# Patient Record
Sex: Male | Born: 1960 | Race: White | Hispanic: No | Smoking: Former smoker
Health system: Southern US, Community
[De-identification: ages and names within clinical notes are randomized; demographics above are authoritative.]

## PROBLEM LIST (undated history)

## (undated) DIAGNOSIS — R569 Unspecified convulsions: Secondary | ICD-10-CM

## (undated) DIAGNOSIS — F419 Anxiety disorder, unspecified: Secondary | ICD-10-CM

## (undated) DIAGNOSIS — Z720 Tobacco use: Secondary | ICD-10-CM

## (undated) DIAGNOSIS — J189 Pneumonia, unspecified organism: Secondary | ICD-10-CM

## (undated) DIAGNOSIS — R5382 Chronic fatigue, unspecified: Secondary | ICD-10-CM

## (undated) DIAGNOSIS — J40 Bronchitis, not specified as acute or chronic: Secondary | ICD-10-CM

## (undated) DIAGNOSIS — IMO0001 Reserved for inherently not codable concepts without codable children: Secondary | ICD-10-CM

## (undated) DIAGNOSIS — F329 Major depressive disorder, single episode, unspecified: Secondary | ICD-10-CM

## (undated) DIAGNOSIS — I10 Essential (primary) hypertension: Secondary | ICD-10-CM

## (undated) DIAGNOSIS — C3490 Malignant neoplasm of unspecified part of unspecified bronchus or lung: Secondary | ICD-10-CM

## (undated) HISTORY — PX: VASECTOMY: SHX75

## (undated) HISTORY — PX: HERNIA REPAIR: SHX51

## (undated) HISTORY — DX: Malignant neoplasm of unspecified part of unspecified bronchus or lung: C34.90

## (undated) HISTORY — DX: Chronic fatigue, unspecified: R53.82

## (undated) HISTORY — DX: Major depressive disorder, single episode, unspecified: F32.9

## (undated) HISTORY — DX: Essential (primary) hypertension: I10

---

## 2014-12-12 ENCOUNTER — Emergency Department (HOSPITAL_COMMUNITY): Payer: Managed Care, Other (non HMO)

## 2014-12-12 ENCOUNTER — Inpatient Hospital Stay (HOSPITAL_COMMUNITY)
Admission: EM | Admit: 2014-12-12 | Discharge: 2014-12-16 | DRG: 987 | Disposition: A | Discharge status: Home / Self Care | Payer: Managed Care, Other (non HMO) | Attending: Internal Medicine | Admitting: Internal Medicine

## 2014-12-12 ENCOUNTER — Encounter (HOSPITAL_COMMUNITY): Payer: Self-pay | Admitting: Family Medicine

## 2014-12-12 DIAGNOSIS — R634 Abnormal weight loss: Secondary | ICD-10-CM | POA: Diagnosis present

## 2014-12-12 DIAGNOSIS — R918 Other nonspecific abnormal finding of lung field: Secondary | ICD-10-CM | POA: Diagnosis not present

## 2014-12-12 DIAGNOSIS — Z6821 Body mass index (BMI) 21.0-21.9, adult: Secondary | ICD-10-CM

## 2014-12-12 DIAGNOSIS — F17210 Nicotine dependence, cigarettes, uncomplicated: Secondary | ICD-10-CM | POA: Diagnosis present

## 2014-12-12 DIAGNOSIS — R531 Weakness: Secondary | ICD-10-CM | POA: Diagnosis present

## 2014-12-12 DIAGNOSIS — Z720 Tobacco use: Secondary | ICD-10-CM | POA: Diagnosis not present

## 2014-12-12 DIAGNOSIS — G4089 Other seizures: Secondary | ICD-10-CM | POA: Diagnosis present

## 2014-12-12 DIAGNOSIS — C7931 Secondary malignant neoplasm of brain: Secondary | ICD-10-CM | POA: Diagnosis present

## 2014-12-12 DIAGNOSIS — C3432 Malignant neoplasm of lower lobe, left bronchus or lung: Secondary | ICD-10-CM | POA: Diagnosis present

## 2014-12-12 DIAGNOSIS — G936 Cerebral edema: Secondary | ICD-10-CM | POA: Diagnosis present

## 2014-12-12 DIAGNOSIS — F411 Generalized anxiety disorder: Secondary | ICD-10-CM | POA: Diagnosis not present

## 2014-12-12 DIAGNOSIS — R911 Solitary pulmonary nodule: Secondary | ICD-10-CM | POA: Diagnosis not present

## 2014-12-12 DIAGNOSIS — G8191 Hemiplegia, unspecified affecting right dominant side: Secondary | ICD-10-CM | POA: Diagnosis present

## 2014-12-12 DIAGNOSIS — G939 Disorder of brain, unspecified: Secondary | ICD-10-CM | POA: Diagnosis not present

## 2014-12-12 DIAGNOSIS — F17200 Nicotine dependence, unspecified, uncomplicated: Secondary | ICD-10-CM | POA: Diagnosis not present

## 2014-12-12 DIAGNOSIS — Z9889 Other specified postprocedural states: Secondary | ICD-10-CM

## 2014-12-12 DIAGNOSIS — M6289 Other specified disorders of muscle: Secondary | ICD-10-CM | POA: Diagnosis not present

## 2014-12-12 LAB — CBC
HCT: 46.3 % (ref 39.0–52.0)
HEMOGLOBIN: 16 g/dL (ref 13.0–17.0)
MCH: 34 pg (ref 26.0–34.0)
MCHC: 34.6 g/dL (ref 30.0–36.0)
MCV: 98.5 fL (ref 78.0–100.0)
Platelets: 290 10*3/uL (ref 150–400)
RBC: 4.7 MIL/uL (ref 4.22–5.81)
RDW: 13.9 % (ref 11.5–15.5)
WBC: 10.3 10*3/uL (ref 4.0–10.5)

## 2014-12-12 LAB — COMPREHENSIVE METABOLIC PANEL
ALBUMIN: 3.7 g/dL (ref 3.5–5.0)
ALK PHOS: 80 U/L (ref 38–126)
ALT: 22 U/L (ref 17–63)
ANION GAP: 8 (ref 5–15)
AST: 34 U/L (ref 15–41)
BILIRUBIN TOTAL: 0.7 mg/dL (ref 0.3–1.2)
BUN: 14 mg/dL (ref 6–20)
CALCIUM: 9.1 mg/dL (ref 8.9–10.3)
CO2: 26 mmol/L (ref 22–32)
Chloride: 104 mmol/L (ref 101–111)
Creatinine, Ser: 0.91 mg/dL (ref 0.61–1.24)
Glucose, Bld: 106 mg/dL — ABNORMAL HIGH (ref 65–99)
POTASSIUM: 4.1 mmol/L (ref 3.5–5.1)
Sodium: 138 mmol/L (ref 135–145)
TOTAL PROTEIN: 6.8 g/dL (ref 6.5–8.1)

## 2014-12-12 LAB — DIFFERENTIAL
Basophils Absolute: 0 10*3/uL (ref 0.0–0.1)
Basophils Relative: 0 %
EOS ABS: 0.2 10*3/uL (ref 0.0–0.7)
EOS PCT: 2 %
LYMPHS ABS: 3 10*3/uL (ref 0.7–4.0)
LYMPHS PCT: 29 %
MONO ABS: 1 10*3/uL (ref 0.1–1.0)
MONOS PCT: 10 %
NEUTROS PCT: 59 %
Neutro Abs: 6.1 10*3/uL (ref 1.7–7.7)

## 2014-12-12 LAB — PROTIME-INR
INR: 0.96 (ref 0.00–1.49)
Prothrombin Time: 13 seconds (ref 11.6–15.2)

## 2014-12-12 LAB — APTT: aPTT: 27 seconds (ref 24–37)

## 2014-12-12 MED ORDER — SODIUM CHLORIDE 0.9 % IJ SOLN
3.0000 mL | Freq: Two times a day (BID) | INTRAMUSCULAR | Status: DC
  Administered 2014-12-13 – 2014-12-15 (×7): 3 mL via INTRAVENOUS

## 2014-12-12 MED ORDER — DEXAMETHASONE SODIUM PHOSPHATE 10 MG/ML IJ SOLN
4.0000 mg | Freq: Four times a day (QID) | INTRAMUSCULAR | Status: DC
  Administered 2014-12-13 – 2014-12-16 (×14): 4 mg via INTRAVENOUS
  Filled 2014-12-12 (×14): qty 1

## 2014-12-12 MED ORDER — ACETAMINOPHEN 650 MG RE SUPP: 650.0000 mg | Freq: Four times a day (QID) | RECTAL | Status: DC | PRN

## 2014-12-12 MED ORDER — LORAZEPAM 2 MG/ML IJ SOLN
1.0000 mg | Freq: Once | INTRAMUSCULAR | Status: AC
  Administered 2014-12-12: 1 mg via INTRAVENOUS
  Filled 2014-12-12: qty 1

## 2014-12-12 MED ORDER — ONDANSETRON HCL 4 MG/2ML IJ SOLN: 4.0000 mg | Freq: Four times a day (QID) | INTRAMUSCULAR | Status: DC | PRN

## 2014-12-12 MED ORDER — DEXAMETHASONE SODIUM PHOSPHATE 10 MG/ML IJ SOLN
10.0000 mg | Freq: Once | INTRAMUSCULAR | Status: AC
  Administered 2014-12-12: 10 mg via INTRAVENOUS
  Filled 2014-12-12: qty 1

## 2014-12-12 MED ORDER — PNEUMOCOCCAL VAC POLYVALENT 25 MCG/0.5ML IJ INJ
0.5000 mL | INJECTION | INTRAMUSCULAR | Status: DC
  Filled 2014-12-12: qty 0.5

## 2014-12-12 MED ORDER — NICOTINE 21 MG/24HR TD PT24
21.0000 mg | MEDICATED_PATCH | Freq: Every day | TRANSDERMAL | Status: DC
  Administered 2014-12-13 – 2014-12-16 (×5): 21 mg via TRANSDERMAL
  Filled 2014-12-12 (×5): qty 1

## 2014-12-12 MED ORDER — BARIUM SULFATE 2.1 % PO SUSP
900.0000 mL | Freq: Once | ORAL | Status: AC
  Administered 2014-12-13: 900 mL via ORAL

## 2014-12-12 MED ORDER — ONDANSETRON HCL 4 MG PO TABS: 4.0000 mg | ORAL_TABLET | Freq: Four times a day (QID) | ORAL | Status: DC | PRN

## 2014-12-12 MED ORDER — SODIUM CHLORIDE 0.9 % IV SOLN
1000.0000 mg | Freq: Once | INTRAVENOUS | Status: AC
  Administered 2014-12-12: 1000 mg via INTRAVENOUS
  Filled 2014-12-12: qty 10

## 2014-12-12 MED ORDER — LEVETIRACETAM 750 MG PO TABS
750.0000 mg | ORAL_TABLET | Freq: Two times a day (BID) | ORAL | Status: DC
  Administered 2014-12-13 – 2014-12-16 (×8): 750 mg via ORAL
  Filled 2014-12-12 (×8): qty 1

## 2014-12-12 MED ORDER — ACETAMINOPHEN 325 MG PO TABS: 650.0000 mg | ORAL_TABLET | Freq: Four times a day (QID) | ORAL | Status: DC | PRN

## 2014-12-12 MED ORDER — INFLUENZA VAC SPLIT QUAD 0.5 ML IM SUSY
0.5000 mL | PREFILLED_SYRINGE | INTRAMUSCULAR | Status: DC
  Filled 2014-12-12: qty 0.5

## 2014-12-12 MED ORDER — BARIUM SULFATE 2.1 % PO SUSP
ORAL | Status: AC
  Administered 2014-12-13: 02:00:00
  Filled 2014-12-12: qty 2

## 2014-12-12 MED ORDER — GADOBENATE DIMEGLUMINE 529 MG/ML IV SOLN
15.0000 mL | Freq: Once | INTRAVENOUS | Status: AC | PRN
  Administered 2014-12-12: 15 mL via INTRAVENOUS

## 2014-12-12 NOTE — H&P (Signed)
PCP: Sadie Haber at [facility]   Referring provider: Billy Fischer   Chief Complaint:  Right side weakness  HPI: Timothy Obrien is a 54 y.o. male   has no past medical history on file.   Presented with  His morning he had at least 2 minute long episode during which she had weakness of his right arm and leg. This occurred at around 1:30 AM. He was getting out of the shower at the time and his right leg would not hold his weight he colupsed on the floor. He has felt that his leg has been dragging somewhat for few weeks. Of note he reports that even a week ago he had somewhat similar episode but resolved. He did notice in the past that he may have some mild weakness especially pronounced when he was walking stairs he also has endorsed some tremors mainly involving his right arm and leg this was on November 21. Patient is a smoker. Denies any fevers nausea vomiting chest pain shortness of breath. Patient originally presented to emergency department for evaluation for possible stroke CT scan of the head showed large left frontal lobe low density mass with surrounding visit genic edema. Mostly consistent ciliated metastasis. There was no evidence of stroke. 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 Neurosurgery has been consulted recommends initiation of Keppra after 1 g IV bolus and started on 750 mg by mouth twice a day and Decadron 10 mg IV once than 4 mg every 6 hours.  Reports no colonoscopy. Patient had some loss of appetite for few months with 20 lb weight loss thought it was due to being layed off. He has mild chronic cough. 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  Review of Systems:    Pertinent positives include: Right-sided weakness, weight loss 20 pounds  Constitutional:  No weight loss, night sweats, Fevers, chills, fatigue,   HEENT:  No headaches,  Difficulty swallowing,Tooth/dental problems,Sore throat,  No sneezing, itching, ear ache, nasal congestion, post nasal drip,  Cardio-vascular:  No chest pain, Orthopnea, PND, anasarca, dizziness, palpitations.no Bilateral lower extremity swelling  GI:  No heartburn, indigestion, abdominal pain, nausea, vomiting, diarrhea, change in bowel habits, loss of appetite, melena, blood in stool, hematemesis Resp:  no shortness of breath at rest. No dyspnea on exertion, No excess mucus, no productive cough, No non-productive cough, No coughing up of blood.No change in color of mucus.No wheezing. Skin:  no rash or lesions. No jaundice GU:  no dysuria, change in color of urine, no urgency or frequency. No straining to urinate.  No flank pain.  Musculoskeletal:  No joint pain or no joint swelling. No decreased range of motion. No back pain.  Psych:  No change in mood or affect. No depression or anxiety. No memory loss.  Neuro: no localizing neurological complaints, no tingling, no weakness, no double vision, no gait abnormality, no slurred speech, no confusion  Otherwise ROS are negative except for above, 10 systems were reviewed  Past Medical History: History reviewed. No pertinent past medical history. Past Surgical History  Procedure Laterality Date  . Hernia repair       Medications: Prior to Admission medications   Medication Sig Start Date End Date Taking? Authorizing Provider  ibuprofen (ADVIL,MOTRIN) 200 MG tablet Take 400 mg by mouth every 6 (six) hours as needed.   Yes Historical Provider, MD  Allergies:  No Known Allergies  Social History:  Ambulatory   Independently  Lives at home  alone     reports that he has been smoking Cigarettes.  He does not have any smokeless tobacco history on file. He reports that he drinks alcohol. He reports that he does not use illicit drugs.    Family History: family history includes Arthritis in his father; Breast cancer in his mother.      Physical Exam: Patient Vitals for the past 24 hrs:  BP Temp Pulse Resp SpO2 Height Weight  12/12/14 1930 119/93 mmHg - 84 17 95 % - -  12/12/14 1915 109/83 mmHg - 67 18 95 % - -  12/12/14 1912 107/79 mmHg - 67 21 95 % - -  12/12/14 1900 113/74 mmHg - 69 23 94 % - -  12/12/14 1815 121/82 mmHg - - 20 95 % - -  12/12/14 1645 114/65 mmHg - 72 21 92 % - -  12/12/14 1630 118/76 mmHg - 75 23 90 % - -  12/12/14 1615 125/81 mmHg - 78 14 96 % - -  12/12/14 1600 125/75 mmHg - 74 18 94 % - -  12/12/14 1554 124/84 mmHg - 77 18 95 % - -  12/12/14 1311 124/87 mmHg 97.9 F (36.6 C) 91 18 98 % 6' (1.829 m) 71.923 kg (158 lb 9 oz)    1. General:  in No Acute distress 2. Psychological: Alert and  Oriented 3. Head/ENT:    Dry Mucous Membranes                          Head Non traumatic, neck supple                          Normal   Dentition 4. SKIN:  decreased Skin turgor,  Skin clean Dry and intact no rash 5. Heart: Regular rate and rhythm no Murmur, Rub or gallop 6. Lungs:  no wheezes some crackles  diminished at the bases 7. Abdomen: Soft, non-tender, Non distended 8. Lower extremities: no clubbing, cyanosis, or edema 9. Neurologically somewhat decreased strength on the right cranial nerves II through XII intact. Right pronator drift.  10. MSK: Normal range of motion  body mass index is 21.5 kg/(m^2).   Labs on Admission:   Results for orders placed or performed during the hospital encounter of 12/12/14 (from the past 24 hour(s))  Protime-INR     Status: None   Collection Time: 12/12/14  2:59 PM  Result Value Ref Range   Prothrombin Time 13.0 11.6 - 15.2 seconds   INR 0.96 0.00 - 1.49  APTT     Status: None   Collection Time: 12/12/14  2:59 PM  Result Value Ref Range   aPTT 27 24 - 37 seconds  CBC     Status: None   Collection Time: 12/12/14  2:59 PM  Result Value Ref Range   WBC 10.3 4.0 - 10.5 K/uL   RBC 4.70 4.22 - 5.81 MIL/uL   Hemoglobin 16.0 13.0 - 17.0 g/dL   HCT 46.3  39.0 - 52.0 %   MCV 98.5 78.0 - 100.0 fL   MCH 34.0 26.0 - 34.0 pg   MCHC 34.6 30.0 - 36.0 g/dL   RDW 13.9 11.5 - 15.5 %   Platelets 290 150 - 400 K/uL  Differential     Status: None   Collection Time: 12/12/14  2:59 PM  Result Value Ref Range   Neutrophils Relative % 59 %   Neutro Abs 6.1 1.7 - 7.7 K/uL   Lymphocytes Relative 29 %   Lymphs Abs 3.0 0.7 - 4.0 K/uL   Monocytes Relative 10 %   Monocytes Absolute 1.0 0.1 - 1.0 K/uL   Eosinophils Relative 2 %   Eosinophils Absolute 0.2 0.0 - 0.7 K/uL   Basophils Relative 0 %   Basophils Absolute 0.0 0.0 - 0.1 K/uL  Comprehensive metabolic panel     Status: Abnormal   Collection Time: 12/12/14  2:59 PM  Result Value Ref Range   Sodium 138 135 - 145 mmol/L   Potassium 4.1 3.5 - 5.1 mmol/L   Chloride 104 101 - 111 mmol/L   CO2 26 22 - 32 mmol/L   Glucose, Bld 106 (H) 65 - 99 mg/dL   BUN 14 6 - 20 mg/dL   Creatinine, Ser 0.91 0.61 - 1.24 mg/dL   Calcium 9.1 8.9 - 10.3 mg/dL   Total Protein 6.8 6.5 - 8.1 g/dL   Albumin 3.7 3.5 - 5.0 g/dL   AST 34 15 - 41 U/L   ALT 22 17 - 63 U/L   Alkaline Phosphatase 80 38 - 126 U/L   Total Bilirubin 0.7 0.3 - 1.2 mg/dL   GFR calc non Af Amer >60 >60 mL/min   GFR calc Af Amer >60 >60 mL/min   Anion gap 8 5 - 15    UA not obtained  No results found for: HGBA1C  Estimated Creatinine Clearance: 96.6 mL/min (by C-G formula based on Cr of 0.91).  BNP (last 3 results) No results for input(s): PROBNP in the last 8760 hours.  Other results:  I have pearsonaly reviewed this: ECG REPORT  Rate: 77  Rhythm: Sinus rhythm ST&T Change: No ischemic changes QTC 439  Filed Weights   12/12/14 1311  Weight: 71.923 kg (158 lb 9 oz)     Cultures: No results found for: SDES, SPECREQUEST, CULT, REPTSTATUS   Radiological Exams on Admission: Ct Head Wo Contrast  12/12/2014  CLINICAL DATA:  54 year old with right arm and leg numbness and weakness today. Initial encounter. EXAM: CT HEAD WITHOUT  CONTRAST TECHNIQUE: Contiguous axial images were obtained from the base of the skull through the vertex without intravenous contrast. COMPARISON:  None. FINDINGS: There is a 3.0 x 2.7 cm well-circumscribed low-density left frontal lobe mass with moderate surrounding vasogenic edema. This mass demonstrates no evidence of hemorrhage or calcification. There is no associated midline shift or hydrocephalus. No other masses are identified. There is no evidence of acute stroke. The visualized paranasal sinuses, mastoid air cells and middle ears are otherwise clear. The calvarium is intact. IMPRESSION: 1. Large left frontal lobe low-density mass with surrounding vasogenic edema. This appearance is most consistent with an isolated metastasis. Additional considerations include primary brain tumor and cerebral abscess. 2. No evidence of acute intracranial hemorrhage, midline shift, hydrocephalus or acute stroke. 3. Further evaluation with MRI without and with contrast recommended. 4. These results were called by telephone at the time of interpretation on 12/12/2014 at 2:00 pm to Dr. Sherwood Gambler , who verbally acknowledged these results. Electronically Signed   By: Richardean Sale M.D.   On: 12/12/2014 14:07   Mr [name] AT Contrast  12/12/2014  CLINICAL DATA:  Transient right-sided symptoms which have become worse, now with right-sided weakness. History of right-sided twitching. EXAM: MRI HEAD WITHOUT AND WITH CONTRAST TECHNIQUE: Multiplanar, multiecho pulse sequences of  the brain and surrounding structures were obtained without and with intravenous contrast. CONTRAST:  40m MULTIHANCE GADOBENATE DIMEGLUMINE 529 MG/ML IV SOLN COMPARISON:  CT head earlier today. FINDINGS: A superficial, LEFT posterior frontal intra-axial mass is redemonstrated. Measurements are 30 x 32 x 36 mm (R-L x A-P x C-C). The lesion shows no diffusion restriction, and no significant susceptibility except perhaps a tiny area on its most superficial  aspect. The lesion is T2 and FLAIR hyperintense, displays prominent T1 shortening, and is surrounded by a thin but somewhat irregular enhancing rim. There is mild vasogenic edema. There is no significant left-to-right shift.No other similar lesions are seen. Otherwise normal-appearing brain. Normal cerebral volume without significant white matter disease. Flow voids are maintained throughout the major intracranial vessels. No midline abnormality. No osseous findings. Extracranial soft tissues unremarkable. IMPRESSION: LEFT posterior frontal superficial intra-axial mass with peripheral enhancement, measuring 30 x 32 x 36 mm. The lesion appears cystic on CT, but displays prominent T1 shortening centrally on MR, suggesting mucin or proteinaceous content. No diffusion restriction to suggest abscess. A solitary metastasis is favored, possibly of endocrine or gastrointestinal origin. Given the lack of susceptibility on gradient sequence, melanoma is less likely but not excluded. Electronically Signed   By: JStaci RighterM.D.   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   Present on Admission:  . Metastasis to brain ([facility] - will need father workup for primary source obtain CT abdomen and chest and pelvis.  Possible source as well as most appropriate biopsy site. Continue dexamethasone 4 milligrams every 6 hours and Keppra 750 mg twice a day  . Right sided weakness -PT/OT evaluation and treatment once stable Alcohol use heavy no hx of withdrawal but would monitor Tobacco abuse - nicotine patch, discussed importance of quiting Prophylaxis: SCD   CODE STATUS:  FULL CODE   as per patient    Disposition: To home once workup is complete and patient is stable   Other plan as per orders.  I have spent a total of 55 min on this admission  Ishmel Acevedo 12/12/2014, 7:46 PM  [facility]  Pager [phone]  after 2 AM please page floor coverage PA If 7AM-7PM, please contact the day team taking care of the patient  [email or web address]  Password [email or web address]

## 2014-12-12 NOTE — Consult Note (Signed)
CC:  Chief Complaint  Patient presents with  . Numbness  . Extremity Weakness    HPI: Timothy Obrien is a 54 y.o. male male who had a simple partial seizure affecting his right arm and leg last night.  He has recovered but still is subtly weak on that side.  He hasn't had any headaches.  No visual changes.  No speech or language problems.  He is a smoker and has lost 20 lbs in the past several months.  PMH: History reviewed. No pertinent past medical history.  PSH: Past Surgical History  Procedure Laterality Date  . Hernia repair      SH: Social History  Substance Use Topics  . Smoking status: Current Every Day Smoker    Types: Cigarettes  . Smokeless tobacco: None  . Alcohol Use: Yes    MEDS: Prior to Admission medications   Medication Sig Start Date End Date Taking? Authorizing Provider  ibuprofen (ADVIL,MOTRIN) 200 MG tablet Take 400 mg by mouth every 6 (six) hours as needed.   Yes Historical Provider, MD    ALLERGY: No Known Allergies  ROS: ROS  NEUROLOGIC EXAM: Awake, alert, oriented Memory and concentration grossly intact Speech fluent, appropriate CN grossly intact Motor exam: Upper Extremities Deltoid Bicep Tricep Grip  Right 4/5 4/5 4/5 4/5  Left 5/5 5/5 5/5 5/5   Lower Extremity IP Quad PF DF EHL  Right 4/5 4/5 4/5 4/5 4/5  Left 5/5 5/5 5/5 5/5 5/5   Sensation grossly intact to LT  IMGAING: CT Head: Left frontal cystic mass with vasogenic edema MRI Brain: Left frontal cystic mass with rim enhancement.  No DWI restriction to suggest abscess.  IMPRESSION: - 54 y.o. male with left frontal cystic mass and right hemiparesis.  I suspect this is a metastatic lesion.  I explained the patient and his wife.  PLAN: - Admit to Hospitalist for CT Chest/Abd/Pelvis with contrast.  If primary identified then recommend biopsy. - Will plan resection vs. Radiation vs. Both depending on results of CT. - Dexamethasone 10 mg IV now then 4q6. - Keppra 750  mg PO/IV bid

## 2014-12-12 NOTE — ED Notes (Signed)
Pt back from MRI. Neuro at bedside.

## 2014-12-12 NOTE — ED Notes (Signed)
Pt taken to MRI  

## 2014-12-12 NOTE — ED Notes (Signed)
Dr. Sherwood Gambler at bedside

## 2014-12-12 NOTE — ED Notes (Addendum)
Pt here for 2 minute episode of right arm and leg weakness around 1:30 am this morning. sts he collapsed and felt pins an needles on right side. sts today feels like his right leg may be dragging. sts similar episode last week while traveling to his moms for thanksgiving.

## 2014-12-12 NOTE — ED Notes (Signed)
Attempted to call report

## 2014-12-12 NOTE — ED Provider Notes (Signed)
CSN: [id number]     Arrival date & time 12/12/14  1258 History   First MD Initiated Contact with Patient 12/12/14 1424     Chief Complaint  Patient presents with  . Numbness  . Extremity Weakness     (Consider location/radiation/quality/duration/timing/severity/associated sxs/prior Treatment) HPI  54 year old male presents with a concern for a TIA. Last night he was getting up out of the bathroom and fell and was unable to get off of the floor due to right arm and leg weakness. This seemed to last only a few minutes and finally he was able to get up. However today he has noticed pins and needles on his right side as well as that his right leg seems to be dragging. Denies any headaches. Now the family lives back on a he may have had some mild weakness, especially when going up stairs over the last week or so. When he was driving to his moms house for Thanksgiving one week ago he noticed some uncontrollable shaking in his right arm and leg. This lasted about one-2 minutes. Denies any sort of medical history. No fevers. No left-sided symptoms.  History reviewed. No pertinent past medical history. Past Surgical History  Procedure Laterality Date  . Hernia repair     History reviewed. No pertinent family history. Social History  Substance Use Topics  . Smoking status: Current Every Day Smoker    Types: Cigarettes  . Smokeless tobacco: None  . Alcohol Use: Yes    Review of Systems  Eyes: Negative for visual disturbance.  Respiratory: Negative for shortness of breath.   Cardiovascular: Negative for chest pain.  Gastrointestinal: Negative for vomiting.  Neurological: Positive for weakness and numbness. Negative for dizziness, syncope and headaches.  All other systems reviewed and are negative.     Allergies  Review of patient's allergies indicates no known allergies.  Home Medications   Prior to Admission medications   Not on File   BP 124/87 mmHg  Pulse 91  Temp(Src) 97.9 F  (36.6 C)  Resp 18  Ht 6' (1.829 m)  Wt 158 lb 9 oz (71.923 kg)  BMI 21.50 kg/m2  SpO2 98% Physical Exam  Constitutional: He is oriented to person, place, and time. He appears well-developed and well-nourished.  HENT:  Head: Normocephalic and atraumatic.  Right Ear: External ear normal.  Left Ear: External ear normal.  Nose: Nose normal.  Eyes: EOM are normal. Pupils are equal, round, and reactive to light. Right eye exhibits no discharge. Left eye exhibits no discharge.  Neck: Neck supple.  Cardiovascular: Normal rate, regular rhythm, normal heart sounds and intact distal pulses.   Pulmonary/Chest: Effort normal and breath sounds normal.  Abdominal: Soft. There is no tenderness.  Musculoskeletal: He exhibits no edema.  Neurological: He is alert and oriented to person, place, and time.  CN 2-12 grossly intact. 5/5 strength in all 4 extremities but slightly weaker right sided.  Skin: Skin is warm and dry.  Nursing note and vitals reviewed.   ED Course  Procedures (including critical care time) Labs Review Labs Reviewed  COMPREHENSIVE METABOLIC PANEL - Abnormal; Notable for the following:    Glucose, Bld 106 (*)    All other components within normal limits  PROTIME-INR  APTT  CBC  DIFFERENTIAL  I-STAT TROPOININ, ED  I-STAT CHEM 8, ED  CBG MONITORING, ED    Imaging Review Ct Head Wo Contrast  12/12/2014  CLINICAL DATA:  54 year old with right arm and leg numbness and weakness today.  Initial encounter. EXAM: CT HEAD WITHOUT CONTRAST TECHNIQUE: Contiguous axial images were obtained from the base of the skull through the vertex without intravenous contrast. COMPARISON:  None. FINDINGS: There is a 3.0 x 2.7 cm well-circumscribed low-density left frontal lobe mass with moderate surrounding vasogenic edema. This mass demonstrates no evidence of hemorrhage or calcification. There is no associated midline shift or hydrocephalus. No other masses are identified. There is no evidence of  acute stroke. The visualized paranasal sinuses, mastoid air cells and middle ears are otherwise clear. The calvarium is intact. IMPRESSION: 1. Large left frontal lobe low-density mass with surrounding vasogenic edema. This appearance is most consistent with an isolated metastasis. Additional considerations include primary brain tumor and cerebral abscess. 2. No evidence of acute intracranial hemorrhage, midline shift, hydrocephalus or acute stroke. 3. Further evaluation with MRI without and with contrast recommended. 4. These results were called by telephone at the time of interpretation on 12/12/2014 at 2:00 pm to Dr. Sherwood Gambler , who verbally acknowledged these results. Electronically Signed   By: Richardean Sale M.D.   On: 12/12/2014 14:07   I have personally reviewed and evaluated these images and lab results as part of my medical decision-making.   EKG Interpretation   Date/Time:  Friday December 12 2014 14:55:39 EST Ventricular Rate:  77 PR Interval:  176 QRS Duration: 91 QT Interval:  388 QTC Calculation: 439 R Axis:   86 Text Interpretation:  Sinus rhythm Normal ECG No old tracing to compare  Confirmed by Annelie Boak  MD, Yashira Offenberger ([id number]) on 12/12/2014 4:01:57 PM      MDM   Final diagnoses:  None    Patient's CT shows a large frontal mass with some edema. Patient is awake and alert and at his normal baseline. Mild right-sided weakness noted. Discussed CT results with neurosurgery, Dr. Cyndy Freeze, who recommends Keppra 1 g IV at this time, no further treatment until MRI which will hopefully delineate exactly what is going on. Neurosurgery will need to be reconsulted after MRI is finished. Care transferred to Dr. Billy Fischer with MRI pending    Sherwood Gambler, MD 12/12/14 [phone]

## 2014-12-13 ENCOUNTER — Inpatient Hospital Stay (HOSPITAL_COMMUNITY): Payer: Managed Care, Other (non HMO)

## 2014-12-13 DIAGNOSIS — Z720 Tobacco use: Secondary | ICD-10-CM

## 2014-12-13 DIAGNOSIS — C7931 Secondary malignant neoplasm of brain: Principal | ICD-10-CM

## 2014-12-13 DIAGNOSIS — M6289 Other specified disorders of muscle: Secondary | ICD-10-CM

## 2014-12-13 LAB — COMPREHENSIVE METABOLIC PANEL
ALT: 19 U/L (ref 17–63)
AST: 27 U/L (ref 15–41)
Albumin: 3.1 g/dL — ABNORMAL LOW (ref 3.5–5.0)
Alkaline Phosphatase: 71 U/L (ref 38–126)
Anion gap: 8 (ref 5–15)
BUN: 13 mg/dL (ref 6–20)
CHLORIDE: 105 mmol/L (ref 101–111)
CO2: 24 mmol/L (ref 22–32)
CREATININE: 0.92 mg/dL (ref 0.61–1.24)
Calcium: 8.9 mg/dL (ref 8.9–10.3)
GFR calc Af Amer: >60 mL/min (ref >60)
GFR calc non Af Amer: >60 mL/min (ref >60)
Glucose, Bld: 128 mg/dL — ABNORMAL HIGH (ref 65–99)
POTASSIUM: 4.7 mmol/L (ref 3.5–5.1)
SODIUM: 137 mmol/L (ref 135–145)
Total Bilirubin: 0.8 mg/dL (ref 0.3–1.2)
Total Protein: 6.3 g/dL — ABNORMAL LOW (ref 6.5–8.1)

## 2014-12-13 LAB — TSH: TSH: 0.744 u[IU]/mL (ref 0.350–4.500)

## 2014-12-13 LAB — CBC
HEMATOCRIT: 43 % (ref 39.0–52.0)
Hemoglobin: 14.5 g/dL (ref 13.0–17.0)
MCH: 33.4 pg (ref 26.0–34.0)
MCHC: 33.7 g/dL (ref 30.0–36.0)
MCV: 99.1 fL (ref 78.0–100.0)
PLATELETS: 271 10*3/uL (ref 150–400)
RBC: 4.34 MIL/uL (ref 4.22–5.81)
RDW: 13.9 % (ref 11.5–15.5)
WBC: 7.2 10*3/uL (ref 4.0–10.5)

## 2014-12-13 LAB — PROTIME-INR
INR: 1 (ref 0.00–1.49)
Prothrombin Time: 13.4 seconds (ref 11.6–15.2)

## 2014-12-13 LAB — PHOSPHORUS: PHOSPHORUS: 2.9 mg/dL (ref 2.5–4.6)

## 2014-12-13 LAB — MAGNESIUM: Magnesium: 2.3 mg/dL (ref 1.7–2.4)

## 2014-12-13 MED ORDER — IOHEXOL 300 MG/ML  SOLN
80.0000 mL | Freq: Once | INTRAMUSCULAR | Status: AC | PRN
  Administered 2014-12-13: 80 mL via INTRAVENOUS

## 2014-12-13 MED ORDER — CLONAZEPAM 0.5 MG PO TABS
0.5000 mg | ORAL_TABLET | Freq: Three times a day (TID) | ORAL | Status: DC | PRN
  Administered 2014-12-13 – 2014-12-16 (×7): 0.5 mg via ORAL
  Filled 2014-12-13 (×7): qty 1

## 2014-12-13 MED ORDER — ENSURE ENLIVE PO LIQD
237.0000 mL | Freq: Two times a day (BID) | ORAL | Status: DC
  Administered 2014-12-13 – 2014-12-16 (×4): 237 mL via ORAL
  Filled 2014-12-13 (×9): qty 237

## 2014-12-13 NOTE — Evaluation (Signed)
Physical Therapy Evaluation Patient Details Name: Timothy Obrien MRN: [id number] DOB: 03/05/1960 Today's Date: 12/13/2014   History of Present Illness  Adm with Rt sided weakness; CT head +Lt frontal cystic mass (consistent with metastasis); CT chest + lung mass. Awaiting bronchoscopy and further treatment options PMHx-none significant    Clinical Impression  Pt admitted with above diagnosis. Pt currently with functional limitations due to the deficits listed below (see PT Problem List). Although RLE weakness/decreased muscular endurance, pt is compensating well. Pt will benefit from skilled PT to increase their independence and safety with mobility to allow discharge to the venue listed below.       Follow Up Recommendations Outpatient PT (however this may not be his highest priority; he is compensating well and verbalizes safe techniques)    Equipment Recommendations  None recommended by PT    Recommendations for Other Services OT consult;Speech consult     Precautions / Restrictions Precautions Precautions: Fall      Mobility  Bed Mobility Overal bed mobility: Independent                Transfers Overall transfer level: Modified independent Equipment used: None             General transfer comment: Pt primarily shifted onto LLE to maintain balance  Ambulation/Gait Ambulation/Gait assistance: Supervision Ambulation Distance (Feet): 50 Feet (in room (bedrest with BRP order)) Assistive device: None Gait Pattern/deviations: Step-through pattern;Decreased dorsiflexion - right;Trendelenburg     General Gait Details: as RLE fatigues, begin to hear/see foot dragging; able to incr DF when focuses on heelstrike; no knee buckling or hyperextension noted  Stairs Stairs:  (discussed safest technique)          Wheelchair Mobility    Modified Rankin (Stroke Patients Only)       Balance Overall balance assessment: Modified Independent          Standing balance support: No upper extremity supported Standing balance-Leahy Scale: Good   Single Leg Stance - Right Leg: 3 Single Leg Stance - Left Leg: 15 Tandem Stance - Right Leg: 20 Tandem Stance - Left Leg: 20 Rhomberg - Eyes Opened: 30 Rhomberg - Eyes Closed: 30 High level balance activites: Direction changes;Turns               Pertinent Vitals/Pain Pain Assessment: No/denies pain    Home Living Family/patient expects to be discharged to:: Private residence Living Arrangements: Alone   Type of Home: House Home Access: Stairs to enter Entrance Stairs-Rails: None Entrance Stairs-Number of Steps: 2 Home Layout: Multi-level Home Equipment: None      Prior Function Level of Independence: Independent         Comments: recent fall out of shower     Hand Dominance        Extremity/Trunk Assessment   Upper Extremity Assessment: Defer to OT evaluation           Lower Extremity Assessment: RLE deficits/detail RLE Deficits / Details: AROM WFL; hip abd 3+, knee extension 4/5, dorsiflexion 4/5; however as pt fatigues and/or is distracted, RLE functions in a weaker state    Cervical / Trunk Assessment: Normal  Communication   Communication: No difficulties;Other (comment) (very "hyper," verbose, joking; daughter reports normal for h)  Cognition Arousal/Alertness: Awake/alert Behavior During Therapy: Anxious;Impulsive Overall Cognitive Status: Within Functional Limits for tasks assessed (dtrs report he is at baseline)  General Comments General comments (skin integrity, edema, etc.): 2 daughters present    Exercises Low Level/ICU Exercises Stabilized Bridging: Other (comment) (Educated in technique (pt wanting exercise until amb ok'd))      Assessment/Plan    PT Assessment Patient needs continued PT services  PT Diagnosis Difficulty walking;Hemiplegia dominant side   PT Problem List Decreased strength;Decreased  activity tolerance;Decreased balance;Decreased mobility;Decreased knowledge of use of DME  PT Treatment Interventions DME instruction;Gait training;Stair training;Functional mobility training;Therapeutic activities;Balance training;Neuromuscular re-education;Patient/family education   PT Goals (Current goals can be found in the Care Plan section) Acute Rehab PT Goals Patient Stated Goal: go home today PT Goal Formulation: With patient Time For Goal Achievement: 12/19/14 Potential to Achieve Goals: Good    Frequency Min 3X/week   Barriers to discharge        Co-evaluation               End of Session Equipment Utilized During Treatment: Gait belt Activity Tolerance: Patient tolerated treatment well Patient left: in bed;with call bell/phone within reach;with family/visitor present Nurse Communication: Mobility status;Other (comment) (?Ok to walk with family on unit)         Time: [phone] PT Time Calculation (min) (ACUTE ONLY): 22 min   Charges:   PT Evaluation $Initial PT Evaluation Tier I: 1 Procedure     PT G Codes:        Timothy Obrien 12-26-2014, 2:53 PM Pager [phone]

## 2014-12-13 NOTE — Progress Notes (Signed)
PROGRESS NOTE    Timothy Obrien NWG:[id number] DOB: 1960-06-03 DOA: 12/12/2014 PCP: Pcp Not In System  HPI/Brief narrative 54 year old male patient with no significant past medical history, ongoing tobacco abuse, presented to [facility] ED on 12/12/14 with approximately 10 days history of intermittent right-sided involuntary movements 2 and 5 days history of right lower extremity weakness. No headaches, visual symptoms, slurred speech or difficulty swallowing. CT and MRI brain confirmed brain mass suspicious for metastasis with vasogenic edema. CT chest abdomen and pelvis confirmed left lower lobe lung mass. Neurosurgery consulted. Pulmonology consulted for bronchoscopy biopsy.   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 Continue nicotine patch.   DVT prophylaxis: Subcutaneous heparin Code Status: Full Family Communication: Discussed with patient's ex-wife and 2 daughters at bedside after patient's consent. Disposition Plan: DC home when medically stable.   Consultants:  Neurosurgery  Pulmonology  Procedures:  None  Antibiotics:  None   Subjective: Persisting right-sided weakness. No further seizures. No headache.  Objective: Filed Vitals:   12/13/14 0328 12/13/14 0546 12/13/14 1000 12/13/14 1300  BP: 96/61 98/42 121/77   Pulse: 73 65 79   Temp: 97.6 F (36.4 C) 97.6 F (36.4 C) 98.7 F (37.1 C)   TempSrc: Oral Oral Oral Oral  Resp: '20 20 20 20  '$ Height:       Weight:      SpO2:  98% 97% 97%    Intake/Output Summary (Last 24 hours) at 12/13/14 1549 Last data filed at 12/12/14 2331  Gross per 24 hour  Intake   1000 ml  Output      0 ml  Net   1000 ml   Filed Weights   12/12/14 1311 12/12/14 2216  Weight: 71.923 kg (158 lb 9 oz) 71.215 kg (157 lb)     Exam:  General exam: Moderately built and thinly nourished pleasant young male lying comfortably in bed. Respiratory system: Clear. No increased work of breathing. Cardiovascular system: S1 & S2 heard, RRR. No JVD, murmurs, gallops, clicks or pedal edema. Gastrointestinal system: Abdomen is nondistended, soft and nontender. Normal bowel sounds heard. Central nervous system: Alert and oriented. No focal neurological deficits. Extremities: 5 x 5 power in left limbs and 4 x 5 power in right limbs, RLE weaker than RUE   Data Reviewed: Basic Metabolic Panel:  Recent Labs Lab 12/12/14 1459 12/13/14 0230  NA 138 137  K 4.1 4.7  CL 104 105  CO2 26 24  GLUCOSE 106* 128*  BUN 14 13  CREATININE 0.91 0.92  CALCIUM 9.1 8.9  MG  --  2.3  PHOS  --  2.9   Liver Function Tests:  Recent Labs Lab 12/12/14 1459 12/13/14 0230  AST 34 27  ALT 22 19  ALKPHOS 80 71  BILITOT 0.7 0.8  PROT 6.8 6.3*  ALBUMIN 3.7 3.1*   No results for input(s): LIPASE, AMYLASE in the last 168 hours. No results for input(s): AMMONIA in the last 168 hours. CBC:  Recent Labs Lab 12/12/14 1459 12/13/14  0230  WBC 10.3 7.2  NEUTROABS 6.1  --   HGB 16.0 14.5  HCT 46.3 43.0  MCV 98.5 99.1  PLT 290 271   Cardiac Enzymes: No results for input(s): CKTOTAL, CKMB, CKMBINDEX, TROPONINI in the last 168 hours. BNP (last 3 results) No results for input(s): PROBNP in the last 8760 hours. CBG: No results for input(s): GLUCAP in the last 168 hours.  No results found for this or any previous visit (from the past 240 hour(s)).         Studies: Ct Head Wo Contrast  12/12/2014  CLINICAL DATA:   54 year old with right arm and leg numbness and weakness today. Initial encounter. EXAM: CT HEAD WITHOUT CONTRAST TECHNIQUE: Contiguous axial images were obtained from the base of the skull through the vertex without intravenous contrast. COMPARISON:  None. FINDINGS: There is a 3.0 x 2.7 cm well-circumscribed low-density left frontal lobe mass with moderate surrounding vasogenic edema. This mass demonstrates no evidence of hemorrhage or calcification. There is no associated midline shift or hydrocephalus. No other masses are identified. There is no evidence of acute stroke. The visualized paranasal sinuses, mastoid air cells and middle ears are otherwise clear. The calvarium is intact. IMPRESSION: 1. Large left frontal lobe low-density mass with surrounding vasogenic edema. This appearance is most consistent with an isolated metastasis. Additional considerations include primary brain tumor and cerebral abscess. 2. No evidence of acute intracranial hemorrhage, midline shift, hydrocephalus or acute stroke. 3. Further evaluation with MRI without and with contrast recommended. 4. These results were called by telephone at the time of interpretation on 12/12/2014 at 2:00 pm to Dr. Sherwood Gambler , who verbally acknowledged these results. Electronically Signed   By: Richardean Sale M.D.   On: 12/12/2014 14:07   Ct Chest W Contrast  12/13/2014  CLINICAL DATA:  MRI showing a 3-4 cm intracranial mass favored to represent solitary metastasis, possibly endocrine or gastrointestinal origin. EXAM: CT CHEST, ABDOMEN, AND PELVIS WITH CONTRAST TECHNIQUE: Multidetector CT imaging of the chest, abdomen and pelvis was performed following the standard protocol during bolus administration of intravenous contrast. CONTRAST:  24m OMNIPAQUE IOHEXOL 300 MG/ML  SOLN COMPARISON:  None. FINDINGS: CT CHEST FINDINGS Mediastinum/Lymph Nodes: Scattered small lymph nodes within the mediastinum. No mass or enlarged lymph nodes within the  mediastinum or perihilar regions. Thoracic aorta is normal in caliber and configuration. Heart size is normal. No pericardial effusion. Lungs/Pleura: Masslike consolidation within the superior segment of the left lower lobe, measuring 3.7 x 3.7 cm. Emphysematous changes noted within the upper lobes bilaterally, moderate in degree. Mild atelectasis noted at each lung base. Musculoskeletal: No chest wall mass or suspicious bone lesions identified. CT ABDOMEN PELVIS FINDINGS Hepatobiliary: No masses or other significant abnormality. Pancreas: No mass, inflammatory changes, or other significant abnormality. Spleen: Within normal limits in size and appearance. Adrenals/Urinary Tract: 8 mm hypodense lesion within the superior pole of the right kidney, too small to characterize but most likely a small benign cyst. Kidneys otherwise unremarkable bilaterally without stone or hydronephrosis. Left adrenal gland is somewhat bulbous in configuration but without discrete mass. Right adrenal gland appears normal. Stomach/Bowel: Bowel is normal in caliber. No evidence of bowel wall thickening or bowel wall inflammation seen. Appendix is normal. Stomach is moderately distended with recently ingested materials but otherwise unremarkable. Vascular/Lymphatic: No pathologically enlarged lymph nodes. No evidence of abdominal aortic aneursym. Atherosclerotic changes are seen along the walls of the normal-caliber abdominal aorta. Reproductive: No mass or other significant abnormality. Other: No  free fluid or soft tissue mass is identified within the abdomen or pelvis. No free intraperitoneal air. Musculoskeletal: No suspicious bone lesions identified. Mild degenerative changes seen within the lumbar spine but no acute osseous abnormality. IMPRESSION: 1. Masslike consolidation within the superior segment of the left lower lobe, measuring 3.7 x 3.7 cm, highly suspicious for neoplastic pulmonary mass. 2. No other evidence of neoplastic  process seen within the chest, abdomen or pelvis. No acute findings. 3. 8 mm hypodense lesion within the superior pole of the right kidney, too small to characterize but most likely a small benign cyst. Given the presumed intracranial metastasis and today's masslike consolidation in the left lower lobe, would consider follow-up renal protocol CT or MRI at some point to ensure benignity. Electronically Signed   By: Franki Cabot M.D.   On: 12/13/2014 08:28   Mr [name] EN Contrast  12/12/2014  CLINICAL DATA:  Transient right-sided symptoms which have become worse, now with right-sided weakness. History of right-sided twitching. EXAM: MRI HEAD WITHOUT AND WITH CONTRAST TECHNIQUE: Multiplanar, multiecho pulse sequences of the brain and surrounding structures were obtained without and with intravenous contrast. CONTRAST:  54m MULTIHANCE GADOBENATE DIMEGLUMINE 529 MG/ML IV SOLN COMPARISON:  CT head earlier today. FINDINGS: A superficial, LEFT posterior frontal intra-axial mass is redemonstrated. Measurements are 30 x 32 x 36 mm (R-L x A-P x C-C). The lesion shows no diffusion restriction, and no significant susceptibility except perhaps a tiny area on its most superficial aspect. The lesion is T2 and FLAIR hyperintense, displays prominent T1 shortening, and is surrounded by a thin but somewhat irregular enhancing rim. There is mild vasogenic edema. There is no significant left-to-right shift.No other similar lesions are seen. Otherwise normal-appearing brain. Normal cerebral volume without significant white matter disease. Flow voids are maintained throughout the major intracranial vessels. No midline abnormality. No osseous findings. Extracranial soft tissues unremarkable. IMPRESSION: LEFT posterior frontal superficial intra-axial mass with peripheral enhancement, measuring 30 x 32 x 36 mm. The lesion appears cystic on CT, but displays prominent T1 shortening centrally on MR, suggesting mucin or proteinaceous  content. No diffusion restriction to suggest abscess. A solitary metastasis is favored, possibly of endocrine or gastrointestinal origin. Given the lack of susceptibility on gradient sequence, melanoma is less likely but not excluded. Electronically Signed   By: JStaci RighterM.D.   On: 12/12/2014 18:28   Ct Abdomen Pelvis W Contrast  12/13/2014  CLINICAL DATA:  MRI showing a 3-4 cm intracranial mass favored to represent solitary metastasis, possibly endocrine or gastrointestinal origin. EXAM: CT CHEST, ABDOMEN, AND PELVIS WITH CONTRAST TECHNIQUE: Multidetector CT imaging of the chest, abdomen and pelvis was performed following the standard protocol during bolus administration of intravenous contrast. CONTRAST:  868mOMNIPAQUE IOHEXOL 300 MG/ML  SOLN COMPARISON:  None. FINDINGS: CT CHEST FINDINGS Mediastinum/Lymph Nodes: Scattered small lymph nodes within the mediastinum. No mass or enlarged lymph nodes within the mediastinum or perihilar regions. Thoracic aorta is normal in caliber and configuration. Heart size is normal. No pericardial effusion. Lungs/Pleura: Masslike consolidation within the superior segment of the left lower lobe, measuring 3.7 x 3.7 cm. Emphysematous changes noted within the upper lobes bilaterally, moderate in degree. Mild atelectasis noted at each lung base. Musculoskeletal: No chest wall mass or suspicious bone lesions identified. CT ABDOMEN PELVIS FINDINGS Hepatobiliary: No masses or other significant abnormality. Pancreas: No mass, inflammatory changes, or other significant abnormality. Spleen: Within normal limits in size and appearance. Adrenals/Urinary Tract: 8 mm hypodense lesion within the  superior pole of the right kidney, too small to characterize but most likely a small benign cyst. Kidneys otherwise unremarkable bilaterally without stone or hydronephrosis. Left adrenal gland is somewhat bulbous in configuration but without discrete mass. Right adrenal gland appears normal.  Stomach/Bowel: Bowel is normal in caliber. No evidence of bowel wall thickening or bowel wall inflammation seen. Appendix is normal. Stomach is moderately distended with recently ingested materials but otherwise unremarkable. Vascular/Lymphatic: No pathologically enlarged lymph nodes. No evidence of abdominal aortic aneursym. Atherosclerotic changes are seen along the walls of the normal-caliber abdominal aorta. Reproductive: No mass or other significant abnormality. Other: No free fluid or soft tissue mass is identified within the abdomen or pelvis. No free intraperitoneal air. Musculoskeletal: No suspicious bone lesions identified. Mild degenerative changes seen within the lumbar spine but no acute osseous abnormality. IMPRESSION: 1. Masslike consolidation within the superior segment of the left lower lobe, measuring 3.7 x 3.7 cm, highly suspicious for neoplastic pulmonary mass. 2. No other evidence of neoplastic process seen within the chest, abdomen or pelvis. No acute findings. 3. 8 mm hypodense lesion within the superior pole of the right kidney, too small to characterize but most likely a small benign cyst. Given the presumed intracranial metastasis and today's masslike consolidation in the left lower lobe, would consider follow-up renal protocol CT or MRI at some point to ensure benignity. Electronically Signed   By: Franki Cabot M.D.   On: 12/13/2014 08:28        Scheduled Meds: . dexamethasone  4 mg Intravenous 4 times per day  . feeding supplement (ENSURE ENLIVE)  237 mL Oral BID BM  . Influenza vac split quadrivalent PF  0.5 mL Intramuscular Tomorrow-1000  . levETIRAcetam  750 mg Oral BID  . nicotine  21 mg Transdermal Daily  . pneumococcal 23 valent vaccine  0.5 mL Intramuscular Tomorrow-1000  . sodium chloride  3 mL Intravenous Q12H   Continuous Infusions:   Active Problems:   Metastasis to brain [facility])   Right sided weakness   Brain metastases ([place])   Acute right-sided  weakness    Time spent: 40 minutes.    Vernell Leep, MD, FACP, FHM. [facility] Pager [phone]  If 7PM-7AM, please contact night-coverage [email or web address] Password [email or web address] 12/13/2014, 3:49 PM    LOS: 1 day

## 2014-12-13 NOTE — Progress Notes (Signed)
CT shows left lung mass Patient agitated/anxious but neurologically stable Would benefit from anxiolytic, I have ordered this Recommend lung biopsy for tissue diagnosis to guide treatment decision of radiation + surgery vs. Radiation alone.

## 2014-12-13 NOTE — Progress Notes (Signed)
Initial Nutrition Assessment  DOCUMENTATION CODES:   Not applicable  INTERVENTION:   -Ensure Enlive po BID, each supplement provides 350 kcal and 20 grams of protein  NUTRITION DIAGNOSIS:   Predicted suboptimal nutrient intake related to catabolic illness as evidenced by  (lt lung mass, hx of weight loss).  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  Pt admitted with solitary tumor likely secondary to metastasis to the brain.   Pt with hx of heavy alcohol use and tobacco use; no hx of withdrawal, but staff is monitoring.   Per neurosurgery note, recommending lung biopsy (CT revealed left lung mass).   Nutrition-focused physical exam deferred secondary to agitation. Per H&P, pt endorsing a 20# wt loss over the past several months, which was attributed to stress. However, there is no weight hx of verify this claim. RD will add nutritional supplement to promote nutritional adequacy.   Labs reviewed.   Diet Order:  Diet regular Room service appropriate?: Yes; Fluid consistency:: Thin  Skin:  Reviewed, no issues  Last BM:  12/11/14  Height:   Ht Readings from Last 1 Encounters:  12/12/14 6' (1.829 m)    Weight:   Wt Readings from Last 1 Encounters:  12/12/14 157 lb (71.215 kg)    Ideal Body Weight:  80.9 kg  BMI:  Body mass index is 21.29 kg/(m^2).  Estimated Nutritional Needs:   Kcal:  1900-2100  Protein:  90-100 grams  Fluid:  1.9-2.1 L  EDUCATION NEEDS:   Education needs no appropriate at this time  Ravleen Ries A. Jimmye Norman, RD, LDN, CDE Pager: [phone] After hours Pager: [phone]

## 2014-12-14 ENCOUNTER — Encounter (HOSPITAL_COMMUNITY): Payer: Self-pay | Admitting: Pulmonary Disease

## 2014-12-14 DIAGNOSIS — G939 Disorder of brain, unspecified: Secondary | ICD-10-CM

## 2014-12-14 DIAGNOSIS — R918 Other nonspecific abnormal finding of lung field: Secondary | ICD-10-CM

## 2014-12-14 DIAGNOSIS — F17200 Nicotine dependence, unspecified, uncomplicated: Secondary | ICD-10-CM

## 2014-12-14 NOTE — Consult Note (Signed)
Name: Timothy Obrien MRN: [id number] DOB: [birth year]    ADMISSION DATE:  12/12/2014 CONSULTATION DATE:  12/14/2014  REFERRING MD :  Hospitalist Service  CHIEF COMPLAINT:  Left Lung Mass  SIGNIFICANT EVENTS  12/02 - Admit to hospital w/ seizure  STUDIES:  MRI BRAIN 12/2: 3.0 x 3.2 x 3.6 cm left posterior frontal superficial intra-axial mass with peripheral enhancement. Lesion appears cystic on CT but has finding suggesting mucin or proteinaceous content. No suggestion of abscess. 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  CT CHEST W/O 12/3:  Personally reviewed by me. Masslike consolidation within super segment of left lower lobe. Upper lobe predominant emphysematous changes consistent with tobacco use. No pathologic mediastinal or hilar lymphadenopathy appreciated. No pleural effusion. No pericardial effusion. CT ABD/PELVIS 12/3:  No evidence of neoplastic process within the abdomen or pelvis. A millimeter hypodense lesion within superior pole of right kidney due to small to characterize but likely small benign cyst.  HISTORY OF PRESENT ILLNESS:  Patient presented to Hospital on 12/02 reporting that he noted during driving to [place] on 12/21 he experienced right foot and right hand tremor. This stopped spontaneously. The patient presented with a seizure predominantly on his right side laying left side down on his recollection. The patient does endorse a chronic cough that is nonproductive. He denies any hemoptysis. He reports his cough is baseline. He denies any lymphadenopathy in his neck, groin, or axilla. He denies any hematuria or dysuria. He denies any hematochezia, melena, abdominal pain, nausea, or vomiting. He denies any headache. He denies any rashes or bruising. He denies any dyspnea. He denies any chest pain or pressure. Patient was found have a cerebral mass as well as a left lower lobe opacity on imaging.  PAST MEDICAL HISTORY :  No past medical  problems.  PAST SURGICAL HISTORY: Past Surgical History  Procedure Laterality Date  . Hernia repair    . Vasectomy      Prior to Admission medications   Medication Sig Start Date End Date Taking? Authorizing Provider  ibuprofen (ADVIL,MOTRIN) 200 MG tablet Take 400 mg by mouth every 6 (six) hours as needed.   Yes Historical Provider, MD   No Known Allergies  FAMILY HISTORY:  Family History  Problem Relation Age of Onset  . Breast cancer Mother   . Arthritis Father   . Colon polyps Mother     SOCIAL HISTORY: Social History   Social History  . Marital Status: Unknown    Spouse Name: N/A  . Number of Children: N/A  . Years of Education: N/A   Social History Main Topics  . Smoking status: Current Every Day Smoker -- 1.50 packs/day    Types: Cigarettes    Start date: 12/14/1979  . Smokeless tobacco: None  . Alcohol Use: 0.0 oz/week    0 Standard drinks or equivalent per week     Comment: daily 6 beers a day for the past fefw years.   . Drug Use: No  . Sexual Activity: Not Asked   Other Topics Concern  . 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 He does have a cat but no other home pets. No mold exposure. Recent travel to [place] but with symptoms at the onset of travel.    REVIEW OF SYSTEMS:  A pertinent 14 point review of systems is negative except as per the history of presenting illness.  SUBJECTIVE:   VITAL  SIGNS: Temp:  [97.9 F (36.6 C)-98.6 F (37 C)] 98.6 F (37 C) (12/04 1652) Pulse Rate:  [56-80] 69 (12/04 1652) Resp:  [16-20] 20 (12/04 1652) BP: (105-124)/(56-76) 118/66 mmHg (12/04 1652) SpO2:  [95 %-98 %] 96 % (12/04 1652)  PHYSICAL EXAMINATION: General:  Awake. Alert. No acute distress. Sitting preparing to eat breakfast.  Integument:  Warm & dry. No rash on exposed skin. No bruising. Lymphatics:  No appreciated cervical or supraclavicular lymphadenoapthy. HEENT:  Moist  mucus membranes. No oral ulcers. No scleral injection or icterus. PERRL. Cardiovascular:  Regular rate. No edema. No appreciable JVD.  Pulmonary:  Good aeration & clear to auscultation bilaterally. Symmetric chest wall expansion. No accessory muscle use. Abdomen: Soft. Normal bowel sounds. Nondistended. Grossly nontender. Musculoskeletal:  Normal bulk and tone. Hand grip strength 5/5 bilaterally. No joint deformity or effusion appreciated. Neurological:  CN 2-12 grossly in tact. No meningismus. Moving all 4 extremities equally. Symmetric brachioradialis deep tendon reflexes. Psychiatric:  Mood and affect congruent. Speech normal rhythm, rate & tone.    Recent Labs Lab 12/12/14 1459 12/13/14 0230  NA 138 137  K 4.1 4.7  CL 104 105  CO2 26 24  BUN 14 13  CREATININE 0.91 0.92  GLUCOSE 106* 128*    Recent Labs Lab 12/12/14 1459 12/13/14 0230  HGB 16.0 14.5  HCT 46.3 43.0  WBC 10.3 7.2  PLT 290 271   Ct Chest W Contrast  12/13/2014  CLINICAL DATA:  MRI showing a 3-4 cm intracranial mass favored to represent solitary metastasis, possibly endocrine or gastrointestinal origin. EXAM: CT CHEST, ABDOMEN, AND PELVIS WITH CONTRAST TECHNIQUE: Multidetector CT imaging of the chest, abdomen and pelvis was performed following the standard protocol during bolus administration of intravenous contrast. CONTRAST:  5m OMNIPAQUE IOHEXOL 300 MG/ML  SOLN COMPARISON:  None. FINDINGS: CT CHEST FINDINGS Mediastinum/Lymph Nodes: Scattered small lymph nodes within the mediastinum. No mass or enlarged lymph nodes within the mediastinum or perihilar regions. Thoracic aorta is normal in caliber and configuration. Heart size is normal. No pericardial effusion. Lungs/Pleura: Masslike consolidation within the superior segment of the left lower lobe, measuring 3.7 x 3.7 cm. Emphysematous changes noted within the upper lobes bilaterally, moderate in degree. Mild atelectasis noted at each lung base. Musculoskeletal: No  chest wall mass or suspicious bone lesions identified. CT ABDOMEN PELVIS FINDINGS Hepatobiliary: No masses or other significant abnormality. Pancreas: No mass, inflammatory changes, or other significant abnormality. Spleen: Within normal limits in size and appearance. Adrenals/Urinary Tract: 8 mm hypodense lesion within the superior pole of the right kidney, too small to characterize but most likely a small benign cyst. Kidneys otherwise unremarkable bilaterally without stone or hydronephrosis. Left adrenal gland is somewhat bulbous in configuration but without discrete mass. Right adrenal gland appears normal. Stomach/Bowel: Bowel is normal in caliber. No evidence of bowel wall thickening or bowel wall inflammation seen. Appendix is normal. Stomach is moderately distended with recently ingested materials but otherwise unremarkable. Vascular/Lymphatic: No pathologically enlarged lymph nodes. No evidence of abdominal aortic aneursym. Atherosclerotic changes are seen along the walls of the normal-caliber abdominal aorta. Reproductive: No mass or other significant abnormality. Other: No free fluid or soft tissue mass is identified within the abdomen or pelvis. No free intraperitoneal air. Musculoskeletal: No suspicious bone lesions identified. Mild degenerative changes seen within the lumbar spine but no acute osseous abnormality. IMPRESSION: 1. Masslike consolidation within the superior segment of the left lower lobe, measuring 3.7 x 3.7 cm, highly suspicious for neoplastic pulmonary  mass. 2. No other evidence of neoplastic process seen within the chest, abdomen or pelvis. No acute findings. 3. 8 mm hypodense lesion within the superior pole of the right kidney, too small to characterize but most likely a small benign cyst. Given the presumed intracranial metastasis and today's masslike consolidation in the left lower lobe, would consider follow-up renal protocol CT or MRI at some point to ensure benignity.  Electronically Signed   By: Franki Cabot M.D.   On: 12/13/2014 08:28   Mr [name] PI Contrast  12/12/2014  CLINICAL DATA:  Transient right-sided symptoms which have become worse, now with right-sided weakness. History of right-sided twitching. EXAM: MRI HEAD WITHOUT AND WITH CONTRAST TECHNIQUE: Multiplanar, multiecho pulse sequences of the brain and surrounding structures were obtained without and with intravenous contrast. CONTRAST:  71m MULTIHANCE GADOBENATE DIMEGLUMINE 529 MG/ML IV SOLN COMPARISON:  CT head earlier today. FINDINGS: A superficial, LEFT posterior frontal intra-axial mass is redemonstrated. Measurements are 30 x 32 x 36 mm (R-L x A-P x C-C). The lesion shows no diffusion restriction, and no significant susceptibility except perhaps a tiny area on its most superficial aspect. The lesion is T2 and FLAIR hyperintense, displays prominent T1 shortening, and is surrounded by a thin but somewhat irregular enhancing rim. There is mild vasogenic edema. There is no significant left-to-right shift.No other similar lesions are seen. Otherwise normal-appearing brain. Normal cerebral volume without significant white matter disease. Flow voids are maintained throughout the major intracranial vessels. No midline abnormality. No osseous findings. Extracranial soft tissues unremarkable. IMPRESSION: LEFT posterior frontal superficial intra-axial mass with peripheral enhancement, measuring 30 x 32 x 36 mm. The lesion appears cystic on CT, but displays prominent T1 shortening centrally on MR, suggesting mucin or proteinaceous content. No diffusion restriction to suggest abscess. A solitary metastasis is favored, possibly of endocrine or gastrointestinal origin. Given the lack of susceptibility on gradient sequence, melanoma is less likely but not excluded. Electronically Signed   By: JStaci RighterM.D.   On: 12/12/2014 18:28   Ct Abdomen Pelvis W Contrast  12/13/2014  CLINICAL DATA:  MRI showing a 3-4 cm  intracranial mass favored to represent solitary metastasis, possibly endocrine or gastrointestinal origin. EXAM: CT CHEST, ABDOMEN, AND PELVIS WITH CONTRAST TECHNIQUE: Multidetector CT imaging of the chest, abdomen and pelvis was performed following the standard protocol during bolus administration of intravenous contrast. CONTRAST:  82mOMNIPAQUE IOHEXOL 300 MG/ML  SOLN COMPARISON:  None. FINDINGS: CT CHEST FINDINGS Mediastinum/Lymph Nodes: Scattered small lymph nodes within the mediastinum. No mass or enlarged lymph nodes within the mediastinum or perihilar regions. Thoracic aorta is normal in caliber and configuration. Heart size is normal. No pericardial effusion. Lungs/Pleura: Masslike consolidation within the superior segment of the left lower lobe, measuring 3.7 x 3.7 cm. Emphysematous changes noted within the upper lobes bilaterally, moderate in degree. Mild atelectasis noted at each lung base. Musculoskeletal: No chest wall mass or suspicious bone lesions identified. CT ABDOMEN PELVIS FINDINGS Hepatobiliary: No masses or other significant abnormality. Pancreas: No mass, inflammatory changes, or other significant abnormality. Spleen: Within normal limits in size and appearance. Adrenals/Urinary Tract: 8 mm hypodense lesion within the superior pole of the right kidney, too small to characterize but most likely a small benign cyst. Kidneys otherwise unremarkable bilaterally without stone or hydronephrosis. Left adrenal gland is somewhat bulbous in configuration but without discrete mass. Right adrenal gland appears normal. Stomach/Bowel: Bowel is normal in caliber. No evidence of bowel wall thickening or bowel wall inflammation seen.  Appendix is normal. Stomach is moderately distended with recently ingested materials but otherwise unremarkable. Vascular/Lymphatic: No pathologically enlarged lymph nodes. No evidence of abdominal aortic aneursym. Atherosclerotic changes are seen along the walls of the  normal-caliber abdominal aorta. Reproductive: No mass or other significant abnormality. Other: No free fluid or soft tissue mass is identified within the abdomen or pelvis. No free intraperitoneal air. Musculoskeletal: No suspicious bone lesions identified. Mild degenerative changes seen within the lumbar spine but no acute osseous abnormality. IMPRESSION: 1. Masslike consolidation within the superior segment of the left lower lobe, measuring 3.7 x 3.7 cm, highly suspicious for neoplastic pulmonary mass. 2. No other evidence of neoplastic process seen within the chest, abdomen or pelvis. No acute findings. 3. 8 mm hypodense lesion within the superior pole of the right kidney, too small to characterize but most likely a small benign cyst. Given the presumed intracranial metastasis and today's masslike consolidation in the left lower lobe, would consider follow-up renal protocol CT or MRI at some point to ensure benignity. Electronically Signed   By: Franki Cabot M.D.   On: 12/13/2014 08:28    ASSESSMENT / PLAN: 54 year old Caucasian male with a long history of tobacco use. Patient presenting status post seizure. Patient does recall playing with his left side down. On my review of his imaging is opacity in his lung could be consistent with an aspiration pneumonia/pneumonitis. There is no lymphadenopathy within his mediastinum or hilum that would suggest further spread of a primary lung malignancy. Given the characterization by radiology on MRI certainly a metastatic process must still be considered despite negative abdominal CT imaging. The patient has never had a colonoscopy. Further evaluation of his lung opacity with bronchoscopy is reasonable. I did discuss the risks of the procedure including bleeding, infection, vocal cord injury, pneumothorax, medication allergy, and potentially death.  1. Left lower lobe opacity: Plan for bronchoscopy with lavage and possible biopsy tomorrow if this can be scheduled. I  have ordered the patient to be nothing by mouth after midnight. I have also ordered a consent for bronchoscopy. 2. Brain mass: Likely malignancy given characterization by radiology. Recommend PET/CT imaging. 3. Ongoing tobacco use: Recommend tobacco cessation education prior to discharge.  Sonia Baller Ashok Cordia, M.D. [facility] Pager:  [phone] After 3pm or if no response, call [phone] 12/14/2014, 5:20 PM

## 2014-12-14 NOTE — Progress Notes (Signed)
PROGRESS NOTE    Coen Miyasato MBE:[id number] DOB: 25-Jan-1960 DOA: 12/12/2014 PCP: Pcp Not In System  HPI/Brief narrative 54 year old male patient with no significant past medical history, ongoing tobacco abuse, presented to [facility] ED on 12/12/14 with approximately 10 days history of intermittent right-sided involuntary movements 2 and 5 days history of right lower extremity weakness. No headaches, visual symptoms, slurred speech or difficulty swallowing. CT and MRI brain confirmed brain mass suspicious for metastasis with vasogenic edema. CT chest abdomen and pelvis confirmed left lower lobe lung mass. Neurosurgery consulted. Pulmonology consulted for bronchoscopy biopsy.   Assessment/Plan:  Left frontal cystic mass and a right hemiparesis/suspect metastatic lesion - May be from primary lung cancer (left lower lobe mass on CT chest) versus other etiologies - Neurosurgery input appreciated and recommend tissue diagnosis to guide treatment decision of radiation + surgery versus radiation alone - Continue IV Decadron - PT and OT evaluation - May need further evaluation of right superior pole kidney lesion seen on CT abdomen - Pulmonology was consulted on 12/3. They have evaluated patient this morning and for possible bronchoscopy 12/5.  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  Tobacco abuse - Cessation counseled. Continue nicotine patch.  Anxiety state - Continue when necessary clonazepam.   DVT prophylaxis: Subcutaneous heparin Code Status: Full Family Communication: Discussed with patient's ex-wife at bedside. Disposition Plan: DC home when medically stable.   Consultants:  Neurosurgery  Pulmonology  Procedures:  None  Antibiotics:  None   Subjective: Its patient's birthday-wished him. Patient upset this morning regarding his diagnosis. Met with pulmonologist this  morning.  Objective: Filed Vitals:   12/14/14 0130 12/14/14 0520 12/14/14 1021 12/14/14 1401  BP: 111/64 114/56 114/72 105/72  Pulse: 60 65 56 60  Temp: 97.9 F (36.6 C) 98.4 F (36.9 C) 98 F (36.7 C) 98 F (36.7 C)  TempSrc: Oral Oral Oral Oral  Resp: _0 Height:      Weight:      SpO2: 98% 98% 97% 95%   No intake or output data in the 24 hours ending 12/14/14 1644 Filed Weights   12/12/14 1311 12/12/14 2216  Weight: 71.923 kg (158 lb 9 oz) 71.215 kg (157 lb)     Exam:  General exam: Moderately built and thinly nourished pleasant young male lying comfortably in bed. Respiratory system: Clear. No increased work of breathing. Cardiovascular system: S1 & S2 heard, RRR. No JVD, murmurs, gallops, clicks or pedal edema. Gastrointestinal system: Abdomen is nondistended, soft and nontender. Normal bowel sounds heard. Central nervous system: Alert and oriented. No focal neurological deficits. Extremities: 5 x 5 power in left limbs and 4 x 5 power in right limbs, RLE weaker than RUE   Data Reviewed: Basic Metabolic Panel:  Recent Labs Lab 12/12/14 1459 12/13/14 0230  NA 138 137  K 4.1 4.7  CL 104 105  CO2 26 24  GLUCOSE 106* 128*  BUN 14 13  CREATININE 0.91 0.92  CALCIUM 9.1 8.9  MG  --  2.3  PHOS  --  2.9   Liver Function Tests:  Recent Labs Lab 12/12/14 1459 12/13/14 0230  AST 34 27  ALT 22 19  ALKPHOS 80 71  BILITOT 0.7 0.8  PROT 6.8 6.3*  ALBUMIN 3.7 3.1*   No results for input(s): LIPASE, AMYLASE in the last 168 hours. No results for input(s): AMMONIA in the last 168  hours. CBC:  Recent Labs Lab 12/12/14 1459 12/13/14 0230  WBC 10.3 7.2  NEUTROABS 6.1  --   HGB 16.0 14.5  HCT 46.3 43.0  MCV 98.5 99.1  PLT 290 271   Cardiac Enzymes: No results for input(s): CKTOTAL, CKMB, CKMBINDEX, TROPONINI in the last 168 hours. BNP (last 3 results) No results for input(s): PROBNP in the last 8760 hours. CBG: No results for input(s): GLUCAP  in the last 168 hours.  No results found for this or any previous visit (from the past 240 hour(s)).         Studies: Ct Chest W Contrast  12/13/2014  CLINICAL DATA:  MRI showing a 3-4 cm intracranial mass favored to represent solitary metastasis, possibly endocrine or gastrointestinal origin. EXAM: CT CHEST, ABDOMEN, AND PELVIS WITH CONTRAST TECHNIQUE: Multidetector CT imaging of the chest, abdomen and pelvis was performed following the standard protocol during bolus administration of intravenous contrast. CONTRAST:  79m OMNIPAQUE IOHEXOL 300 MG/ML  SOLN COMPARISON:  None. FINDINGS: CT CHEST FINDINGS Mediastinum/Lymph Nodes: Scattered small lymph nodes within the mediastinum. No mass or enlarged lymph nodes within the mediastinum or perihilar regions. Thoracic aorta is normal in caliber and configuration. Heart size is normal. No pericardial effusion. Lungs/Pleura: Masslike consolidation within the superior segment of the left lower lobe, measuring 3.7 x 3.7 cm. Emphysematous changes noted within the upper lobes bilaterally, moderate in degree. Mild atelectasis noted at each lung base. Musculoskeletal: No chest wall mass or suspicious bone lesions identified. CT ABDOMEN PELVIS FINDINGS Hepatobiliary: No masses or other significant abnormality. Pancreas: No mass, inflammatory changes, or other significant abnormality. Spleen: Within normal limits in size and appearance. Adrenals/Urinary Tract: 8 mm hypodense lesion within the superior pole of the right kidney, too small to characterize but most likely a small benign cyst. Kidneys otherwise unremarkable bilaterally without stone or hydronephrosis. Left adrenal gland is somewhat bulbous in configuration but without discrete mass. Right adrenal gland appears normal. Stomach/Bowel: Bowel is normal in caliber. No evidence of bowel wall thickening or bowel wall inflammation seen. Appendix is normal. Stomach is moderately distended with recently ingested  materials but otherwise unremarkable. Vascular/Lymphatic: No pathologically enlarged lymph nodes. No evidence of abdominal aortic aneursym. Atherosclerotic changes are seen along the walls of the normal-caliber abdominal aorta. Reproductive: No mass or other significant abnormality. Other: No free fluid or soft tissue mass is identified within the abdomen or pelvis. No free intraperitoneal air. Musculoskeletal: No suspicious bone lesions identified. Mild degenerative changes seen within the lumbar spine but no acute osseous abnormality. IMPRESSION: 1. Masslike consolidation within the superior segment of the left lower lobe, measuring 3.7 x 3.7 cm, highly suspicious for neoplastic pulmonary mass. 2. No other evidence of neoplastic process seen within the chest, abdomen or pelvis. No acute findings. 3. 8 mm hypodense lesion within the superior pole of the right kidney, too small to characterize but most likely a small benign cyst. Given the presumed intracranial metastasis and today's masslike consolidation in the left lower lobe, would consider follow-up renal protocol CT or MRI at some point to ensure benignity. Electronically Signed   By: SFranki CabotM.D.   On: 12/13/2014 08:28   Mr BJeri CosWMWContrast  12/12/2014  CLINICAL DATA:  Transient right-sided symptoms which have become worse, now with right-sided weakness. History of right-sided twitching. EXAM: MRI HEAD WITHOUT AND WITH CONTRAST TECHNIQUE: Multiplanar, multiecho pulse sequences of the brain and surrounding structures were obtained without and with intravenous contrast. CONTRAST:  125mMULTIHANCE  GADOBENATE DIMEGLUMINE 529 MG/ML IV SOLN COMPARISON:  CT head earlier today. FINDINGS: A superficial, LEFT posterior frontal intra-axial mass is redemonstrated. Measurements are 30 x 32 x 36 mm (R-L x A-P x C-C). The lesion shows no diffusion restriction, and no significant susceptibility except perhaps a tiny area on its most superficial aspect. The lesion  is T2 and FLAIR hyperintense, displays prominent T1 shortening, and is surrounded by a thin but somewhat irregular enhancing rim. There is mild vasogenic edema. There is no significant left-to-right shift.No other similar lesions are seen. Otherwise normal-appearing brain. Normal cerebral volume without significant white matter disease. Flow voids are maintained throughout the major intracranial vessels. No midline abnormality. No osseous findings. Extracranial soft tissues unremarkable. IMPRESSION: LEFT posterior frontal superficial intra-axial mass with peripheral enhancement, measuring 30 x 32 x 36 mm. The lesion appears cystic on CT, but displays prominent T1 shortening centrally on MR, suggesting mucin or proteinaceous content. No diffusion restriction to suggest abscess. A solitary metastasis is favored, possibly of endocrine or gastrointestinal origin. Given the lack of susceptibility on gradient sequence, melanoma is less likely but not excluded. Electronically Signed   By: Staci Righter M.D.   On: 12/12/2014 18:28   Ct Abdomen Pelvis W Contrast  12/13/2014  CLINICAL DATA:  MRI showing a 3-4 cm intracranial mass favored to represent solitary metastasis, possibly endocrine or gastrointestinal origin. EXAM: CT CHEST, ABDOMEN, AND PELVIS WITH CONTRAST TECHNIQUE: Multidetector CT imaging of the chest, abdomen and pelvis was performed following the standard protocol during bolus administration of intravenous contrast. CONTRAST:  82m OMNIPAQUE IOHEXOL 300 MG/ML  SOLN COMPARISON:  None. FINDINGS: CT CHEST FINDINGS Mediastinum/Lymph Nodes: Scattered small lymph nodes within the mediastinum. No mass or enlarged lymph nodes within the mediastinum or perihilar regions. Thoracic aorta is normal in caliber and configuration. Heart size is normal. No pericardial effusion. Lungs/Pleura: Masslike consolidation within the superior segment of the left lower lobe, measuring 3.7 x 3.7 cm. Emphysematous changes noted within  the upper lobes bilaterally, moderate in degree. Mild atelectasis noted at each lung base. Musculoskeletal: No chest wall mass or suspicious bone lesions identified. CT ABDOMEN PELVIS FINDINGS Hepatobiliary: No masses or other significant abnormality. Pancreas: No mass, inflammatory changes, or other significant abnormality. Spleen: Within normal limits in size and appearance. Adrenals/Urinary Tract: 8 mm hypodense lesion within the superior pole of the right kidney, too small to characterize but most likely a small benign cyst. Kidneys otherwise unremarkable bilaterally without stone or hydronephrosis. Left adrenal gland is somewhat bulbous in configuration but without discrete mass. Right adrenal gland appears normal. Stomach/Bowel: Bowel is normal in caliber. No evidence of bowel wall thickening or bowel wall inflammation seen. Appendix is normal. Stomach is moderately distended with recently ingested materials but otherwise unremarkable. Vascular/Lymphatic: No pathologically enlarged lymph nodes. No evidence of abdominal aortic aneursym. Atherosclerotic changes are seen along the walls of the normal-caliber abdominal aorta. Reproductive: No mass or other significant abnormality. Other: No free fluid or soft tissue mass is identified within the abdomen or pelvis. No free intraperitoneal air. Musculoskeletal: No suspicious bone lesions identified. Mild degenerative changes seen within the lumbar spine but no acute osseous abnormality. IMPRESSION: 1. Masslike consolidation within the superior segment of the left lower lobe, measuring 3.7 x 3.7 cm, highly suspicious for neoplastic pulmonary mass. 2. No other evidence of neoplastic process seen within the chest, abdomen or pelvis. No acute findings. 3. 8 mm hypodense lesion within the superior pole of the right kidney, too small to characterize  but most likely a small benign cyst. Given the presumed intracranial metastasis and today's masslike consolidation in the  left lower lobe, would consider follow-up renal protocol CT or MRI at some point to ensure benignity. Electronically Signed   By: Franki Cabot M.D.   On: 12/13/2014 08:28        Scheduled Meds: . dexamethasone  4 mg Intravenous 4 times per day  . feeding supplement (ENSURE ENLIVE)  237 mL Oral BID BM  . Influenza vac split quadrivalent PF  0.5 mL Intramuscular Tomorrow-1000  . levETIRAcetam  750 mg Oral BID  . nicotine  21 mg Transdermal Daily  . pneumococcal 23 valent vaccine  0.5 mL Intramuscular Tomorrow-1000  . sodium chloride  3 mL Intravenous Q12H   Continuous Infusions:   Active Problems:   Metastasis to brain [facility])   Right sided weakness   Brain metastases ([place])   Acute right-sided weakness    Time spent: 20 minutes.    Vernell Leep, MD, FACP, FHM. [facility] Pager [phone]  If 7PM-7AM, please contact night-coverage [email or web address] Password [email or web address] 12/14/2014, 4:44 PM    LOS: 2 days

## 2014-12-14 NOTE — Progress Notes (Signed)
No acute events Affect unchanged Stable right hemiparesis Neuro stable Await results of biopsy

## 2014-12-15 DIAGNOSIS — R911 Solitary pulmonary nodule: Secondary | ICD-10-CM

## 2014-12-15 NOTE — Progress Notes (Signed)
   12/15/14 1705  Clinical Encounter Type  Visited With Patient;Health care provider  Visit Type Initial;Pre-op  Referral From Patient  Spiritual Encounters  Spiritual Needs Literature   Chaplain was consulted to complete advanced directive paper work. Chaplain gave patient the information and helped him fill out the form. However, no witnesses were available to make the form official. Chaplains are available to help complete the document tomorrow morning after his procedure. Chaplain support available as needed.   Jeri Lager, Chaplain 12/15/2014 5:21 PM

## 2014-12-15 NOTE — Evaluation (Signed)
Occupational Therapy Evaluation Patient Details Name: Timothy Obrien MRN: [id number] DOB: 07-Oct-1960 Today's Date: 12/15/2014    History of Present Illness Adm with Rt sided weakness; CT head +Lt frontal cystic mass (consistent with metastasis); CT chest + lung mass. Awaiting bronchoscopy and further treatment options PMHx-none significant   Clinical Impression   Pt is functioning at a modified independent level in ADL and mobility.  He does demonstrate impulsivity, minimization of the gravity of his situation and desire to go home and continue to smoke and drink.  This may be pt's baseline. Pt warned to not drive until MD authorizes, but pt stating he would do whatever he wants.  No further OT needs.   Follow Up Recommendations  No OT follow up    Equipment Recommendations  None recommended by OT    Recommendations for Other Services       Precautions / Restrictions Precautions Precautions: Fall Restrictions Weight Bearing Restrictions: No      Mobility Bed Mobility Overal bed mobility: Independent                Transfers Overall transfer level: Modified independent Equipment used: None                  Balance                                            ADL Overall ADL's : Modified independent                                       General ADL Comments: Practiced tub transfer, toilet transfer at a modified independent level.     Vision     Perception     Praxis      Pertinent Vitals/Pain Pain Assessment: No/denies pain     Hand Dominance Right   Extremity/Trunk Assessment Upper Extremity Assessment Upper Extremity Assessment: Overall WFL for tasks assessed   Lower Extremity Assessment Lower Extremity Assessment: Defer to PT evaluation   Cervical / Trunk Assessment Cervical / Trunk Assessment: Normal   Communication Communication Communication: No difficulties   Cognition  Arousal/Alertness: Awake/alert Behavior During Therapy: Impulsive Overall Cognitive Status: Impaired/Different from baseline Area of Impairment: Safety/judgement         Safety/Judgement: Decreased awareness of safety;Decreased awareness of deficits         General Comments       Exercises       Shoulder Instructions      Home Living Family/patient expects to be discharged to:: Private residence Living Arrangements: Alone   Type of Home: House Home Access: Stairs to enter Technical brewer of Steps: 2 Entrance Stairs-Rails: None Home Layout: Multi-level Alternate Level Stairs-Number of Steps: 14 Alternate Level Stairs-Rails: Right Bathroom Shower/Tub: Walk-in shower;Tub/shower unit   Bathroom Toilet: Standard     Home Equipment: None   Additional Comments: plans to use the shower       Prior Functioning/Environment Level of Independence: Independent        Comments: fall occurred as pt was getting out of tub    OT Diagnosis: Cognitive deficits;Generalized weakness   OT Problem List:     OT Treatment/Interventions:      OT Goals(Current goals can be found in the care plan section) Acute Rehab OT Goals  Patient Stated Goal: go home today  OT Frequency:     Barriers to D/C:            Co-evaluation              End of Session Equipment Utilized During Treatment: Gait belt  Activity Tolerance: Patient tolerated treatment well Patient left: in bed;with call bell/phone within reach   Time: 7530-1040 OT Time Calculation (min): 18 min Charges:  OT General Charges $OT Visit: 1 Procedure OT Evaluation $Initial OT Evaluation Tier I: 1 Procedure G-Codes:    Malka So 12/15/2014, 11:47 AM

## 2014-12-15 NOTE — Progress Notes (Signed)
No acute events AVSS Subtle improvement in right hemiparesis Stable Continue Keppra and dexamethasone Await biopsy results

## 2014-12-15 NOTE — Progress Notes (Signed)
PROGRESS NOTE    Timothy Obrien NUU:[id number] DOB: Jan 13, 1960 DOA: 12/12/2014 PCP: Pcp Not In System  HPI/Brief narrative 54 year old male patient with no significant past medical history, ongoing tobacco abuse, presented to [facility] ED on 12/12/14 with approximately 10 days history of intermittent right-sided involuntary movements 2 and 5 days history of right lower extremity weakness. No headaches, visual symptoms, slurred speech or difficulty swallowing. CT and MRI brain confirmed brain mass suspicious for metastasis with vasogenic edema. CT chest abdomen and pelvis confirmed left lower lobe lung mass. Neurosurgery consulted. Pulmonology consulted for bronchoscopy biopsy-scheduled for 12/6. Radiation oncology aware and will see after diagnosis established by biopsy..   Assessment/Plan:  Left frontal cystic mass and a right hemiparesis/suspect metastatic lesion - May be from primary lung cancer (left lower lobe mass on CT chest) versus other etiologies - Neurosurgery input appreciated and recommend tissue diagnosis to guide treatment decision of radiation + surgery versus radiation alone - Continue IV Decadron - PT and OT evaluation: Recommend outpatient PT and no OT follow-up. - May need further evaluation of right superior pole kidney lesion seen on CT abdomen - Pulmonology consultation appreciated. Discussed with Dr. Lamonte Sakai. Scheduled for bronchoscopy and biopsy on 12/6 at 7 AM. Patient could possibly be discharged post bronchoscopy tomorrow. - Dr. Lamonte Sakai has consulted radiation oncology who will see patient after definitive biopsy diagnosis.  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  Tobacco abuse - Cessation counseled. Continue nicotine patch.  Anxiety state - Continue when necessary clonazepam. stable.    DVT prophylaxis: Subcutaneous heparin Code Status: Full Family Communication: none at  bedside today.  Disposition Plan: DC home possibly 12/6 after bronchoscopy and biopsy.    Consultants:  Neurosurgery  Pulmonology  Procedures:  None  Antibiotics:  None   Subjective: Patient kind of unhappy that bronchoscopy was postponed. However it seems to be in good spirits and no new complaints. Feels that right leg strength is improving.   Objective: Filed Vitals:   12/14/14 2158 12/15/14 0104 12/15/14 0508 12/15/14 1057  BP: 103/55 102/52 102/60 112/71  Pulse: 55 60 49 57  Temp: 98.5 F (36.9 C) 97.8 F (36.6 C) 97.5 F (36.4 C) 98.1 F (36.7 C)  TempSrc: Oral Oral Oral Oral  Resp: '20 20 20 18  '$ Height:      Weight:      SpO2: 99% 98% 100% 98%    Intake/Output Summary (Last 24 hours) at 12/15/14 1312 Last data filed at 12/15/14 1055  Gross per 24 hour  Intake    243 ml  Output      0 ml  Net    243 ml   Filed Weights   12/12/14 1311 12/12/14 2216  Weight: 71.923 kg (158 lb 9 oz) 71.215 kg (157 lb)     Exam:  General exam: Moderately built and thinly nourished pleasant young male seen ambulating in the halls with OT. Respiratory system: Clear. No increased work of breathing. Cardiovascular system: S1 & S2 heard, RRR. No JVD, murmurs, gallops, clicks or pedal edema. Gastrointestinal system: Abdomen is nondistended, soft and nontender. Normal bowel sounds heard. Central nervous system: Alert and oriented. No focal neurological deficits. Extremities: 5 x 5 power in left limbs and 4 x 5 power in right limbs, RLE weaker than RUE   Data Reviewed: Basic Metabolic Panel:  Recent Labs Lab 12/12/14 1459 12/13/14 0230  NA 138 137  K  4.1 4.7  CL 104 105  CO2 26 24  GLUCOSE 106* 128*  BUN 14 13  CREATININE 0.91 0.92  CALCIUM 9.1 8.9  MG  --  2.3  PHOS  --  2.9   Liver Function Tests:  Recent Labs Lab 12/12/14 1459 12/13/14 0230  AST 34 27  ALT 22 19  ALKPHOS 80 71  BILITOT 0.7 0.8  PROT 6.8 6.3*  ALBUMIN 3.7 3.1*   No results for  input(s): LIPASE, AMYLASE in the last 168 hours. No results for input(s): AMMONIA in the last 168 hours. CBC:  Recent Labs Lab 12/12/14 1459 12/13/14 0230  WBC 10.3 7.2  NEUTROABS 6.1  --   HGB 16.0 14.5  HCT 46.3 43.0  MCV 98.5 99.1  PLT 290 271   Cardiac Enzymes: No results for input(s): CKTOTAL, CKMB, CKMBINDEX, TROPONINI in the last 168 hours. BNP (last 3 results) No results for input(s): PROBNP in the last 8760 hours. CBG: No results for input(s): GLUCAP in the last 168 hours.  No results found for this or any previous visit (from the past 240 hour(s)).         Studies: No results found.      Scheduled Meds: . dexamethasone  4 mg Intravenous 4 times per day  . feeding supplement (ENSURE ENLIVE)  237 mL Oral BID BM  . Influenza vac split quadrivalent PF  0.5 mL Intramuscular Tomorrow-1000  . levETIRAcetam  750 mg Oral BID  . nicotine  21 mg Transdermal Daily  . pneumococcal 23 valent vaccine  0.5 mL Intramuscular Tomorrow-1000  . sodium chloride  3 mL Intravenous Q12H   Continuous Infusions:   Active Problems:   Metastasis to brain [facility])   Right sided weakness   Brain metastases ([place])   Acute right-sided weakness    Time spent: 20 minutes.    Vernell Leep, MD, FACP, FHM. [facility] Pager [phone]  If 7PM-7AM, please contact night-coverage [email or web address] Password [email or web address] 12/15/2014, 1:12 PM    LOS: 3 days

## 2014-12-15 NOTE — Progress Notes (Signed)
Name: Timothy Obrien MRN: [id number] DOB: 1960-09-24    ADMISSION DATE:  12/12/2014 CONSULTATION DATE:  12/14/2014  REFERRING MD :  Hospitalist Service  CHIEF COMPLAINT:  Left Lung Mass  SIGNIFICANT EVENTS  12/02 - Admit to hospital w/ seizure  STUDIES:  MRI BRAIN 12/2: 3.0 x 3.2 x 3.6 cm left posterior frontal superficial intra-axial mass with peripheral enhancement. Lesion appears cystic on CT but has finding suggesting mucin or proteinaceous content. No suggestion of abscess. 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  CT CHEST W/O 12/3:  Personally reviewed by me. Masslike consolidation within super segment of left lower lobe. Upper lobe predominant emphysematous changes consistent with tobacco use. No pathologic mediastinal or hilar lymphadenopathy appreciated. No pleural effusion. No pericardial effusion. CT ABD/PELVIS 12/3:  No evidence of neoplastic process within the abdomen or pelvis. A millimeter hypodense lesion within superior pole of right kidney due to small to characterize but likely small benign cyst.  HISTORY OF PRESENT ILLNESS:  Patient presented to Hospital on 12/02 reporting that he noted during driving to [place] on 12/21 he experienced right foot and right hand tremor. This stopped spontaneously. The patient presented with a seizure predominantly on his right side laying left side down on his recollection. The patient does endorse a chronic cough that is nonproductive. He denies any hemoptysis. He reports his cough is baseline. He denies any lymphadenopathy in his neck, groin, or axilla. He denies any hematuria or dysuria. He denies any hematochezia, melena, abdominal pain, nausea, or vomiting. He denies any headache. He denies any rashes or bruising. He denies any dyspnea. He denies any chest pain or pressure. Patient was found have a cerebral mass as well as a left lower lobe opacity on imaging.  SUBJECTIVE:  Feeling OK, no seizure  activity  VITAL SIGNS: Temp:  [97.5 F (36.4 C)-98.6 F (37 C)] 97.5 F (36.4 C) (12/05 0508) Pulse Rate:  [49-69] 49 (12/05 0508) Resp:  [20] 20 (12/05 0508) BP: (102-118)/(52-72) 102/60 mmHg (12/05 0508) SpO2:  [95 %-100 %] 100 % (12/05 0508)  PHYSICAL EXAMINATION: General:  Awake. Alert. No acute distress. Sitting preparing to eat breakfast.  Integument:  Warm & dry. No rash on exposed skin. No bruising. Lymphatics:  No appreciated cervical or supraclavicular lymphadenoapthy. HEENT:  Moist mucus membranes. No oral ulcers. No scleral injection or icterus. PERRL. Cardiovascular:  Regular rate. No edema. No appreciable JVD.  Pulmonary:  Good aeration & clear to auscultation bilaterally. Symmetric chest wall expansion. No accessory muscle use. Abdomen: Soft. Normal bowel sounds. Nondistended. Grossly nontender. Musculoskeletal:  Normal bulk and tone. Hand grip strength 5/5 bilaterally. No joint deformity or effusion appreciated. Neurological:  CN 2-12 grossly in tact. No meningismus. Moving all 4 extremities equally. Symmetric brachioradialis deep tendon reflexes. Psychiatric:  Mood and affect congruent. Speech normal rhythm, rate & tone.    Recent Labs Lab 12/12/14 1459 12/13/14 0230  NA 138 137  K 4.1 4.7  CL 104 105  CO2 26 24  BUN 14 13  CREATININE 0.91 0.92  GLUCOSE 106* 128*    Recent Labs Lab 12/12/14 1459 12/13/14 0230  HGB 16.0 14.5  HCT 46.3 43.0  WBC 10.3 7.2  PLT 290 271   No results found.  ASSESSMENT / PLAN: 54 year old Caucasian male with a long history of tobacco use. Patient presenting status post seizure, found to have L frontal lobe mass. Eval thus far with a L superior segmental opacity concerning for malignancy. No other evidence  for LAD or malignancy in the chest or abd.   1. Left lower lobe opacity. I agree that he needs FOB. Discussed with him today. Earliest I can get him scheduled is 7:00am on 12/6. I will give him a diet now, make him NPO  after MN tonight 2. Brain mass: Likely malignancy given characterization by radiology. I will consult with Radiation Onc regarding plans for XRT. They may want to see him before discharge.  3. Ongoing tobacco use: Recommend tobacco cessation education prior to discharge.  Baltazar Apo, MD, PhD 12/15/2014, 10:07 AM [facility] [phone] or if no answer [phone]

## 2014-12-15 NOTE — Progress Notes (Signed)
Physical Therapy Treatment Patient Details Name: Timothy Obrien MRN: [id number] DOB: 1960/07/17 Today's Date: 12/15/2014    History of Present Illness Adm with Rt sided weakness; CT head +Lt frontal cystic mass (consistent with metastasis); CT chest + lung mass. Awaiting bronchoscopy and further treatment options PMHx-none significant    PT Comments    Patient progressing well and able to complete steps. Ambulating in hallway on his own. Patient is slightly impulsive with decreased safety concerns. Speaks about going home and smoking and drinking. RN aware. Per eval dtr reports some of concerns as baseline. Will have assistance from Ex wife at discharge. Recommend OPPT follow up for high level balance.   Follow Up Recommendations  Outpatient PT (might not be highest priority. High level balance)     Equipment Recommendations  None recommended by PT    Recommendations for Other Services       Precautions / Restrictions Precautions Precautions: Fall    Mobility  Bed Mobility Overal bed mobility: Independent                Transfers Overall transfer level: Modified independent                  Ambulation/Gait Ambulation/Gait assistance: Modified independent (Device/Increase time) Ambulation Distance (Feet): 400 Feet Assistive device: None       General Gait Details: Able to walk in high marching and various speeds. Understands limited of R LE   Stairs Stairs: Yes Stairs assistance: Supervision Stair Management: Two rails Number of Stairs: 10 General stair comments: SUpervision for safety. No LOB noted  Wheelchair Mobility    Modified Rankin (Stroke Patients Only)       Balance                                    Cognition Arousal/Alertness: Awake/alert Behavior During Therapy: Impulsive Overall Cognitive Status: Within Functional Limits for tasks assessed (Dtr reports at baseline on eval)                       Exercises      General Comments        Pertinent Vitals/Pain Pain Assessment: No/denies pain    Home Living                      Prior Function            PT Goals (current goals can now be found in the care plan section) Progress towards PT goals: Progressing toward goals    Frequency  Min 3X/week    PT Plan Current plan remains appropriate    Co-evaluation             End of Session   Activity Tolerance: Patient tolerated treatment well Patient left: in bed     Time: 6063-0160 PT Time Calculation (min) (ACUTE ONLY): 23 min  Charges:  $Gait Training: 23-37 mins                    G Codes:      Jacqualyn Posey 12/15/2014, 11:18 AM 12/15/2014 [place], Tonia Brooms PTA

## 2014-12-15 NOTE — Progress Notes (Signed)
  Radiation Oncology         [phone]) [phone] ________________________________  Name: Timothy Obrien MRN: [id number]  Date: 12/12/2014  DOB: 1960-03-01  Chart Note:  This patient was presented for discussion in our brain and spine oncology conference this morning.  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  He is scheduled for bronchoscopy with biopsy by Dr. Lamonte Sakai.  If biopsy shows small cell lung cancer, he may require whole brain irradiation.  If biopsy more likely shows non-small cell lung cancer, he may be a candidate for pre-op SRS and resection of the brain metastasis.  We will follow his progress and see him in consultation once the diagnosis has been established.   ________________________________  Sheral Apley. Tammi Klippel, M.D.

## 2014-12-15 NOTE — Care Management Note (Signed)
Case Management Note  Patient Details  Name: Keghan Mcfarren MRN: [id number] Date of Birth: June 24, 1960  Subjective/Objective:                    Action/Plan: CM received consult for Advanced Directives and insurance issues. Patient was given an Advanced Directives packet and chaplain services was notified that patient would like to be seen tonight, as he has a procedure scheduled for 0700 tomorrow.  Patient denies insurance issues. He is currently on COBRA through [place], which will be switching to [facility] in January.  He has some concerns regarding the switch, but has completed the paperwork with his employer and has been given confirmation that it is being taken care of.  CM will continue to follow for any additional discharge needs.  Expected Discharge Date:                  Expected Discharge Plan:     In-House Referral:     Discharge planning Services     Post Acute Care Choice:    Choice offered to:     DME Arranged:    DME Agency:     HH Arranged:    [place] Agency:     Status of Service:     Medicare Important Message Given:    Date Medicare IM Given:    Medicare IM give by:    Date Additional Medicare IM Given:    Additional Medicare Important Message give by:     If discussed at [place] of Stay Meetings, dates discussed:    Additional Comments:  Rolm Baptise, RN 12/15/2014, 4:11 PM

## 2014-12-16 ENCOUNTER — Encounter (HOSPITAL_COMMUNITY)
Admission: EM | Disposition: A | Discharge status: Home / Self Care | Payer: Self-pay | Source: Home / Self Care | Attending: Internal Medicine

## 2014-12-16 ENCOUNTER — Inpatient Hospital Stay (HOSPITAL_COMMUNITY): Payer: Managed Care, Other (non HMO)

## 2014-12-16 DIAGNOSIS — R918 Other nonspecific abnormal finding of lung field: Secondary | ICD-10-CM

## 2014-12-16 DIAGNOSIS — F411 Generalized anxiety disorder: Secondary | ICD-10-CM

## 2014-12-16 HISTORY — PX: VIDEO BRONCHOSCOPY: SHX5072

## 2014-12-16 SURGERY — BRONCHOSCOPY, WITH FLUOROSCOPY
Anesthesia: Moderate Sedation | Laterality: Bilateral

## 2014-12-16 MED ORDER — UNABLE TO FIND: Status: DC

## 2014-12-16 MED ORDER — LEVETIRACETAM 750 MG PO TABS: 750.0000 mg | ORAL_TABLET | Freq: Two times a day (BID) | ORAL | Status: DC

## 2014-12-16 MED ORDER — LIDOCAINE HCL (PF) 1 % IJ SOLN
INTRAMUSCULAR | Status: DC | PRN
  Administered 2014-12-16: 6 mL

## 2014-12-16 MED ORDER — MIDAZOLAM HCL 5 MG/ML IJ SOLN
INTRAMUSCULAR | Status: AC
  Filled 2014-12-16: qty 2

## 2014-12-16 MED ORDER — DEXAMETHASONE 4 MG PO TABS: 4.0000 mg | ORAL_TABLET | Freq: Four times a day (QID) | ORAL | Status: DC

## 2014-12-16 MED ORDER — NICOTINE 21 MG/24HR TD PT24: 21.0000 mg | MEDICATED_PATCH | Freq: Every day | TRANSDERMAL | Status: DC

## 2014-12-16 MED ORDER — LIDOCAINE HCL 2 % EX GEL
CUTANEOUS | Status: DC | PRN
  Administered 2014-12-16: 1

## 2014-12-16 MED ORDER — FENTANYL CITRATE (PF) 100 MCG/2ML IJ SOLN
INTRAMUSCULAR | Status: AC
  Filled 2014-12-16: qty 4

## 2014-12-16 MED ORDER — SODIUM CHLORIDE 0.9 % IV SOLN
INTRAVENOUS | Status: DC
  Administered 2014-12-16: 07:00:00 via INTRAVENOUS

## 2014-12-16 MED ORDER — ACETAMINOPHEN 325 MG PO TABS: 650.0000 mg | ORAL_TABLET | Freq: Four times a day (QID) | ORAL | Status: DC | PRN

## 2014-12-16 MED ORDER — CLONAZEPAM 0.5 MG PO TABS: 0.5000 mg | ORAL_TABLET | Freq: Three times a day (TID) | ORAL | Status: DC | PRN

## 2014-12-16 MED ORDER — MIDAZOLAM HCL 10 MG/2ML IJ SOLN
INTRAMUSCULAR | Status: DC | PRN
  Administered 2014-12-16 (×2): 2 mg via INTRAVENOUS
  Administered 2014-12-16: 1 mg via INTRAVENOUS
  Administered 2014-12-16: 3 mg via INTRAVENOUS

## 2014-12-16 MED ORDER — FENTANYL CITRATE (PF) 100 MCG/2ML IJ SOLN
INTRAMUSCULAR | Status: DC | PRN
  Administered 2014-12-16: 50 ug via INTRAVENOUS
  Administered 2014-12-16 (×2): 25 ug via INTRAVENOUS
  Administered 2014-12-16: 50 ug via INTRAVENOUS
  Administered 2014-12-16 (×2): 25 ug via INTRAVENOUS

## 2014-12-16 MED ORDER — PHENYLEPHRINE HCL 0.25 % NA SOLN
NASAL | Status: DC | PRN
  Administered 2014-12-16: 2 via NASAL

## 2014-12-16 MED ORDER — PANTOPRAZOLE SODIUM 20 MG PO TBEC: 20.0000 mg | DELAYED_RELEASE_TABLET | Freq: Every day | ORAL | Status: DC

## 2014-12-16 MED ORDER — ENSURE ENLIVE PO LIQD: 237.0000 mL | Freq: Two times a day (BID) | ORAL | Status: DC

## 2014-12-16 NOTE — Progress Notes (Signed)
No acute events AVSS Awake and alert Subtle improvement in right hemiparesis Stable OK to d/c from my perspective Will plan next stage of treatment when pathology results come back D/c with decadron '4mg'$  po q6, keppra '750mg'$  bid, and protonix '20mg'$  daily

## 2014-12-16 NOTE — Care Management Note (Signed)
Case Management Note  Patient Details  Name: [name] MRN: [id number] Date of Birth: March 02, 1960  Subjective/Objective:                    Action/Plan: Met with patient and ex-wife to discuss discharge planning. Patient is agreeable to outpatient PT and has chosen [facility]. CM spoke with Margreta Journey at [facility], who confirmed that patient's insurance is accepted there. Requested clinicals were faxed. Office to call patient with appointment date/time.  Information was added to AVS. Bedside RN updated.  Expected Discharge Date:                  Expected Discharge Plan:  Home/Self Care  In-House Referral:     Discharge planning Services  CM Consult  Post Acute Care Choice:    Choice offered to:  Patient  DME Arranged:    DME Agency:     HH Arranged:    [place] Agency:     Status of Service:  Completed, signed off  Medicare Important Message Given:    Date Medicare IM Given:    Medicare IM give by:    Date Additional Medicare IM Given:    Additional Medicare Important Message give by:     If discussed at [place] of Stay Meetings, dates discussed:    Additional Comments:  Rolm Baptise, RN 12/16/2014, 2:50 PM

## 2014-12-16 NOTE — Progress Notes (Signed)
Video bronchoscopy with washing intervention, brushing intervention, biopsies intervention. All vitals good thru out procedure. Report called to Vilinda Blanks RN.

## 2014-12-16 NOTE — Discharge Instructions (Signed)
Seizure, Adult A seizure is abnormal electrical activity in the brain. Seizures usually last from 30 seconds to 2 minutes. There are various types of seizures. Before a seizure, you may have a warning sensation (aura) that a seizure is about to occur. An aura may include the following symptoms:   Fear or anxiety.  Nausea.  Feeling like the room is spinning (vertigo).  Vision changes, such as seeing flashing lights or spots. Common symptoms during a seizure include:  A change in attention or behavior (altered mental status).  Convulsions with rhythmic jerking movements.  Drooling.  Rapid eye movements.  Grunting.  Loss of bladder and bowel control.  Bitter taste in the mouth.  Tongue biting. After a seizure, you may feel confused and sleepy. You may also have an injury resulting from convulsions during the seizure. HOME CARE INSTRUCTIONS   If you are given medicines, take them exactly as prescribed by your health care provider.  Keep all follow-up appointments as directed by your health care provider.  Do not swim or drive or engage in risky activity during which a seizure could cause further injury to you or others until your health care provider says it is OK.  Get adequate rest.  Teach friends and family what to do if you have a seizure. They should:  Lay you on the ground to prevent a fall.  Put a cushion under your head.  Loosen any tight clothing around your neck.  Turn you on your side. If vomiting occurs, this helps keep your airway clear.  Stay with you until you recover.  Know whether or not you need emergency care. SEEK IMMEDIATE MEDICAL CARE IF:  The seizure lasts longer than 5 minutes.  The seizure is severe or you do not wake up immediately after the seizure.  You have an altered mental status after the seizure.  You are having more frequent or worsening seizures. Someone should drive you to the emergency department or call local emergency  services (911 in [place] MAKE SURE YOU:  Understand these instructions.  Will watch your condition.  Will get help right away if you are not doing well or get worse.   This information is not intended to replace advice given to you by your health care provider. Make sure you discuss any questions you have with your health care provider.   Document Released: 12/25/1999 Document Revised: 01/17/2014 Document Reviewed: 08/08/2012 Elsevier Interactive Patient Education 2016 [place].  Metastatic Brain Tumor A metastatic brain tumor is a growth in your brain. It is made of cancer cells from another part of your body that traveled to your brain through your bloodstream, along the nerves of your brain (cranial nerves), or through the opening at the base of your skull.  CAUSES  The most common cause of a metastatic brain tumor in men is lung cancer. In women, it is breast cancer. Other common causes include:  Unknown types of cancers.  Skin cancer (melanoma).  Colon cancer.  Kidney cancer. SIGNS AND SYMPTOMS  Most signs and symptoms of metastatic brain tumors are caused by increased pressure inside your brain. A headache is often the first symptom. Eventually, almost everyone with a metastatic brain tumor has a headache. Other signs and symptoms include:  Vomiting.  Weakness or fatigue.  Seizures.  Sensory problems, like tingling or numbness.  Problems walking.  Clumsiness.  Emotional changes.  Personality changes.  Changes in speech or vision. DIAGNOSIS  Your health care provider can diagnose a metastatic  brain tumor from your medical history and physical exam. You may also have some additional tests, including:  A nervous system function test (neurologic exam).  Imaging studies of your brain, such as an MRI and CT scan.  Having a small piece of tissue removed (biopsy) from your brain to be checked under a microscope. TREATMENT  Treatment depends on the type of  cancer you have and your overall health. It also depends on the size of your tumor and the number of brain tumors you have. The most common treatments include:  Medicines to control pain, nausea, and seizures.  Cancer-killing drugs (chemotherapy).  An X-ray treatment called radiation therapy to kill brain tumor cells.  A type of radiation therapy that is targeted to exact locations in the brain (stereotactic radiotherapy).  Surgery to remove brain tumors. HOME CARE INSTRUCTIONS  Only take medicines as directed by your health care provider.  Do not drive if:  You are taking strong pain medicines.  Your vision or thinking is not clear.  Take two 10-minute walks every day if you are able. Exercising is a good way to relieve stress. It can also help you sleep.  Be sure to get enough calories and protein in your diet.  If you have a poor appetite or feel nauseous, eat small meals often.  Supplement your diet with protein shakes or milk shakes.  It is common to have anxiety and depression when you are coping with cancer.  Talk to friends and loved ones about your feelings.  Have a good support system at home. SEEK MEDICAL CARE IF:  You have chills or fever.  Your medicine is not controlling your symptoms.  Your symptoms get worse or you develop new symptoms.  You are having trouble caring for yourself or being cared for at home.  You are struggling with anxiety or depression. SEEK IMMEDIATE MEDICAL CARE IF:  You cannot stop vomiting.  You cannot keep down any foods or fluids.  You have sudden changes in speech or vision.  You have a seizure.  You can no longer take care of yourself at home.   This information is not intended to replace advice given to you by your health care provider. Make sure you discuss any questions you have with your health care provider.   Document Released: 09/23/2003 Document Revised: 01/17/2014 Document Reviewed: 12/18/2012 Elsevier  Interactive Patient Education [employer].

## 2014-12-16 NOTE — Op Note (Signed)
Video Bronchoscopy Procedure Note  Date of Operation: 12/16/2014  Pre-op Diagnosis:   Post-op Diagnosis: Same  Surgeon: Baltazar Apo  Assistants: none  Anesthesia: conscious sedation, moderate sedation  Meds Given: fentanyl 221mg, versed '8mg'$  in divided doses, 1% lidocaine 25cc total  Operation: Flexible video fiberoptic bronchoscopy and biopsies.  Estimated Blood Loss: 184TX Complications: none noted  Indications and History: Timothy Duddyis 54y.o. with history of tobacco use, found to have a brain mass after seizure. CCT chest identified LLL superior segmental lesion concerning for malignancy.  Recommendation was to perform video fiberoptic bronchoscopy with biopsies. The risks, benefits, complications, treatment options and expected outcomes were discussed with the patient.  The possibilities of pneumothorax, pneumonia, reaction to medication, pulmonary aspiration, perforation of a viscus, bleeding, failure to diagnose a condition and creating a complication requiring transfusion or operation were discussed with the patient who freely signed the consent.    Description of Procedure: The patient was seen in the Preoperative Area, was examined and was deemed appropriate to proceed.  [facility] the procedure verified as Flexible Video Fiberoptic Bronchoscopy.  A Time Out was held and the above information confirmed.   Conscious sedation was initiated as indicated above. The video fiberoptic bronchoscope was introduced via the L nare and a general inspection was performed which showed normal cords, normal trachea, normal main carina. The R sided airways were inspected and showed normal RUL, BI, RML and RLL. The L side was then inspected. The LLL, Lingular and LUL airways were normal.   Under fluoro guidance transbronchial brushings and transbronchial biopsies were taken from the LLL superior segment.  There was  no significant bleeding noted. Finally endobronchial washings were performed in the LLL superior segmenment to be sent for cytology. The patient tolerated the procedure well. The bronchoscope was removed. There were no obvious complications.   Samples: 1. Transbronchial brushings from LLL 2. Transbronchial forceps biopsies from LLL 3. Bronchial washings from LLL  Plans:  We will review the cytology, pathology and microbiology results with the patient when they become available.  Outpatient followup will be with Dr BLamonte Sakai    RBaltazar Apo MD, PhD 12/16/2014, 7:49 AM [facility] [phone] if no answer [phone]

## 2014-12-16 NOTE — Progress Notes (Signed)
Pt for discharge home today. Discharge orders received. IV dcd. Discharge instructions and prescriptions given with verbalized understanding. Family at bedside to assist with discharge. Staff brought patient to lobby via wheelchair at 1415.   Transported to home by family member.

## 2014-12-16 NOTE — Progress Notes (Signed)
This is a  Follow up visit to complete advance directive.  Document was complete and a copy was placed in patient chart by nurse.  The original copy  Was given to patient and two additional copies for healthcare agents. Patient being discharged.

## 2014-12-16 NOTE — Discharge Summary (Signed)
Physician Discharge Summary  Timothy Obrien BDZ:[id number] DOB: October 06, 1960 DOA: 12/12/2014  PCP: Pcp Not In System  Admit date: 12/12/2014 Discharge date: 12/16/2014  Time spent: Greater than 30 minutes  Recommendations for Outpatient Follow-up:  1. PCP at [place] at [facility], in 1 week 2. Dr. Baltazar Apo, Pulmonology: He will contact patient on Thursday 12/18/14 with pathology results and further care plan. 3. Dr. Marland Kitchen Ditty, Neurosurgery 4. Dr. Tyler Pita, Radiation Oncology   Discharge Diagnoses:  Active Problems:   Metastasis to brain [facility])   Right sided weakness   Brain metastases ([place])   Acute right-sided weakness   Discharge Condition: Improved & Stable  Diet recommendation: Heart healthy diet.  Filed Weights   12/12/14 1311 12/12/14 2216 12/16/14 0621  Weight: 71.923 kg (158 lb 9 oz) 71.215 kg (157 lb) 71.215 kg (157 lb)    History of present illness:  54 year old male patient with no significant past medical history, ongoing tobacco abuse, presented to [facility] ED on 12/12/14 with approximately 10 days history of intermittent right-sided involuntary movements 2 and 5 days history of right lower extremity weakness. No headaches, visual symptoms, slurred speech or difficulty swallowing. CT and MRI brain confirmed brain mass suspicious for metastasis with vasogenic edema. CT chest abdomen and pelvis confirmed left lower lobe lung mass. Neurosurgery consulted. Pulmonology consulted for bronchoscopy biopsy. Radiation oncology aware and will see after diagnosis established by biopsy.Marland Kitchen  Hospital Course:   Left frontal cystic mass with right hemiparesis/suspect metastatic lesion - May be from primary lung cancer (left lower lobe mass on CT chest) versus other etiologies - Neurosurgery input appreciated and recommend tissue diagnosis to guide treatment decision of radiation + surgery versus radiation alone - In the hospital he was treated with IV Decadron - PT and OT  evaluation: Recommend outpatient PT and no OT follow-up. - May need further evaluation of right superior pole kidney lesion seen on CT abdomen - Pulmonology consultation appreciated. Patient underwent bronchoscopy and biopsy by Dr. Lamonte Sakai on 12/16/14. Chest x-ray: No pneumothorax. Discussed with Dr. Lamonte Sakai who has cleared patient for discharge home and will call patient/family on Thursday with results. - Dr. Lamonte Sakai has consulted radiation oncology who will see patient after definitive biopsy diagnosis. - Neurosurgery/Dr. Ditty has also cleared patient for discharge home on oral Decadron, Keppra and PPI prophylaxis. - Patient has been extensively counseled that he should not drive until cleared by physicians evaluating him in the outpatient setting, especially given history of new onset of seizures.  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 - No driving until cleared by outpatient physicians during further evaluation.  Tobacco abuse - Cessation counseled. Continue nicotine patch.  Anxiety state - Continue when necessary clonazepam. Patient and Ex spouse request prescription for clonazepam. Advised him that he will be provided with a very short supply of clonazepam until he is able to see his outpatient physicians. He has been advised not to take this medication and drive or take it along with other sedative medications or alcohol.   Discussed with patient's Ex spouse at bedside.   Consultants:  Neurosurgery  Pulmonology  Procedures:  Flexible video fiberoptic bronchoscopy and biopsy on 12/16/14  Antibiotics:  None   Discharge Exam:  Complaints: Patient was seen after bronchoscopy. He denied complaints and was anxious to go home.  Filed Vitals:   12/16/14 0810 12/16/14 0815 12/16/14 0820 12/16/14 1021  BP: 152/107 149/94 127/79  127/87  Pulse: 62 57 47 57  Temp:    97 F (36.1 C)  TempSrc:     Oral  Resp: '18 15 17 18  '$ Height:      Weight:      SpO2: 96% 96% 97% 99%    General exam: Moderately built and thinly nourished pleasant young male sitting up comfortably in bed. Respiratory system: Clear. No increased work of breathing. Cardiovascular system: S1 & S2 heard, RRR. No JVD, murmurs, gallops, clicks or pedal edema. Gastrointestinal system: Abdomen is nondistended, soft and nontender. Normal bowel sounds heard. Central nervous system: Alert and oriented. No focal neurological deficits. Extremities: 5 x 5 power in left limbs and 4 x 5 power in right limbs, RLE weaker than RUE  Discharge Instructions      Discharge Instructions    Call MD for:  difficulty breathing, headache or visual disturbances    Complete by:  As directed      Call MD for:  extreme fatigue    Complete by:  As directed      Call MD for:  persistant dizziness or light-headedness    Complete by:  As directed      Call MD for:  persistant nausea and vomiting    Complete by:  As directed      Call MD for:  severe uncontrolled pain    Complete by:  As directed      Call MD for:    Complete by:  As directed   Involuntary movements or seizure-like activity.     Diet - low sodium heart healthy    Complete by:  As directed      Driving Restrictions    Complete by:  As directed   Do not drive until cleared by your physician.     Increase activity slowly    Complete by:  As directed             Medication List    STOP taking these medications        ibuprofen 200 MG tablet  Commonly known as:  ADVIL,MOTRIN      TAKE these medications        acetaminophen 325 MG tablet  Commonly known as:  TYLENOL  Take 2 tablets (650 mg total) by mouth every 6 (six) hours as needed for mild pain, moderate pain, fever or headache (or Fever >/= 101).     clonazePAM 0.5 MG tablet  Commonly known as:  KLONOPIN  Take 1 tablet (0.5 mg total) by mouth 3 (three) times daily as needed for anxiety.      dexamethasone 4 MG tablet  Commonly known as:  DECADRON  Take 1 tablet (4 mg total) by mouth 4 (four) times daily.     feeding supplement (ENSURE ENLIVE) Liqd  Take 237 mLs by mouth 2 (two) times daily between meals.     levETIRAcetam 750 MG tablet  Commonly known as:  KEPPRA  Take 1 tablet (750 mg total) by mouth 2 (two) times daily.     nicotine 21 mg/24hr patch  Commonly known as:  NICODERM CQ - dosed in mg/24 hours  Place 1 patch (21 mg total) onto the skin daily.     pantoprazole 20 MG tablet  Commonly known as:  PROTONIX  Take 1 tablet (20 mg total) by mouth daily.     UNABLE TO FIND  Outpatient physical therapy. 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  Follow-up Information    Follow up with Family physician at [place] at [facility]. Schedule an appointment as soon as possible for a visit in 1 week.      Schedule an appointment as soon as possible for a visit with Collene Gobble., MD.   Specialty:  Pulmonary Disease   Why:  MD will call you on 12/18/2014   Contact information:   [address] [place] [address] [phone]       Schedule an appointment as soon as possible for a visit with Tamala Fothergill, MD.   Specialty:  Neurosurgery   Contact information:   [place] [address] [phone]       Schedule an appointment as soon as possible for a visit with Tyler Pita, MD.   Specialty:  Radiation Oncology   Contact information:   [place] [address] [phone]        The results of significant diagnostics from this hospitalization (including imaging, microbiology, ancillary and laboratory) are listed below for reference.    Significant Diagnostic Studies: Ct Head Wo Contrast  12/12/2014  CLINICAL DATA:  54 year old with right arm and leg numbness and weakness today. Initial encounter. EXAM: CT HEAD WITHOUT CONTRAST TECHNIQUE: Contiguous axial images were  obtained from the base of the skull through the vertex without intravenous contrast. COMPARISON:  None. FINDINGS: There is a 3.0 x 2.7 cm well-circumscribed low-density left frontal lobe mass with moderate surrounding vasogenic edema. This mass demonstrates no evidence of hemorrhage or calcification. There is no associated midline shift or hydrocephalus. No other masses are identified. There is no evidence of acute stroke. The visualized paranasal sinuses, mastoid air cells and middle ears are otherwise clear. The calvarium is intact. IMPRESSION: 1. Large left frontal lobe low-density mass with surrounding vasogenic edema. This appearance is most consistent with an isolated metastasis. Additional considerations include primary brain tumor and cerebral abscess. 2. No evidence of acute intracranial hemorrhage, midline shift, hydrocephalus or acute stroke. 3. Further evaluation with MRI without and with contrast recommended. 4. These results were called by telephone at the time of interpretation on 12/12/2014 at 2:00 pm to Timothy Obrien , who verbally acknowledged these results. Electronically Signed   By: Richardean Sale M.D.   On: 12/12/2014 14:07   Ct Chest W Contrast  12/13/2014  CLINICAL DATA:  MRI showing a 3-4 cm intracranial mass favored to represent solitary metastasis, possibly endocrine or gastrointestinal origin. EXAM: CT CHEST, ABDOMEN, AND PELVIS WITH CONTRAST TECHNIQUE: Multidetector CT imaging of the chest, abdomen and pelvis was performed following the standard protocol during bolus administration of intravenous contrast. CONTRAST:  14m OMNIPAQUE IOHEXOL 300 MG/ML  SOLN COMPARISON:  None. FINDINGS: CT CHEST FINDINGS Mediastinum/Lymph Nodes: Scattered small lymph nodes within the mediastinum. No mass or enlarged lymph nodes within the mediastinum or perihilar regions. Thoracic aorta is normal in caliber and configuration. Heart size is normal. No pericardial effusion. Lungs/Pleura: Masslike  consolidation within the superior segment of the left lower lobe, measuring 3.7 x 3.7 cm. Emphysematous changes noted within the upper lobes bilaterally, moderate in degree. Mild atelectasis noted at each lung base. Musculoskeletal: No chest wall mass or suspicious bone lesions identified. CT ABDOMEN PELVIS FINDINGS Hepatobiliary: No masses or other significant abnormality. Pancreas: No mass, inflammatory changes, or other significant abnormality. Spleen: Within normal limits in size and appearance. Adrenals/Urinary Tract: 8 mm hypodense lesion within the superior pole of the right kidney, too small  to characterize but most likely a small benign cyst. Kidneys otherwise unremarkable bilaterally without stone or hydronephrosis. Left adrenal gland is somewhat bulbous in configuration but without discrete mass. Right adrenal gland appears normal. Stomach/Bowel: Bowel is normal in caliber. No evidence of bowel wall thickening or bowel wall inflammation seen. Appendix is normal. Stomach is moderately distended with recently ingested materials but otherwise unremarkable. Vascular/Lymphatic: No pathologically enlarged lymph nodes. No evidence of abdominal aortic aneursym. Atherosclerotic changes are seen along the walls of the normal-caliber abdominal aorta. Reproductive: No mass or other significant abnormality. Other: No free fluid or soft tissue mass is identified within the abdomen or pelvis. No free intraperitoneal air. Musculoskeletal: No suspicious bone lesions identified. Mild degenerative changes seen within the lumbar spine but no acute osseous abnormality. IMPRESSION: 1. Masslike consolidation within the superior segment of the left lower lobe, measuring 3.7 x 3.7 cm, highly suspicious for neoplastic pulmonary mass. 2. No other evidence of neoplastic process seen within the chest, abdomen or pelvis. No acute findings. 3. 8 mm hypodense lesion within the superior pole of the right kidney, too small to characterize  but most likely a small benign cyst. Given the presumed intracranial metastasis and today's masslike consolidation in the left lower lobe, would consider follow-up renal protocol CT or MRI at some point to ensure benignity. Electronically Signed   By: Franki Cabot M.D.   On: 12/13/2014 08:28   Mr [name] EX Contrast  12/12/2014  CLINICAL DATA:  Transient right-sided symptoms which have become worse, now with right-sided weakness. History of right-sided twitching. EXAM: MRI HEAD WITHOUT AND WITH CONTRAST TECHNIQUE: Multiplanar, multiecho pulse sequences of the brain and surrounding structures were obtained without and with intravenous contrast. CONTRAST:  21m MULTIHANCE GADOBENATE DIMEGLUMINE 529 MG/ML IV SOLN COMPARISON:  CT head earlier today. FINDINGS: A superficial, LEFT posterior frontal intra-axial mass is redemonstrated. Measurements are 30 x 32 x 36 mm (R-L x A-P x C-C). The lesion shows no diffusion restriction, and no significant susceptibility except perhaps a tiny area on its most superficial aspect. The lesion is T2 and FLAIR hyperintense, displays prominent T1 shortening, and is surrounded by a thin but somewhat irregular enhancing rim. There is mild vasogenic edema. There is no significant left-to-right shift.No other similar lesions are seen. Otherwise normal-appearing brain. Normal cerebral volume without significant white matter disease. Flow voids are maintained throughout the major intracranial vessels. No midline abnormality. No osseous findings. Extracranial soft tissues unremarkable. IMPRESSION: LEFT posterior frontal superficial intra-axial mass with peripheral enhancement, measuring 30 x 32 x 36 mm. The lesion appears cystic on CT, but displays prominent T1 shortening centrally on MR, suggesting mucin or proteinaceous content. No diffusion restriction to suggest abscess. A solitary metastasis is favored, possibly of endocrine or gastrointestinal origin. Given the lack of susceptibility  on gradient sequence, melanoma is less likely but not excluded. Electronically Signed   By: JStaci RighterM.D.   On: 12/12/2014 18:28   Ct Abdomen Pelvis W Contrast  12/13/2014  CLINICAL DATA:  MRI showing a 3-4 cm intracranial mass favored to represent solitary metastasis, possibly endocrine or gastrointestinal origin. EXAM: CT CHEST, ABDOMEN, AND PELVIS WITH CONTRAST TECHNIQUE: Multidetector CT imaging of the chest, abdomen and pelvis was performed following the standard protocol during bolus administration of intravenous contrast. CONTRAST:  864mOMNIPAQUE IOHEXOL 300 MG/ML  SOLN COMPARISON:  None. FINDINGS: CT CHEST FINDINGS Mediastinum/Lymph Nodes: Scattered small lymph nodes within the mediastinum. No mass or enlarged lymph nodes within the mediastinum or  perihilar regions. Thoracic aorta is normal in caliber and configuration. Heart size is normal. No pericardial effusion. Lungs/Pleura: Masslike consolidation within the superior segment of the left lower lobe, measuring 3.7 x 3.7 cm. Emphysematous changes noted within the upper lobes bilaterally, moderate in degree. Mild atelectasis noted at each lung base. Musculoskeletal: No chest wall mass or suspicious bone lesions identified. CT ABDOMEN PELVIS FINDINGS Hepatobiliary: No masses or other significant abnormality. Pancreas: No mass, inflammatory changes, or other significant abnormality. Spleen: Within normal limits in size and appearance. Adrenals/Urinary Tract: 8 mm hypodense lesion within the superior pole of the right kidney, too small to characterize but most likely a small benign cyst. Kidneys otherwise unremarkable bilaterally without stone or hydronephrosis. Left adrenal gland is somewhat bulbous in configuration but without discrete mass. Right adrenal gland appears normal. Stomach/Bowel: Bowel is normal in caliber. No evidence of bowel wall thickening or bowel wall inflammation seen. Appendix is normal. Stomach is moderately distended with  recently ingested materials but otherwise unremarkable. Vascular/Lymphatic: No pathologically enlarged lymph nodes. No evidence of abdominal aortic aneursym. Atherosclerotic changes are seen along the walls of the normal-caliber abdominal aorta. Reproductive: No mass or other significant abnormality. Other: No free fluid or soft tissue mass is identified within the abdomen or pelvis. No free intraperitoneal air. Musculoskeletal: No suspicious bone lesions identified. Mild degenerative changes seen within the lumbar spine but no acute osseous abnormality. IMPRESSION: 1. Masslike consolidation within the superior segment of the left lower lobe, measuring 3.7 x 3.7 cm, highly suspicious for neoplastic pulmonary mass. 2. No other evidence of neoplastic process seen within the chest, abdomen or pelvis. No acute findings. 3. 8 mm hypodense lesion within the superior pole of the right kidney, too small to characterize but most likely a small benign cyst. Given the presumed intracranial metastasis and today's masslike consolidation in the left lower lobe, would consider follow-up renal protocol CT or MRI at some point to ensure benignity. Electronically Signed   By: Franki Cabot M.D.   On: 12/13/2014 08:28   Dg Chest Port 1 View  12/16/2014  CLINICAL DATA:  Status post bronchoscopy EXAM: PORTABLE CHEST 1 VIEW COMPARISON:  12/13/2014 FINDINGS: Cardiomediastinal silhouette is stable. Persistent masslike consolidation left suprahilar posteromedially. No acute infiltrate or pulmonary edema. There is no pneumothorax. IMPRESSION: Persistent masslike consolidation left suprahilar posterior medially. No active disease. No pneumothorax. Electronically Signed   By: Lahoma Crocker M.D.   On: 12/16/2014 08:12   Dg C-arm Bronchoscopy  12/16/2014  CLINICAL DATA:  C-ARM BRONCHOSCOPY Fluoroscopy was utilized by the requesting physician.  No radiographic interpretation.    Microbiology: No results found for this or any previous visit  (from the past 240 hour(s)).   Labs: Basic Metabolic Panel:  Recent Labs Lab 12/12/14 1459 12/13/14 0230  NA 138 137  K 4.1 4.7  CL 104 105  CO2 26 24  GLUCOSE 106* 128*  BUN 14 13  CREATININE 0.91 0.92  CALCIUM 9.1 8.9  MG  --  2.3  PHOS  --  2.9   Liver Function Tests:  Recent Labs Lab 12/12/14 1459 12/13/14 0230  AST 34 27  ALT 22 19  ALKPHOS 80 71  BILITOT 0.7 0.8  PROT 6.8 6.3*  ALBUMIN 3.7 3.1*   No results for input(s): LIPASE, AMYLASE in the last 168 hours. No results for input(s): AMMONIA in the last 168 hours. CBC:  Recent Labs Lab 12/12/14 1459 12/13/14 0230  WBC 10.3 7.2  NEUTROABS 6.1  --  HGB 16.0 14.5  HCT 46.3 43.0  MCV 98.5 99.1  PLT 290 271   Cardiac Enzymes: No results for input(s): CKTOTAL, CKMB, CKMBINDEX, TROPONINI in the last 168 hours. BNP: BNP (last 3 results) No results for input(s): BNP in the last 8760 hours.  ProBNP (last 3 results) No results for input(s): PROBNP in the last 8760 hours.  CBG: No results for input(s): GLUCAP in the last 168 hours.   Signed:  Vernell Leep, MD, FACP, FHM. [facility] Pager [phone]  If 7PM-7AM, please contact night-coverage [email or web address] Password [email or web address] 12/16/2014, 1:19 PM

## 2014-12-17 ENCOUNTER — Telehealth: Payer: Self-pay | Admitting: Emergency Medicine

## 2014-12-17 ENCOUNTER — Encounter (HOSPITAL_COMMUNITY): Payer: Self-pay | Admitting: Emergency Medicine

## 2014-12-17 DIAGNOSIS — C3492 Malignant neoplasm of unspecified part of left bronchus or lung: Secondary | ICD-10-CM

## 2014-12-17 NOTE — Telephone Encounter (Signed)
Called and spoke with pt's POA. Informed her of RB's recs. She voiced understanding and had no further questions. Nothing further needed.

## 2014-12-17 NOTE — Telephone Encounter (Signed)
Completely understand results so she requests to be contacted with the results as well.  She states she will be scheduling his appointments and spoke to Dr. Lamonte Sakai yesterday at the hospital.  She can be reached at [phone].

## 2014-12-17 NOTE — Telephone Encounter (Signed)
Spoke with pt's POA, requesting to speak to RB about pt's dx and what is next.  Pt's wife also states that pt has been scheduled for an appt with our office next Thursday.  I advised that there is no future appt scheduled for this pt.  RB please advise if you can speak to pt's POA regarding dx, and clarify if this pt needs an appt with you.  Thanks!

## 2014-12-17 NOTE — Telephone Encounter (Signed)
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 He understands the dx. I will refer him to [facility] to plan XRT and other possible therapy. He will wait for our office to call w the appointment details.

## 2014-12-17 NOTE — Telephone Encounter (Signed)
Please let her know that I spoke to Mr Goynes today, reviewed results w him, did show lung cancer. I believe he understands the results and the plan. I have also asked [facility] to set up an OV for him at the [facility].

## 2014-12-18 ENCOUNTER — Encounter: Payer: Self-pay | Admitting: Radiation Therapy

## 2014-12-18 ENCOUNTER — Other Ambulatory Visit: Payer: Self-pay | Admitting: Neurological Surgery

## 2014-12-18 ENCOUNTER — Other Ambulatory Visit: Payer: Self-pay | Admitting: Radiation Therapy

## 2014-12-18 ENCOUNTER — Telehealth: Payer: Self-pay | Admitting: *Deleted

## 2014-12-18 DIAGNOSIS — C7931 Secondary malignant neoplasm of brain: Secondary | ICD-10-CM

## 2014-12-18 LAB — CULTURE, BAL-QUANTITATIVE

## 2014-12-18 LAB — CULTURE, BAL-QUANTITATIVE W GRAM STAIN
Colony Count: 80000
Culture: NORMAL

## 2014-12-18 NOTE — Telephone Encounter (Signed)
Oncology Nurse Navigator Documentation  Oncology Nurse Navigator Flowsheets 12/18/2014  Referral date to RadOnc/MedOnc 12/17/2014  Navigator Encounter Type Introductory phone call/I received referral yesterday.  I spoke with Mont Dutton regarding patient.  Patient is set up for an appt after SRS.  I called and left him a vm message regarding appt.  I also left my name and phone number to call with any questions.   Patient Visit Type Initial  Treatment Phase Abnormal Scans  Interventions Coordination of Care  Coordination of Care MD Appointments  Time Spent with Patient 15

## 2014-12-18 NOTE — Progress Notes (Signed)
1.  Do you need a wheel chair?    no  2. On oxygen? no  3. Have you ever had any surgery in the body part being scanned? no  4. Have you ever had any surgery on your brain or heart?  no                5. Have you ever had surgery on your eyes or ears?    no                                     6. Do you have a pacemaker or defibrillator?  no   7. Do you have a Neurostimulator?  no  8. Claustrophobic?  no  9. Any risk for metal in eyes?  no  10. Injury by bullet, buckshot, or shrapnel?  no  11. Stent?    no                                                                                                                    12. Hx of Cancer?     Yes. Lung Cancer with mets to the brain                                                                                                          13. Kidney or Liver disease?  no  14. Hx of Lupus, Rheumatoid Arthritis or Scleroderma?  no  15. IV Antibiotics or long term use of NSAIDS?   no  16. HX of Hypertension?  no  17. Diabetes?   no  18. Allergy to contrast?   no  19. Recent labs. Labs drawn 12/3  In EPIC No previous radiation  Answers given over the phone 12/18/14  Timothy Obrien

## 2014-12-19 ENCOUNTER — Ambulatory Visit (HOSPITAL_COMMUNITY)
Admission: RE | Admit: 2014-12-19 | Discharge: 2014-12-19 | Disposition: A | Discharge status: Home / Self Care | Payer: Managed Care, Other (non HMO) | Source: Ambulatory Visit | Attending: Radiation Oncology | Admitting: Radiation Oncology

## 2014-12-19 DIAGNOSIS — R220 Localized swelling, mass and lump, head: Secondary | ICD-10-CM | POA: Insufficient documentation

## 2014-12-19 DIAGNOSIS — G930 Cerebral cysts: Secondary | ICD-10-CM | POA: Diagnosis not present

## 2014-12-19 DIAGNOSIS — R609 Edema, unspecified: Secondary | ICD-10-CM | POA: Insufficient documentation

## 2014-12-19 DIAGNOSIS — C7931 Secondary malignant neoplasm of brain: Secondary | ICD-10-CM | POA: Diagnosis present

## 2014-12-19 MED ORDER — GADOBENATE DIMEGLUMINE 529 MG/ML IV SOLN
15.0000 mL | Freq: Once | INTRAVENOUS | Status: AC | PRN
  Administered 2014-12-19: 15 mL via INTRAVENOUS

## 2014-12-22 ENCOUNTER — Encounter: Payer: Self-pay | Admitting: Radiation Oncology

## 2014-12-22 ENCOUNTER — Ambulatory Visit
Admission: RE | Admit: 2014-12-22 | Discharge: 2014-12-22 | Disposition: A | Discharge status: Home / Self Care | Payer: Managed Care, Other (non HMO) | Source: Ambulatory Visit | Attending: Radiation Oncology | Admitting: Radiation Oncology

## 2014-12-22 VITALS — BP 145/78 | HR 73 | Resp 16 | Ht 72.0 in | Wt 161.5 lb

## 2014-12-22 DIAGNOSIS — C3432 Malignant neoplasm of lower lobe, left bronchus or lung: Secondary | ICD-10-CM | POA: Insufficient documentation

## 2014-12-22 DIAGNOSIS — Z803 Family history of malignant neoplasm of breast: Secondary | ICD-10-CM | POA: Diagnosis not present

## 2014-12-22 DIAGNOSIS — C7931 Secondary malignant neoplasm of brain: Secondary | ICD-10-CM

## 2014-12-22 DIAGNOSIS — Z8261 Family history of arthritis: Secondary | ICD-10-CM | POA: Diagnosis not present

## 2014-12-22 DIAGNOSIS — Z85118 Personal history of other malignant neoplasm of bronchus and lung: Secondary | ICD-10-CM | POA: Insufficient documentation

## 2014-12-22 DIAGNOSIS — F17210 Nicotine dependence, cigarettes, uncomplicated: Secondary | ICD-10-CM | POA: Insufficient documentation

## 2014-12-22 DIAGNOSIS — Z510 Encounter for antineoplastic radiation therapy: Secondary | ICD-10-CM | POA: Diagnosis present

## 2014-12-22 DIAGNOSIS — Z8371 Family history of colonic polyps: Secondary | ICD-10-CM | POA: Diagnosis not present

## 2014-12-22 NOTE — Progress Notes (Signed)
Radiation Oncology         [phone] ________________________________  Initial Outpatient Consultation  Name: Timothy Obrien MRN: [id number]  Date: 12/22/2014  DOB: Jan 06, 1961  CC:Pcp Not In System  Ditty, Kevan Ny, *   REFERRING PHYSICIAN: Ditty, Kevan Ny, *  DIAGNOSIS: 54 y.o. gentleman with a 3.6 cm left frontal brain metastasis from non-small cell lung cancer of the left lower lobe of the lung    ICD-9-CM ICD-10-CM   1. Brain metastases ([place]) 198.3 C79.31 NM PET Image Initial (PI) Skull Base To Thigh    HISTORY OF PRESENT ILLNESS::Timothy Obrien is a 54 y.o. male who had a seizure a week before Thanksgiving with right sided leg cramping and hand tremor. He pulled over and it resolved. He reports having a second seizure while getting out of the tub. He fell and was unable to get off of the floor due to right sided "paralysis." After this episode, he presented to the ER on 12/2. CT of the head showed a 3.0 x 2.7 cm left frontal lobe mass with moderate surrounding vasogenic edema. Subsequent brain MRI confirmed a left posterior frontal superficial intra-axial mass with peripheral enhancement, measuring 3.0 x 3.2 x 3.6 cm. CT of the chest, abdomen, and pelvis showed a mass-like consolidation in the superior segment of the left lower lobe, measuring 3.7 x 3.7 cm, that is suspicious for a neoplastic pulmonary mass and a 8 mm hypodense lesion within the superior pole of the right kidney. On 12/6, he had a transbronchial biopsy of the left lower lobe of the lung, by Dr. Lamonte Sakai, showing extremely scant lung tissue with focal epithelial atypia.  Lavage showed non-small cell lung cancer. Repeat brain MRI, 3 Tesla, on 12/9 showed a stable solitary left superior frontal gyrus cystic mass with rim enhancement measuring up to 3.7 cm. There was stable mild surrounding edema and regional mass effect. However, no other brain metastases were identified. The patient presents to me today  for the consideration of radiotherapy for the management of his disease.  PREVIOUS RADIATION THERAPY: No  PAST MEDICAL HISTORY:  has a past medical history of Lung cancer ([place]).    PAST SURGICAL HISTORY: Past Surgical History  Procedure Laterality Date  . Hernia repair    . Vasectomy    . Video bronchoscopy Bilateral 12/16/2014    Procedure: VIDEO BRONCHOSCOPY WITH FLUORO;  Surgeon: Collene Gobble, MD;  Location: [facility];  Service: Cardiopulmonary;  Laterality: Bilateral;    FAMILY HISTORY: family history includes Arthritis in his father; Breast cancer in his mother; Colon polyps in his mother.  SOCIAL HISTORY:  Social History   Social History  . Marital Status: Divorced    Spouse Name: N/A  . Number of Children: N/A  . Years of Education: N/A   Occupational History  . Not on file.   Social History Main Topics  . Smoking status: Current Every Day Smoker -- 1.50 packs/day for 36 years    Types: Cigarettes    Start date: 12/14/1979  . Smokeless tobacco: Never Used  . Alcohol Use: 0.0 oz/week    0 Standard drinks or equivalent per week     Comment: daily 6 beers a day for the past fefw years.   . Drug Use: No  . Sexual Activity: Not Currently   Other Topics Concern  . 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 He does have a  cat but no other home pets. No mold exposure. Recent travel to [place] but with symptoms at the onset of travel.    ALLERGIES: Review of patient's allergies indicates no known allergies.  MEDICATIONS:  Current Outpatient Prescriptions  Medication Sig Dispense Refill  . acetaminophen (TYLENOL) 325 MG tablet Take 2 tablets (650 mg total) by mouth every 6 (six) hours as needed for mild pain, moderate pain, fever or headache (or Fever >/= 101).    Marland Kitchen dexamethasone (DECADRON) 4 MG tablet Take 1 tablet (4 mg total) by mouth 4 (four) times daily. 120 tablet 0  . feeding  supplement, ENSURE ENLIVE, (ENSURE ENLIVE) LIQD Take 237 mLs by mouth 2 (two) times daily between meals.    . levETIRAcetam (KEPPRA) 750 MG tablet Take 1 tablet (750 mg total) by mouth 2 (two) times daily. 60 tablet 0  . nicotine (NICODERM CQ - DOSED IN MG/24 HOURS) 21 mg/24hr patch Place 1 patch (21 mg total) onto the skin daily. 28 patch 0  . pantoprazole (PROTONIX) 20 MG tablet Take 1 tablet (20 mg total) by mouth daily. 30 tablet 0  . UNABLE TO FIND Outpatient physical therapy. 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 1 Units 0  . clonazePAM (KLONOPIN) 0.5 MG tablet Take 1 tablet (0.5 mg total) by mouth 3 (three) times daily as needed for anxiety. (Patient not taking: Reported on 12/22/2014) 15 tablet 0   No current facility-administered medications for this encounter.    REVIEW OF SYSTEMS:  A 15 point review of systems is documented in the electronic medical record. This was obtained by the nursing staff. However, I reviewed this with the patient to discuss relevant findings and make appropriate changes.  Pertinent items are noted in HPI.   The patient denies headaches or pain. He still reports right sided weakness.   PHYSICAL EXAM:  height is 6' (1.829 m) and weight is 161 lb 8 oz (73.256 kg). His blood pressure is 145/78 and his pulse is 73. His respiration is 16 and oxygen saturation is 100%.  Alert and oriented x3. Appears his stated age.  Per neurosurgery: Awake, alert, oriented Memory and concentration grossly intact Speech fluent, appropriate CN grossly intact Motor exam: Upper Extremities Deltoid Bicep Tricep Grip  Right 4/5 4/5 4/5 4/5  Left 5/5 5/5 5/5 5/5   Lower Extremity IP Quad PF DF EHL  Right 4/5 4/5 4/5 4/5 4/5  Left 5/5 5/5 5/5 5/5 5/5   Sensation grossly intact to LT       KPS = 90  100 - Normal; no complaints; no evidence of disease. 90   - Able to carry on normal activity; minor signs or symptoms of  disease. 80   - Normal activity with effort; some signs or symptoms of disease. 37   - Cares for self; unable to carry on normal activity or to do active work. 60   - Requires occasional assistance, but is able to care for most of his personal needs. 50   - Requires considerable assistance and frequent medical care. 25   - Disabled; requires special care and assistance. 48   - Severely disabled; hospital admission is indicated although death not imminent. 54   - Very sick; hospital admission necessary; active supportive treatment necessary. 10   - Moribund; fatal processes progressing rapidly. 0     - Dead  Karnofsky DA, Abelmann WH, Craver LS and Burchenal [facility] [phone]) The use of the nitrogen mustards in the palliative treatment of  carcinoma: with particular reference to bronchogenic carcinoma Cancer 1 634-56  LABORATORY DATA:  Lab Results  Component Value Date   WBC 7.2 12/13/2014   HGB 14.5 12/13/2014   HCT 43.0 12/13/2014   MCV 99.1 12/13/2014   PLT 271 12/13/2014   Lab Results  Component Value Date   NA 137 12/13/2014   K 4.7 12/13/2014   CL 105 12/13/2014   CO2 24 12/13/2014   Lab Results  Component Value Date   ALT 19 12/13/2014   AST 27 12/13/2014   ALKPHOS 71 12/13/2014   BILITOT 0.8 12/13/2014     RADIOGRAPHY: Ct Head Wo Contrast  12/12/2014  CLINICAL DATA:  54 year old with right arm and leg numbness and weakness today. Initial encounter. EXAM: CT HEAD WITHOUT CONTRAST TECHNIQUE: Contiguous axial images were obtained from the base of the skull through the vertex without intravenous contrast. COMPARISON:  None. FINDINGS: There is a 3.0 x 2.7 cm well-circumscribed low-density left frontal lobe mass with moderate surrounding vasogenic edema. This mass demonstrates no evidence of hemorrhage or calcification. There is no associated midline shift or hydrocephalus. No other masses are identified. There is no evidence of acute stroke. The visualized paranasal sinuses, mastoid  air cells and middle ears are otherwise clear. The calvarium is intact. IMPRESSION: 1. Large left frontal lobe low-density mass with surrounding vasogenic edema. This appearance is most consistent with an isolated metastasis. Additional considerations include primary brain tumor and cerebral abscess. 2. No evidence of acute intracranial hemorrhage, midline shift, hydrocephalus or acute stroke. 3. Further evaluation with MRI without and with contrast recommended. 4. These results were called by telephone at the time of interpretation on 12/12/2014 at 2:00 pm to Dr. Sherwood Gambler , who verbally acknowledged these results. Electronically Signed   By: Richardean Sale M.D.   On: 12/12/2014 14:07   Ct Chest W Contrast  12/13/2014  CLINICAL DATA:  MRI showing a 3-4 cm intracranial mass favored to represent solitary metastasis, possibly endocrine or gastrointestinal origin. EXAM: CT CHEST, ABDOMEN, AND PELVIS WITH CONTRAST TECHNIQUE: Multidetector CT imaging of the chest, abdomen and pelvis was performed following the standard protocol during bolus administration of intravenous contrast. CONTRAST:  66m OMNIPAQUE IOHEXOL 300 MG/ML  SOLN COMPARISON:  None. FINDINGS: CT CHEST FINDINGS Mediastinum/Lymph Nodes: Scattered small lymph nodes within the mediastinum. No mass or enlarged lymph nodes within the mediastinum or perihilar regions. Thoracic aorta is normal in caliber and configuration. Heart size is normal. No pericardial effusion. Lungs/Pleura: Masslike consolidation within the superior segment of the left lower lobe, measuring 3.7 x 3.7 cm. Emphysematous changes noted within the upper lobes bilaterally, moderate in degree. Mild atelectasis noted at each lung base. Musculoskeletal: No chest wall mass or suspicious bone lesions identified. CT ABDOMEN PELVIS FINDINGS Hepatobiliary: No masses or other significant abnormality. Pancreas: No mass, inflammatory changes, or other significant abnormality. Spleen: Within normal  limits in size and appearance. Adrenals/Urinary Tract: 8 mm hypodense lesion within the superior pole of the right kidney, too small to characterize but most likely a small benign cyst. Kidneys otherwise unremarkable bilaterally without stone or hydronephrosis. Left adrenal gland is somewhat bulbous in configuration but without discrete mass. Right adrenal gland appears normal. Stomach/Bowel: Bowel is normal in caliber. No evidence of bowel wall thickening or bowel wall inflammation seen. Appendix is normal. Stomach is moderately distended with recently ingested materials but otherwise unremarkable. Vascular/Lymphatic: No pathologically enlarged lymph nodes. No evidence of abdominal aortic aneursym. Atherosclerotic changes are seen along the walls of  the normal-caliber abdominal aorta. Reproductive: No mass or other significant abnormality. Other: No free fluid or soft tissue mass is identified within the abdomen or pelvis. No free intraperitoneal air. Musculoskeletal: No suspicious bone lesions identified. Mild degenerative changes seen within the lumbar spine but no acute osseous abnormality. IMPRESSION: 1. Masslike consolidation within the superior segment of the left lower lobe, measuring 3.7 x 3.7 cm, highly suspicious for neoplastic pulmonary mass. 2. No other evidence of neoplastic process seen within the chest, abdomen or pelvis. No acute findings. 3. 8 mm hypodense lesion within the superior pole of the right kidney, too small to characterize but most likely a small benign cyst. Given the presumed intracranial metastasis and today's masslike consolidation in the left lower lobe, would consider follow-up renal protocol CT or MRI at some point to ensure benignity. Electronically Signed   By: Franki Cabot M.D.   On: 12/13/2014 08:28   Mr Kizzie Fantasia Contrast  12/19/2014  CLINICAL DATA:  54 year old male with brain metastases, probable lung primary. Stereotactic radiosurgery planning. Subsequent encounter.  EXAM: MRI HEAD WITHOUT AND WITH CONTRAST TECHNIQUE: Multiplanar, multiecho pulse sequences of the brain and surrounding structures were obtained without and with intravenous contrast. CONTRAST:  67m MULTIHANCE GADOBENATE DIMEGLUMINE 529 MG/ML IV SOLN COMPARISON:  Brain MRI 12/12/2014. FINDINGS: 32 mm by 37 mm round mass at the superior left frontal gyrus with T1 isointense cystic internal contents and rim enhancement is stable. Minimal mineralization or hemosiderin in the wall of the lesion. Mild surrounding vasogenic edema considering the lesion size is stable. Mild regional mass effect. No other brain mass identified. Major intracranial vascular flow voids are stable. GPearline Cablesand white matter signal elsewhere are stable. No restricted diffusion or evidence of acute infarction. No ventriculomegaly. No acute intracranial hemorrhage identified. Negative pituitary, cervicomedullary junction and visualized cervical spinal cord. Visible bone marrow signal is within normal limits. Visible internal auditory structures appear normal. Mastoids and paranasal sinuses are stable. Orbit and scalp soft tissues are stable. IMPRESSION: 1. Stable solitary left superior frontal gyrus cystic mass with rim enhancement measuring up to a 3.7 cm. Stable mild surrounding edema and regional mass effect. 2. No other brain mass identified.  No new intracranial abnormality. Electronically Signed   By: HGenevie AnnM.D.   On: 12/19/2014 19:12   Mr BJeri CosWGMContrast  12/12/2014  CLINICAL DATA:  Transient right-sided symptoms which have become worse, now with right-sided weakness. History of right-sided twitching. EXAM: MRI HEAD WITHOUT AND WITH CONTRAST TECHNIQUE: Multiplanar, multiecho pulse sequences of the brain and surrounding structures were obtained without and with intravenous contrast. CONTRAST:  130mMULTIHANCE GADOBENATE DIMEGLUMINE 529 MG/ML IV SOLN COMPARISON:  CT head earlier today. FINDINGS: A superficial, LEFT posterior frontal  intra-axial mass is redemonstrated. Measurements are 30 x 32 x 36 mm (R-L x A-P x C-C). The lesion shows no diffusion restriction, and no significant susceptibility except perhaps a tiny area on its most superficial aspect. The lesion is T2 and FLAIR hyperintense, displays prominent T1 shortening, and is surrounded by a thin but somewhat irregular enhancing rim. There is mild vasogenic edema. There is no significant left-to-right shift.No other similar lesions are seen. Otherwise normal-appearing brain. Normal cerebral volume without significant white matter disease. Flow voids are maintained throughout the major intracranial vessels. No midline abnormality. No osseous findings. Extracranial soft tissues unremarkable. IMPRESSION: LEFT posterior frontal superficial intra-axial mass with peripheral enhancement, measuring 30 x 32 x 36 mm. The lesion appears cystic on CT, but  displays prominent T1 shortening centrally on MR, suggesting mucin or proteinaceous content. No diffusion restriction to suggest abscess. A solitary metastasis is favored, possibly of endocrine or gastrointestinal origin. Given the lack of susceptibility on gradient sequence, melanoma is less likely but not excluded. Electronically Signed   By: Staci Righter M.D.   On: 12/12/2014 18:28   Ct Abdomen Pelvis W Contrast  12/13/2014  CLINICAL DATA:  MRI showing a 3-4 cm intracranial mass favored to represent solitary metastasis, possibly endocrine or gastrointestinal origin. EXAM: CT CHEST, ABDOMEN, AND PELVIS WITH CONTRAST TECHNIQUE: Multidetector CT imaging of the chest, abdomen and pelvis was performed following the standard protocol during bolus administration of intravenous contrast. CONTRAST:  30m OMNIPAQUE IOHEXOL 300 MG/ML  SOLN COMPARISON:  None. FINDINGS: CT CHEST FINDINGS Mediastinum/Lymph Nodes: Scattered small lymph nodes within the mediastinum. No mass or enlarged lymph nodes within the mediastinum or perihilar regions. Thoracic aorta  is normal in caliber and configuration. Heart size is normal. No pericardial effusion. Lungs/Pleura: Masslike consolidation within the superior segment of the left lower lobe, measuring 3.7 x 3.7 cm. Emphysematous changes noted within the upper lobes bilaterally, moderate in degree. Mild atelectasis noted at each lung base. Musculoskeletal: No chest wall mass or suspicious bone lesions identified. CT ABDOMEN PELVIS FINDINGS Hepatobiliary: No masses or other significant abnormality. Pancreas: No mass, inflammatory changes, or other significant abnormality. Spleen: Within normal limits in size and appearance. Adrenals/Urinary Tract: 8 mm hypodense lesion within the superior pole of the right kidney, too small to characterize but most likely a small benign cyst. Kidneys otherwise unremarkable bilaterally without stone or hydronephrosis. Left adrenal gland is somewhat bulbous in configuration but without discrete mass. Right adrenal gland appears normal. Stomach/Bowel: Bowel is normal in caliber. No evidence of bowel wall thickening or bowel wall inflammation seen. Appendix is normal. Stomach is moderately distended with recently ingested materials but otherwise unremarkable. Vascular/Lymphatic: No pathologically enlarged lymph nodes. No evidence of abdominal aortic aneursym. Atherosclerotic changes are seen along the walls of the normal-caliber abdominal aorta. Reproductive: No mass or other significant abnormality. Other: No free fluid or soft tissue mass is identified within the abdomen or pelvis. No free intraperitoneal air. Musculoskeletal: No suspicious bone lesions identified. Mild degenerative changes seen within the lumbar spine but no acute osseous abnormality. IMPRESSION: 1. Masslike consolidation within the superior segment of the left lower lobe, measuring 3.7 x 3.7 cm, highly suspicious for neoplastic pulmonary mass. 2. No other evidence of neoplastic process seen within the chest, abdomen or pelvis. No  acute findings. 3. 8 mm hypodense lesion within the superior pole of the right kidney, too small to characterize but most likely a small benign cyst. Given the presumed intracranial metastasis and today's masslike consolidation in the left lower lobe, would consider follow-up renal protocol CT or MRI at some point to ensure benignity. Electronically Signed   By: SFranki CabotM.D.   On: 12/13/2014 08:28   Dg Chest Port 1 View  12/16/2014  CLINICAL DATA:  Status post bronchoscopy EXAM: PORTABLE CHEST 1 VIEW COMPARISON:  12/13/2014 FINDINGS: Cardiomediastinal silhouette is stable. Persistent masslike consolidation left suprahilar posteromedially. No acute infiltrate or pulmonary edema. There is no pneumothorax. IMPRESSION: Persistent masslike consolidation left suprahilar posterior medially. No active disease. No pneumothorax. Electronically Signed   By: LLahoma CrockerM.D.   On: 12/16/2014 08:12   Dg C-arm Bronchoscopy  12/16/2014  CLINICAL DATA:  C-ARM BRONCHOSCOPY Fluoroscopy was utilized by the requesting physician.  No radiographic interpretation.  IMPRESSION: 54 y.o. gentleman with a 3.6 cm left frontal brain metastasis from non-small cell lung cancer of the left lower lobe of the lung.   The patient would benefit from surgical resection of the brain metastasis.* In addition, the patient would potentially benefit from radiotherapy.* The options include whole brain irradiation versus stereotactic radiosurgery. There are pros and cons associated with each of these potential treatment options. Whole brain radiotherapy would treat the known metastatic deposits and help provide some reduction of risk for future brain metastases. However, whole brain radiotherapy carries potential risks including hair loss, subacute somnolence, and neurocognitive changes including a possible reduction in short-term memory. Whole brain radiotherapy also may carry a lower likelihood of tumor control at the treatment sites  because of the low-dose used. Stereotactic radiosurgery carries a higher likelihood for local tumor control at the targeted sites with lower associated risk for neurocognitive changes such as memory loss.* However, the use of stereotactic radiosurgery in this setting may leave the patient at increased risk for new brain metastases elsewhere in the brain as high as 50-60%. Accordingly, patients who receive stereotactic radiosurgery in this setting should undergo ongoing surveillance imaging with brain MRI more frequently in order to identify and treat new small brain metastases before they become symptomatic. Stereotactic radiosurgery does carry some different risks, including a risk of radionecrosis.  PLAN: Today, I reviewed the findings and workup thus far with the patient. We discussed the dilemma regarding whole brain radiotherapy versus stereotactic radiosurgery. We discussed the pros and cons of each. We also discussed the logistics and delivery of each. We reviewed the results associated with each of the treatments described above. The patient seems to understand the treatment options and would like to proceed with stereotactic radiosurgery.  In terms of timing of the stereotactic radiosurgery, evidence suggests that risk of radionecrosis and leptomeningeal recurrence is lower when used in the pre-operative setting as opposed to post-operative SRS.*  Tentatively, the patient will be set up for [place] later today, with treatment on 12/25/14, and surgical resection on 12/26/14 by Dr. Cyndy Freeze. He is scheduled to see Dr. Julien Nordmann on 12/29/14.  I spent face to face time with the patient and more than 50% of that time was spent in counseling and/or coordination of care.   ------------------------------------------------  Sheral Apley. Tammi Klippel, M.D.  *References:  1: Patchell RA, Tibbs PA, Helene Shoe, [address] RJ, [place], Guy Sandifer JS, Young B. A randomized trial of  surgery in the treatment of single metastases to the brain. Lookingglass Feb 22;322(8):494-500. PubMed PMID: [id number].   2: Patchell RA, Tibbs PA, Regine WF, Jonita Albee, Mohiuddin M, Arrie Eastern, [place], [place], Young B. Postoperative radiotherapy in the treatment of single metastases to the brain: a randomized trial. JAMA. 1998 Nov 4;280(17):1485-9. PubMed PMID: [id number].   3: Erlene Senters, Wenda Low, Hess KR, Tomie China, Lang FF, Kor[place] DG, [place], Swint JM, Shiu AS, Maor MH, [place]. Neurocognition in patients with brain metastases treated with radiosurgery or radiosurgery plus whole-brain irradiation: a randomised controlled trial. Lancet Oncol. 2009 Nov;10(11):1037-44. doi: 10.1016/S1470-2045(09)70263-3. Epub 2009 Oct 2. PubMed PMID: 76720947.  4: Weyman Rodney, Estill Dooms, Coralee Pesa, Crocker IR, Lorie Phenix, Charlesetta Garibaldi, Press RH, Tanya Nones, [place], Wait SD, Higinio Plan, Shu HG, Nettie [place]. Comparing Preoperative With Postoperative Stereotactic Radiosurgery for Resectable Brain Metastases: A Multi-institutional Analysis. Neurosurgery. 2015 Nov 2. [Epub ahead of print]  PubMed PMID: [id number].  This document serves as a record of services personally performed by Tyler Pita, MD. It was created on his behalf by Darcus Austin, a trained medical scribe. The creation of this record is based on the scribe's personal observations and the provider's statements to them. This document has been checked and approved by the attending provider.

## 2014-12-22 NOTE — Progress Notes (Signed)
  Radiation Oncology         [phone] ________________________________  Name: Timothy Obrien MRN: [id number]  Date: 12/22/2014  DOB: 18-Nov-1960  SIMULATION AND TREATMENT PLANNING NOTE    ICD-9-CM ICD-10-CM   1. Brain metastases ([place]) 198.3 C79.31     DIAGNOSIS: 54 y.o. gentleman with a 3.6 cm left frontal brain metastasis from non-small cell lung cancer of the left lower lobe of the lung  NARRATIVE:  The patient was brought to the [facility].  Identity was confirmed.  All relevant records and images related to the planned course of therapy were reviewed.  The patient freely provided informed written consent to proceed with treatment after reviewing the details related to the planned course of therapy. The consent form was witnessed and verified by the simulation staff. Intravenous access was established for contrast administration. Then, the patient was set-up in a stable reproducible supine position for radiation therapy.  A relocatable thermoplastic stereotactic head frame was fabricated for precise immobilization.  CT images were obtained.  Surface markings were placed.  The CT images were loaded into the planning software and fused with the patient's targeting MRI scan.  Then the target and avoidance structures were contoured.  Treatment planning then occurred.  The radiation prescription was entered and confirmed.  I have requested 3D planning  I have requested a DVH of the following structures: Brain stem, brain, left eye, right eye, lenses, optic chiasm, target volumes, uninvolved brain, and normal tissue.    SPECIAL TREATMENT PROCEDURE:  The planned course of therapy using radiation constitutes a special treatment procedure. Special care is required in the management of this patient for the following reasons. This treatment constitutes a Special Treatment Procedure for the following reason: High dose per fraction requiring special monitoring for increased  toxicities of treatment including daily imaging.  The special nature of the planned course of radiotherapy will require increased physician supervision and oversight to ensure patient's safety with optimal treatment outcomes.  PLAN:  The patient will receive 16 Gy in 1 fraction to be followed by resection.  ________________________________  Sheral Apley Tammi Klippel, M.D.  This document serves as a record of services personally performed by Tyler Pita, MD. It was created on his behalf by Darcus Austin, a trained medical scribe. The creation of this record is based on the scribe's personal observations and the provider's statements to them. This document has been checked and approved by the attending provider.

## 2014-12-22 NOTE — Progress Notes (Signed)
See progress note under physician encounter. 

## 2014-12-22 NOTE — Progress Notes (Signed)
Location/Histology of Brain Tumor: Non small cell lung ca with large left frontal lobe metastasis   Patient presented on 12/12/14 with approximately 10 days history of intermittent right sided involuntary movements x 2 and 5 days history of right lower extremity weakness.  Past or anticipated interventions, if any, per neurosurgery: pre op SRS  Past or anticipated interventions, if any, per medical oncology: scheduled for new consult visit with Dr. Julien Nordmann on 12/29/14  Dose of Decadron, if applicable: 4 mg four times daily  Recent neurologic symptoms, if any:   Seizures: no  Headaches: no  Nausea: no  Dizziness/ataxia: no  Difficulty with hand coordination: no  Focal numbness/weakness: reports right lower extremity weakness has greatly improved with decadron but, he still has occasions where he drags his right leg  Visual deficits/changes: no  Confusion/Memory deficits: no  Painful bone metastases at present, if any: no  SAFETY ISSUES:  Prior radiation? no  Pacemaker/ICD? no  Possible current pregnancy? no  Is the patient on methotrexate? 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

## 2014-12-23 DIAGNOSIS — Z510 Encounter for antineoplastic radiation therapy: Secondary | ICD-10-CM | POA: Diagnosis not present

## 2014-12-23 MED ORDER — SODIUM CHLORIDE 0.9 % IJ SOLN
10.0000 mL | Freq: Once | INTRAMUSCULAR | Status: AC
  Administered 2014-12-23: 10 mL via INTRAVENOUS

## 2014-12-23 NOTE — Progress Notes (Signed)
12/13/2014 BUN 13 and creatinine 0.92; WDL. Denies taking metformin. Denies a hx of contrast allergy. Started right AC 22 gauge IV on the first attempt. Excellent blood return obtained and site flushed without difficulty. Secured IV in place. Patient escorted to CT for simulation by Mont Dutton, RT. Following simulation Manuela Schwartz discontinued IV and applied a bandaid to the old IV site. She reports the needle was intact upon removal and the patient tolerated this well.

## 2014-12-24 ENCOUNTER — Encounter: Admission: RE | Admit: 2014-12-24 | Payer: Managed Care, Other (non HMO) | Source: Ambulatory Visit

## 2014-12-24 ENCOUNTER — Ambulatory Visit
Admission: RE | Admit: 2014-12-24 | Discharge: 2014-12-24 | Disposition: A | Discharge status: Home / Self Care | Payer: Managed Care, Other (non HMO) | Source: Ambulatory Visit | Attending: Radiation Oncology | Admitting: Radiation Oncology

## 2014-12-24 ENCOUNTER — Telehealth: Payer: Self-pay | Admitting: Radiation Oncology

## 2014-12-24 ENCOUNTER — Encounter (HOSPITAL_COMMUNITY): Payer: Self-pay

## 2014-12-24 ENCOUNTER — Encounter (HOSPITAL_COMMUNITY)
Admission: RE | Admit: 2014-12-24 | Discharge: 2014-12-24 | Disposition: A | Discharge status: Home / Self Care | Payer: Managed Care, Other (non HMO) | Source: Ambulatory Visit | Attending: Neurological Surgery | Admitting: Neurological Surgery

## 2014-12-24 DIAGNOSIS — C7931 Secondary malignant neoplasm of brain: Secondary | ICD-10-CM

## 2014-12-24 DIAGNOSIS — C3432 Malignant neoplasm of lower lobe, left bronchus or lung: Secondary | ICD-10-CM | POA: Diagnosis present

## 2014-12-24 DIAGNOSIS — R590 Localized enlarged lymph nodes: Secondary | ICD-10-CM | POA: Diagnosis not present

## 2014-12-24 HISTORY — DX: Pneumonia, unspecified organism: J18.9

## 2014-12-24 HISTORY — DX: Tobacco use: Z72.0

## 2014-12-24 HISTORY — DX: Unspecified convulsions: R56.9

## 2014-12-24 HISTORY — DX: Bronchitis, not specified as acute or chronic: J40

## 2014-12-24 LAB — BASIC METABOLIC PANEL
ANION GAP: 9 (ref 5–15)
BUN: 21 mg/dL — ABNORMAL HIGH (ref 6–20)
CALCIUM: 8.9 mg/dL (ref 8.9–10.3)
CO2: 26 mmol/L (ref 22–32)
CREATININE: 1.03 mg/dL (ref 0.61–1.24)
Chloride: 100 mmol/L — ABNORMAL LOW (ref 101–111)
Glucose, Bld: 145 mg/dL — ABNORMAL HIGH (ref 65–99)
Potassium: 4.1 mmol/L (ref 3.5–5.1)
SODIUM: 135 mmol/L (ref 135–145)

## 2014-12-24 LAB — CBC
HCT: 46.3 % (ref 39.0–52.0)
HEMOGLOBIN: 16.2 g/dL (ref 13.0–17.0)
MCH: 34.3 pg — AB (ref 26.0–34.0)
MCHC: 35 g/dL (ref 30.0–36.0)
MCV: 98.1 fL (ref 78.0–100.0)
PLATELETS: 275 10*3/uL (ref 150–400)
RBC: 4.72 MIL/uL (ref 4.22–5.81)
RDW: 13.6 % (ref 11.5–15.5)
WBC: 13.2 10*3/uL — AB (ref 4.0–10.5)

## 2014-12-24 LAB — GLUCOSE, CAPILLARY: Glucose-Capillary: 106 mg/dL — ABNORMAL HIGH (ref 65–99)

## 2014-12-24 MED ORDER — FLUDEOXYGLUCOSE F - 18 (FDG) INJECTION
11.6200 | Freq: Once | INTRAVENOUS | Status: AC | PRN
  Administered 2014-12-24: 11.62 via INTRAVENOUS

## 2014-12-24 NOTE — Telephone Encounter (Signed)
Called patient and left message and gave new d/t for appt 12/28 @ 2:15 w/Dr. Julien Nordmann ask patient to return call to confirm message was received.

## 2014-12-24 NOTE — Progress Notes (Signed)
  Radiation Oncology         [phone] ________________________________  Stereotactic Treatment Procedure Note  Name: Timothy Obrien MRN: [id number]  Date: 12/25/2014  DOB: [birth year]  SPECIAL TREATMENT PROCEDURE    ICD-9-CM ICD-10-CM   1. Metastasis to brain ([place]) 198.3 C79.31     3D TREATMENT PLANNING AND DOSIMETRY:  The patient's radiation plan was reviewed and approved by neurosurgery and radiation oncology prior to treatment.  It showed 3-dimensional radiation distributions overlaid onto the planning CT/MRI image set.  The [facility] for the target structures as well as the organs at risk were reviewed. The documentation of the 3D plan and dosimetry are filed in the radiation oncology EMR.  NARRATIVE:  Timothy Obrien was brought to the TrueBeam stereotactic radiation treatment machine and placed supine on the CT couch. The head frame was applied, and the patient was set up for stereotactic radiosurgery.  Neurosurgery was present for the set-up and delivery  SIMULATION VERIFICATION:  In the couch zero-angle position, the patient underwent Exactrac imaging using the Brainlab system with orthogonal KV images.  These were carefully aligned and repeated to confirm treatment position for each of the isocenters.  The Exactrac snap film verification was repeated at each couch angle.  PROCEDURE: Timothy Obrien received stereotactic radiosurgery to the following targets: Left frontal 36 mm target was treated using 4 Dynamic Conformal Arcs to a prescription dose of 16 Gy.  ExacTrac registration was performed for each couch angle.    STEREOTACTIC TREATMENT MANAGEMENT:  Following delivery, the patient was transported to nursing in stable condition and monitored for possible acute effects.  Vital signs were recorded BP 108/75 mmHg  Pulse 64  Temp(Src) 97.6 F (36.4 C). The patient tolerated treatment without significant acute effects, and was discharged to home in stable condition.    PLAN:  Resection with Dr. Cyndy Freeze tomorrow and then follow-up in one month.  Patient may be candidate for thoracic pre-op radiotherapy to chest to 45 Gy with chemo after recovery from surgery.  ________________________________  Sheral Apley Tammi Klippel, M.D.      This document serves as a record of services personally performed by Tyler Pita, MD. It was created on his behalf by Arlyce Harman, a trained medical scribe. The creation of this record is based on the scribe's personal observations and the provider's statements to them. This document has been checked and approved by the attending provider.

## 2014-12-24 NOTE — Telephone Encounter (Signed)
Received call from [facility]. She explains the patient PET was denied by insurance thus, cancelled. She goes onto say though that when she contacted the patient to cancel he wished to pay out of pocket thus, the scan was rescheduled for 1230 today. Informed Mont Dutton, RT of these findings.

## 2014-12-24 NOTE — Pre-Procedure Instructions (Signed)
Timothy Obrien  12/24/2014      [facility]- [place], [place] - [address] [name] [id number]: [phone]: [phone]   Your procedure is scheduled on Friday, December 26, 2014  [facility]  Call this number if you have problems the morning of surgery:  [phone]   Remember:  Do not eat food or drink liquids after midnight Thursday, December 25, 2014  Take these medicines the morning of surgery with A SIP OF WATER : dexamethasone (DECADRON), levETIRAcetam (KEPPRA), pantoprazole (PROTONIX), if needed: acetaminophen (TYLENOL) for pain, clonazePAM (KLONOPIN) for anxiety Stop taking Aspirin, vitamins, fish oil, and herbal medications. Do not take any NSAIDs ie: Ibuprofen, Advil, Naproxen or any medication containing Aspirin; stop now.   Do not wear jewelry, make-up or nail polish.  Do not wear lotions, powders, or perfumes.  You may not wear deodorant.  Do not shave 48 hours prior to surgery.  Men may shave face and neck.  Do not bring valuables to the [facility].  [facility] or [facility].  Contacts, dentures or bridgework may not be worn into surgery.  Leave your suitcase in the car.  After surgery it may be brought to your room.  For patients admitted to the [facility], discharge time will be determined by your treatment team.  Patients discharged the day of surgery will not be allowed to drive home.   Name and phone number of your driver:    Special instructions:  Special Instructions:Special Instructions: [facility]- Preparing for Surgery  Before surgery, you can play an important role.  Because skin is not sterile, your skin needs to be as free of germs as possible.  You can reduce the number of germs on you skin by washing with CHG (chlorahexidine gluconate) soap before surgery.  CHG is an antiseptic cleaner which kills  germs and bonds with the skin to continue killing germs even after washing.  Please DO NOT use if you have an allergy to CHG or antibacterial soaps.  If your skin becomes reddened/irritated stop using the CHG and inform your nurse when you arrive at [facility].  Do not shave (including legs and underarms) for at least 48 hours prior to the first CHG shower.  You may shave your face.  Please follow these instructions carefully:   1.  Shower with CHG Soap the night before surgery and the morning of Surgery.  2.  If you choose to wash your hair, wash your hair first as usual with your normal shampoo.  3.  After you shampoo, rinse your hair and body thoroughly to remove the Shampoo.  4.  Use CHG as you would any other liquid soap.  You can apply chg directly  to the skin and wash gently with scrungie or a clean washcloth.  5.  Apply the CHG Soap to your body ONLY FROM THE NECK DOWN.  Do not use on open wounds or open sores.  Avoid contact with your eyes, ears, mouth and genitals (private parts).  Wash genitals (private parts) with your normal soap.  6.  Wash thoroughly, paying special attention to the area where your surgery will be performed.  7.  Thoroughly rinse your body with warm water from the neck down.  8.  DO NOT shower/wash with your normal soap after using and rinsing off the CHG  Soap.  9.  Pat yourself dry with a clean towel.            10.  Wear clean pajamas.            11.  Place clean sheets on your bed the night of your first shower and do not sleep with pets.  Day of Surgery  Do not apply any lotions/deodorants the morning of surgery.  Please wear clean clothes to the [facility]/surgery center.  Please read over the following fact sheets that you were given. Pain Booklet, Coughing and Deep Breathing and Surgical Site Infection Prevention

## 2014-12-25 ENCOUNTER — Ambulatory Visit
Admission: RE | Admit: 2014-12-25 | Discharge: 2014-12-25 | Disposition: A | Discharge status: Home / Self Care | Payer: Managed Care, Other (non HMO) | Source: Ambulatory Visit | Attending: Radiation Oncology | Admitting: Radiation Oncology

## 2014-12-25 ENCOUNTER — Encounter: Payer: Self-pay | Admitting: Radiation Oncology

## 2014-12-25 VITALS — BP 108/75 | HR 64 | Temp 97.6°F

## 2014-12-25 DIAGNOSIS — C7931 Secondary malignant neoplasm of brain: Secondary | ICD-10-CM | POA: Diagnosis present

## 2014-12-25 DIAGNOSIS — Z510 Encounter for antineoplastic radiation therapy: Secondary | ICD-10-CM | POA: Diagnosis not present

## 2014-12-25 DIAGNOSIS — Z803 Family history of malignant neoplasm of breast: Secondary | ICD-10-CM | POA: Diagnosis not present

## 2014-12-25 DIAGNOSIS — Z85118 Personal history of other malignant neoplasm of bronchus and lung: Secondary | ICD-10-CM | POA: Diagnosis not present

## 2014-12-25 DIAGNOSIS — Z8371 Family history of colonic polyps: Secondary | ICD-10-CM | POA: Diagnosis not present

## 2014-12-25 DIAGNOSIS — Z8261 Family history of arthritis: Secondary | ICD-10-CM | POA: Diagnosis not present

## 2014-12-25 DIAGNOSIS — F17210 Nicotine dependence, cigarettes, uncomplicated: Secondary | ICD-10-CM | POA: Diagnosis not present

## 2014-12-25 NOTE — Progress Notes (Signed)
Mr. Timothy Obrien s/p SRS to brain at ~ 4:30pm.  He denies any headache, blurred vision, nor N&V.  VSS

## 2014-12-25 NOTE — Progress Notes (Signed)
Mr. Neidert released to home.  He continues to debn=ny any neurological changes, but note a slight change in balance when ambulating the first 1-3 steps when leaving radiation oncology.  Accompanied by his spouse.  To have surgery on tomorrow.

## 2014-12-25 NOTE — Op Note (Signed)
  Name: CHARLY HOLCOMB  MRN: [id number]  Date: 12/25/2014   DOB: 1960/07/25  Stereotactic Radiosurgery Operative Note  PRE-OPERATIVE DIAGNOSIS:  Solitary Brain Metastasis, left frontal  POST-OPERATIVE DIAGNOSIS:  Solitary Brain Metastasis, left frontal  PROCEDURE:  Stereotactic Radiosurgery  SURGEON:  Kevan Ny Ditty, MD  NARRATIVE: The patient underwent a radiation treatment planning session in the radiation oncology simulation suite under the care of the radiation oncology physician and physicist.  I participated closely in the radiation treatment planning afterwards. The patient underwent planning CT which was fused to 3T high resolution MRI with 1 mm axial slices.  These images were fused on the planning system.  We contoured the gross target volumes and subsequently expanded this to yield the Planning Target Volume. I actively participated in the planning process.  I helped to define and review the target contours and also the contours of the optic pathway, eyes, brainstem and selected nearby organs at risk.  All the dose constraints for critical structures were reviewed and compared to AAPM Task Group 101.  The prescription dose conformity was reviewed.  I approved the plan electronically.    Accordingly, [name] was brought to the TrueBeam stereotactic radiation treatment linac and placed in the custom immobilization mask.  The patient was aligned according to the IR fiducial markers with BrainLab Exactrac, then orthogonal x-rays were used in ExacTrac with the 6DOF robotic table and the shifts were made to align the patient  [name] received stereotactic radiosurgery uneventfully.    The detailed description of the procedure is recorded in the radiation oncology procedure note.  I was present for the duration of the procedure.  DISPOSITION:  Following delivery, the patient was transported to nursing in stable condition and monitored for possible acute effects to be  discharged to home in stable condition.  He will [facility] to [facility] for a stereotactic left frontal craniotomy for tumor resection.  Kevan Ny Ditty, MD 12/25/2014 4:22 PM

## 2014-12-26 ENCOUNTER — Inpatient Hospital Stay (HOSPITAL_COMMUNITY): Payer: Managed Care, Other (non HMO)

## 2014-12-26 ENCOUNTER — Inpatient Hospital Stay (HOSPITAL_COMMUNITY)
Admission: RE | Admit: 2014-12-26 | Discharge: 2014-12-27 | DRG: 026 | Disposition: A | Discharge status: Home / Self Care | Payer: Managed Care, Other (non HMO) | Source: Ambulatory Visit | Attending: Neurological Surgery | Admitting: Neurological Surgery

## 2014-12-26 ENCOUNTER — Inpatient Hospital Stay (HOSPITAL_COMMUNITY): Payer: Managed Care, Other (non HMO) | Admitting: Certified Registered Nurse Anesthetist

## 2014-12-26 ENCOUNTER — Telehealth: Payer: Self-pay | Admitting: *Deleted

## 2014-12-26 ENCOUNTER — Encounter (HOSPITAL_COMMUNITY)
Admission: RE | Disposition: A | Discharge status: Home / Self Care | Payer: Self-pay | Source: Ambulatory Visit | Attending: Neurological Surgery

## 2014-12-26 ENCOUNTER — Encounter (HOSPITAL_COMMUNITY): Payer: Self-pay

## 2014-12-26 DIAGNOSIS — Z9889 Other specified postprocedural states: Secondary | ICD-10-CM

## 2014-12-26 DIAGNOSIS — M21379 Foot drop, unspecified foot: Secondary | ICD-10-CM | POA: Diagnosis not present

## 2014-12-26 DIAGNOSIS — F17210 Nicotine dependence, cigarettes, uncomplicated: Secondary | ICD-10-CM | POA: Diagnosis present

## 2014-12-26 DIAGNOSIS — C3490 Malignant neoplasm of unspecified part of unspecified bronchus or lung: Secondary | ICD-10-CM | POA: Diagnosis present

## 2014-12-26 DIAGNOSIS — C7931 Secondary malignant neoplasm of brain: Secondary | ICD-10-CM | POA: Diagnosis present

## 2014-12-26 DIAGNOSIS — C3432 Malignant neoplasm of lower lobe, left bronchus or lung: Secondary | ICD-10-CM

## 2014-12-26 DIAGNOSIS — R569 Unspecified convulsions: Secondary | ICD-10-CM | POA: Diagnosis present

## 2014-12-26 HISTORY — PX: CRANIOTOMY: SHX93

## 2014-12-26 HISTORY — PX: APPLICATION OF CRANIAL NAVIGATION: SHX6578

## 2014-12-26 SURGERY — CRANIOTOMY TUMOR EXCISION
Anesthesia: General | Site: Head

## 2014-12-26 MED ORDER — FENTANYL CITRATE (PF) 250 MCG/5ML IJ SOLN
INTRAMUSCULAR | Status: AC
  Filled 2014-12-26: qty 5

## 2014-12-26 MED ORDER — SODIUM CHLORIDE 0.9 % IR SOLN
Status: DC | PRN
  Administered 2014-12-26: 500 mL

## 2014-12-26 MED ORDER — ACETAMINOPHEN 325 MG PO TABS: 650.0000 mg | ORAL_TABLET | ORAL | Status: DC | PRN

## 2014-12-26 MED ORDER — PROPOFOL 10 MG/ML IV BOLUS
INTRAVENOUS | Status: AC
  Filled 2014-12-26: qty 20

## 2014-12-26 MED ORDER — HYDROCODONE-ACETAMINOPHEN 7.5-325 MG PO TABS: 1.0000 | ORAL_TABLET | Freq: Four times a day (QID) | ORAL | Status: DC | PRN

## 2014-12-26 MED ORDER — SODIUM CHLORIDE 0.9 % IJ SOLN
INTRAMUSCULAR | Status: AC
  Filled 2014-12-26: qty 20

## 2014-12-26 MED ORDER — GLYCOPYRROLATE 0.2 MG/ML IJ SOLN
INTRAMUSCULAR | Status: DC | PRN
  Administered 2014-12-26 (×2): 0.2 mg via INTRAVENOUS

## 2014-12-26 MED ORDER — FENTANYL CITRATE (PF) 100 MCG/2ML IJ SOLN
INTRAMUSCULAR | Status: DC | PRN
  Administered 2014-12-26: 100 ug via INTRAVENOUS

## 2014-12-26 MED ORDER — SUGAMMADEX SODIUM 200 MG/2ML IV SOLN
INTRAVENOUS | Status: DC | PRN
  Administered 2014-12-26: 200 mg via INTRAVENOUS

## 2014-12-26 MED ORDER — REMIFENTANIL HCL 1 MG IV SOLR
0.1000 ug/kg/min | INTRAVENOUS | Status: DC
  Administered 2014-12-26: .2 ug/kg/min via INTRAVENOUS
  Filled 2014-12-26: qty 5000

## 2014-12-26 MED ORDER — SODIUM CHLORIDE 0.9 % IV SOLN
INTRAVENOUS | Status: DC | PRN
  Administered 2014-12-26: 14:00:00 via INTRAVENOUS

## 2014-12-26 MED ORDER — BUPIVACAINE-EPINEPHRINE (PF) 0.5% -1:200000 IJ SOLN
INTRAMUSCULAR | Status: DC | PRN
  Administered 2014-12-26: 10 mL via PERINEURAL

## 2014-12-26 MED ORDER — ONDANSETRON HCL 4 MG PO TABS: 4.0000 mg | ORAL_TABLET | ORAL | Status: DC | PRN

## 2014-12-26 MED ORDER — FUROSEMIDE 10 MG/ML IJ SOLN
INTRAMUSCULAR | Status: DC | PRN
  Administered 2014-12-26: 10 mg via INTRAMUSCULAR

## 2014-12-26 MED ORDER — THROMBIN 5000 UNITS EX SOLR
OROMUCOSAL | Status: DC | PRN
  Administered 2014-12-26: 10 mL via TOPICAL

## 2014-12-26 MED ORDER — LIDOCAINE-EPINEPHRINE 2 %-1:100000 IJ SOLN
INTRAMUSCULAR | Status: DC | PRN
  Administered 2014-12-26: 10 mL via INTRADERMAL

## 2014-12-26 MED ORDER — PROMETHAZINE HCL 25 MG/ML IJ SOLN: 6.2500 mg | INTRAMUSCULAR | Status: DC | PRN

## 2014-12-26 MED ORDER — MANNITOL 20 % IV SOLN: INTRAVENOUS | Status: DC | PRN

## 2014-12-26 MED ORDER — MICROFIBRILLAR COLL HEMOSTAT EX PADS
MEDICATED_PAD | CUTANEOUS | Status: DC | PRN
Start: 2014-12-26 — End: 2014-12-26
  Administered 2014-12-26: 1 via TOPICAL

## 2014-12-26 MED ORDER — HYDROMORPHONE HCL 1 MG/ML IJ SOLN
0.5000 mg | INTRAMUSCULAR | Status: DC | PRN
  Administered 2014-12-26 – 2014-12-27 (×2): 1 mg via INTRAVENOUS
  Filled 2014-12-26 (×2): qty 1

## 2014-12-26 MED ORDER — ONDANSETRON HCL 4 MG/2ML IJ SOLN
INTRAMUSCULAR | Status: DC | PRN
  Administered 2014-12-26: 4 mg via INTRAVENOUS

## 2014-12-26 MED ORDER — ARTIFICIAL TEARS OP OINT
TOPICAL_OINTMENT | OPHTHALMIC | Status: DC | PRN
  Administered 2014-12-26: 1 via OPHTHALMIC

## 2014-12-26 MED ORDER — PANTOPRAZOLE SODIUM 20 MG PO TBEC
20.0000 mg | DELAYED_RELEASE_TABLET | Freq: Every day | ORAL | Status: DC
  Administered 2014-12-26 – 2014-12-27 (×2): 20 mg via ORAL
  Filled 2014-12-26 (×2): qty 1

## 2014-12-26 MED ORDER — ACETAMINOPHEN 650 MG RE SUPP: 650.0000 mg | RECTAL | Status: DC | PRN

## 2014-12-26 MED ORDER — LEVETIRACETAM 750 MG PO TABS
750.0000 mg | ORAL_TABLET | Freq: Two times a day (BID) | ORAL | Status: DC
  Administered 2014-12-26 – 2014-12-27 (×2): 750 mg via ORAL
  Filled 2014-12-26 (×2): qty 1

## 2014-12-26 MED ORDER — ROCURONIUM BROMIDE 50 MG/5ML IV SOLN
INTRAVENOUS | Status: AC
  Filled 2014-12-26: qty 1

## 2014-12-26 MED ORDER — CEFAZOLIN SODIUM 1-5 GM-% IV SOLN
1.0000 g | Freq: Three times a day (TID) | INTRAVENOUS | Status: AC
  Administered 2014-12-26 – 2014-12-27 (×2): 1 g via INTRAVENOUS
  Filled 2014-12-26 (×2): qty 50

## 2014-12-26 MED ORDER — LEVETIRACETAM 750 MG PO TABS
750.0000 mg | ORAL_TABLET | ORAL | Status: DC
  Filled 2014-12-26: qty 1

## 2014-12-26 MED ORDER — MANNITOL 25 % IV SOLN
INTRAVENOUS | Status: DC | PRN
  Administered 2014-12-26: 25 g via INTRAVENOUS

## 2014-12-26 MED ORDER — NICOTINE 14 MG/24HR TD PT24
14.0000 mg | MEDICATED_PATCH | Freq: Every day | TRANSDERMAL | Status: DC
  Administered 2014-12-26: 14 mg via TRANSDERMAL
  Filled 2014-12-26 (×2): qty 1

## 2014-12-26 MED ORDER — SUGAMMADEX SODIUM 200 MG/2ML IV SOLN
INTRAVENOUS | Status: AC
  Filled 2014-12-26: qty 2

## 2014-12-26 MED ORDER — CLONAZEPAM 0.5 MG PO TABS: 0.5000 mg | ORAL_TABLET | Freq: Three times a day (TID) | ORAL | Status: DC | PRN

## 2014-12-26 MED ORDER — PROPOFOL 10 MG/ML IV BOLUS
INTRAVENOUS | Status: DC | PRN
  Administered 2014-12-26: 150 mg via INTRAVENOUS

## 2014-12-26 MED ORDER — DEXAMETHASONE 4 MG PO TABS
4.0000 mg | ORAL_TABLET | Freq: Four times a day (QID) | ORAL | Status: DC
  Administered 2014-12-26 – 2014-12-27 (×3): 4 mg via ORAL
  Filled 2014-12-26 (×3): qty 1

## 2014-12-26 MED ORDER — SODIUM CHLORIDE 0.9 % IV SOLN
INTRAVENOUS | Status: DC
  Administered 2014-12-26 – 2014-12-27 (×2): via INTRAVENOUS

## 2014-12-26 MED ORDER — PROMETHAZINE HCL 25 MG PO TABS: 12.5000 mg | ORAL_TABLET | ORAL | Status: DC | PRN

## 2014-12-26 MED ORDER — LABETALOL HCL 5 MG/ML IV SOLN: 10.0000 mg | INTRAVENOUS | Status: DC | PRN

## 2014-12-26 MED ORDER — 0.9 % SODIUM CHLORIDE (POUR BTL) OPTIME
TOPICAL | Status: DC | PRN
  Administered 2014-12-26 (×4): 1000 mL

## 2014-12-26 MED ORDER — ROCURONIUM BROMIDE 100 MG/10ML IV SOLN
INTRAVENOUS | Status: DC | PRN
  Administered 2014-12-26: 30 mg via INTRAVENOUS
  Administered 2014-12-26: 10 mg via INTRAVENOUS
  Administered 2014-12-26: 20 mg via INTRAVENOUS
  Administered 2014-12-26 (×2): 10 mg via INTRAVENOUS
  Administered 2014-12-26: 50 mg via INTRAVENOUS
  Administered 2014-12-26: 10 mg via INTRAVENOUS

## 2014-12-26 MED ORDER — SODIUM CHLORIDE 0.9 % IV SOLN
10.0000 mg | INTRAVENOUS | Status: DC | PRN
  Administered 2014-12-26: 20 ug/min via INTRAVENOUS

## 2014-12-26 MED ORDER — GLYCOPYRROLATE 0.2 MG/ML IJ SOLN
INTRAMUSCULAR | Status: AC
  Filled 2014-12-26: qty 2

## 2014-12-26 MED ORDER — HYDROCODONE-ACETAMINOPHEN 5-325 MG PO TABS
1.0000 | ORAL_TABLET | ORAL | Status: DC | PRN
  Administered 2014-12-26 – 2014-12-27 (×2): 1 via ORAL
  Filled 2014-12-26 (×3): qty 1

## 2014-12-26 MED ORDER — LACTATED RINGERS IV SOLN
INTRAVENOUS | Status: DC
  Administered 2014-12-26: 10:00:00 via INTRAVENOUS

## 2014-12-26 MED ORDER — EPHEDRINE SULFATE 50 MG/ML IJ SOLN
INTRAMUSCULAR | Status: AC
  Filled 2014-12-26: qty 1

## 2014-12-26 MED ORDER — FUROSEMIDE 10 MG/ML IJ SOLN
INTRAMUSCULAR | Status: AC
  Filled 2014-12-26: qty 4

## 2014-12-26 MED ORDER — CEFAZOLIN SODIUM-DEXTROSE 2-3 GM-% IV SOLR
2.0000 g | INTRAVENOUS | Status: AC
  Administered 2014-12-26: 2 g via INTRAVENOUS
  Filled 2014-12-26: qty 50

## 2014-12-26 MED ORDER — LIDOCAINE HCL (CARDIAC) 20 MG/ML IV SOLN
INTRAVENOUS | Status: DC | PRN
  Administered 2014-12-26: 100 mg via INTRAVENOUS

## 2014-12-26 MED ORDER — THROMBIN 20000 UNITS EX SOLR
CUTANEOUS | Status: DC | PRN
  Administered 2014-12-26: 20 mL via TOPICAL

## 2014-12-26 MED ORDER — ACETAMINOPHEN 325 MG PO TABS: 650.0000 mg | ORAL_TABLET | Freq: Four times a day (QID) | ORAL | Status: DC | PRN

## 2014-12-26 MED ORDER — HYDROMORPHONE HCL 1 MG/ML IJ SOLN: 0.2500 mg | INTRAMUSCULAR | Status: DC | PRN

## 2014-12-26 MED ORDER — MIDAZOLAM HCL 2 MG/2ML IJ SOLN
INTRAMUSCULAR | Status: AC
  Filled 2014-12-26: qty 2

## 2014-12-26 MED ORDER — LIDOCAINE-EPINEPHRINE 2 %-1:100000 IJ SOLN
20.0000 mL | INTRAMUSCULAR | Status: DC
  Filled 2014-12-26 (×2): qty 20

## 2014-12-26 MED ORDER — DEXAMETHASONE 4 MG PO TABS: 4.0000 mg | ORAL_TABLET | Freq: Four times a day (QID) | ORAL | Status: DC

## 2014-12-26 MED ORDER — ONDANSETRON HCL 4 MG/2ML IJ SOLN: 4.0000 mg | INTRAMUSCULAR | Status: DC | PRN

## 2014-12-26 SURGICAL SUPPLY — 85 items
APL SKNCLS STERI-STRIP NONHPOA (GAUZE/BANDAGES/DRESSINGS)
APL SRG 60D 8 XTD TIP BNDBL (TIP) ×2
APPLICATOR CHLORAPREP 3ML ORNG (MISCELLANEOUS) ×4 IMPLANT
BATTERY IQ STERILE (MISCELLANEOUS) ×2 IMPLANT
BENZOIN TINCTURE PRP APPL 2/3 (GAUZE/BANDAGES/DRESSINGS) IMPLANT
BLADE CLIPPER SURG (BLADE) ×4 IMPLANT
BLADE ULTRA TIP 2M (BLADE) ×4 IMPLANT
BNDG GAUZE ELAST 4 BULKY (GAUZE/BANDAGES/DRESSINGS) IMPLANT
BRUSH SCRUB EZ 1% IODOPHOR (MISCELLANEOUS) ×4 IMPLANT
BTRY SRG DRVR 1.5 IQ (MISCELLANEOUS) ×2
BUR ACORN 6.0 PRECISION (BURR) ×3 IMPLANT
BUR ACORN 6.0MM PRECISION (BURR) ×1
BUR ADDG 1.1 (BURR) IMPLANT
BUR ADDG 1.1MM (BURR)
BUR MATCHSTICK NEURO 3.0 LAGG (BURR) IMPLANT
BUR SPIRAL ROUTER 2.3 (BUR) ×1 IMPLANT
BUR SPIRAL ROUTER 2.3MM (BUR) ×1
CANISTER SUCT 3000ML PPV (MISCELLANEOUS) ×4 IMPLANT
CATH ROBINSON RED A/P 14FR (CATHETERS) IMPLANT
CLIP TI MEDIUM 6 (CLIP) IMPLANT
DRAIN SNY WOU 7FLT (WOUND CARE) IMPLANT
DRAPE NEUROLOGICAL W/INCISE (DRAPES) ×4 IMPLANT
DRAPE SHEET LG 3/4 BI-LAMINATE (DRAPES) ×8 IMPLANT
DRAPE SURG 17X23 STRL (DRAPES) IMPLANT
DRAPE WARM FLUID 44X44 (DRAPE) ×4 IMPLANT
DRSG MEPILEX BORDER 4X8 (GAUZE/BANDAGES/DRESSINGS) ×2 IMPLANT
DURASEAL APPLICATOR TIP (TIP) ×2 IMPLANT
DURASEAL SPINE SEALANT 3ML (MISCELLANEOUS) ×2 IMPLANT
ELECT REM PT RETURN 9FT ADLT (ELECTROSURGICAL) ×4
ELECTRODE REM PT RTRN 9FT ADLT (ELECTROSURGICAL) ×2 IMPLANT
EVACUATOR 1/8 PVC DRAIN (DRAIN) IMPLANT
EVACUATOR SILICONE 100CC (DRAIN) IMPLANT
FORCEPS BIPOLAR SPETZLER 8 1.0 (NEUROSURGERY SUPPLIES) ×2 IMPLANT
GAUZE SPONGE 4X4 12PLY STRL (GAUZE/BANDAGES/DRESSINGS) ×4 IMPLANT
GAUZE SPONGE 4X4 16PLY XRAY LF (GAUZE/BANDAGES/DRESSINGS) IMPLANT
GLOVE BIOGEL PI IND STRL 7.5 (GLOVE) ×2 IMPLANT
GLOVE BIOGEL PI INDICATOR 7.5 (GLOVE) ×2
GLOVE EXAM NITRILE LRG STRL (GLOVE) ×4 IMPLANT
GLOVE EXAM NITRILE MD LF STRL (GLOVE) IMPLANT
GLOVE EXAM NITRILE XL STR (GLOVE) IMPLANT
GLOVE EXAM NITRILE XS STR PU (GLOVE) IMPLANT
GLOVE SS BIOGEL STRL SZ 7 (GLOVE) ×4 IMPLANT
GLOVE SUPERSENSE BIOGEL SZ 7 (GLOVE) ×4
GOWN STRL REUS W/ TWL LRG LVL3 (GOWN DISPOSABLE) ×2 IMPLANT
GOWN STRL REUS W/ TWL XL LVL3 (GOWN DISPOSABLE) IMPLANT
GOWN STRL REUS W/TWL 2XL LVL3 (GOWN DISPOSABLE) IMPLANT
GOWN STRL REUS W/TWL LRG LVL3 (GOWN DISPOSABLE) ×4
GOWN STRL REUS W/TWL XL LVL3 (GOWN DISPOSABLE)
HEMOSTAT POWDER KIT SURGIFOAM (HEMOSTASIS) ×4 IMPLANT
HEMOSTAT SURGICEL 2X14 (HEMOSTASIS) IMPLANT
KIT BASIN OR (CUSTOM PROCEDURE TRAY) ×4 IMPLANT
KIT ROOM TURNOVER OR (KITS) ×4 IMPLANT
MARKER SPHERE PSV REFLC 13MM (MARKER) ×20 IMPLANT
NDL HYPO 25X1 1.5 SAFETY (NEEDLE) ×2 IMPLANT
NEEDLE HYPO 25X1 1.5 SAFETY (NEEDLE) ×4 IMPLANT
NS IRRIG 1000ML POUR BTL (IV SOLUTION) ×8 IMPLANT
PACK CRANIOTOMY (CUSTOM PROCEDURE TRAY) ×4 IMPLANT
PATTIES SURGICAL .5 X.5 (GAUZE/BANDAGES/DRESSINGS) IMPLANT
PATTIES SURGICAL .5 X3 (DISPOSABLE) IMPLANT
PATTIES SURGICAL 1X1 (DISPOSABLE) IMPLANT
PIN MAYFIELD SKULL DISP (PIN) ×4 IMPLANT
PLATE 1.5/0.5 22MM BURR HO (Plate) ×2 IMPLANT
PLATE SHUNT (Plate) ×4 IMPLANT
SCREW SELF DRILL HT 1.5/4MM (Screw) ×24 IMPLANT
SPONGE NEURO XRAY DETECT 1X3 (DISPOSABLE) IMPLANT
SPONGE SURGIFOAM ABS GEL 100 (HEMOSTASIS) ×4 IMPLANT
SPONGE SURGIFOAM ABS GEL 100C (HEMOSTASIS) ×4 IMPLANT
STAPLER VISISTAT 35W (STAPLE) ×6 IMPLANT
STOCKINETTE 6  STRL (DRAPES) ×2
STOCKINETTE 6 STRL (DRAPES) ×2 IMPLANT
SUT ETHILON 3 0 FSL (SUTURE) IMPLANT
SUT ETHILON 3 0 PS 1 (SUTURE) IMPLANT
SUT NURALON 4 0 TR CR/8 (SUTURE) ×8 IMPLANT
SUT VIC AB 0 CT1 18XCR BRD8 (SUTURE) ×4 IMPLANT
SUT VIC AB 0 CT1 8-18 (SUTURE) ×8
SUT VIC AB 2-0 CT1 18 (SUTURE) ×10 IMPLANT
SUT VIC AB 3-0 SH 8-18 (SUTURE) ×8 IMPLANT
SYR 30ML LL (SYRINGE) ×4 IMPLANT
TOWEL OR 17X24 6PK STRL BLUE (TOWEL DISPOSABLE) ×4 IMPLANT
TOWEL OR 17X26 10 PK STRL BLUE (TOWEL DISPOSABLE) ×4 IMPLANT
TRAY FOLEY W/METER SILVER 14FR (SET/KITS/TRAYS/PACK) ×4 IMPLANT
TUBE CONNECTING 12'X1/4 (SUCTIONS) ×1
TUBE CONNECTING 12X1/4 (SUCTIONS) ×3 IMPLANT
UNDERPAD 30X30 INCONTINENT (UNDERPADS AND DIAPERS) ×4 IMPLANT
WATER STERILE IRR 1000ML POUR (IV SOLUTION) ×4 IMPLANT

## 2014-12-26 NOTE — Op Note (Signed)
12/26/2014  4:50 PM  PATIENT:  Timothy Obrien  54 y.o. male  PRE-OPERATIVE DIAGNOSIS:  Solitary brain metastasis, left frontal lobe, non-small cell lung cancer  POST-OPERATIVE DIAGNOSIS:  Same  PROCEDURE:  Stereotactic guided left frontal craniotomy for tumor resection  SURGEON:  Cyndy Freeze, MD  ASSISTANTS: Ashok Pall, MD  ANESTHESIA:   General  DRAINS: None  SPECIMEN: Left frontal brain mass  INDICATION FOR PROCEDURE: Timothy Obrien is a 54 year old man with a recent diagnosis of non-small cell lung carcinoma. He was found to have a solitary left frontal brain metastasis. He was treated with stereotactic radiosurgery yesterday and presents for craniotomy for resection today. Patient understood the risks, benefits, and alternatives and potential outcomes and wished to proceed.  PROCEDURE DETAILS: After smooth induction of general endotracheal anesthesia the patient's head was fixated to the operative table with Mayfield pins. The left frontal area was clipped of hair and wiped out with alcohol. The [place] system was registered and an incision was planned.  The area was prepped and draped in the usual sterile fashion. The planned incision was infiltrated with lidocaine and Marcaine with epinephrine. The incision was opened sharply. The skin flap was elevated with periosteal elevator and secured back. A craniotomy was fashioned with 2 bur holes centered on the sagittal sinus anteriorly and posteriorly and a lateral burr hole. The craniotomy was then turned with a footplate attachment.  The dura was opened sharply in a trap door shape towards the midline. It was secured back in with Nurolon sutures. The BrainLab stereotactic system was used to identify the point where the tumor came closest to the cortical surface. At this point a 2 cm corticotomy was made and and I dissected to the surface of the tumor. At this point there was a return of thin yellow fluid from the cystic cavity.  Working around the circumference of the tumor I was able to dissected from the surrounding normal brain. It was then removed in a piecemeal fashion and sent for pathological analysis. At this point the operating microscope was used to inspect all surfaces of the resection cavity and confirm that there was no gross residual tumor present.  Hemostasis was obtained. I placed Surgicel squares in the depths of the resection cavity. I irrigated with bacitracin saline. The dura was closed with interrupted Nurolon sutures. DuraSeal was used to seal the incised dural edges. A 2-0 Vicryl tack up suture was used in the center of the dura. The bone flap was secured to the skull with titanium fixation plates. The wound was then irrigated again with bacitracin saline. The galea was closed with 2-0 Vicryl sutures. The skin was closed with staples.  Mayfield pins were removed, the wound was dressed, and the patient was allowed to waken from anesthesia.  PATIENT DISPOSITION:  PACU then ICU   Delay start of Pharmacological VTE agent (>24hrs) due to surgical blood loss or risk of bleeding:  Yes

## 2014-12-26 NOTE — Anesthesia Postprocedure Evaluation (Signed)
Anesthesia Post Note  Patient: MELINDA POTTINGER  Procedure(s) Performed: Procedure(s) (LRB): CRANIOTOMY TUMOR EXCISION with BrainLab (N/A) APPLICATION OF CRANIAL NAVIGATION (N/A)  Patient location during evaluation: PACU Anesthesia Type: General Level of consciousness: awake and alert and oriented Pain management: pain level controlled Vital Signs Assessment: post-procedure vital signs reviewed and stable Respiratory status: spontaneous breathing, nonlabored ventilation, respiratory function stable and patient connected to nasal cannula oxygen Cardiovascular status: blood pressure returned to baseline and stable Postop Assessment: no signs of nausea or vomiting Anesthetic complications: no    Last Vitals:  Filed Vitals:   12/26/14 1740 12/26/14 1745  BP: 138/111   Pulse: 48 46  Temp: 36.4 C   Resp: 14     Last Pain: There were no vitals filed for this visit.               Ramie Erman,JAMES TERRILL

## 2014-12-26 NOTE — Discharge Summary (Signed)
Date of admission: 12/26/2014  Date of discharge: 12/27/2014  Admission diagnosis: Solitary brain metastasis, left posterior frontal lobe, non-small cell lung cancer  Discharge diagnosis: Same  Procedure: Stereotactic guided left frontal craniotomy for tumor resection  Attending: Aldean Ast, M.D.  Hospital course: Timothy Obrien was admitted to the hospital on the morning surgery and taken to the operating room for the above listed operation. He tolerated this well. He awoke with a bit of a foot drop was able to flex at the hip and extended the knee well. He was observed overnight in the ICU. He worked with physical and occupational therapy. He was cleared by them and is medically neurologically stable.  Discharge medications: Resume prior medications, Norco for pain, taper dexamethasone as prescribed  Follow-up: With me in 2 weeks

## 2014-12-26 NOTE — Progress Notes (Signed)
No acute events All questions answered Awake and alert Moves all extremities well, right lower extremity monoparesis Stable Ready for OR

## 2014-12-26 NOTE — Progress Notes (Signed)
Awake, confused Moving all extremities to command Limited dorsiflexion and plantarflexion of the right foot but otherwise strong Doing well To ICU after recovery

## 2014-12-26 NOTE — Transfer of Care (Signed)
Immediate Anesthesia Transfer of Care Note  Patient: [name]  Procedure(s) Performed: Procedure(s) with comments: CRANIOTOMY TUMOR EXCISION with BrainLab (N/A) - CRANIOTOMY TUMOR EXCISION with [place] APPLICATION OF CRANIAL NAVIGATION (N/A)  Patient Location: PACU  Anesthesia Type:General  Level of Consciousness: awake, alert , oriented and patient cooperative  Airway & Oxygen Therapy: Patient Spontanous Breathing and Patient connected to nasal cannula oxygen  Post-op Assessment: Report given to RN and Post -op Vital signs reviewed and stable  Post vital signs: Reviewed and stable  Last Vitals:  Filed Vitals:   12/26/14 0939  BP: 114/77  Pulse: 53  Temp: 35.9 C  Resp: 18    Complications: No apparent anesthesia complications

## 2014-12-26 NOTE — Telephone Encounter (Signed)
Oncology Nurse Navigator Documentation  Oncology Nurse Navigator Flowsheets 12/26/2014  Navigator Encounter Type Telephone/I received notification that patient needs to change his appt.  I called and spoke with wife.  I gave her another appt time.  She was thankful for the call.   Patient Visit Type Follow-up  Treatment Phase Abnormal Scans  Interventions Coordination of Care  Coordination of Care MD Appointments  Time Spent with Patient 15

## 2014-12-26 NOTE — Anesthesia Procedure Notes (Signed)
Procedure Name: Intubation Date/Time: 12/26/2014 2:05 PM Performed by: Rogers Blocker Pre-anesthesia Checklist: Patient identified, Timeout performed, Emergency Drugs available, Suction available and Patient being monitored Patient Re-evaluated:Patient Re-evaluated prior to inductionOxygen Delivery Method: Circle system utilized Preoxygenation: Pre-oxygenation with 100% oxygen Intubation Type: IV induction Ventilation: Mask ventilation without difficulty Laryngoscope Size: Mac and 3 Grade View: Grade I Tube type: Subglottic suction tube Tube size: 8.0 mm Number of attempts: 1 Airway Equipment and Method: Stylet Placement Confirmation: ETT inserted through vocal cords under direct vision,  breath sounds checked- equal and bilateral,  positive ETCO2 and CO2 detector Secured at: 23 cm Tube secured with: Tape Dental Injury: Teeth and Oropharynx as per pre-operative assessment

## 2014-12-26 NOTE — Anesthesia Preprocedure Evaluation (Signed)
Anesthesia Evaluation  Patient identified by MRN, date of birth, ID band Patient awake  General Assessment Comment:Keppra and Klonipin this am , now mildly sedated  Reviewed: Allergy & Precautions, NPO status , Patient's Chart, lab work & pertinent test results  Airway Mallampati: II  TM Distance: >3 FB Neck ROM: Full    Dental  (+) Teeth Intact   Pulmonary Current Smoker,  Lung ca c mets to brain   breath sounds clear to auscultation       Cardiovascular negative cardio ROS   Rhythm:Regular Rate:Normal     Neuro/Psych Seizures -,     GI/Hepatic negative GI ROS, Neg liver ROS,   Endo/Other  negative endocrine ROS  Renal/GU negative Renal ROS     Musculoskeletal   Abdominal   Peds  Hematology negative hematology ROS (+)   Anesthesia Other Findings   Reproductive/Obstetrics                             Anesthesia Physical Anesthesia Plan  ASA: III  Anesthesia Plan: General   Post-op Pain Management:    Induction: Intravenous  Airway Management Planned: Oral ETT  Additional Equipment: Arterial line  Intra-op Plan:   Post-operative Plan: Extubation in OR and Possible Post-op intubation/ventilation  Informed Consent: I have reviewed the patients History and Physical, chart, labs and discussed the procedure including the risks, benefits and alternatives for the proposed anesthesia with the patient or authorized representative who has indicated his/her understanding and acceptance.   Dental advisory given  Plan Discussed with: CRNA and Surgeon  Anesthesia Plan Comments:         Anesthesia Quick Evaluation

## 2014-12-27 ENCOUNTER — Inpatient Hospital Stay (HOSPITAL_COMMUNITY): Payer: Managed Care, Other (non HMO)

## 2014-12-27 LAB — CBC
HEMATOCRIT: 40.4 % (ref 39.0–52.0)
Hemoglobin: 14.1 g/dL (ref 13.0–17.0)
MCH: 34.7 pg — ABNORMAL HIGH (ref 26.0–34.0)
MCHC: 34.9 g/dL (ref 30.0–36.0)
MCV: 99.5 fL (ref 78.0–100.0)
Platelets: 219 10*3/uL (ref 150–400)
RBC: 4.06 MIL/uL — AB (ref 4.22–5.81)
RDW: 14.2 % (ref 11.5–15.5)
WBC: 29.5 10*3/uL — AB (ref 4.0–10.5)

## 2014-12-27 LAB — BASIC METABOLIC PANEL
Anion gap: 6 (ref 5–15)
BUN: 20 mg/dL (ref 6–20)
CHLORIDE: 105 mmol/L (ref 101–111)
CO2: 24 mmol/L (ref 22–32)
Calcium: 8.1 mg/dL — ABNORMAL LOW (ref 8.9–10.3)
Creatinine, Ser: 0.79 mg/dL (ref 0.61–1.24)
GFR calc non Af Amer: >60 mL/min (ref >60)
Glucose, Bld: 127 mg/dL — ABNORMAL HIGH (ref 65–99)
POTASSIUM: 4.2 mmol/L (ref 3.5–5.1)
SODIUM: 135 mmol/L (ref 135–145)

## 2014-12-27 MED ORDER — GADOBENATE DIMEGLUMINE 529 MG/ML IV SOLN
15.0000 mL | Freq: Once | INTRAVENOUS | Status: AC
  Administered 2014-12-27: 15 mL via INTRAVENOUS

## 2014-12-27 NOTE — Evaluation (Signed)
Physical Therapy Evaluation Patient Details Name: Timothy Obrien MRN: [id number] DOB: 1961-01-10 Today's Date: 12/27/2014   History of Present Illness  Pt is a 54 y/o M s/p craniotomy after large Lt frontal lobe mass was noted on CT.  PMH includes Rt sided weakness.  Clinical Impression  Pt admitted with above diagnosis. Pt currently with functional limitations due to the deficits listed below (see PT Problem List). Timothy Obrien is at his baseline level of mobility at mod I>supervision.  He will have prn assist at d/c from family.  Pt will benefit from skilled PT to increase their independence and safety with mobility to allow discharge to the venue listed below.      Follow Up Recommendations No PT follow up;Supervision - Intermittent    Equipment Recommendations  None recommended by PT    Recommendations for Other Services       Precautions / Restrictions Precautions Precautions: Fall Precaution Comments: recent h/o Rt foot drop per chart review and pt report, has been improving  Restrictions Weight Bearing Restrictions: No      Mobility  Bed Mobility               General bed mobility comments: Pt sitting in recliner chair upon PT arrival  Transfers Overall transfer level: Modified independent Equipment used: None             General transfer comment: No cues or physical assist needed  Ambulation/Gait Ambulation/Gait assistance: Supervision Ambulation Distance (Feet): 200 Feet Assistive device: None Gait Pattern/deviations: Decreased dorsiflexion - right;Step-through pattern;Decreased stride length     General Gait Details: Very slight dec DF on Rt LE.  No physical assist needed.  Stairs            Wheelchair Mobility    Modified Rankin (Stroke Patients Only)       Balance Overall balance assessment: Modified Independent                                           Pertinent Vitals/Pain Pain Assessment: No/denies pain     Home Living Family/patient expects to be discharged to:: Private residence Living Arrangements: Alone Available Help at Discharge: Family;Available PRN/intermittently Type of Home: House Home Access: Stairs to enter Entrance Stairs-Rails: None Entrance Stairs-Number of Steps: 2 Home Layout: Multi-level Home Equipment: Cane - single point      Prior Function Level of Independence: Independent               Hand Dominance   Dominant Hand: Right    Extremity/Trunk Assessment   Upper Extremity Assessment: Overall WFL for tasks assessed           Lower Extremity Assessment: RLE deficits/detail RLE Deficits / Details: grossly 4/5 strength w/ MMT.  Pt unable to functionally DF Rt foot when asked to stand on heels       Communication   Communication: No difficulties  Cognition Arousal/Alertness: Awake/alert Behavior During Therapy: WFL for tasks assessed/performed Overall Cognitive Status: Within Functional Limits for tasks assessed                      General Comments General comments (skin integrity, edema, etc.): Encouraged pt to use both railings while ascending/descending flight at home.  Pt is to have assist from family to enter home at d/c.     Exercises Total Joint Exercises Marching in  Standing: AROM;Both;10 reps;Standing (no deficits noted)      Assessment/Plan    PT Assessment Patient needs continued PT services  PT Diagnosis Difficulty walking   PT Problem List Decreased strength;Decreased mobility;Decreased safety awareness  PT Treatment Interventions Gait training;Stair training;Functional mobility training;Therapeutic activities;Balance training;Therapeutic exercise;Neuromuscular re-education;Patient/family education   PT Goals (Current goals can be found in the Care Plan section) Acute Rehab PT Goals Patient Stated Goal: go home today PT Goal Formulation: With patient Time For Goal Achievement: 01/03/15 Potential to Achieve  Goals: Good    Frequency Min 2X/week   Barriers to discharge Inaccessible home environment steps to 2nd floor where bedroom/bathroom is.  Pt reports he mostly sleeps downstairs on couch    Co-evaluation               End of Session Equipment Utilized During Treatment: Gait belt Activity Tolerance: Patient tolerated treatment well Patient left: in chair;with call bell/phone within reach;with family/visitor present Nurse Communication: Mobility status         Time: 1115-5208 PT Time Calculation (min) (ACUTE ONLY): 21 min   Charges:   PT Evaluation $Initial PT Evaluation Tier I: 1 Procedure     PT G CodesJoslyn Hy PT, DPT [phone] Pager: [phone] 12/27/2014, 12:04 PM

## 2014-12-27 NOTE — Progress Notes (Signed)
Discharge orders received, pt for discharge home today, IV D/C with dressing CDI to L temporal head.  D/C instructions and Rx given with verbalized understanding.  Family at bedside to assist pt with discharge. Staff brought pt downstairs via wheelchair.

## 2014-12-27 NOTE — Discharge Summary (Signed)
  Physician Discharge Summary  Patient ID: Timothy Obrien MRN: [id number] DOB/AGE: Apr 21, 1960 54 y.o.  Admit date: 12/26/2014 Discharge date: 12/27/2014  Admission Diagnoses: Brain metastasis  Discharge Diagnoses: Same Active Problems:   S/P craniotomy   Brain metastasis [facility])   Discharged Condition: Stable  Hospital Course:  Mrs. Timothy Obrien is a 54 y.o. male electively admitted after left craniotomy for resection of tumor. He was at baseline postop, was observed overnight in the ICU without issue. He was ambulating well, tolerating diet, with pain under control.   Treatments: Surgery - right craniotomy for resection of tumor  Discharge Exam: Blood pressure 112/79, pulse 46, temperature 97.7 F (36.5 C), temperature source Oral, resp. rate 17, height 6' (1.829 m), weight 72.887 kg (160 lb 11 oz), SpO2 97 %. Awake, alert, oriented Speech fluent, appropriate CN grossly intact 5/5 BUE/BLE x mild right DF weakness at baseline Wound c/d/i  Disposition: 01-Home or Self Care     Medication List    TAKE these medications        acetaminophen 325 MG tablet  Commonly known as:  TYLENOL  Take 2 tablets (650 mg total) by mouth every 6 (six) hours as needed for mild pain, moderate pain, fever or headache (or Fever >/= 101).     clonazePAM 0.5 MG tablet  Commonly known as:  KLONOPIN  Take 1 tablet (0.5 mg total) by mouth 3 (three) times daily as needed for anxiety.     dexamethasone 4 MG tablet  Commonly known as:  DECADRON  Take 1 tablet (4 mg total) by mouth 4 (four) times daily. Taper dose after discharge by taking 1 tablet three times daily for four days, then 1 tablet twice daily for four days, then take 1 tablet daily until you come back to clinic and we will discuss tapering further.     HYDROcodone-acetaminophen 7.5-325 MG tablet  Commonly known as:  NORCO  Take 1-2 tablets by mouth every 6 (six) hours as needed for moderate pain.     levETIRAcetam 750 MG  tablet  Commonly known as:  KEPPRA  Take 1 tablet (750 mg total) by mouth 2 (two) times daily.     pantoprazole 20 MG tablet  Commonly known as:  PROTONIX  Take 1 tablet (20 mg total) by mouth daily.           Follow-up Information    Follow up with Kevan Ny Ditty, MD In 2 weeks.   Specialty:  Neurosurgery   Contact information:   [address] [place] [address] [phone]       Signed: [name] 12/27/2014, 9:40 AM

## 2014-12-28 NOTE — Progress Notes (Signed)
  Radiation Oncology         [phone] ________________________________  Name: Timothy Obrien MRN: [id number]  Date: 12/25/2014  DOB: 12/21/1960  End of Treatment Note   ICD-9-CM ICD-10-CM    1. Brain metastases ([facility]) 198.3 C79.31     DIAGNOSIS: 54 y.o. 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  ExacTrac registration was performed for each couch angle.    Narrative: The patient tolerated radiation treatment relatively well.     Plan: The patient has completed radiation treatment. The patient will return to radiation oncology clinic for routine followup in one month. I advised him to call or return sooner if he has any questions or concerns related to his recovery or treatment. ________________________________  Sheral Apley. Tammi Klippel, M.D.

## 2014-12-29 ENCOUNTER — Ambulatory Visit: Payer: Managed Care, Other (non HMO) | Admitting: Internal Medicine

## 2014-12-29 ENCOUNTER — Encounter (HOSPITAL_COMMUNITY): Payer: Self-pay | Admitting: Neurological Surgery

## 2014-12-29 NOTE — Telephone Encounter (Signed)
Left message for patient Timothy Obrien Timothy Obrien [phone] to return call to reschedule appt.

## 2015-01-01 ENCOUNTER — Encounter: Payer: Self-pay | Admitting: *Deleted

## 2015-01-01 ENCOUNTER — Ambulatory Visit (HOSPITAL_BASED_OUTPATIENT_CLINIC_OR_DEPARTMENT_OTHER): Payer: Managed Care, Other (non HMO) | Admitting: Internal Medicine

## 2015-01-01 ENCOUNTER — Encounter: Payer: Self-pay | Admitting: Internal Medicine

## 2015-01-01 ENCOUNTER — Other Ambulatory Visit (HOSPITAL_BASED_OUTPATIENT_CLINIC_OR_DEPARTMENT_OTHER): Payer: Managed Care, Other (non HMO)

## 2015-01-01 ENCOUNTER — Institutional Professional Consult (permissible substitution) (INDEPENDENT_AMBULATORY_CARE_PROVIDER_SITE_OTHER): Payer: Managed Care, Other (non HMO) | Admitting: Cardiothoracic Surgery

## 2015-01-01 ENCOUNTER — Other Ambulatory Visit: Payer: Self-pay | Admitting: *Deleted

## 2015-01-01 VITALS — BP 117/65 | HR 67 | Temp 97.8°F | Resp 18 | Ht 72.0 in | Wt 166.2 lb

## 2015-01-01 DIAGNOSIS — C3432 Malignant neoplasm of lower lobe, left bronchus or lung: Secondary | ICD-10-CM

## 2015-01-01 DIAGNOSIS — C7931 Secondary malignant neoplasm of brain: Secondary | ICD-10-CM | POA: Diagnosis not present

## 2015-01-01 LAB — CBC WITH DIFFERENTIAL/PLATELET
BASO%: 0.2 % (ref 0.0–2.0)
BASOS ABS: 0 10*3/uL (ref 0.0–0.1)
EOS%: 0.6 % (ref 0.0–7.0)
Eosinophils Absolute: 0.1 10*3/uL (ref 0.0–0.5)
HEMATOCRIT: 42.4 % (ref 38.4–49.9)
HEMOGLOBIN: 14.2 g/dL (ref 13.0–17.1)
LYMPH#: 1.1 10*3/uL (ref 0.9–3.3)
LYMPH%: 7.9 % — ABNORMAL LOW (ref 14.0–49.0)
MCH: 33.1 pg (ref 27.2–33.4)
MCHC: 33.5 g/dL (ref 32.0–36.0)
MCV: 99 fL — ABNORMAL HIGH (ref 79.3–98.0)
MONO#: 0.4 10*3/uL (ref 0.1–0.9)
MONO%: 2.7 % (ref 0.0–14.0)
NEUT%: 88.6 % — ABNORMAL HIGH (ref 39.0–75.0)
NEUTROS ABS: 11.8 10*3/uL — AB (ref 1.5–6.5)
Platelets: 266 10*3/uL (ref 140–400)
RBC: 4.28 10*6/uL (ref 4.20–5.82)
RDW: 13.8 % (ref 11.0–14.6)
WBC: 13.3 10*3/uL — AB (ref 4.0–10.3)

## 2015-01-01 LAB — COMPREHENSIVE METABOLIC PANEL
ALBUMIN: 3 g/dL — AB (ref 3.5–5.0)
ALK PHOS: 59 U/L (ref 40–150)
ALT: 24 U/L (ref 0–55)
AST: 19 U/L (ref 5–34)
Anion Gap: 12 mEq/L — ABNORMAL HIGH (ref 3–11)
BILIRUBIN TOTAL: 0.5 mg/dL (ref 0.20–1.20)
BUN: 17.5 mg/dL (ref 7.0–26.0)
CALCIUM: 8.4 mg/dL (ref 8.4–10.4)
CO2: 18 mEq/L — ABNORMAL LOW (ref 22–29)
CREATININE: 0.8 mg/dL (ref 0.7–1.3)
Chloride: 102 mEq/L (ref 98–109)
EGFR: >90 mL/min/{1.73_m2} (ref >90)
GLUCOSE: 117 mg/dL (ref 70–140)
Potassium: 3.8 mEq/L (ref 3.5–5.1)
SODIUM: 132 meq/L — AB (ref 136–145)
TOTAL PROTEIN: 6 g/dL — AB (ref 6.4–8.3)

## 2015-01-01 NOTE — Progress Notes (Signed)
Oncology Nurse Navigator Documentation  Oncology Nurse Navigator Flowsheets 01/01/2015  Navigator Encounter Type Clinic/MDC/spoke with patient and his family today at [facility].  Gave and explained information on lung cancer and next steps.   Patient Visit Type Initial  Treatment Phase Other  Barriers/Navigation Needs Education  Education Coping with Diagnosis/ Prognosis;Newly Diagnosed Cancer Education  Interventions Education Method  Education Method Written;Verbal  Time Spent with Patient 30         Thoracic Treatment Summary Name:Timothy Obrien Date:01/01/2015 DOB:[birth year] Your Medical Team Medical Oncologist:Dr. Julien Nordmann Radiation Oncologist:Dr. Tammi Klippel Surgeon:Dr.Gerhardt Type and Stage of Lung Cancer Non-Small Cell Carcinoma: Adenocarcinoma   Clinical Stage:  No matching staging information was found for the patient.   Clinical stage is based on radiology exams.  Pathological stage will be determined after surgery.  Staging is based on the size of the tumor, involvement of lymph nodes or not, and whether or not the cancer center has spread. Recommendations Recommendations: Lung resection then chemotherapy   These recommendations are based on information available as of today's consult.  This is subject to change depending further testing or exams. Next Steps Next Step: Medical Oncology will set up follow up appointments  Surgery will set up follow up appointments Barriers to Care What do you perceive as a potential barrier that may prevent you from receiving your treatment plan? Education Training and development officer  Resources Given: [employer] booklet on lung cancer Research scientist (life sciences) at [employer].Radonna Ricker [phone] Fall Risk Information    Questions Norton Blizzard, RN BSN Thoracic Oncology Nurse Navigator at [place] is a nurse navigator that is available to assist you through your cancer journey.  She can answer your  questions and/or provide resources regarding your treatment plan, emotional support, or financial concerns.

## 2015-01-01 NOTE — Progress Notes (Signed)
[address]       [place] [address]             [phone]                    Allin K Real [facility] Medical Record #[id number] Date of Birth: January 06, 1961  Referring: Curt Bears, MD Primary Care: Pcp Not In System  Chief Complaint:   lung mass   History of Present Illness:    Timothy Obrien 54 y.o. male is seen in the office  today for evaluation of possible resection of left lower lobe superior segment. He present last week to ER with new onset of seizure. 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 He under went radiation rx to brain and resection 6 days ago. Patient is long term and current smoker.   He is recovering quickly from recent brain surgery. We discussed the need and techniques to quit smoking  Current Activity/ Functional Status:  Patient is independent with mobility/ambulation, transfers, ADL's, IADL's.   Zubrod Score: At the time of surgery this patient's most appropriate activity status/level should be described as: '[]'$     0    Normal activity, no symptoms '[x]'$     1    Restricted in physical strenuous activity but ambulatory, able to do out light work '[]'$     2    Ambulatory and capable of self care, unable to do work activities, up and about               >50 % of waking hours                              '[]'$     3    Only limited self care, in bed greater than 50% of waking hours '[]'$     4    Completely disabled, no self care, confined to bed or chair '[]'$     5    Moribund   Past Medical History  Diagnosis Date  . 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 Pneumonia   . Bronchitis   . Tobacco abuse   . Seizures ([place])     last 12/11/14    Past Surgical History  Procedure Laterality Date  . Hernia repair    . Vasectomy    . Video bronchoscopy Bilateral 12/16/2014    Procedure: VIDEO BRONCHOSCOPY WITH FLUORO;  Surgeon: Collene Gobble, MD;  Location: [facility];  Service: Cardiopulmonary;  Laterality: Bilateral;  . Craniotomy N/A 12/26/2014    Procedure: CRANIOTOMY TUMOR EXCISION with BrainLab;  Surgeon: Kevan Ny Ditty, MD;  Location: [facility];  Service: Neurosurgery;  Laterality: N/A;  CRANIOTOMY TUMOR EXCISION with Stealth  . Application of cranial navigation N/A 12/26/2014    Procedure: APPLICATION OF CRANIAL NAVIGATION;  Surgeon: Kevan Ny Ditty, MD;  Location: [facility];  Service: Neurosurgery;  Laterality: N/A;    Family History  Problem Relation Age of Onset  . Breast cancer Mother   . Arthritis Father   . Colon polyps Mother     Social History   Social History  . Marital Status: Divorced    Spouse Name: N/A  . Number of Children: N/A  . Years of Education: N/A   Occupational History  . Not on file.  Social History Main Topics  . Smoking status: Current Every Day Smoker -- 1.50 packs/day for 36 years    Types: Cigarettes    Start date: 12/14/1979  . Smokeless tobacco: Never Used  . Alcohol Use: 0.0 oz/week    0 Standard drinks or equivalent per week     Comment: daily 6 beers a day for the past fefw years.   . Drug Use: Yes    Special: Marijuana  . Sexual Activity: Not Currently   Other Topics Concern  . 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 He does have a cat but no other home pets. No mold exposure. Recent travel to [place] but with symptoms at the onset of travel.    History  Smoking status  . Current Every Day Smoker -- 1.50 packs/day for 36 years  . Types: Cigarettes  . Start date: 12/14/1979  Smokeless tobacco  . Never Used    History  Alcohol Use  . 0.0 oz/week  . 0 Standard drinks or equivalent per week    Comment: daily 6 beers a day for the past fefw years.      No Known Allergies  Current Outpatient Prescriptions  Medication Sig Dispense Refill  . acetaminophen (TYLENOL) 325 MG tablet  Take 2 tablets (650 mg total) by mouth every 6 (six) hours as needed for mild pain, moderate pain, fever or headache (or Fever >/= 101). (Patient not taking: Reported on 01/01/2015)    . clonazePAM (KLONOPIN) 0.5 MG tablet Take 1 tablet (0.5 mg total) by mouth 3 (three) times daily as needed for anxiety. (Patient not taking: Reported on 01/01/2015) 15 tablet 0  . dexamethasone (DECADRON) 4 MG tablet Take 1 tablet (4 mg total) by mouth 4 (four) times daily. Taper dose after discharge by taking 1 tablet three times daily for four days, then 1 tablet twice daily for four days, then take 1 tablet daily until you come back to clinic and we will discuss tapering further. 120 tablet 0  . HYDROcodone-acetaminophen (NORCO) 7.5-325 MG tablet Take 1-2 tablets by mouth every 6 (six) hours as needed for moderate pain. 30 tablet 0  . levETIRAcetam (KEPPRA) 750 MG tablet Take 1 tablet (750 mg total) by mouth 2 (two) times daily. 60 tablet 0  . pantoprazole (PROTONIX) 20 MG tablet Take 1 tablet (20 mg total) by mouth daily. 30 tablet 0   No current facility-administered medications for this visit.      Review of Systems:     Cardiac Review of Systems: Y or N  Chest Pain [  N  ]  Resting SOB [ N  ] Exertional SOB  [ Y ]  Orthopnea [  N]   Pedal Edema [   ]    Palpitations [  ] Syncope  [  ]   Presyncope [   ]  General Review of Systems: [Y] = yes [  ]=no Constitional: recent weight change Aqua.Slicker ];  Wt loss over the last 3 months [   ] anorexia [  ]; fatigue [  ]; nausea [  ]; night sweats [  ]; fever [  ]; or chills [  ];          Dental: poor dentition[  ]; Last Dentist visit:   Eye : blurred vision [  ]; diplopia [   ]; vision changes [  ];  Amaurosis fugax[  ]; Resp:  cough [  ];  wheezing[  ];  hemoptysis[  ]; shortness of breath[  ]; paroxysmal nocturnal dyspnea[  ]; dyspnea on exertion[  ]; or orthopnea[  ];  GI:  gallstones[  ], vomiting[  ];  dysphagia[  ]; melena[  ];  hematochezia [  ]; heartburn[  ];   Hx  of  Colonoscopy[  ]; GU: kidney stones [  ]; hematuria[  ];   dysuria [  ];  nocturia[  ];  history of     obstruction [  ]; urinary frequency [  ]             Skin: rash, swelling[  ];, hair loss[  ];  peripheral edema[  ];  or itching[  ]; Musculosketetal: myalgias[  ];  joint swelling[  ];  joint erythema[  ];  joint pain[  ];  back pain[  ];  Heme/Lymph: bruising[  ];  bleeding[  ];  anemia[  ];  Neuro: TIA[  ];  headaches[  ];  stroke[  ];  vertigo[  ];  seizures[  ];   paresthesias[  ];  difficulty walking[  ];  Psych:depression[  ]; anxiety[  ];  Endocrine: diabetes[  ];  thyroid dysfunction[  ];  Immunizations: Flu up to date [  ]; Pneumococcal up to date [  ];  Other:    PHYSICAL EXAMINATION: General appearance: alert, cooperative and appears stated age Head: Normocephalic, without obvious abnormality, STAPLES SCALP LEFT FROM RESECTION, MINOR ECCOHYMOSIS AROUND LEFT EYE Neck: no adenopathy, no carotid bruit, no JVD, supple, symmetrical, trachea midline and thyroid not enlarged, symmetric, no tenderness/mass/nodules Lymph nodes: Cervical, supraclavicular, and axillary nodes normal. Resp: clear to auscultation bilaterally Back: symmetric, no curvature. ROM normal. No CVA tenderness. Cardio: regular rate and rhythm, S1, S2 normal, no murmur, click, rub or gallop GI: soft, non-tender; bowel sounds normal; no masses,  no organomegaly Extremities: extremities normal, atraumatic, no cyanosis or edema Neurologic: Grossly normal  Diagnostic Studies & Laboratory data:     Recent Radiology Findings:   Ct Head Wo Contrast  12/12/2014  CLINICAL DATA:  54 year old with right arm and leg numbness and weakness today. Initial encounter. EXAM: CT HEAD WITHOUT CONTRAST TECHNIQUE: Contiguous axial images were obtained from the base of the skull through the vertex without intravenous contrast. COMPARISON:  None. FINDINGS: There is a 3.0 x 2.7 cm well-circumscribed low-density left frontal lobe mass  with moderate surrounding vasogenic edema. This mass demonstrates no evidence of hemorrhage or calcification. There is no associated midline shift or hydrocephalus. No other masses are identified. There is no evidence of acute stroke. The visualized paranasal sinuses, mastoid air cells and middle ears are otherwise clear. The calvarium is intact. IMPRESSION: 1. Large left frontal lobe low-density mass with surrounding vasogenic edema. This appearance is most consistent with an isolated metastasis. Additional considerations include primary brain tumor and cerebral abscess. 2. No evidence of acute intracranial hemorrhage, midline shift, hydrocephalus or acute stroke. 3. Further evaluation with MRI without and with contrast recommended. 4. These results were called by telephone at the time of interpretation on 12/12/2014 at 2:00 pm to Dr. Sherwood Gambler , who verbally acknowledged these results. Electronically Signed   By: Richardean Sale M.D.   On: 12/12/2014 14:07   Ct Chest W Contrast  12/13/2014  CLINICAL DATA:  MRI showing a 3-4 cm intracranial mass favored to represent solitary metastasis, possibly endocrine or gastrointestinal origin. EXAM: CT CHEST, ABDOMEN, AND PELVIS WITH CONTRAST TECHNIQUE: Multidetector  CT imaging of the chest, abdomen and pelvis was performed following the standard protocol during bolus administration of intravenous contrast. CONTRAST:  68m OMNIPAQUE IOHEXOL 300 MG/ML  SOLN COMPARISON:  None. FINDINGS: CT CHEST FINDINGS Mediastinum/Lymph Nodes: Scattered small lymph nodes within the mediastinum. No mass or enlarged lymph nodes within the mediastinum or perihilar regions. Thoracic aorta is normal in caliber and configuration. Heart size is normal. No pericardial effusion. Lungs/Pleura: Masslike consolidation within the superior segment of the left lower lobe, measuring 3.7 x 3.7 cm. Emphysematous changes noted within the upper lobes bilaterally, moderate in degree. Mild atelectasis noted  at each lung base. Musculoskeletal: No chest wall mass or suspicious bone lesions identified. CT ABDOMEN PELVIS FINDINGS Hepatobiliary: No masses or other significant abnormality. Pancreas: No mass, inflammatory changes, or other significant abnormality. Spleen: Within normal limits in size and appearance. Adrenals/Urinary Tract: 8 mm hypodense lesion within the superior pole of the right kidney, too small to characterize but most likely a small benign cyst. Kidneys otherwise unremarkable bilaterally without stone or hydronephrosis. Left adrenal gland is somewhat bulbous in configuration but without discrete mass. Right adrenal gland appears normal. Stomach/Bowel: Bowel is normal in caliber. No evidence of bowel wall thickening or bowel wall inflammation seen. Appendix is normal. Stomach is moderately distended with recently ingested materials but otherwise unremarkable. Vascular/Lymphatic: No pathologically enlarged lymph nodes. No evidence of abdominal aortic aneursym. Atherosclerotic changes are seen along the walls of the normal-caliber abdominal aorta. Reproductive: No mass or other significant abnormality. Other: No free fluid or soft tissue mass is identified within the abdomen or pelvis. No free intraperitoneal air. Musculoskeletal: No suspicious bone lesions identified. Mild degenerative changes seen within the lumbar spine but no acute osseous abnormality. IMPRESSION: 1. Masslike consolidation within the superior segment of the left lower lobe, measuring 3.7 x 3.7 cm, highly suspicious for neoplastic pulmonary mass. 2. No other evidence of neoplastic process seen within the chest, abdomen or pelvis. No acute findings. 3. 8 mm hypodense lesion within the superior pole of the right kidney, too small to characterize but most likely a small benign cyst. Given the presumed intracranial metastasis and today's masslike consolidation in the left lower lobe, would consider follow-up renal protocol CT or MRI at some  point to ensure benignity. Electronically Signed   By: SFranki CabotM.D.   On: 12/13/2014 08:28   Mr BJeri CosWRCContrast  12/27/2014  CLINICAL DATA:  Small cell lung cancer is status post radiosurgery and craniotomy. EXAM: MRI HEAD WITHOUT AND WITH CONTRAST TECHNIQUE: Multiplanar, multiecho pulse sequences of the brain and surrounding structures were obtained without and with intravenous contrast. CONTRAST:  163mMULTIHANCE GADOBENATE DIMEGLUMINE 529 MG/ML IV SOLN COMPARISON:  MRI brain 12/19/2014 FINDINGS: The left frontal lobe mass has been resected. Blood products are evident within the surgical cavity which measures 2.7 x 1.3 x 2.1 cm. Minimal surrounding vasogenic edema is evident without significant extension from the preoperative study. T2 changes extend into the subcortical white matter of the precentral gyrus. Mild diffuse dural enhancement is within normal limits immediately following surgery. No new parenchymal enhancement is present. There is no evidence for acute infarct or hemorrhage otherwise. No significant extra-axial fluid collection is present. Pneumocephalus is noted. Flow is present in the major intracranial arteries. The globes and orbits are intact. A polyp mucous retention cyst is again noted in the right maxillary sinus. The remaining paranasal sinuses and the mastoid air cells are clear. IMPRESSION: 1. Interval resection of left frontal lobe  peripherally enhancing cystic mass lesion. 2. Blood products are present within the resection cavity which measures 2.7 x 1.3 x 2.1 cm. 3. No acute infarct or hemorrhage otherwise. Minimal surrounding vasogenic edema is stable. Electronically Signed   By: San Morelle M.D.   On: 12/27/2014 07:05   Mr Kizzie Fantasia Contrast  12/19/2014  CLINICAL DATA:  54 year old male with brain metastases, probable lung primary. Stereotactic radiosurgery planning. Subsequent encounter. EXAM: MRI HEAD WITHOUT AND WITH CONTRAST TECHNIQUE: Multiplanar,  multiecho pulse sequences of the brain and surrounding structures were obtained without and with intravenous contrast. CONTRAST:  34m MULTIHANCE GADOBENATE DIMEGLUMINE 529 MG/ML IV SOLN COMPARISON:  Brain MRI 12/12/2014. FINDINGS: 32 mm by 37 mm round mass at the superior left frontal gyrus with T1 isointense cystic internal contents and rim enhancement is stable. Minimal mineralization or hemosiderin in the wall of the lesion. Mild surrounding vasogenic edema considering the lesion size is stable. Mild regional mass effect. No other brain mass identified. Major intracranial vascular flow voids are stable. GPearline Cablesand white matter signal elsewhere are stable. No restricted diffusion or evidence of acute infarction. No ventriculomegaly. No acute intracranial hemorrhage identified. Negative pituitary, cervicomedullary junction and visualized cervical spinal cord. Visible bone marrow signal is within normal limits. Visible internal auditory structures appear normal. Mastoids and paranasal sinuses are stable. Orbit and scalp soft tissues are stable. IMPRESSION: 1. Stable solitary left superior frontal gyrus cystic mass with rim enhancement measuring up to a 3.7 cm. Stable mild surrounding edema and regional mass effect. 2. No other brain mass identified.  No new intracranial abnormality. Electronically Signed   By: HGenevie AnnM.D.   On: 12/19/2014 19:12   Mr BJeri CosWZOContrast  12/12/2014  CLINICAL DATA:  Transient right-sided symptoms which have become worse, now with right-sided weakness. History of right-sided twitching. EXAM: MRI HEAD WITHOUT AND WITH CONTRAST TECHNIQUE: Multiplanar, multiecho pulse sequences of the brain and surrounding structures were obtained without and with intravenous contrast. CONTRAST:  133mMULTIHANCE GADOBENATE DIMEGLUMINE 529 MG/ML IV SOLN COMPARISON:  CT head earlier today. FINDINGS: A superficial, LEFT posterior frontal intra-axial mass is redemonstrated. Measurements are 30 x 32 x 36 mm  (R-L x A-P x C-C). The lesion shows no diffusion restriction, and no significant susceptibility except perhaps a tiny area on its most superficial aspect. The lesion is T2 and FLAIR hyperintense, displays prominent T1 shortening, and is surrounded by a thin but somewhat irregular enhancing rim. There is mild vasogenic edema. There is no significant left-to-right shift.No other similar lesions are seen. Otherwise normal-appearing brain. Normal cerebral volume without significant white matter disease. Flow voids are maintained throughout the major intracranial vessels. No midline abnormality. No osseous findings. Extracranial soft tissues unremarkable. IMPRESSION: LEFT posterior frontal superficial intra-axial mass with peripheral enhancement, measuring 30 x 32 x 36 mm. The lesion appears cystic on CT, but displays prominent T1 shortening centrally on MR, suggesting mucin or proteinaceous content. No diffusion restriction to suggest abscess. A solitary metastasis is favored, possibly of endocrine or gastrointestinal origin. Given the lack of susceptibility on gradient sequence, melanoma is less likely but not excluded. Electronically Signed   By: JoStaci Righter.D.   On: 12/12/2014 18:28   Ct Abdomen Pelvis W Contrast  12/13/2014  CLINICAL DATA:  MRI showing a 3-4 cm intracranial mass favored to represent solitary metastasis, possibly endocrine or gastrointestinal origin. EXAM: CT CHEST, ABDOMEN, AND PELVIS WITH CONTRAST TECHNIQUE: Multidetector CT imaging of the chest, abdomen and pelvis was  performed following the standard protocol during bolus administration of intravenous contrast. CONTRAST:  24m OMNIPAQUE IOHEXOL 300 MG/ML  SOLN COMPARISON:  None. FINDINGS: CT CHEST FINDINGS Mediastinum/Lymph Nodes: Scattered small lymph nodes within the mediastinum. No mass or enlarged lymph nodes within the mediastinum or perihilar regions. Thoracic aorta is normal in caliber and configuration. Heart size is normal. No  pericardial effusion. Lungs/Pleura: Masslike consolidation within the superior segment of the left lower lobe, measuring 3.7 x 3.7 cm. Emphysematous changes noted within the upper lobes bilaterally, moderate in degree. Mild atelectasis noted at each lung base. Musculoskeletal: No chest wall mass or suspicious bone lesions identified. CT ABDOMEN PELVIS FINDINGS Hepatobiliary: No masses or other significant abnormality. Pancreas: No mass, inflammatory changes, or other significant abnormality. Spleen: Within normal limits in size and appearance. Adrenals/Urinary Tract: 8 mm hypodense lesion within the superior pole of the right kidney, too small to characterize but most likely a small benign cyst. Kidneys otherwise unremarkable bilaterally without stone or hydronephrosis. Left adrenal gland is somewhat bulbous in configuration but without discrete mass. Right adrenal gland appears normal. Stomach/Bowel: Bowel is normal in caliber. No evidence of bowel wall thickening or bowel wall inflammation seen. Appendix is normal. Stomach is moderately distended with recently ingested materials but otherwise unremarkable. Vascular/Lymphatic: No pathologically enlarged lymph nodes. No evidence of abdominal aortic aneursym. Atherosclerotic changes are seen along the walls of the normal-caliber abdominal aorta. Reproductive: No mass or other significant abnormality. Other: No free fluid or soft tissue mass is identified within the abdomen or pelvis. No free intraperitoneal air. Musculoskeletal: No suspicious bone lesions identified. Mild degenerative changes seen within the lumbar spine but no acute osseous abnormality. IMPRESSION: 1. Masslike consolidation within the superior segment of the left lower lobe, measuring 3.7 x 3.7 cm, highly suspicious for neoplastic pulmonary mass. 2. No other evidence of neoplastic process seen within the chest, abdomen or pelvis. No acute findings. 3. 8 mm hypodense lesion within the superior pole  of the right kidney, too small to characterize but most likely a small benign cyst. Given the presumed intracranial metastasis and today's masslike consolidation in the left lower lobe, would consider follow-up renal protocol CT or MRI at some point to ensure benignity. Electronically Signed   By: SFranki CabotM.D.   On: 12/13/2014 08:28   Nm Pet Image Initial (pi) Skull Base To Thigh  12/24/2014  CLINICAL DATA:  Initial treatment strategy for Lung cancer. EXAM: NUCLEAR MEDICINE PET SKULL BASE TO THIGH TECHNIQUE: 11.6 mCi F-18 FDG was injected intravenously. Full-ring PET imaging was performed from the skull base to thigh after the radiotracer. CT data was obtained and used for attenuation correction and anatomic localization. FASTING BLOOD GLUCOSE:  Value: 106 mg/dl COMPARISON:  12/13/14 FINDINGS: NECK Area of photopenia involving the left frontal lobe is identified pars pontine to recently diagnosed brain metastases. No hypermetabolic lymph nodes within the soft tissues of the neck. CHEST Moderate changes of centrilobular emphysema. Mass within the superior segment of the left lower lobe is identified medially. This measures 3.7 cm and an SUV max equal to 9.9. Hypermetabolic left hilar lymph node has an SUV max equal to 4.5. ABDOMEN/PELVIS No abnormal hypermetabolic activity within the liver, pancreas, adrenal glands, or spleen. No hypermetabolic lymph nodes in the abdomen or pelvis. SKELETON No focal hypermetabolic activity to suggest skeletal metastasis. IMPRESSION: 1. Malignant range FDG uptake is associated with the mass located in the superior segment of left lower lobe. 2. Hypermetabolic left hilar lymph node compatible  with metastatic adenopathy. 3. No evidence for metastasis to the abdomen, pelvis or visualized osseous structures. Electronically Signed   By: Kerby Moors M.D.   On: 12/24/2014 14:54   Dg Chest Port 1 View  12/16/2014  CLINICAL DATA:  Status post bronchoscopy EXAM: PORTABLE CHEST 1  VIEW COMPARISON:  12/13/2014 FINDINGS: Cardiomediastinal silhouette is stable. Persistent masslike consolidation left suprahilar posteromedially. No acute infiltrate or pulmonary edema. There is no pneumothorax. IMPRESSION: Persistent masslike consolidation left suprahilar posterior medially. No active disease. No pneumothorax. Electronically Signed   By: Lahoma Crocker M.D.   On: 12/16/2014 08:12   Dg C-arm Bronchoscopy  12/16/2014  CLINICAL DATA:  C-ARM BRONCHOSCOPY Fluoroscopy was utilized by the requesting physician.  No radiographic interpretation.     I have independently reviewed the above radiologic studies.  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 I have recommended to the patient we proceed with Bronchoscopy, left VATS, lung resection and node dissection. Risks and options have been reviewed with patient and family. He understands that following surgery he will need chemotherapy as discussed with Dr Mckinley Jewel. Will obtain preop PFT's  Plan Surgery Jan 4  I  spent 40 minutes counseling the patient face to face and 50% or more the  time was spent in counseling and coordination of care. The total time spent in the appointment was 60 minutes.  Grace Isaac MD      [address] [place] [address] Office [phone]   [place] [phone]  01/01/2015 8:29 PM

## 2015-01-01 NOTE — Progress Notes (Addendum)
[place] Clinical Social Work  Clinical Social Work met with patient/family and Futures trader at [facility] appointment to offer support and assess for psychosocial needs.  Patient was accompanied by ex-wife, daughter, and girlfriend.  Patient was not interested in discussing needs with CSW at time of visit.  CSW discussed COBRA and disability with patient's ex-wife.  ONCBCN DISTRESS SCREENING 01/01/2015  Screening Type Initial Screening  Distress experienced in past week (1-10) 8  Practical problem type Insurance;Work/school;Transportation  Emotional problem type Depression;Nervousness/Anxiety;Adjusting to illness;Isolation/feeling alone;Feeling hopeless;Boredom  Spiritual/Religous concerns type Loss of sense of purpose;Facing my mortality  Referral to clinical social work Yes     Clinical Social Work briefly discussed Clinical Social Work role and [employer] support programs/services.  Clinical Social Work encouraged patient to call with any additional questions or concerns.   Polo Riley, MSW, LCSW, OSW-C Clinical Social Worker [facility] [phone]

## 2015-01-01 NOTE — Progress Notes (Signed)
[place] Telephone:([phone]) [phone]   Fax:([phone]) [phone] Multidisciplinary thoracic oncology clinic  CONSULT NOTE  REFERRING PHYSICIAN: Dr. Tyler Pita  REASON FOR CONSULTATION:  54 years old white male recently diagnosed with metastatic lung cancer.  HPI Timothy Obrien is a 54 y.o. male with long history of smoking and no significant past medical history except for hernia repair. The patient presented to the emergency department on 12/12/2014 complaining of right arm and leg weakness as well as seizure started earlier that day. He had CT scan of the head followed by MRI of the brain on 12/12/2014 and it showed left posterior frontal superficial intra-axial mass with peripheral enhancement measuring 3.0 x 3.2 x 3.6 cm. The lesion appear cystic on CT scan. A solitary metastasis was favored. The patient had CT scan of the chest, abdomen and pelvis performed on 12/13/2014 and it showed masslike consolidation within the superior segment of the left lower lobe measuring 3.7 x 3.7 cm highly suspicious for neoplastic pulmonary mass. There was no other evidence of metastatic breast is seen within the chest, abdomen or pelvis and no acute findings. On 12/16/2014 the patient underwent flexible video fiberoptic bronchoscopy and biopsies by Dr. Lamonte Sakai. The final cytology of the transbronchial brushing of the left lower lobe (Accession: [id number]) with positive for non-small cell carcinoma, favoring adenocarcinoma. The patient was seen by Dr. Tammi Klippel and he underwent stereotactic radiotherapy to the solitary brain lesion on 12/25/2014. This was followed by stereotactic guided left frontal craniotomy for tumor resection. The final pathology (Accession: [id number]) showed metastatic carcinoma. immunohistochemical stains were performed. The malignant cells are positive for cytokeratin 7, TTF-1, and Napsin-A. They are essentially negative for p63, cytokeratin 5/6, and cytokeratin 20. These  findings are consistent with primary lung adenocarcinoma. A PET scan on 12/24/2014 showed malignant range FDG uptake associated with the mass located in superior segment of the left lower lobe as well as hypermetabolic left hilar lymph node compatible with metastatic adenopathy but no evidence for metastatic disease in the abdomen, pelvis or osseous structures. The patient was referred to me today for evaluation and recommendation regarding treatment of his condition. When seen today he is feeling fine with no specific complaints except for mild cough. He denied having any significant chest pain or shortness of breath. He denied having any hemoptysis. He denied having any significant weight loss or night sweats. He has no headache or visual changes. Family history significant for mother with breast cancer. The patient is single and he was accompanied today by his ex-wife Barbaraann Faster, his daughter. Apolonio Schneiders and his girlfriend Hilda Blades. He used to work in TEFL teacher. He has a history of smoking 1 pack per day for around 35 years and unfortunately he continues to smoke. He also drinks around 6 beers every day. The patient also has a history of marijuana abuse.   HPI  Past Medical History  Diagnosis Date  . Lung cancer ([place])     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  . Pneumonia   . Bronchitis   . Tobacco abuse   . Seizures ([place])     last 12/11/14    Past Surgical History  Procedure Laterality Date  . Hernia repair    . Vasectomy    . Video bronchoscopy Bilateral 12/16/2014    Procedure: VIDEO BRONCHOSCOPY WITH FLUORO;  Surgeon: Collene Gobble, MD;  Location: [facility];  Service: Cardiopulmonary;  Laterality: Bilateral;  . Craniotomy N/A 12/26/2014    Procedure:  CRANIOTOMY TUMOR EXCISION with BrainLab;  Surgeon: Kevan Ny Ditty, MD;  Location: [facility];  Service: Neurosurgery;  Laterality: N/A;  CRANIOTOMY TUMOR EXCISION with [facility]  . Application of cranial navigation N/A  12/26/2014    Procedure: APPLICATION OF CRANIAL NAVIGATION;  Surgeon: Kevan Ny Ditty, MD;  Location: [facility];  Service: Neurosurgery;  Laterality: N/A;    Family History  Problem Relation Age of Onset  . Breast cancer Mother   . Arthritis Father   . Colon polyps Mother     Social History Social History  Substance Use Topics  . Smoking status: Current Every Day Smoker -- 1.50 packs/day for 36 years    Types: Cigarettes    Start date: 12/14/1979  . Smokeless tobacco: Never Used  . Alcohol Use: 0.0 oz/week    0 Standard drinks or equivalent per week     Comment: daily 6 beers a day for the past fefw years.     No Known Allergies  Current Outpatient Prescriptions  Medication Sig Dispense Refill  . dexamethasone (DECADRON) 4 MG tablet Take 1 tablet (4 mg total) by mouth 4 (four) times daily. Taper dose after discharge by taking 1 tablet three times daily for four days, then 1 tablet twice daily for four days, then take 1 tablet daily until you come back to clinic and we will discuss tapering further. 120 tablet 0  . HYDROcodone-acetaminophen (NORCO) 7.5-325 MG tablet Take 1-2 tablets by mouth every 6 (six) hours as needed for moderate pain. 30 tablet 0  . levETIRAcetam (KEPPRA) 750 MG tablet Take 1 tablet (750 mg total) by mouth 2 (two) times daily. 60 tablet 0  . pantoprazole (PROTONIX) 20 MG tablet Take 1 tablet (20 mg total) by mouth daily. 30 tablet 0  . acetaminophen (TYLENOL) 325 MG tablet Take 2 tablets (650 mg total) by mouth every 6 (six) hours as needed for mild pain, moderate pain, fever or headache (or Fever >/= 101). (Patient not taking: Reported on 01/01/2015)    . clonazePAM (KLONOPIN) 0.5 MG tablet Take 1 tablet (0.5 mg total) by mouth 3 (three) times daily as needed for anxiety. (Patient not taking: Reported on 01/01/2015) 15 tablet 0   No current facility-administered medications for this visit.    Review of Systems  Constitutional: negative Eyes:  negative Ears, nose, mouth, throat, and face: negative Respiratory: positive for dyspnea on exertion Cardiovascular: negative Gastrointestinal: negative Genitourinary:negative Integument/breast: negative Hematologic/lymphatic: negative Musculoskeletal:negative Neurological: negative Behavioral/Psych: negative Endocrine: negative Allergic/Immunologic: negative  Physical Exam  WVP:XTGGY, healthy, no distress, well nourished and well developed SKIN: skin color, texture, turgor are normal, no rashes or significant lesions HEAD: Normocephalic, No masses, lesions, tenderness or abnormalities EYES: normal, PERRLA, Conjunctiva are pink and non-injected EARS: External ears normal, Canals clear OROPHARYNX:no exudate, no erythema and lips, buccal mucosa, and tongue normal  NECK: supple, no adenopathy, no JVD LYMPH:  no palpable lymphadenopathy, no hepatosplenomegaly LUNGS: clear to auscultation , and palpation HEART: regular rate & rhythm, no murmurs and no gallops ABDOMEN:abdomen soft, non-tender, normal bowel sounds and no masses or organomegaly BACK: Back symmetric, no curvature., No CVA tenderness EXTREMITIES:no joint deformities, effusion, or inflammation, no edema, no skin discoloration  NEURO: alert & oriented x 3 with fluent speech, no focal motor/sensory deficits  PERFORMANCE STATUS: ECOG 1  LABORATORY DATA: Lab Results  Component Value Date   WBC 13.3* 01/01/2015   HGB 14.2 01/01/2015   HCT 42.4 01/01/2015   MCV 99.0* 01/01/2015   PLT  266 01/01/2015      Chemistry      Component Value Date/Time   NA 132* 01/01/2015 1445   NA 135 12/27/2014 0353   K 3.8 01/01/2015 1445   K 4.2 12/27/2014 0353   CL 105 12/27/2014 0353   CO2 18* 01/01/2015 1445   CO2 24 12/27/2014 0353   BUN 17.5 01/01/2015 1445   BUN 20 12/27/2014 0353   CREATININE 0.8 01/01/2015 1445   CREATININE 0.79 12/27/2014 0353      Component Value Date/Time   CALCIUM 8.4 01/01/2015 1445   CALCIUM 8.1*  12/27/2014 0353   ALKPHOS 59 01/01/2015 1445   ALKPHOS 71 12/13/2014 0230   AST 19 01/01/2015 1445   AST 27 12/13/2014 0230   ALT 24 01/01/2015 1445   ALT 19 12/13/2014 0230   BILITOT 0.50 01/01/2015 1445   BILITOT 0.8 12/13/2014 0230       RADIOGRAPHIC STUDIES: Ct Head Wo Contrast  12/12/2014  CLINICAL DATA:  54 year old with right arm and leg numbness and weakness today. Initial encounter. EXAM: CT HEAD WITHOUT CONTRAST TECHNIQUE: Contiguous axial images were obtained from the base of the skull through the vertex without intravenous contrast. COMPARISON:  None. FINDINGS: There is a 3.0 x 2.7 cm well-circumscribed low-density left frontal lobe mass with moderate surrounding vasogenic edema. This mass demonstrates no evidence of hemorrhage or calcification. There is no associated midline shift or hydrocephalus. No other masses are identified. There is no evidence of acute stroke. The visualized paranasal sinuses, mastoid air cells and middle ears are otherwise clear. The calvarium is intact. IMPRESSION: 1. Large left frontal lobe low-density mass with surrounding vasogenic edema. This appearance is most consistent with an isolated metastasis. Additional considerations include primary brain tumor and cerebral abscess. 2. No evidence of acute intracranial hemorrhage, midline shift, hydrocephalus or acute stroke. 3. Further evaluation with MRI without and with contrast recommended. 4. These results were called by telephone at the time of interpretation on 12/12/2014 at 2:00 pm to Dr. Sherwood Gambler , who verbally acknowledged these results. Electronically Signed   By: Richardean Sale M.D.   On: 12/12/2014 14:07   Ct Chest W Contrast  12/13/2014  CLINICAL DATA:  MRI showing a 3-4 cm intracranial mass favored to represent solitary metastasis, possibly endocrine or gastrointestinal origin. EXAM: CT CHEST, ABDOMEN, AND PELVIS WITH CONTRAST TECHNIQUE: Multidetector CT imaging of the chest, abdomen and  pelvis was performed following the standard protocol during bolus administration of intravenous contrast. CONTRAST:  44m OMNIPAQUE IOHEXOL 300 MG/ML  SOLN COMPARISON:  None. FINDINGS: CT CHEST FINDINGS Mediastinum/Lymph Nodes: Scattered small lymph nodes within the mediastinum. No mass or enlarged lymph nodes within the mediastinum or perihilar regions. Thoracic aorta is normal in caliber and configuration. Heart size is normal. No pericardial effusion. Lungs/Pleura: Masslike consolidation within the superior segment of the left lower lobe, measuring 3.7 x 3.7 cm. Emphysematous changes noted within the upper lobes bilaterally, moderate in degree. Mild atelectasis noted at each lung base. Musculoskeletal: No chest wall mass or suspicious bone lesions identified. CT ABDOMEN PELVIS FINDINGS Hepatobiliary: No masses or other significant abnormality. Pancreas: No mass, inflammatory changes, or other significant abnormality. Spleen: Within normal limits in size and appearance. Adrenals/Urinary Tract: 8 mm hypodense lesion within the superior pole of the right kidney, too small to characterize but most likely a small benign cyst. Kidneys otherwise unremarkable bilaterally without stone or hydronephrosis. Left adrenal gland is somewhat bulbous in configuration but without discrete mass. Right adrenal gland appears  normal. Stomach/Bowel: Bowel is normal in caliber. No evidence of bowel wall thickening or bowel wall inflammation seen. Appendix is normal. Stomach is moderately distended with recently ingested materials but otherwise unremarkable. Vascular/Lymphatic: No pathologically enlarged lymph nodes. No evidence of abdominal aortic aneursym. Atherosclerotic changes are seen along the walls of the normal-caliber abdominal aorta. Reproductive: No mass or other significant abnormality. Other: No free fluid or soft tissue mass is identified within the abdomen or pelvis. No free intraperitoneal air. Musculoskeletal: No  suspicious bone lesions identified. Mild degenerative changes seen within the lumbar spine but no acute osseous abnormality. IMPRESSION: 1. Masslike consolidation within the superior segment of the left lower lobe, measuring 3.7 x 3.7 cm, highly suspicious for neoplastic pulmonary mass. 2. No other evidence of neoplastic process seen within the chest, abdomen or pelvis. No acute findings. 3. 8 mm hypodense lesion within the superior pole of the right kidney, too small to characterize but most likely a small benign cyst. Given the presumed intracranial metastasis and today's masslike consolidation in the left lower lobe, would consider follow-up renal protocol CT or MRI at some point to ensure benignity. Electronically Signed   By: Franki Cabot M.D.   On: 12/13/2014 08:28   Mr [name] ZO Contrast  12/27/2014  CLINICAL DATA:  Small cell lung cancer is status post radiosurgery and craniotomy. EXAM: MRI HEAD WITHOUT AND WITH CONTRAST TECHNIQUE: Multiplanar, multiecho pulse sequences of the brain and surrounding structures were obtained without and with intravenous contrast. CONTRAST:  11m MULTIHANCE GADOBENATE DIMEGLUMINE 529 MG/ML IV SOLN COMPARISON:  MRI brain 12/19/2014 FINDINGS: The left frontal lobe mass has been resected. Blood products are evident within the surgical cavity which measures 2.7 x 1.3 x 2.1 cm. Minimal surrounding vasogenic edema is evident without significant extension from the preoperative study. T2 changes extend into the subcortical white matter of the precentral gyrus. Mild diffuse dural enhancement is within normal limits immediately following surgery. No new parenchymal enhancement is present. There is no evidence for acute infarct or hemorrhage otherwise. No significant extra-axial fluid collection is present. Pneumocephalus is noted. Flow is present in the major intracranial arteries. The globes and orbits are intact. A polyp mucous retention cyst is again noted in the right maxillary  sinus. The remaining paranasal sinuses and the mastoid air cells are clear. IMPRESSION: 1. Interval resection of left frontal lobe peripherally enhancing cystic mass lesion. 2. Blood products are present within the resection cavity which measures 2.7 x 1.3 x 2.1 cm. 3. No acute infarct or hemorrhage otherwise. Minimal surrounding vasogenic edema is stable. Electronically Signed   By: CSan MorelleM.D.   On: 12/27/2014 07:05   Mr BKizzie FantasiaContrast  12/19/2014  CLINICAL DATA:  54year old male with brain metastases, probable lung primary. Stereotactic radiosurgery planning. Subsequent encounter. EXAM: MRI HEAD WITHOUT AND WITH CONTRAST TECHNIQUE: Multiplanar, multiecho pulse sequences of the brain and surrounding structures were obtained without and with intravenous contrast. CONTRAST:  157mMULTIHANCE GADOBENATE DIMEGLUMINE 529 MG/ML IV SOLN COMPARISON:  Brain MRI 12/12/2014. FINDINGS: 32 mm by 37 mm round mass at the superior left frontal gyrus with T1 isointense cystic internal contents and rim enhancement is stable. Minimal mineralization or hemosiderin in the wall of the lesion. Mild surrounding vasogenic edema considering the lesion size is stable. Mild regional mass effect. No other brain mass identified. Major intracranial vascular flow voids are stable. GrPearline Cablesnd white matter signal elsewhere are stable. No restricted diffusion or evidence of acute infarction. No ventriculomegaly.  No acute intracranial hemorrhage identified. Negative pituitary, cervicomedullary junction and visualized cervical spinal cord. Visible bone marrow signal is within normal limits. Visible internal auditory structures appear normal. Mastoids and paranasal sinuses are stable. Orbit and scalp soft tissues are stable. IMPRESSION: 1. Stable solitary left superior frontal gyrus cystic mass with rim enhancement measuring up to a 3.7 cm. Stable mild surrounding edema and regional mass effect. 2. No other brain mass identified.   No new intracranial abnormality. Electronically Signed   By: Genevie Ann M.D.   On: 12/19/2014 19:12   Mr [name] PI Contrast  12/12/2014  CLINICAL DATA:  Transient right-sided symptoms which have become worse, now with right-sided weakness. History of right-sided twitching. EXAM: MRI HEAD WITHOUT AND WITH CONTRAST TECHNIQUE: Multiplanar, multiecho pulse sequences of the brain and surrounding structures were obtained without and with intravenous contrast. CONTRAST:  37m MULTIHANCE GADOBENATE DIMEGLUMINE 529 MG/ML IV SOLN COMPARISON:  CT head earlier today. FINDINGS: A superficial, LEFT posterior frontal intra-axial mass is redemonstrated. Measurements are 30 x 32 x 36 mm (R-L x A-P x C-C). The lesion shows no diffusion restriction, and no significant susceptibility except perhaps a tiny area on its most superficial aspect. The lesion is T2 and FLAIR hyperintense, displays prominent T1 shortening, and is surrounded by a thin but somewhat irregular enhancing rim. There is mild vasogenic edema. There is no significant left-to-right shift.No other similar lesions are seen. Otherwise normal-appearing brain. Normal cerebral volume without significant white matter disease. Flow voids are maintained throughout the major intracranial vessels. No midline abnormality. No osseous findings. Extracranial soft tissues unremarkable. IMPRESSION: LEFT posterior frontal superficial intra-axial mass with peripheral enhancement, measuring 30 x 32 x 36 mm. The lesion appears cystic on CT, but displays prominent T1 shortening centrally on MR, suggesting mucin or proteinaceous content. No diffusion restriction to suggest abscess. A solitary metastasis is favored, possibly of endocrine or gastrointestinal origin. Given the lack of susceptibility on gradient sequence, melanoma is less likely but not excluded. Electronically Signed   By: JStaci RighterM.D.   On: 12/12/2014 18:28   Ct Abdomen Pelvis W Contrast  12/13/2014  CLINICAL DATA:   MRI showing a 3-4 cm intracranial mass favored to represent solitary metastasis, possibly endocrine or gastrointestinal origin. EXAM: CT CHEST, ABDOMEN, AND PELVIS WITH CONTRAST TECHNIQUE: Multidetector CT imaging of the chest, abdomen and pelvis was performed following the standard protocol during bolus administration of intravenous contrast. CONTRAST:  839mOMNIPAQUE IOHEXOL 300 MG/ML  SOLN COMPARISON:  None. FINDINGS: CT CHEST FINDINGS Mediastinum/Lymph Nodes: Scattered small lymph nodes within the mediastinum. No mass or enlarged lymph nodes within the mediastinum or perihilar regions. Thoracic aorta is normal in caliber and configuration. Heart size is normal. No pericardial effusion. Lungs/Pleura: Masslike consolidation within the superior segment of the left lower lobe, measuring 3.7 x 3.7 cm. Emphysematous changes noted within the upper lobes bilaterally, moderate in degree. Mild atelectasis noted at each lung base. Musculoskeletal: No chest wall mass or suspicious bone lesions identified. CT ABDOMEN PELVIS FINDINGS Hepatobiliary: No masses or other significant abnormality. Pancreas: No mass, inflammatory changes, or other significant abnormality. Spleen: Within normal limits in size and appearance. Adrenals/Urinary Tract: 8 mm hypodense lesion within the superior pole of the right kidney, too small to characterize but most likely a small benign cyst. Kidneys otherwise unremarkable bilaterally without stone or hydronephrosis. Left adrenal gland is somewhat bulbous in configuration but without discrete mass. Right adrenal gland appears normal. Stomach/Bowel: Bowel is normal in caliber. No evidence  of bowel wall thickening or bowel wall inflammation seen. Appendix is normal. Stomach is moderately distended with recently ingested materials but otherwise unremarkable. Vascular/Lymphatic: No pathologically enlarged lymph nodes. No evidence of abdominal aortic aneursym. Atherosclerotic changes are seen along the  walls of the normal-caliber abdominal aorta. Reproductive: No mass or other significant abnormality. Other: No free fluid or soft tissue mass is identified within the abdomen or pelvis. No free intraperitoneal air. Musculoskeletal: No suspicious bone lesions identified. Mild degenerative changes seen within the lumbar spine but no acute osseous abnormality. IMPRESSION: 1. Masslike consolidation within the superior segment of the left lower lobe, measuring 3.7 x 3.7 cm, highly suspicious for neoplastic pulmonary mass. 2. No other evidence of neoplastic process seen within the chest, abdomen or pelvis. No acute findings. 3. 8 mm hypodense lesion within the superior pole of the right kidney, too small to characterize but most likely a small benign cyst. Given the presumed intracranial metastasis and today's masslike consolidation in the left lower lobe, would consider follow-up renal protocol CT or MRI at some point to ensure benignity. Electronically Signed   By: Franki Cabot M.D.   On: 12/13/2014 08:28   Nm Pet Image Initial (pi) Skull Base To Thigh  12/24/2014  CLINICAL DATA:  Initial treatment strategy for Lung cancer. EXAM: NUCLEAR MEDICINE PET SKULL BASE TO THIGH TECHNIQUE: 11.6 mCi F-18 FDG was injected intravenously. Full-ring PET imaging was performed from the skull base to thigh after the radiotracer. CT data was obtained and used for attenuation correction and anatomic localization. FASTING BLOOD GLUCOSE:  Value: 106 mg/dl COMPARISON:  12/13/14 FINDINGS: NECK Area of photopenia involving the left frontal lobe is identified pars pontine to recently diagnosed brain metastases. No hypermetabolic lymph nodes within the soft tissues of the neck. CHEST Moderate changes of centrilobular emphysema. Mass within the superior segment of the left lower lobe is identified medially. This measures 3.7 cm and an SUV max equal to 9.9. Hypermetabolic left hilar lymph node has an SUV max equal to 4.5. ABDOMEN/PELVIS No  abnormal hypermetabolic activity within the liver, pancreas, adrenal glands, or spleen. No hypermetabolic lymph nodes in the abdomen or pelvis. SKELETON No focal hypermetabolic activity to suggest skeletal metastasis. IMPRESSION: 1. Malignant range FDG uptake is associated with the mass located in the superior segment of left lower lobe. 2. Hypermetabolic left hilar lymph node compatible with metastatic adenopathy. 3. No evidence for metastasis to the abdomen, pelvis or visualized osseous structures. Electronically Signed   By: Kerby Moors M.D.   On: 12/24/2014 14:54   Dg Chest Port 1 View  12/16/2014  CLINICAL DATA:  Status post bronchoscopy EXAM: PORTABLE CHEST 1 VIEW COMPARISON:  12/13/2014 FINDINGS: Cardiomediastinal silhouette is stable. Persistent masslike consolidation left suprahilar posteromedially. No acute infiltrate or pulmonary edema. There is no pneumothorax. IMPRESSION: Persistent masslike consolidation left suprahilar posterior medially. No active disease. No pneumothorax. Electronically Signed   By: Lahoma Crocker M.D.   On: 12/16/2014 08:12   Dg C-arm Bronchoscopy  12/16/2014  CLINICAL DATA:  C-ARM BRONCHOSCOPY Fluoroscopy was utilized by the requesting physician.  No radiographic interpretation.    ASSESSMENT: This is a very pleasant 54 years old white male recently diagnosed with a stage IV (T2a, N1, M1b) non-small cell lung cancer, adenocarcinoma presented with left lower lobe lung mass in addition to left hilar adenopathy and solitary metastatic brain lesion is status post stereotactic radiotherapy and surgical resection diagnosed in December 2016.   PLAN: I had a lengthy discussion with the  patient and his family today about his current disease  stage, prognosis and treatment options. The patient had surgical resection of the brain lesion and he has locally advanced disease in the left lung. I explained to the patient and his family that he has stage IV disease but with the  locally advanced disease in the lung, history may benefit from surgical resection of the lung lesion. This will be followed by systemic chemotherapy. The patient will be seen later today by Dr. Servando Snare for evaluation and discussion of the surgical option. I will arrange for the patient to come back for follow-up visit in 3 weeks after his surgical resection for reevaluation and more detailed discussion of the systemic treatment. I will also request his tissue to be sent for molecular study by [facility] as well as PDL 1 expression. The patient and his family agreed to the current plan. He was seen during the month to suppress thoracic oncology clinic today by medical oncology, thoracic surgery, thoracic navigator and social worker. He was advised to call immediately if he has any concerning symptoms in the interval. The patient voices understanding of current disease status and treatment options and is in agreement with the current care plan.  All questions were answered. The patient knows to call the clinic with any problems, questions or concerns. We can certainly see the patient much sooner if necessary.  Thank you so much for allowing me to participate in the care of Timothy Obrien. I will continue to follow up the patient with you and assist in his care.  I spent 40 minutes counseling the patient face to face. The total time spent in the appointment was 60 minutes.  Disclaimer: This note was dictated with voice recognition software. Similar sounding words can inadvertently be transcribed and may not be corrected upon review.   Clella Mckeel K. January 01, 2015, 5:08 PM

## 2015-01-02 ENCOUNTER — Encounter: Payer: Self-pay | Admitting: *Deleted

## 2015-01-02 ENCOUNTER — Other Ambulatory Visit: Payer: Self-pay

## 2015-01-02 DIAGNOSIS — C3432 Malignant neoplasm of lower lobe, left bronchus or lung: Secondary | ICD-10-CM

## 2015-01-02 DIAGNOSIS — R918 Other nonspecific abnormal finding of lung field: Secondary | ICD-10-CM

## 2015-01-02 NOTE — Progress Notes (Signed)
I received a call from Dr. Lyndon Code.  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  It will be sent today to [facility] and PDL 1 testing.

## 2015-01-02 NOTE — Progress Notes (Signed)
Oncology Nurse Navigator Documentation  Oncology Nurse Navigator Flowsheets 01/02/2015  Navigator Encounter Type Other/I followed up on patient's appt.  He is scheduled for surgery on 01/14/15.  I completed POF for schedule to set up a follow up appt with Dr. Julien Nordmann approximately on Jan 18th per Dr. Worthy Flank instructions  Patient Visit Type Follow-up  Treatment Phase Other  Interventions Coordination of Care  Coordination of Care MD Appointments  Time Spent with Patient 15

## 2015-01-06 ENCOUNTER — Telehealth: Payer: Self-pay | Admitting: Internal Medicine

## 2015-01-06 ENCOUNTER — Inpatient Hospital Stay (HOSPITAL_COMMUNITY): Admission: RE | Admit: 2015-01-06 | Payer: Managed Care, Other (non HMO) | Source: Ambulatory Visit

## 2015-01-06 NOTE — Telephone Encounter (Signed)
Not able to reach patient by phone or leave message re 1/18 lab/fu. schedule mailed

## 2015-01-07 ENCOUNTER — Ambulatory Visit: Payer: Managed Care, Other (non HMO) | Admitting: Internal Medicine

## 2015-01-07 ENCOUNTER — Ambulatory Visit (HOSPITAL_COMMUNITY)
Admission: RE | Admit: 2015-01-07 | Discharge: 2015-01-07 | Disposition: A | Discharge status: Home / Self Care | Payer: Managed Care, Other (non HMO) | Source: Ambulatory Visit | Attending: Cardiothoracic Surgery | Admitting: Cardiothoracic Surgery

## 2015-01-07 DIAGNOSIS — C3432 Malignant neoplasm of lower lobe, left bronchus or lung: Secondary | ICD-10-CM | POA: Insufficient documentation

## 2015-01-07 LAB — PULMONARY FUNCTION TEST
DL/VA % pred: 46 %
DL/VA: 2.2 ml/min/mmHg/L
DLCO cor % pred: 44 %
DLCO cor: 15.05 ml/min/mmHg
DLCO unc % pred: 44 %
DLCO unc: 14.88 ml/min/mmHg
FEF 25-75 Post: 1.97 L/sec
FEF 25-75 Pre: 1.96 L/sec
FEF2575-%Change-Post: 0 %
FEF2575-%Pred-Post: 57 %
FEF2575-%Pred-Pre: 57 %
FEV1-%Change-Post: -2 %
FEV1-%Pred-Post: 82 %
FEV1-%Pred-Pre: 84 %
FEV1-Post: 3.25 L
FEV1-Pre: 3.33 L
FEV1FVC-%Change-Post: 3 %
FEV1FVC-%Pred-Pre: 85 %
FEV6-%Change-Post: -3 %
FEV6-%Pred-Post: 94 %
FEV6-%Pred-Pre: 97 %
FEV6-Post: 4.67 L
FEV6-Pre: 4.85 L
FEV6FVC-%Change-Post: 2 %
FEV6FVC-%Pred-Post: 101 %
FEV6FVC-%Pred-Pre: 99 %
FVC-%Change-Post: -5 %
FVC-%Pred-Post: 92 %
FVC-%Pred-Pre: 98 %
FVC-Post: 4.78 L
FVC-Pre: 5.08 L
Post FEV1/FVC ratio: 68 %
Post FEV6/FVC ratio: 98 %
Pre FEV1/FVC ratio: 66 %
Pre FEV6/FVC Ratio: 96 %
RV % pred: 100 %
RV: 2.17 L
TLC % pred: 101 %
TLC: 7.29 L

## 2015-01-07 MED ORDER — ALBUTEROL SULFATE (2.5 MG/3ML) 0.083% IN NEBU
2.5000 mg | INHALATION_SOLUTION | Freq: Once | RESPIRATORY_TRACT | Status: AC
  Administered 2015-01-07: 2.5 mg via RESPIRATORY_TRACT

## 2015-01-09 ENCOUNTER — Inpatient Hospital Stay (HOSPITAL_COMMUNITY)
Admission: RE | Admit: 2015-01-09 | Discharge: 2015-01-09 | Disposition: A | Discharge status: Home / Self Care | Payer: Managed Care, Other (non HMO) | Source: Ambulatory Visit

## 2015-01-13 ENCOUNTER — Encounter (HOSPITAL_COMMUNITY): Payer: Self-pay

## 2015-01-13 ENCOUNTER — Encounter (HOSPITAL_COMMUNITY): Payer: Self-pay | Admitting: *Deleted

## 2015-01-13 ENCOUNTER — Ambulatory Visit (INDEPENDENT_AMBULATORY_CARE_PROVIDER_SITE_OTHER): Payer: Managed Care, Other (non HMO) | Admitting: *Deleted

## 2015-01-13 DIAGNOSIS — C3432 Malignant neoplasm of lower lobe, left bronchus or lung: Secondary | ICD-10-CM

## 2015-01-13 LAB — FUNGUS CULTURE W SMEAR: FUNGAL SMEAR: NONE SEEN

## 2015-01-13 MED ORDER — HYDROMORPHONE HCL 1 MG/ML IJ SOLN: 0.2500 mg | INTRAMUSCULAR | Status: DC | PRN

## 2015-01-13 MED ORDER — DEXTROSE 5 % IV SOLN: 1.5000 g | INTRAVENOUS | Status: DC

## 2015-01-13 MED ORDER — PROMETHAZINE HCL 25 MG/ML IJ SOLN: 6.2500 mg | INTRAMUSCULAR | Status: DC | PRN

## 2015-01-13 NOTE — Progress Notes (Signed)
6 Minute Walk Test Results  Patient: Timothy Obrien Date:  01/13/2015   Supplemental O2 during test? NO      Baseline   End  Time   1424    1430 Heartrate  66    68 Dyspnea  0    0 Fatigue  0    0.5 O2 sat   98%    97% Blood pressure 115/82    117/79   Patient ambulated at a regular slow pace for a total distance of 1176 feet with no stops.  Ambulation was limited primarily due to n/a  Overall the test was tolerated very well

## 2015-01-13 NOTE — Progress Notes (Signed)
Timothy Obrien has returned to the office for a 6 minute walk test at the request of Dr. Servando Snare.  He is scheduled for a left lung wedge resection tomorrow.  He is recuperating well from his recent brain surgery.  He is in good spirits and in good physical condition, walking well with no shortness of breath or unusual fatigue.  The test was done and documented. Marland Kitchen

## 2015-01-14 ENCOUNTER — Inpatient Hospital Stay (HOSPITAL_COMMUNITY): Payer: BLUE CROSS/BLUE SHIELD | Admitting: Anesthesiology

## 2015-01-14 ENCOUNTER — Inpatient Hospital Stay (HOSPITAL_COMMUNITY)
Admission: RE | Admit: 2015-01-14 | Discharge: 2015-01-18 | DRG: 164 | Disposition: A | Discharge status: Home / Self Care | Payer: BLUE CROSS/BLUE SHIELD | Source: Ambulatory Visit | Attending: Cardiothoracic Surgery | Admitting: Cardiothoracic Surgery

## 2015-01-14 ENCOUNTER — Encounter (HOSPITAL_COMMUNITY)
Admission: RE | Disposition: A | Discharge status: Home / Self Care | Payer: Self-pay | Source: Ambulatory Visit | Attending: Cardiothoracic Surgery

## 2015-01-14 ENCOUNTER — Inpatient Hospital Stay (HOSPITAL_COMMUNITY): Payer: BLUE CROSS/BLUE SHIELD

## 2015-01-14 ENCOUNTER — Encounter (HOSPITAL_COMMUNITY): Payer: Self-pay | Admitting: *Deleted

## 2015-01-14 DIAGNOSIS — C3432 Malignant neoplasm of lower lobe, left bronchus or lung: Secondary | ICD-10-CM | POA: Diagnosis present

## 2015-01-14 DIAGNOSIS — Z9689 Presence of other specified functional implants: Secondary | ICD-10-CM

## 2015-01-14 DIAGNOSIS — R222 Localized swelling, mass and lump, trunk: Secondary | ICD-10-CM

## 2015-01-14 DIAGNOSIS — F17210 Nicotine dependence, cigarettes, uncomplicated: Secondary | ICD-10-CM | POA: Diagnosis present

## 2015-01-14 DIAGNOSIS — C3490 Malignant neoplasm of unspecified part of unspecified bronchus or lung: Secondary | ICD-10-CM | POA: Diagnosis present

## 2015-01-14 DIAGNOSIS — E440 Moderate protein-calorie malnutrition: Secondary | ICD-10-CM | POA: Diagnosis present

## 2015-01-14 DIAGNOSIS — C7931 Secondary malignant neoplasm of brain: Secondary | ICD-10-CM | POA: Diagnosis present

## 2015-01-14 DIAGNOSIS — Z6821 Body mass index (BMI) 21.0-21.9, adult: Secondary | ICD-10-CM

## 2015-01-14 DIAGNOSIS — R569 Unspecified convulsions: Secondary | ICD-10-CM | POA: Diagnosis present

## 2015-01-14 DIAGNOSIS — R918 Other nonspecific abnormal finding of lung field: Secondary | ICD-10-CM | POA: Diagnosis present

## 2015-01-14 HISTORY — PX: VIDEO ASSISTED THORACOSCOPY (VATS)/WEDGE RESECTION: SHX6174

## 2015-01-14 HISTORY — PX: VIDEO BRONCHOSCOPY: SHX5072

## 2015-01-14 HISTORY — DX: Reserved for inherently not codable concepts without codable children: IMO0001

## 2015-01-14 LAB — POCT I-STAT 7, (LYTES, BLD GAS, ICA,H+H)
Acid-base deficit: 9 mmol/L — ABNORMAL HIGH (ref 0.0–2.0)
Acid-base deficit: 7 mmol/L — ABNORMAL HIGH (ref 0.0–2.0)
Bicarbonate: 19.9 mEq/L — ABNORMAL LOW (ref 20.0–24.0)
Bicarbonate: 21.4 mEq/L (ref 20.0–24.0)
Calcium, Ion: 1.06 mmol/L — ABNORMAL LOW (ref 1.12–1.23)
Calcium, Ion: 1.16 mmol/L (ref 1.12–1.23)
HCT: 37 % — ABNORMAL LOW (ref 39.0–52.0)
HCT: 38 % — ABNORMAL LOW (ref 39.0–52.0)
Hemoglobin: 12.6 g/dL — ABNORMAL LOW (ref 13.0–17.0)
Hemoglobin: 12.9 g/dL — ABNORMAL LOW (ref 13.0–17.0)
O2 Saturation: 99 %
O2 Saturation: 93 %
Patient temperature: 35.3
Patient temperature: 35.2
Potassium: 4 mmol/L (ref 3.5–5.1)
Potassium: 4.7 mmol/L (ref 3.5–5.1)
Sodium: 134 mmol/L — ABNORMAL LOW (ref 135–145)
Sodium: 131 mmol/L — ABNORMAL LOW (ref 135–145)
TCO2: 22 mmol/L (ref 0–100)
TCO2: 23 mmol/L (ref 0–100)
pCO2 arterial: 54 mmHg — ABNORMAL HIGH (ref 35.0–45.0)
pCO2 arterial: 50.9 mmHg — ABNORMAL HIGH (ref 35.0–45.0)
pH, Arterial: 7.165 — CL (ref 7.350–7.450)
pH, Arterial: 7.22 — ABNORMAL LOW (ref 7.350–7.450)
pO2, Arterial: 146 mmHg — ABNORMAL HIGH (ref 80.0–100.0)
pO2, Arterial: 73 mmHg — ABNORMAL LOW (ref 80.0–100.0)

## 2015-01-14 LAB — TYPE AND SCREEN
ABO/RH(D): A POS
Antibody Screen: NEGATIVE

## 2015-01-14 LAB — COMPREHENSIVE METABOLIC PANEL
ALT: 25 U/L (ref 17–63)
AST: 27 U/L (ref 15–41)
Albumin: 2.9 g/dL — ABNORMAL LOW (ref 3.5–5.0)
Alkaline Phosphatase: 68 U/L (ref 38–126)
Anion gap: 10 (ref 5–15)
BUN: 14 mg/dL (ref 6–20)
CO2: 16 mmol/L — ABNORMAL LOW (ref 22–32)
Calcium: 8.4 mg/dL — ABNORMAL LOW (ref 8.9–10.3)
Chloride: 107 mmol/L (ref 101–111)
Creatinine, Ser: 0.58 mg/dL — ABNORMAL LOW (ref 0.61–1.24)
GFR calc Af Amer: >60 mL/min (ref >60)
GFR calc non Af Amer: >60 mL/min (ref >60)
Glucose, Bld: 97 mg/dL (ref 65–99)
Potassium: 4.7 mmol/L (ref 3.5–5.1)
Sodium: 133 mmol/L — ABNORMAL LOW (ref 135–145)
Total Bilirubin: 0.5 mg/dL (ref 0.3–1.2)
Total Protein: 5.8 g/dL — ABNORMAL LOW (ref 6.5–8.1)

## 2015-01-14 LAB — ABO/RH: ABO/RH(D): A POS

## 2015-01-14 LAB — PROTIME-INR
INR: 0.94 (ref 0.00–1.49)
Prothrombin Time: 12.8 seconds (ref 11.6–15.2)

## 2015-01-14 LAB — CBC
HCT: 42.5 % (ref 39.0–52.0)
Hemoglobin: 14.6 g/dL (ref 13.0–17.0)
MCH: 33.3 pg (ref 26.0–34.0)
MCHC: 34.4 g/dL (ref 30.0–36.0)
MCV: 96.8 fL (ref 78.0–100.0)
Platelets: 207 10*3/uL (ref 150–400)
RBC: 4.39 MIL/uL (ref 4.22–5.81)
RDW: 13.9 % (ref 11.5–15.5)
WBC: 6.1 10*3/uL (ref 4.0–10.5)

## 2015-01-14 LAB — BLOOD GAS, ARTERIAL
Acid-base deficit: 6 mmol/L — ABNORMAL HIGH (ref 0.0–2.0)
Bicarbonate: 17.8 mEq/L — ABNORMAL LOW (ref 20.0–24.0)
Drawn by: 206361
FIO2: 0.21
O2 Saturation: 96.3 %
Patient temperature: 98.6
TCO2: 18.7 mmol/L (ref 0–100)
pCO2 arterial: 28.9 mmHg — ABNORMAL LOW (ref 35.0–45.0)
pH, Arterial: 7.407 (ref 7.350–7.450)
pO2, Arterial: 89.1 mmHg (ref 80.0–100.0)

## 2015-01-14 LAB — POCT ACTIVATED CLOTTING TIME: Activated Clotting Time: 0 seconds

## 2015-01-14 LAB — SURGICAL PCR SCREEN
MRSA, PCR: NEGATIVE
Staphylococcus aureus: POSITIVE — AB

## 2015-01-14 LAB — APTT: aPTT: 27 seconds (ref 24–37)

## 2015-01-14 SURGERY — BRONCHOSCOPY, VIDEO-ASSISTED
Anesthesia: General | Site: Chest

## 2015-01-14 MED ORDER — ALBUMIN HUMAN 5 % IV SOLN
INTRAVENOUS | Status: DC | PRN
  Administered 2015-01-14: 13:00:00 via INTRAVENOUS

## 2015-01-14 MED ORDER — SODIUM CHLORIDE 0.9 % IJ SOLN
INTRAMUSCULAR | Status: AC
  Filled 2015-01-14: qty 10

## 2015-01-14 MED ORDER — HEMOSTATIC AGENTS (NO CHARGE) OPTIME
TOPICAL | Status: DC | PRN
  Administered 2015-01-14: 1 via TOPICAL

## 2015-01-14 MED ORDER — SUGAMMADEX SODIUM 200 MG/2ML IV SOLN
INTRAVENOUS | Status: DC | PRN
  Administered 2015-01-14: 200 mg via INTRAVENOUS

## 2015-01-14 MED ORDER — NALOXONE HCL 0.4 MG/ML IJ SOLN: 0.4000 mg | INTRAMUSCULAR | Status: DC | PRN

## 2015-01-14 MED ORDER — ONDANSETRON HCL 4 MG/2ML IJ SOLN
INTRAMUSCULAR | Status: DC | PRN
  Administered 2015-01-14: 4 mg via INTRAVENOUS

## 2015-01-14 MED ORDER — SUCCINYLCHOLINE CHLORIDE 20 MG/ML IJ SOLN
INTRAMUSCULAR | Status: DC | PRN
  Administered 2015-01-14: 80 mg via INTRAVENOUS

## 2015-01-14 MED ORDER — DEXTROSE 5 % IV SOLN
INTRAVENOUS | Status: AC
  Administered 2015-01-14: 1.5 g via INTRAVENOUS
  Filled 2015-01-14: qty 1.5

## 2015-01-14 MED ORDER — DIPHENHYDRAMINE HCL 12.5 MG/5ML PO ELIX: 12.5000 mg | ORAL_SOLUTION | Freq: Four times a day (QID) | ORAL | Status: DC | PRN

## 2015-01-14 MED ORDER — SENNOSIDES-DOCUSATE SODIUM 8.6-50 MG PO TABS
1.0000 | ORAL_TABLET | Freq: Every day | ORAL | Status: DC
  Administered 2015-01-14 – 2015-01-16 (×3): 1 via ORAL
  Filled 2015-01-14 (×3): qty 1

## 2015-01-14 MED ORDER — DEXMEDETOMIDINE HCL IN NACL 200 MCG/50ML IV SOLN
INTRAVENOUS | Status: AC
  Filled 2015-01-14: qty 50

## 2015-01-14 MED ORDER — ONDANSETRON HCL 4 MG/2ML IJ SOLN: 4.0000 mg | Freq: Four times a day (QID) | INTRAMUSCULAR | Status: DC | PRN

## 2015-01-14 MED ORDER — DIPHENHYDRAMINE HCL 50 MG/ML IJ SOLN
12.5000 mg | Freq: Four times a day (QID) | INTRAMUSCULAR | Status: DC | PRN
  Administered 2015-01-15: 12.5 mg via INTRAVENOUS
  Filled 2015-01-14: qty 1

## 2015-01-14 MED ORDER — ROCURONIUM BROMIDE 100 MG/10ML IV SOLN
INTRAVENOUS | Status: DC | PRN
  Administered 2015-01-14: 20 mg via INTRAVENOUS
  Administered 2015-01-14 (×2): 10 mg via INTRAVENOUS
  Administered 2015-01-14: 30 mg via INTRAVENOUS
  Administered 2015-01-14: 10 mg via INTRAVENOUS
  Administered 2015-01-14: 5 mg via INTRAVENOUS
  Administered 2015-01-14: 20 mg via INTRAVENOUS
  Administered 2015-01-14: 60 mg via INTRAVENOUS
  Administered 2015-01-14: 10 mg via INTRAVENOUS
  Administered 2015-01-14: 20 mg via INTRAVENOUS
  Administered 2015-01-14: 50 mg via INTRAVENOUS
  Administered 2015-01-14: 25 mg via INTRAVENOUS

## 2015-01-14 MED ORDER — FENTANYL 40 MCG/ML IV SOLN
INTRAVENOUS | Status: DC
  Administered 2015-01-14: 165 ug via INTRAVENOUS
  Administered 2015-01-14: 15:00:00 via INTRAVENOUS
  Administered 2015-01-15: 30 ug via INTRAVENOUS
  Administered 2015-01-15: 105 ug via INTRAVENOUS
  Administered 2015-01-15: 90 ug via INTRAVENOUS
  Administered 2015-01-15: 105 ug via INTRAVENOUS
  Administered 2015-01-15: 45 ug via INTRAVENOUS
  Administered 2015-01-15: 180 ug via INTRAVENOUS
  Administered 2015-01-16: 15 ug via INTRAVENOUS
  Administered 2015-01-16: 30 ug via INTRAVENOUS

## 2015-01-14 MED ORDER — FENTANYL 40 MCG/ML IV SOLN
INTRAVENOUS | Status: AC
  Filled 2015-01-14: qty 25

## 2015-01-14 MED ORDER — HYDROMORPHONE HCL 1 MG/ML IJ SOLN
INTRAMUSCULAR | Status: DC | PRN
  Administered 2015-01-14: .3 mg via INTRAVENOUS
  Administered 2015-01-14: 0.5 mg via INTRAVENOUS
  Administered 2015-01-14: .2 mg via INTRAVENOUS

## 2015-01-14 MED ORDER — CLONAZEPAM 0.5 MG PO TABS
0.5000 mg | ORAL_TABLET | Freq: Three times a day (TID) | ORAL | Status: DC | PRN
  Administered 2015-01-15 – 2015-01-18 (×4): 0.5 mg via ORAL
  Filled 2015-01-14 (×4): qty 1

## 2015-01-14 MED ORDER — ROCURONIUM BROMIDE 50 MG/5ML IV SOLN
INTRAVENOUS | Status: AC
  Filled 2015-01-14: qty 3

## 2015-01-14 MED ORDER — FENTANYL CITRATE (PF) 250 MCG/5ML IJ SOLN
INTRAMUSCULAR | Status: AC
  Filled 2015-01-14: qty 5

## 2015-01-14 MED ORDER — NEOSTIGMINE METHYLSULFATE 10 MG/10ML IV SOLN
INTRAVENOUS | Status: AC
  Filled 2015-01-14: qty 5

## 2015-01-14 MED ORDER — MUPIROCIN 2 % EX OINT
1.0000 "application " | TOPICAL_OINTMENT | Freq: Once | CUTANEOUS | Status: AC
  Administered 2015-01-14: 1 via TOPICAL

## 2015-01-14 MED ORDER — TRAMADOL HCL 50 MG PO TABS: 50.0000 mg | ORAL_TABLET | Freq: Four times a day (QID) | ORAL | Status: DC | PRN

## 2015-01-14 MED ORDER — ARTIFICIAL TEARS OP OINT
TOPICAL_OINTMENT | OPHTHALMIC | Status: AC
  Filled 2015-01-14: qty 3.5

## 2015-01-14 MED ORDER — NICOTINE 14 MG/24HR TD PT24
14.0000 mg | MEDICATED_PATCH | Freq: Every day | TRANSDERMAL | Status: DC
  Administered 2015-01-14 – 2015-01-17 (×4): 14 mg via TRANSDERMAL
  Filled 2015-01-14 (×4): qty 1

## 2015-01-14 MED ORDER — BUPIVACAINE HCL (PF) 0.5 % IJ SOLN
INTRAMUSCULAR | Status: DC | PRN
  Administered 2015-01-14: 10 mL

## 2015-01-14 MED ORDER — DEXMEDETOMIDINE HCL 200 MCG/2ML IV SOLN
INTRAVENOUS | Status: DC | PRN
  Administered 2015-01-14 (×3): 8 ug via INTRAVENOUS

## 2015-01-14 MED ORDER — POTASSIUM CHLORIDE 10 MEQ/50ML IV SOLN: 10.0000 meq | Freq: Every day | INTRAVENOUS | Status: DC | PRN

## 2015-01-14 MED ORDER — DEXTROSE-NACL 5-0.9 % IV SOLN
INTRAVENOUS | Status: DC
  Administered 2015-01-14 – 2015-01-15 (×2): 125 mL/h via INTRAVENOUS

## 2015-01-14 MED ORDER — HYDROMORPHONE HCL 1 MG/ML IJ SOLN
0.2500 mg | INTRAMUSCULAR | Status: DC | PRN
  Administered 2015-01-14 (×2): 0.5 mg via INTRAVENOUS

## 2015-01-14 MED ORDER — LIDOCAINE HCL (CARDIAC) 20 MG/ML IV SOLN
INTRAVENOUS | Status: DC | PRN
  Administered 2015-01-14: 80 mg via INTRAVENOUS

## 2015-01-14 MED ORDER — PHENYLEPHRINE HCL 10 MG/ML IJ SOLN
INTRAMUSCULAR | Status: DC | PRN
  Administered 2015-01-14 (×2): 80 ug via INTRAVENOUS

## 2015-01-14 MED ORDER — SODIUM CHLORIDE 0.9 % IJ SOLN: 9.0000 mL | INTRAMUSCULAR | Status: DC | PRN

## 2015-01-14 MED ORDER — ROCURONIUM BROMIDE 50 MG/5ML IV SOLN
INTRAVENOUS | Status: AC
  Filled 2015-01-14: qty 1

## 2015-01-14 MED ORDER — ALBUTEROL SULFATE (2.5 MG/3ML) 0.083% IN NEBU
2.5000 mg | INHALATION_SOLUTION | RESPIRATORY_TRACT | Status: DC
  Administered 2015-01-14: 2.5 mg via RESPIRATORY_TRACT
  Filled 2015-01-14: qty 3

## 2015-01-14 MED ORDER — ROCURONIUM BROMIDE 50 MG/5ML IV SOLN
INTRAVENOUS | Status: AC
Start: 2015-01-14 — End: 2015-01-14
  Filled 2015-01-14: qty 2

## 2015-01-14 MED ORDER — OXYCODONE HCL 5 MG/5ML PO SOLN: 5.0000 mg | Freq: Once | ORAL | Status: DC | PRN

## 2015-01-14 MED ORDER — MIDAZOLAM HCL 5 MG/5ML IJ SOLN
INTRAMUSCULAR | Status: DC | PRN
  Administered 2015-01-14: 2 mg via INTRAVENOUS

## 2015-01-14 MED ORDER — ALBUTEROL SULFATE (2.5 MG/3ML) 0.083% IN NEBU
2.5000 mg | INHALATION_SOLUTION | Freq: Four times a day (QID) | RESPIRATORY_TRACT | Status: DC
  Administered 2015-01-15: 2.5 mg via RESPIRATORY_TRACT
  Filled 2015-01-14: qty 3

## 2015-01-14 MED ORDER — EPHEDRINE SULFATE 50 MG/ML IJ SOLN
INTRAMUSCULAR | Status: AC
  Filled 2015-01-14: qty 1

## 2015-01-14 MED ORDER — PROPOFOL 10 MG/ML IV BOLUS
INTRAVENOUS | Status: DC | PRN
  Administered 2015-01-14: 50 mg via INTRAVENOUS
  Administered 2015-01-14: 100 mg via INTRAVENOUS

## 2015-01-14 MED ORDER — SUCCINYLCHOLINE CHLORIDE 20 MG/ML IJ SOLN
INTRAMUSCULAR | Status: AC
  Filled 2015-01-14: qty 1

## 2015-01-14 MED ORDER — ACETAMINOPHEN 160 MG/5ML PO SOLN
1000.0000 mg | Freq: Four times a day (QID) | ORAL | Status: DC
  Administered 2015-01-16: 1000 mg via ORAL
  Filled 2015-01-14: qty 40.6

## 2015-01-14 MED ORDER — HYDROMORPHONE HCL 1 MG/ML IJ SOLN
INTRAMUSCULAR | Status: AC
  Filled 2015-01-14: qty 1

## 2015-01-14 MED ORDER — FENTANYL CITRATE (PF) 100 MCG/2ML IJ SOLN
INTRAMUSCULAR | Status: DC | PRN
  Administered 2015-01-14: 250 ug via INTRAVENOUS
  Administered 2015-01-14 (×3): 100 ug via INTRAVENOUS
  Administered 2015-01-14: 50 ug via INTRAVENOUS
  Administered 2015-01-14: 100 ug via INTRAVENOUS
  Administered 2015-01-14: 150 ug via INTRAVENOUS
  Administered 2015-01-14: 250 ug via INTRAVENOUS
  Administered 2015-01-14: 50 ug via INTRAVENOUS
  Administered 2015-01-14: 100 ug via INTRAVENOUS

## 2015-01-14 MED ORDER — MUPIROCIN 2 % EX OINT
TOPICAL_OINTMENT | CUTANEOUS | Status: AC
  Filled 2015-01-14: qty 22

## 2015-01-14 MED ORDER — PROMETHAZINE HCL 25 MG/ML IJ SOLN: 6.2500 mg | INTRAMUSCULAR | Status: DC | PRN

## 2015-01-14 MED ORDER — ACETAMINOPHEN 500 MG PO TABS
1000.0000 mg | ORAL_TABLET | Freq: Four times a day (QID) | ORAL | Status: DC
  Administered 2015-01-14 – 2015-01-18 (×10): 1000 mg via ORAL
  Filled 2015-01-14 (×10): qty 2

## 2015-01-14 MED ORDER — OXYCODONE HCL 5 MG PO TABS: 5.0000 mg | ORAL_TABLET | Freq: Once | ORAL | Status: DC | PRN

## 2015-01-14 MED ORDER — MIDAZOLAM HCL 2 MG/2ML IJ SOLN
INTRAMUSCULAR | Status: AC
  Filled 2015-01-14: qty 2

## 2015-01-14 MED ORDER — BUPIVACAINE 0.5 % ON-Q PUMP SINGLE CATH 400 ML
400.0000 mL | INJECTION | Status: AC
  Filled 2015-01-14: qty 400

## 2015-01-14 MED ORDER — ONDANSETRON HCL 4 MG/2ML IJ SOLN
INTRAMUSCULAR | Status: AC
  Filled 2015-01-14: qty 2

## 2015-01-14 MED ORDER — DEXTROSE 5 % IV SOLN
1.5000 g | Freq: Two times a day (BID) | INTRAVENOUS | Status: AC
  Administered 2015-01-14 – 2015-01-15 (×2): 1.5 g via INTRAVENOUS
  Filled 2015-01-14 (×2): qty 1.5

## 2015-01-14 MED ORDER — SUGAMMADEX SODIUM 200 MG/2ML IV SOLN
INTRAVENOUS | Status: AC
  Filled 2015-01-14: qty 2

## 2015-01-14 MED ORDER — BUPIVACAINE HCL (PF) 0.5 % IJ SOLN
INTRAMUSCULAR | Status: AC
Start: 2015-01-14 — End: 2015-01-14
  Filled 2015-01-14: qty 10

## 2015-01-14 MED ORDER — 0.9 % SODIUM CHLORIDE (POUR BTL) OPTIME
TOPICAL | Status: DC | PRN
Start: 2015-01-14 — End: 2015-01-14
  Administered 2015-01-14: 1000 mL

## 2015-01-14 MED ORDER — LEVETIRACETAM 750 MG PO TABS
750.0000 mg | ORAL_TABLET | Freq: Two times a day (BID) | ORAL | Status: DC
  Administered 2015-01-15 – 2015-01-18 (×6): 750 mg via ORAL
  Filled 2015-01-14: qty 3
  Filled 2015-01-14 (×3): qty 1
  Filled 2015-01-14: qty 3
  Filled 2015-01-14 (×2): qty 1
  Filled 2015-01-14: qty 3
  Filled 2015-01-14: qty 1

## 2015-01-14 MED ORDER — HYDROMORPHONE HCL 1 MG/ML IJ SOLN
INTRAMUSCULAR | Status: AC
  Administered 2015-01-14: 0.5 mg via INTRAVENOUS
  Filled 2015-01-14: qty 1

## 2015-01-14 MED ORDER — OXYCODONE HCL 5 MG PO TABS
5.0000 mg | ORAL_TABLET | ORAL | Status: DC | PRN
  Administered 2015-01-14 – 2015-01-16 (×2): 10 mg via ORAL
  Administered 2015-01-16: 5 mg via ORAL
  Administered 2015-01-17 – 2015-01-18 (×3): 10 mg via ORAL
  Filled 2015-01-14 (×2): qty 2
  Filled 2015-01-14: qty 1
  Filled 2015-01-14 (×2): qty 2

## 2015-01-14 MED ORDER — BUPIVACAINE 0.5 % ON-Q PUMP SINGLE CATH 400 ML: 400.0000 mL | INJECTION | Status: DC

## 2015-01-14 MED ORDER — PROPOFOL 10 MG/ML IV BOLUS
INTRAVENOUS | Status: AC
  Filled 2015-01-14: qty 20

## 2015-01-14 MED ORDER — BUPIVACAINE 0.5 % ON-Q PUMP SINGLE CATH 400 ML
400.0000 mL | INJECTION | Status: DC
  Administered 2015-01-14: 400 mL
  Filled 2015-01-14: qty 400

## 2015-01-14 MED ORDER — LACTATED RINGERS IV SOLN
INTRAVENOUS | Status: DC | PRN
  Administered 2015-01-14 (×3): via INTRAVENOUS

## 2015-01-14 MED ORDER — DEXTROSE 5 % IV SOLN
10.0000 mg | INTRAVENOUS | Status: DC | PRN
  Administered 2015-01-14: 15 ug/min via INTRAVENOUS

## 2015-01-14 MED ORDER — OXYCODONE HCL 5 MG PO TABS
ORAL_TABLET | ORAL | Status: AC
  Administered 2015-01-14: 10 mg via ORAL
  Filled 2015-01-14: qty 2

## 2015-01-14 MED ORDER — BISACODYL 5 MG PO TBEC
10.0000 mg | DELAYED_RELEASE_TABLET | Freq: Every day | ORAL | Status: DC
  Administered 2015-01-14 – 2015-01-16 (×3): 10 mg via ORAL
  Filled 2015-01-14 (×3): qty 2

## 2015-01-14 MED ORDER — LIDOCAINE HCL (CARDIAC) 20 MG/ML IV SOLN
INTRAVENOUS | Status: AC
  Filled 2015-01-14: qty 5

## 2015-01-14 SURGICAL SUPPLY — 100 items
ADH SKN CLS APL DERMABOND .7 (GAUZE/BANDAGES/DRESSINGS) ×2
APL SRG 22X2 LUM MLBL SLNT (VASCULAR PRODUCTS)
APL SRG 7X2 LUM MLBL SLNT (VASCULAR PRODUCTS)
APPLICATOR TIP COSEAL (VASCULAR PRODUCTS) IMPLANT
APPLICATOR TIP EXT COSEAL (VASCULAR PRODUCTS) IMPLANT
BAG SPEC RTRVL LRG 6X4 10 (ENDOMECHANICALS) ×2
BLADE SURG 11 STRL SS (BLADE) ×1 IMPLANT
BRUSH CYTOL CELLEBRITY 1.5X140 (MISCELLANEOUS) IMPLANT
CANISTER SUCTION 2500CC (MISCELLANEOUS) ×3 IMPLANT
CATH KIT ON Q 5IN SLV (PAIN MANAGEMENT) IMPLANT
CATH KIT ON-Q SILVERSOAK 5 (CATHETERS) IMPLANT
CATH KIT ON-Q SILVERSOAK 5IN (CATHETERS) ×3 IMPLANT
CATH THORACIC 28FR (CATHETERS) ×1 IMPLANT
CATH THORACIC 36FR (CATHETERS) IMPLANT
CATH THORACIC 36FR RT ANG (CATHETERS) IMPLANT
CLIP TI MEDIUM 6 (CLIP) ×3 IMPLANT
CONN ST 1/4X3/8  BEN (MISCELLANEOUS) ×1
CONN ST 1/4X3/8 BEN (MISCELLANEOUS) IMPLANT
CONT SPEC 4OZ CLIKSEAL STRL BL (MISCELLANEOUS) ×10 IMPLANT
COVER TABLE BACK 60X90 (DRAPES) ×3 IMPLANT
DERMABOND ADVANCED (GAUZE/BANDAGES/DRESSINGS) ×1
DERMABOND ADVANCED .7 DNX12 (GAUZE/BANDAGES/DRESSINGS) IMPLANT
DRAIN CHANNEL 28F RND 3/8 FF (WOUND CARE) ×1 IMPLANT
DRAIN CHANNEL 32F RND 10.7 FF (WOUND CARE) IMPLANT
DRAPE LAPAROSCOPIC ABDOMINAL (DRAPES) ×3 IMPLANT
DRAPE WARM FLUID 44X44 (DRAPE) ×3 IMPLANT
DRILL BIT 7/64X5 (BIT) ×1 IMPLANT
ELECT BLADE 4.0 EZ CLEAN MEGAD (MISCELLANEOUS) ×3
ELECT BLADE 6.5 EXT (BLADE) ×1 IMPLANT
ELECT REM PT RETURN 9FT ADLT (ELECTROSURGICAL) ×3
ELECTRODE BLDE 4.0 EZ CLN MEGD (MISCELLANEOUS) ×2 IMPLANT
ELECTRODE REM PT RTRN 9FT ADLT (ELECTROSURGICAL) ×2 IMPLANT
FORCEPS BIOP RJ4 1.8 (CUTTING FORCEPS) IMPLANT
GAUZE SPONGE 4X4 12PLY STRL (GAUZE/BANDAGES/DRESSINGS) ×3 IMPLANT
GLOVE BIO SURGEON STRL SZ 6.5 (GLOVE) ×7 IMPLANT
GLOVE BIO SURGEON STRL SZ7 (GLOVE) ×4 IMPLANT
GLOVE BIO SURGEON STRL SZ7.5 (GLOVE) ×1 IMPLANT
GOWN STRL REUS W/ TWL LRG LVL3 (GOWN DISPOSABLE) ×8 IMPLANT
GOWN STRL REUS W/TWL LRG LVL3 (GOWN DISPOSABLE) ×12
KIT BASIN OR (CUSTOM PROCEDURE TRAY) ×3 IMPLANT
KIT CLEAN ENDO COMPLIANCE (KITS) ×3 IMPLANT
KIT ROOM TURNOVER OR (KITS) ×3 IMPLANT
KIT SUCTION CATH 14FR (SUCTIONS) ×3 IMPLANT
MARKER SKIN DUAL TIP RULER LAB (MISCELLANEOUS) ×3 IMPLANT
NDL BIOPSY TRANSBRONCH 21G (NEEDLE) IMPLANT
NEEDLE BIOPSY TRANSBRONCH 21G (NEEDLE) IMPLANT
NS IRRIG 1000ML POUR BTL (IV SOLUTION) ×12 IMPLANT
OIL SILICONE PENTAX (PARTS (SERVICE/REPAIRS)) ×3 IMPLANT
PACK CHEST (CUSTOM PROCEDURE TRAY) ×3 IMPLANT
PAD ARMBOARD 7.5X6 YLW CONV (MISCELLANEOUS) ×9 IMPLANT
PASSER SUT SWANSON 36MM LOOP (INSTRUMENTS) ×1 IMPLANT
POUCH SPECIMEN RETRIEVAL 10MM (ENDOMECHANICALS) ×1 IMPLANT
RELOAD GOLD ECHELON 45 (STAPLE) ×9 IMPLANT
RELOAD GREEN ECHELON 45 (STAPLE) ×2 IMPLANT
RELOAD STAPLE 35X2.5 WHT THIN (STAPLE) IMPLANT
SCISSORS LAP 5X35 DISP (ENDOMECHANICALS) ×1 IMPLANT
SEALANT PROGEL (MISCELLANEOUS) ×1 IMPLANT
SEALANT SURG COSEAL 4ML (VASCULAR PRODUCTS) IMPLANT
SEALANT SURG COSEAL 8ML (VASCULAR PRODUCTS) IMPLANT
SOLUTION ANTI FOG 6CC (MISCELLANEOUS) ×3 IMPLANT
SPONGE GAUZE 4X4 12PLY STER LF (GAUZE/BANDAGES/DRESSINGS) ×1 IMPLANT
SPONGE INTESTINAL PEANUT (DISPOSABLE) ×3 IMPLANT
STAPLE RELOAD 2.5MM WHITE (STAPLE) ×3 IMPLANT
STAPLER ECHELON POWERED (MISCELLANEOUS) ×1 IMPLANT
STAPLER VASCULAR ECHELON 35 (CUTTER) ×1 IMPLANT
SUT PROLENE 3 0 SH DA (SUTURE) IMPLANT
SUT PROLENE 4 0 RB 1 (SUTURE)
SUT PROLENE 4-0 RB1 .5 CRCL 36 (SUTURE) IMPLANT
SUT SILK  1 MH (SUTURE) ×8
SUT SILK 1 MH (SUTURE) ×8 IMPLANT
SUT SILK 1 TIES 10X30 (SUTURE) IMPLANT
SUT SILK 2 0 SH (SUTURE) IMPLANT
SUT SILK 2 0SH CR/8 30 (SUTURE) IMPLANT
SUT SILK 3 0 SH CR/8 (SUTURE) ×1 IMPLANT
SUT SILK 3 0SH CR/8 30 (SUTURE) IMPLANT
SUT STEEL 1 (SUTURE) IMPLANT
SUT VIC AB 1 CTX 18 (SUTURE) ×1 IMPLANT
SUT VIC AB 1 CTX 36 (SUTURE)
SUT VIC AB 1 CTX36XBRD ANBCTR (SUTURE) IMPLANT
SUT VIC AB 2-0 CTX 36 (SUTURE) IMPLANT
SUT VIC AB 2-0 UR6 27 (SUTURE) IMPLANT
SUT VIC AB 3-0 SH 8-18 (SUTURE) ×1 IMPLANT
SUT VIC AB 3-0 X1 27 (SUTURE) IMPLANT
SUT VICRYL 0 UR6 27IN ABS (SUTURE) IMPLANT
SUT VICRYL 2 TP 1 (SUTURE) ×1 IMPLANT
SWAB COLLECTION DEVICE MRSA (MISCELLANEOUS) IMPLANT
SYR 20ML ECCENTRIC (SYRINGE) ×3 IMPLANT
SYSTEM SAHARA CHEST DRAIN ATS (WOUND CARE) ×3 IMPLANT
TAPE CLOTH SURG 4X10 WHT LF (GAUZE/BANDAGES/DRESSINGS) ×1 IMPLANT
TAPE UMBILICAL COTTON 1/8X30 (MISCELLANEOUS) ×3 IMPLANT
TIP APPLICATOR SPRAY EXTEND 16 (VASCULAR PRODUCTS) ×1 IMPLANT
TOWEL OR 17X24 6PK STRL BLUE (TOWEL DISPOSABLE) ×6 IMPLANT
TOWEL OR 17X26 10 PK STRL BLUE (TOWEL DISPOSABLE) ×6 IMPLANT
TRAP SPECIMEN MUCOUS 40CC (MISCELLANEOUS) ×3 IMPLANT
TRAY FOLEY CATH 16FRSI W/METER (SET/KITS/TRAYS/PACK) ×3 IMPLANT
TROCAR XCEL BLUNT TIP 100MML (ENDOMECHANICALS) IMPLANT
TUBE ANAEROBIC SPECIMEN COL (MISCELLANEOUS) IMPLANT
TUBE CONNECTING 20X1/4 (TUBING) ×6 IMPLANT
TUNNELER SHEATH ON-Q 11GX8 DSP (PAIN MANAGEMENT) ×1 IMPLANT
WATER STERILE IRR 1000ML POUR (IV SOLUTION) ×6 IMPLANT

## 2015-01-14 NOTE — H&P (Signed)
[name]       ,[place] [address]             [phone]                    Timothy Obrien Medical Record #[id number] Date of Birth: [birth year]  Referring: Dr Julien Nordmann Primary Care: Abigail Miyamoto, MD  Chief Complaint:   lung mass   History of Present Illness:    Timothy Obrien 55 y.o. male is seen in the office  today for evaluation of possible resection of left lower lobe superior segment. He present last week to ER with new onset of seizure. 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 He under went radiation rx to brain and resection 6 days ago. Patient is long term and current smoker.   He is recovering quickly from recent brain surgery. We discussed the need and techniques to quit smoking  Current Activity/ Functional Status:  Patient is independent with mobility/ambulation, transfers, ADL's, IADL's.   Zubrod Score: At the time of surgery this patient's most appropriate activity status/level should be described as: _0     0    Normal activity, no symptoms _1     1    Restricted in physical strenuous activity but ambulatory, able to do out light work _2     2    Ambulatory and capable of self care, unable to do work activities, up and about               >50 % of waking hours                              _3     3    Only limited self care, in bed greater than 50% of waking hours _4     4    Completely disabled, no self care, confined to bed or chair _5     5    Moribund   Past Medical History  Diagnosis Date  . 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 Pneumonia   . Bronchitis   . Tobacco abuse   . Seizures ([facility])     last 12/11/14  . Shortness of breath dyspnea     with exertion    Past Surgical History  Procedure Laterality Date  . Hernia repair    . Vasectomy    . Video bronchoscopy Bilateral 12/16/2014    Procedure: VIDEO BRONCHOSCOPY WITH FLUORO;   Surgeon: Collene Gobble, MD;  Location: [facility];  Service: Cardiopulmonary;  Laterality: Bilateral;  . Craniotomy N/A 12/26/2014    Procedure: CRANIOTOMY TUMOR EXCISION with BrainLab;  Surgeon: Kevan Ny Ditty, MD;  Location: [facility];  Service: Neurosurgery;  Laterality: N/A;  CRANIOTOMY TUMOR EXCISION with [facility]  . Application of cranial navigation N/A 12/26/2014    Procedure: APPLICATION OF CRANIAL NAVIGATION;  Surgeon: Kevan Ny Ditty, MD;  Location: [facility];  Service: Neurosurgery;  Laterality: N/A;    Family History  Problem Relation Age of Onset  . Breast cancer Mother   . Arthritis Father   . Colon polyps Mother     Social History   Social History  . Marital Status: Divorced    Spouse Name: N/A  . Number of Children: N/A  . Years of Education:  N/A   Occupational History  . Not on file.   Social History Main Topics  . Smoking status: Current Every Day Smoker -- 1.50 packs/day for 36 years    Types: Cigarettes    Start date: 12/14/1979  . Smokeless tobacco: Never Used  . Alcohol Use: 0.0 oz/week    0 Standard drinks or equivalent per week     Comment: daily 6 beers a day for the past fefw years.   . Drug Use: Yes    Special: Marijuana  . Sexual Activity: Not Currently   Other Topics Concern  . 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 He does have a cat but no other home pets. No mold exposure. Recent travel to [place] but with symptoms at the onset of travel.    History  Smoking status  . Current Every Day Smoker -- 1.00 packs/day for 36 years  . Types: Cigarettes  . Start date: 12/14/1979  Smokeless tobacco  . Never Used    History  Alcohol Use  . 21.0 oz/week  . 0 Standard drinks or equivalent, 35 Cans of beer per week    Comment: daily 6 beers a day for the past fefw years.      No Known Allergies  Current Facility-Administered Medications    Medication Dose Route Frequency Provider Last Rate Last Dose  . dextrose 5 % with cefUROXime (ZINACEF) ADS Med           . mupirocin ointment (BACTROBAN) 2 %               Review of Systems:     Cardiac Review of Systems: Y or N  Chest Pain [  N  ]  Resting SOB [ N  ] Exertional SOB  [ Y ]  Orthopnea [  N]   Pedal Edema [   ]    Palpitations [  ] Syncope  [  ]   Presyncope [   ]  General Review of Systems: [Y] = yes [  ]=no Constitional: recent weight change Aqua.Slicker ];  Wt loss over the last 3 months [   ] anorexia [  ]; fatigue [  ]; nausea [  ]; night sweats [  ]; fever [  ]; or chills [  ];          Dental: poor dentition[  ]; Last Dentist visit:   Eye : blurred vision [  ]; diplopia [   ]; vision changes [  ];  Amaurosis fugax[  ]; Resp: cough [  ];  wheezing[  ];  hemoptysis[  ]; shortness of breath[  ]; paroxysmal nocturnal dyspnea[  ]; dyspnea on exertion[  ]; or orthopnea[  ];  GI:  gallstones[  ], vomiting[  ];  dysphagia[  ]; melena[  ];  hematochezia [  ]; heartburn[  ];   Hx of  Colonoscopy[  ]; GU: kidney stones [  ]; hematuria[  ];   dysuria [  ];  nocturia[  ];  history of     obstruction [  ]; urinary frequency [  ]             Skin: rash, swelling[  ];, hair loss[  ];  peripheral edema[  ];  or itching[  ]; Musculosketetal: myalgias[  ];  joint swelling[  ];  joint erythema[  ];  joint pain[  ];  back pain[  ];  Heme/Lymph: bruising[  ];  bleeding[  ];  anemia[  ];  Neuro: TIA[  ];  headaches[  ];  stroke[  ];  vertigo[  ];  seizures[  ];   paresthesias[  ];  difficulty walking[  ];  Psych:depression[  ]; anxiety[  ];  Endocrine: diabetes[  ];  thyroid dysfunction[  ];  Immunizations: Flu up to date [  ]; Pneumococcal up to date [  ];  Other:    PHYSICAL EXAMINATION: General appearance: alert, cooperative and appears stated age Head: Normocephalic, without obvious abnormality, healing scar left frontal area of scalp Neck: no adenopathy, no carotid bruit, no JVD, supple,  symmetrical, trachea midline and thyroid not enlarged, symmetric, no tenderness/mass/nodules Lymph nodes: Cervical, supraclavicular, and axillary nodes normal. Resp: clear to auscultation bilaterally Back: symmetric, no curvature. ROM normal. No CVA tenderness. Cardio: regular rate and rhythm, S1, S2 normal, no murmur, click, rub or gallop GI: soft, non-tender; bowel sounds normal; no masses,  no organomegaly Extremities: extremities normal, atraumatic, no cyanosis or edema Neurologic: Grossly normal  Diagnostic Studies & Laboratory data:     Recent Radiology Findings:   Dg Chest 2 View  01/14/2015  CLINICAL DATA:  Preoperative examination for patient with a lung mass. EXAM: CHEST  2 VIEW COMPARISON:  PET CT scan 12/24/2014. CT chest 12/13/2014 and single view of the chest 12/16/2014. FINDINGS: Mass in the superior segment of the left lower lobe is again identified. The lungs are otherwise clear. Heart size is normal. No pneumothorax or pleural effusion. Emphysematous change is noted. IMPRESSION: No acute disease. Mass in the superior segment of the left lower lobe as seen on prior exams. Emphysema. Electronically Signed   By: Inge Rise M.D.   On: 01/14/2015 07:22   Mr [name] ZO Contrast  12/27/2014  CLINICAL DATA:  Small cell lung cancer is status post radiosurgery and craniotomy. EXAM: MRI HEAD WITHOUT AND WITH CONTRAST TECHNIQUE: Multiplanar, multiecho pulse sequences of the brain and surrounding structures were obtained without and with intravenous contrast. CONTRAST:  70m MULTIHANCE GADOBENATE DIMEGLUMINE 529 MG/ML IV SOLN COMPARISON:  MRI brain 12/19/2014 FINDINGS: The left frontal lobe mass has been resected. Blood products are evident within the surgical cavity which measures 2.7 x 1.3 x 2.1 cm. Minimal surrounding vasogenic edema is evident without significant extension from the preoperative study. T2 changes extend into the subcortical white matter of the precentral gyrus. Mild  diffuse dural enhancement is within normal limits immediately following surgery. No new parenchymal enhancement is present. There is no evidence for acute infarct or hemorrhage otherwise. No significant extra-axial fluid collection is present. Pneumocephalus is noted. Flow is present in the major intracranial arteries. The globes and orbits are intact. A polyp mucous retention cyst is again noted in the right maxillary sinus. The remaining paranasal sinuses and the mastoid air cells are clear. IMPRESSION: 1. Interval resection of left frontal lobe peripherally enhancing cystic mass lesion. 2. Blood products are present within the resection cavity which measures 2.7 x 1.3 x 2.1 cm. 3. No acute infarct or hemorrhage otherwise. Minimal surrounding vasogenic edema is stable. Electronically Signed   By: CSan MorelleM.D.   On: 12/27/2014 07:05   Mr BKizzie FantasiaContrast  12/19/2014  CLINICAL DATA:  55year old male with brain metastases, probable lung primary. Stereotactic radiosurgery planning. Subsequent encounter. EXAM: MRI HEAD WITHOUT AND WITH CONTRAST TECHNIQUE: Multiplanar, multiecho pulse sequences of the brain and surrounding structures were obtained without and with intravenous contrast. CONTRAST:  9m MULTIHANCE GADOBENATE DIMEGLUMINE 529 MG/ML IV SOLN COMPARISON:  Brain MRI 12/12/2014. FINDINGS: 32 mm by 37 mm round mass at the superior left frontal gyrus with T1 isointense cystic internal contents and rim enhancement is stable. Minimal mineralization or hemosiderin in the wall of the lesion. Mild surrounding vasogenic edema considering the lesion size is stable. Mild regional mass effect. No other brain mass identified. Major intracranial vascular flow voids are stable. GPearline Cablesand white matter signal elsewhere are stable. No restricted diffusion or evidence of acute infarction. No ventriculomegaly. No acute intracranial hemorrhage identified. Negative pituitary, cervicomedullary junction and  visualized cervical spinal cord. Visible bone marrow signal is within normal limits. Visible internal auditory structures appear normal. Mastoids and paranasal sinuses are stable. Orbit and scalp soft tissues are stable. IMPRESSION: 1. Stable solitary left superior frontal gyrus cystic mass with rim enhancement measuring up to a 3.7 cm. Stable mild surrounding edema and regional mass effect. 2. No other brain mass identified.  No new intracranial abnormality. Electronically Signed   By: HGenevie AnnM.D.   On: 12/19/2014 19:12   Nm Pet Image Initial (pi) Skull Base To Thigh  12/24/2014  CLINICAL DATA:  Initial treatment strategy for Lung cancer. EXAM: NUCLEAR MEDICINE PET SKULL BASE TO THIGH TECHNIQUE: 11.6 mCi F-18 FDG was injected intravenously. Full-ring PET imaging was performed from the skull base to thigh after the radiotracer. CT data was obtained and used for attenuation correction and anatomic localization. FASTING BLOOD GLUCOSE:  Value: 106 mg/dl COMPARISON:  12/13/14 FINDINGS: NECK Area of photopenia involving the left frontal lobe is identified pars pontine to recently diagnosed brain metastases. No hypermetabolic lymph nodes within the soft tissues of the neck. CHEST Moderate changes of centrilobular emphysema. Mass within the superior segment of the left lower lobe is identified medially. This measures 3.7 cm and an SUV max equal to 9.9. Hypermetabolic left hilar lymph node has an SUV max equal to 4.5. ABDOMEN/PELVIS No abnormal hypermetabolic activity within the liver, pancreas, adrenal glands, or spleen. No hypermetabolic lymph nodes in the abdomen or pelvis. SKELETON No focal hypermetabolic activity to suggest skeletal metastasis. IMPRESSION: 1. Malignant range FDG uptake is associated with the mass located in the superior segment of left lower lobe. 2. Hypermetabolic left hilar lymph node compatible with metastatic adenopathy. 3. No evidence for metastasis to the abdomen, pelvis or visualized osseous  structures. Electronically Signed   By: TKerby MoorsM.D.   On: 12/24/2014 14:54   Dg Chest Port 1 View  12/16/2014  CLINICAL DATA:  Status post bronchoscopy EXAM: PORTABLE CHEST 1 VIEW COMPARISON:  12/13/2014 FINDINGS: Cardiomediastinal silhouette is stable. Persistent masslike consolidation left suprahilar posteromedially. No acute infiltrate or pulmonary edema. There is no pneumothorax. IMPRESSION: Persistent masslike consolidation left suprahilar posterior medially. No active disease. No pneumothorax. Electronically Signed   By: LLahoma CrockerM.D.   On: 12/16/2014 08:12   Dg C-arm Bronchoscopy  12/16/2014  CLINICAL DATA:  C-ARM BRONCHOSCOPY Fluoroscopy was utilized by the requesting physician.  No radiographic interpretation.     I have independently reviewed the above radiologic studies.  Recent Lab Findings: Lab Results  Component Value Date   WBC 6.1 01/14/2015   HGB 14.6 01/14/2015   HCT 42.5 01/14/2015   PLT 207 01/14/2015   GLUCOSE 97 01/14/2015   ALT 25 01/14/2015   AST 27 01/14/2015   NA 133* 01/14/2015   K 4.7 01/14/2015   CL 107 01/14/2015   CREATININE 0.58* 01/14/2015   BUN 14 01/14/2015  CO2 16* 01/14/2015   TSH 0.744 12/13/2014   INR 0.94 01/14/2015    PFT's : FEV1 3.33 84% DLCO 14.88 44%   Interpretation: The FEV1 is normal, but the FEV1/FVC ratio and FEF25-75% are reduced. The airway resistance is normal. Lung volumes are within normal limits. Following administration of bronchodilators, there is no significant response. The reduced diffusing capacity indicates a severe loss of functional alveolar capillary surface. Conclusions: Although there is airway obstruction and a diffusion defect suggesting emphysema, the absence of overinflation is inconsistent with that diagnosis. In view of the severity of the diffusion defect, studies with exercise would be helpful to evaluate the presence of hypoxemia. Pulmonary Function Diagnosis: Minimal Obstructive Airways  Disease Insignificant response to bronchodilator Severe Diffusion Defect Normal lung volumes  6 Minute Walk Test Results  Patient:Brance K Latchford Date:01/13/2015   Supplemental O2 during test?NO   BaselineEnd  [id number] Heartrate6668 Dyspnea00 Fatigue00.5 O2 sat98%97% Blood pressure115/82117/79   Patient ambulated at a regular slow pace for a total distance of 1176 feet with no stops.  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 I have recommended to the patient we proceed with Bronchoscopy, left VATS, lung resection and node dissection. Risks and options have been reviewed with patient and family. He understands that following surgery he will need chemotherapy as discussed with Dr Mckinley Jewel.  The goals risks and alternatives of the planned surgical procedure Bronchoscopy, left VATS, lung resection and node dissection have been discussed with the patient in detail. The risks of the procedure including death, infection, stroke, myocardial infarction, bleeding, blood transfusion have all been discussed specifically.  I have quoted [name] a 3 % of perioperative mortality and a complication rate as high as 30 %. The patient's questions have been answered.[name] is willing  to proceed with the planned  procedure.   Grace Isaac MD      [address] [place] [address] Office [phone]   Beeper [phone]  01/14/2015 8:10 AM

## 2015-01-14 NOTE — Progress Notes (Signed)
      [address]       [place] [address]             [phone]      Requesting nicotine patch  BP 120/75 mmHg  Pulse 86  Temp(Src) 98.1 F (36.7 C) (Oral)  Resp 24  Ht 6' (1.829 m)  Wt 166 lb (75.297 kg)  BMI 22.51 kg/m2  SpO2 96%   Intake/Output Summary (Last 24 hours) at 01/14/15 1738 Last data filed at 01/14/15 1700  Gross per 24 hour  Intake 2368.75 ml  Output   1410 ml  Net 958.75 ml   Stable early postop  Will order nicotine patch  Remo Lipps C. Roxan Hockey, MD [facility] [phone]

## 2015-01-14 NOTE — Transfer of Care (Signed)
Immediate Anesthesia Transfer of Care Note  Patient: Timothy Obrien  Procedure(s) Performed: Procedure(s): VIDEO BRONCHOSCOPY (N/A) VIDEO ASSISTED THORACOSCOPY (VATS)/WEDGE RESECTION, Superior segmentectomy left  lower lobe, wedge resection of left upper lobe, multiple lymph node disection, On Q insertion. (Left)  Patient Location: PACU  Anesthesia Type:General  Level of Consciousness: awake, alert  and oriented  Airway & Oxygen Therapy: Patient connected to nasal cannula oxygen  Post-op Assessment: Report given to RN  Post vital signs: stable  Last Vitals:  Filed Vitals:   01/14/15 0737 01/14/15 1456  BP: 118/87 134/65  Pulse: 57 79  Temp: 36.8 C 36.1 C  Resp: 18 25    Complications: No apparent anesthesia complications

## 2015-01-14 NOTE — Anesthesia Preprocedure Evaluation (Signed)
Anesthesia Evaluation  Patient identified by MRN, date of birth, ID band Patient awake    Reviewed: Allergy & Precautions, H&P , NPO status , Patient's Chart, lab work & pertinent test results  History of Anesthesia Complications Negative for: history of anesthetic complications  Airway Mallampati: II  TM Distance: >3 FB Neck ROM: full    Dental no notable dental hx.    Pulmonary Current Smoker,  Lung cancer   Pulmonary exam normal breath sounds clear to auscultation       Cardiovascular negative cardio ROS Normal cardiovascular exam Rhythm:regular Rate:Normal     Neuro/Psych Seizures -,  Last 12/11/14    GI/Hepatic negative GI ROS, (+)     substance abuse  marijuana use,   Endo/Other  negative endocrine ROS  Renal/GU negative Renal ROS     Musculoskeletal   Abdominal   Peds  Hematology negative hematology ROS (+)   Anesthesia Other Findings Lung cancer with brain mets that caused seizures and required craniotomy 12/26/14  Reproductive/Obstetrics negative OB ROS                             Anesthesia Physical Anesthesia Plan  ASA: III  Anesthesia Plan: General   Post-op Pain Management:    Induction: Intravenous  Airway Management Planned: Oral ETT  Additional Equipment: Arterial line and CVP  Intra-op Plan:   Post-operative Plan: Extubation in OR  Informed Consent: I have reviewed the patients History and Physical, chart, labs and discussed the procedure including the risks, benefits and alternatives for the proposed anesthesia with the patient or authorized representative who has indicated his/her understanding and acceptance.   Dental Advisory Given  Plan Discussed with: Anesthesiologist, CRNA and Surgeon  Anesthesia Plan Comments: (Plan GA with double lumen tube, likely size 39 Arterial line + central line)        Anesthesia Quick Evaluation

## 2015-01-14 NOTE — Anesthesia Procedure Notes (Addendum)
Central Venous Catheter Insertion Performed by: anesthesiologist 01/14/2015 8:14 AM Patient location: Pre-op. Preanesthetic checklist: patient identified, IV checked, site marked, risks and benefits discussed, surgical consent, monitors and equipment checked, pre-op evaluation, timeout performed and anesthesia consent Position: Trendelenburg Lidocaine 1% used for infiltration Landmarks identified and Seldinger technique used Catheter size: 8 Fr Central line was placed.Double lumen Procedure performed using ultrasound guided technique. Attempts: 1 Following insertion, dressing applied, line sutured and Biopatch. Post procedure assessment: blood return through all ports. Patient tolerated the procedure well with no immediate complications.   Procedure Name: Intubation Date/Time: 01/14/2015 8:45 AM Performed by: Mariea Clonts Pre-anesthesia Checklist: Patient identified, Timeout performed, Emergency Drugs available, Suction available and Patient being monitored Patient Re-evaluated:Patient Re-evaluated prior to inductionOxygen Delivery Method: Circle system utilized Preoxygenation: Pre-oxygenation with 100% oxygen Intubation Type: IV induction Ventilation: Mask ventilation without difficulty Laryngoscope Size: Miller and 3 Grade View: Grade II Tube type: Oral Tube size: 8.5 mm Number of attempts: 1 Placement Confirmation: ETT inserted through vocal cords under direct vision,  breath sounds checked- equal and bilateral and positive ETCO2 Tube secured with: Tape Dental Injury: Teeth and Oropharynx as per pre-operative assessment     Procedure Name: Intubation Date/Time: 01/14/2015 8:58 AM Performed by: Mariea Clonts Pre-anesthesia Checklist: Patient identified, Timeout performed, Emergency Drugs available, Patient being monitored and Suction available Patient Re-evaluated:Patient Re-evaluated prior to inductionOxygen Delivery Method: Circle system utilized Laryngoscope Size:  Sabra Heck and 3 Grade View: Grade II Tube type: Oral Endobronchial tube: Left, EBT position confirmed by fiberoptic bronchoscope, EBT position confirmed by auscultation and Double lumen EBT and 39 Fr Number of attempts: 1 Placement Confirmation: ETT inserted through vocal cords under direct vision,  breath sounds checked- equal and bilateral and positive ETCO2 Tube secured with: Tape Dental Injury: Teeth and Oropharynx as per pre-operative assessment

## 2015-01-14 NOTE — Anesthesia Postprocedure Evaluation (Signed)
Anesthesia Post Note  Patient: Timothy Obrien  Procedure(s) Performed: Procedure(s) (LRB): VIDEO BRONCHOSCOPY (N/A) VIDEO ASSISTED THORACOSCOPY (VATS)/WEDGE RESECTION, Superior segmentectomy left  lower lobe, wedge resection of left upper lobe, multiple lymph node disection, On Q insertion. (Left)  Patient location during evaluation: PACU Anesthesia Type: General Level of consciousness: awake and alert Pain management: pain level controlled Vital Signs Assessment: post-procedure vital signs reviewed and stable Respiratory status: spontaneous breathing, nonlabored ventilation, respiratory function stable and patient connected to nasal cannula oxygen Cardiovascular status: blood pressure returned to baseline and stable Postop Assessment: no signs of nausea or vomiting Anesthetic complications: no    Last Vitals:  Filed Vitals:   01/14/15 0737 01/14/15 1456  BP: 118/87 134/65  Pulse: 57 79  Temp: 36.8 C 36.1 C  Resp: 18 25    Last Pain:  Filed Vitals:   01/14/15 1518  PainSc: Asleep                 Zenaida Deed

## 2015-01-14 NOTE — Brief Op Note (Addendum)
01/14/2015  2:05 PM      [address]       [place] [address]             [phone]     01/14/2015  2:05 PM  PATIENT:  [name]  55 y.o. male  PRE-OPERATIVE DIAGNOSIS:  LEFT LUNG MASS  POST-OPERATIVE DIAGNOSIS:  LEFT LUNG MASS  PROCEDURE:  Procedure(s): VIDEO BRONCHOSCOPY VIDEO ASSISTED THORACOSCOPY (VATS)/WEDGE RESECTION LUL AND LLL SUPERIOR SEGMENTECTOMY WITH LND On Q placement  SURGEON:  Surgeon(s): Grace Isaac, MD  PHYSICIAN ASSISTANT: WAYNE GOLD PA-C  ANESTHESIA:   general  SPECIMEN:  Source of Specimen:  AS ABOVE   DISPOSITION OF SPECIMEN:  Pathology  DRAINS: 2 Chest Tube(s) in the LEFT HEMITHORAX   PATIENT CONDITION:  PACU - hemodynamically stable.  PRE-OPERATIVE WEIGHT: 91YN  EBL 829   COMPLICATIONS: NO KNOWN

## 2015-01-15 ENCOUNTER — Inpatient Hospital Stay (HOSPITAL_COMMUNITY): Payer: BLUE CROSS/BLUE SHIELD

## 2015-01-15 ENCOUNTER — Encounter (HOSPITAL_COMMUNITY): Payer: Self-pay | Admitting: Cardiothoracic Surgery

## 2015-01-15 LAB — POCT I-STAT 3, ART BLOOD GAS (G3+)
Acid-base deficit: 1 mmol/L (ref 0.0–2.0)
Bicarbonate: 21 mEq/L (ref 20.0–24.0)
O2 Saturation: 93 %
Patient temperature: 98.7
TCO2: 22 mmol/L (ref 0–100)
pCO2 arterial: 27.2 mmHg — ABNORMAL LOW (ref 35.0–45.0)
pH, Arterial: 7.496 — ABNORMAL HIGH (ref 7.350–7.450)
pO2, Arterial: 60 mmHg — ABNORMAL LOW (ref 80.0–100.0)

## 2015-01-15 LAB — CBC
HEMATOCRIT: 37.1 % — AB (ref 39.0–52.0)
Hemoglobin: 12.7 g/dL — ABNORMAL LOW (ref 13.0–17.0)
MCH: 33.1 pg (ref 26.0–34.0)
MCHC: 34.2 g/dL (ref 30.0–36.0)
MCV: 96.6 fL (ref 78.0–100.0)
PLATELETS: 192 10*3/uL (ref 150–400)
RBC: 3.84 MIL/uL — ABNORMAL LOW (ref 4.22–5.81)
RDW: 13.9 % (ref 11.5–15.5)
WBC: 8.1 10*3/uL (ref 4.0–10.5)

## 2015-01-15 LAB — BASIC METABOLIC PANEL
ANION GAP: 7 (ref 5–15)
BUN: 6 mg/dL (ref 6–20)
CALCIUM: 7.9 mg/dL — AB (ref 8.9–10.3)
CO2: 22 mmol/L (ref 22–32)
CREATININE: 0.53 mg/dL — AB (ref 0.61–1.24)
Chloride: 107 mmol/L (ref 101–111)
GFR calc non Af Amer: >60 mL/min (ref >60)
Glucose, Bld: 127 mg/dL — ABNORMAL HIGH (ref 65–99)
Potassium: 3.7 mmol/L (ref 3.5–5.1)
Sodium: 136 mmol/L (ref 135–145)

## 2015-01-15 MED ORDER — MUPIROCIN 2 % EX OINT
1.0000 "application " | TOPICAL_OINTMENT | Freq: Two times a day (BID) | CUTANEOUS | Status: DC
  Administered 2015-01-15 – 2015-01-18 (×5): 1 via NASAL
  Filled 2015-01-15 (×2): qty 22

## 2015-01-15 MED ORDER — ENOXAPARIN SODIUM 30 MG/0.3ML ~~LOC~~ SOLN
30.0000 mg | SUBCUTANEOUS | Status: DC
  Administered 2015-01-15 – 2015-01-17 (×3): 30 mg via SUBCUTANEOUS
  Filled 2015-01-15 (×3): qty 0.3

## 2015-01-15 MED ORDER — ALBUTEROL SULFATE (2.5 MG/3ML) 0.083% IN NEBU: 2.5000 mg | INHALATION_SOLUTION | RESPIRATORY_TRACT | Status: DC | PRN

## 2015-01-15 MED ORDER — IPRATROPIUM-ALBUTEROL 0.5-2.5 (3) MG/3ML IN SOLN
3.0000 mL | Freq: Two times a day (BID) | RESPIRATORY_TRACT | Status: DC
  Administered 2015-01-15 – 2015-01-16 (×3): 3 mL via RESPIRATORY_TRACT
  Filled 2015-01-15 (×5): qty 3

## 2015-01-15 MED ORDER — CHLORHEXIDINE GLUCONATE CLOTH 2 % EX PADS
6.0000 | MEDICATED_PAD | Freq: Every day | CUTANEOUS | Status: DC
  Administered 2015-01-16 – 2015-01-17 (×2): 6 via TOPICAL

## 2015-01-15 NOTE — Progress Notes (Signed)
Patient ID: Timothy Obrien, male   DOB: [birth year], 55 y.o.   MRN: [id number]  SICU Evening Rounds:  Hemodynamically stable  CT output low  Foley out this am and has not voided yet.  Ambulated 400 ft today.

## 2015-01-15 NOTE — Progress Notes (Signed)
Utilization review completed. Paulena Servais, RN, BSN. 

## 2015-01-15 NOTE — Progress Notes (Signed)
Patient ID: Timothy Obrien, male   DOB: Jun 06, 1960, 56 y.o.   MRN: [id number] T[facility] DAILY ICU PROGRESS NOTE                   [address]            [place] [address]          [phone]   1 Day Post-Op Procedure(s) (LRB): VIDEO BRONCHOSCOPY (N/A) VIDEO ASSISTED THORACOSCOPY (VATS)/WEDGE RESECTION, Superior segmentectomy left  lower lobe, wedge resection of left upper lobe, multiple lymph node disection, On Q insertion. (Left)  Total Length of Stay:  LOS: 1 day   Subjective: Feels well this am, good pain control. No respirtaoty problems  Objective: Vital signs in last 24 hours: Temp:  [97 F (36.1 C)-98.9 F (37.2 C)] 98.7 F (37.1 C) (01/05 0355) Pulse Rate:  [71-86] 83 (01/05 0700) Cardiac Rhythm:  [-] Normal sinus rhythm (01/05 0400) Resp:  [15-31] 24 (01/05 0700) BP: (95-140)/(56-83) 127/76 mmHg (01/05 0700) SpO2:  [90 %-98 %] 98 % (01/05 0740) Arterial Line BP: (69-154)/(46-77) 103/69 mmHg (01/05 0500)  Filed Weights   01/14/15 0737  Weight: 166 lb (75.297 kg)    Weight change:    Hemodynamic parameters for last 24 hours:    Intake/Output from previous day: 01/04 0701 - 01/05 0700 In: 4218.8 [P.O.:300; I.V.:3618.8; IV Piggyback:300] Out: 3490 [Urine:2870; Blood:100; Chest Tube:520]  Intake/Output this shift:    Current Meds: Scheduled Meds: . acetaminophen  1,000 mg Oral 4 times per day   Or  . acetaminophen (TYLENOL) oral liquid 160 mg/5 mL  1,000 mg Oral 4 times per day  . albuterol  2.5 mg Nebulization QID  . bisacodyl  10 mg Oral Daily  . fentaNYL   Intravenous 6 times per day  . levETIRAcetam  750 mg Oral BID  . nicotine  14 mg Transdermal Daily  . senna-docusate  1 tablet Oral QHS   Continuous Infusions: . bupivacaine 0.5 % ON-Q pump SINGLE CATH 400 mL    . dextrose 5 % and 0.9% NaCl 125 mL/hr (01/15/15 0155)   PRN Meds:.clonazePAM, diphenhydrAMINE **OR** diphenhydrAMINE, naloxone **AND** sodium chloride, ondansetron  (ZOFRAN) IV, oxyCODONE, potassium chloride, traMADol  General appearance: alert, cooperative and no distress Neurologic: intact Heart: regular rate and rhythm, S1, S2 normal, no murmur, click, rub or gallop Lungs: diminished breath sounds bibasilar Abdomen: soft, non-tender; bowel sounds normal; no masses,  no organomegaly Extremities: extremities normal, atraumatic, no cyanosis or edema and Homans sign is negative, no sign of DVT Wound: no air leak  Lab Results: CBC: Recent Labs  01/14/15 0703  01/14/15 1305 01/15/15 0415  WBC 6.1  --   --  8.1  HGB 14.6  < > 12.9* 12.7*  HCT 42.5  < > 38.0* 37.1*  PLT 207  --   --  192  < > = values in this interval not displayed. BMET:  Recent Labs  01/14/15 0703  01/14/15 1305 01/15/15 0415  NA 133*  < > 131* 136  K 4.7  < > 4.7 3.7  CL 107  --   --  107  CO2 16*  --   --  22  GLUCOSE 97  --   --  127*  BUN 14  --   --  6  CREATININE 0.58*  --   --  0.53*  CALCIUM 8.4*  --   --  7.9*  < > = values in this interval not displayed.  PT/INR:  Recent Labs  01/14/15 0703  LABPROT 12.8  INR 0.94   Radiology: Dg Chest Port 1 View  01/15/2015  CLINICAL DATA:  Left lung surgery. EXAM: PORTABLE CHEST 1 VIEW COMPARISON:  01/14/2015. FINDINGS: Two left chest tubes and left IJ line in stable position. No pneumothorax. Interim near complete clearing of left perihilar infiltrate. Cardiomegaly with partial clearing of interstitial prominence suggesting clearing congestive heart failure. No prominent pleural effusion. Left chest wall subcutaneous emphysema is stable. IMPRESSION: 1. 2 left chest tubes and left IJ line in stable position. No pneumothorax. 2. Interim near complete clearing of left perihilar infiltrate. 3. Cardiomegaly with interim partial clearing of interstitial prominence consistent with clearing congestive heart failure. Electronically Signed   By: Marcello Moores  Register   On: 01/15/2015 07:36   Dg Chest Port 1 View  01/14/2015  CLINICAL  DATA:  Lung cancer, status post VATS EXAM: PORTABLE CHEST 1 VIEW COMPARISON:  01/14/2015 FINDINGS: Cardiomediastinal silhouette is stable. Left IJ central line with tip in SVC. Mild perihilar interstitial prominence suspicious for mild interstitial edema. There is worsening atelectasis or infiltrate left perihilar and left suprahilar region to left side chest tubes are noted. There is no pneumothorax. IMPRESSION: Mild perihilar interstitial prominence suspicious for mild interstitial edema. Worsening left perihilar and suprahilar atelectasis or infiltrate. Two left side chest tube in place. No pneumothorax. Electronically Signed   By: Lahoma Crocker M.D.   On: 01/14/2015 15:35     Assessment/Plan: S/P Procedure(s) (LRB): VIDEO BRONCHOSCOPY (N/A) VIDEO ASSISTED THORACOSCOPY (VATS)/WEDGE RESECTION, Superior segmentectomy left  lower lobe, wedge resection of left upper lobe, multiple lymph node disection, On Q insertion. (Left) Mobilize Plan for transfer to step-down: see transfer orders See progression orders Ct to water seal  D/c a line Decrease iv fluid    Grace Isaac 01/15/2015 8:22 AM

## 2015-01-16 ENCOUNTER — Inpatient Hospital Stay (HOSPITAL_COMMUNITY): Payer: BLUE CROSS/BLUE SHIELD

## 2015-01-16 LAB — COMPREHENSIVE METABOLIC PANEL
ALBUMIN: 2.7 g/dL — AB (ref 3.5–5.0)
ALK PHOS: 60 U/L (ref 38–126)
ALT: 26 U/L (ref 17–63)
ANION GAP: 7 (ref 5–15)
AST: 31 U/L (ref 15–41)
BUN: 6 mg/dL (ref 6–20)
CALCIUM: 8.3 mg/dL — AB (ref 8.9–10.3)
CHLORIDE: 109 mmol/L (ref 101–111)
CO2: 22 mmol/L (ref 22–32)
Creatinine, Ser: 0.68 mg/dL (ref 0.61–1.24)
GFR calc Af Amer: >60 mL/min (ref >60)
GFR calc non Af Amer: >60 mL/min (ref >60)
GLUCOSE: 99 mg/dL (ref 65–99)
Potassium: 4.1 mmol/L (ref 3.5–5.1)
SODIUM: 138 mmol/L (ref 135–145)
Total Bilirubin: 0.9 mg/dL (ref 0.3–1.2)
Total Protein: 5.6 g/dL — ABNORMAL LOW (ref 6.5–8.1)

## 2015-01-16 LAB — CBC
HCT: 39.2 % (ref 39.0–52.0)
HEMOGLOBIN: 13.3 g/dL (ref 13.0–17.0)
MCH: 33.3 pg (ref 26.0–34.0)
MCHC: 33.9 g/dL (ref 30.0–36.0)
MCV: 98 fL (ref 78.0–100.0)
PLATELETS: 214 10*3/uL (ref 150–400)
RBC: 4 MIL/uL — AB (ref 4.22–5.81)
RDW: 14 % (ref 11.5–15.5)
WBC: 8.8 10*3/uL (ref 4.0–10.5)

## 2015-01-16 MED ORDER — DIPHENHYDRAMINE HCL 50 MG/ML IJ SOLN
12.5000 mg | Freq: Every evening | INTRAMUSCULAR | Status: DC | PRN
  Administered 2015-01-16: 12.5 mg via INTRAVENOUS
  Filled 2015-01-16: qty 1

## 2015-01-16 MED ORDER — ENSURE ENLIVE PO LIQD
237.0000 mL | Freq: Two times a day (BID) | ORAL | Status: DC
Start: 2015-01-16 — End: 2015-01-18
  Administered 2015-01-17: 237 mL via ORAL

## 2015-01-16 NOTE — Progress Notes (Signed)
Patient ID: Timothy Obrien, male   DOB: 07/27/1960, 55 y.o.   MRN: [id number] [facility] DAILY ICU PROGRESS NOTE                   [address]            [place] [address]          [phone]   2 Days Post-Op Procedure(s) (LRB): VIDEO BRONCHOSCOPY (N/A) VIDEO ASSISTED THORACOSCOPY (VATS)/WEDGE RESECTION, Superior segmentectomy left  lower lobe, wedge resection of left upper lobe, multiple lymph node disection, On Q insertion. (Left)  Total Length of Stay:  LOS: 2 days   Subjective: Feels well, good pain control  Objective: Vital signs in last 24 hours: Temp:  [97.7 F (36.5 C)-99.1 F (37.3 C)] 97.7 F (36.5 C) (01/05 2335) Pulse Rate:  [64-87] 82 (01/06 0600) Cardiac Rhythm:  [-] Normal sinus rhythm (01/06 0600) Resp:  [15-32] 21 (01/06 0600) BP: (109-144)/(66-89) 136/85 mmHg (01/06 0600) SpO2:  [93 %-99 %] 96 % (01/06 0709) Weight:  [156 lb 4.9 oz (70.9 kg)] 156 lb 4.9 oz (70.9 kg) (01/06 0430)  Filed Weights   01/14/15 0737 01/16/15 0430  Weight: 166 lb (75.297 kg) 156 lb 4.9 oz (70.9 kg)    Weight change: -9 lb 11.1 oz (-4.397 kg)   Hemodynamic parameters for last 24 hours:    Intake/Output from previous day: 01/05 0701 - 01/06 0700 In: 686.8 [P.O.:240; I.V.:396.8; IV Piggyback:50] Out: 1215 [Urine:925; Chest Tube:290]  Intake/Output this shift:    Current Meds: Scheduled Meds: . acetaminophen  1,000 mg Oral 4 times per day   Or  . acetaminophen (TYLENOL) oral liquid 160 mg/5 mL  1,000 mg Oral 4 times per day  . bisacodyl  10 mg Oral Daily  . Chlorhexidine Gluconate Cloth  6 each Topical Daily  . enoxaparin (LOVENOX) injection  30 mg Subcutaneous Q24H  . fentaNYL   Intravenous 6 times per day  . ipratropium-albuterol  3 mL Nebulization BID  . levETIRAcetam  750 mg Oral BID  . mupirocin ointment  1 application Nasal BID  . nicotine  14 mg Transdermal Daily  . senna-docusate  1 tablet Oral QHS   Continuous Infusions: . bupivacaine 0.5 %  ON-Q pump SINGLE CATH 400 mL    . dextrose 5 % and 0.9% NaCl 10 mL/hr (01/15/15 0827)   PRN Meds:.albuterol, clonazePAM, diphenhydrAMINE **OR** diphenhydrAMINE, naloxone **AND** sodium chloride, ondansetron (ZOFRAN) IV, oxyCODONE, potassium chloride, traMADol  General appearance: alert and cooperative Neurologic: intact Heart: regular rate and rhythm, S1, S2 normal, no murmur, click, rub or gallop Lungs: clear to auscultation bilaterally Abdomen: soft, non-tender; bowel sounds normal; no masses,  no organomegaly Extremities: extremities normal, atraumatic, no cyanosis or edema and Homans sign is negative, no sign of DVT Wound: no air leak  Lab Results: CBC: Recent Labs  01/15/15 0415 01/16/15 0450  WBC 8.1 8.8  HGB 12.7* 13.3  HCT 37.1* 39.2  PLT 192 214   BMET:  Recent Labs  01/15/15 0415 01/16/15 0450  NA 136 138  K 3.7 4.1  CL 107 109  CO2 22 22  GLUCOSE 127* 99  BUN 6 6  CREATININE 0.53* 0.68  CALCIUM 7.9* 8.3*    PT/INR:  Recent Labs  01/14/15 0703  LABPROT 12.8  INR 0.94   Radiology: No results found.   Assessment/Plan: S/P Procedure(s) (LRB): VIDEO BRONCHOSCOPY (N/A) VIDEO ASSISTED THORACOSCOPY (VATS)/WEDGE RESECTION, Superior segmentectomy left  lower lobe, wedge resection of  left upper lobe, multiple lymph node disection, On Q insertion. (Left) Mobilize d/c tubes/lines Plan for transfer to step-down: see transfer orders anterior chest tube out today, poss remove posterior tube and on q tomorrow, poss home sunday Path not back yet   Grace Isaac 01/16/2015 7:22 AM

## 2015-01-16 NOTE — Discharge Instructions (Signed)
Thoracoscopy, Care After Refer to this sheet in the next few weeks. These instructions provide you with information about caring for yourself after your procedure. Your health care provider may also give you more specific instructions. Your treatment has been planned according to current medical practices, but problems sometimes occur. Call your health care provider if you have any problems or questions after your procedure. WHAT TO EXPECT AFTER THE PROCEDURE: After your procedure, it is common to feel sore for up to two weeks. HOME CARE INSTRUCTIONS  There are many different ways to close and cover an incision, including stitches (sutures), skin glue, and adhesive strips. Follow your health care provider's instructions about:  Incision care.  Bandage (dressing) changes and removal.  Incision closure removal.  Check your incision area every day for signs of infection. Watch for:  Redness, swelling, or pain.  Fluid, blood, or pus.  Take medicines only as directed by your health care provider.  Try to cough often. Coughing helps to protect against lung infection (pneumonia). It may hurt to cough. If this happens, hold a pillow against your chest when you cough.  Take deep breaths. This also helps to protect against pneumonia.  If you were given an incentive spirometer, use it as directed by your health care provider.  Do not take baths, swim, or use a hot tub until your health care provider approves. You may take showers.  Avoid lifting until your health care provider approves.  Avoid driving until your health care provider approves.  Do not travel by airplane after the chest tube is removed until your health care provider approves. SEEK MEDICAL CARE IF:  You have a fever.  Pain medicines do not ease your pain.  You have redness, swelling, or increasing pain in your incision area.  You develop a cough that does not go away, or you are coughing up mucus that is yellow or  green. SEEK IMMEDIATE MEDICAL CARE IF:  You have fluid, blood, or pus coming from your incision.  There is a bad smell coming from your incision or dressing.  You develop a rash.  You have difficulty breathing.  You cough up blood.  You develop light-headedness or you feel faint.  You develop chest pain.  Your heartbeat feels irregular or very fast.   This information is not intended to replace advice given to you by your health care provider. Make sure you discuss any questions you have with your health care provider.   Document Released: 07/16/2004 Document Revised: 01/17/2014 Document Reviewed: 09/11/2013 Elsevier Interactive Patient Education 2016 [place] Lung cancer occurs when abnormal cells in the lung grow out of control and form a mass (tumor). There are several types of lung cancer. The two most common types are:  Non-small cell. In this type of lung cancer, abnormal cells are larger and grow more slowly than those of small cell lung cancer.  Small cell. In this type of lung cancer, abnormal cells are smaller than those of non-small cell lung cancer. Small cell lung cancer gets worse faster than non-small cell lung cancer. CAUSES  The leading cause of lung cancer is smoking tobacco. The second leading cause is radon exposure. RISK FACTORS  Smoking tobacco.  Exposure to secondhand tobacco smoke.  Exposure to radon gas.  Exposure to asbestos.  Exposure to arsenic in drinking water.  Air pollution.  Family or personal history of lung cancer.  Lung radiation therapy.  Being older than 62 years. SIGNS AND SYMPTOMS  In the early stages, symptoms may not be present. As the cancer progresses, symptoms may include:  A lasting cough, possibly with blood.  Fatigue.  Unexplained weight loss.  Shortness of breath.  Wheezing.  Chest pain.  Loss of appetite. Symptoms of advanced lung cancer include:  Hoarseness.  Bone or joint  pain.  Weakness.  Nail problems.  Face or arm swelling.  Paralysis of the face.  Drooping eyelids. DIAGNOSIS  Lung cancer can be identified with a physical exam and with tests such as:  A chest X-ray.  A CT scan.  Blood tests.  A biopsy. After a diagnosis is made, you will have more tests to determine the stage of the cancer. The stages of non-small cell lung cancer are:  Stage 0, also called carcinoma in situ. At this stage, abnormal cells are found in the inner lining of your lung or lungs.  Stage I. At this stage, abnormal cells have grown into a tumor that is no larger than 5 cm across. The cancer has entered the deeper lung tissue but has not yet entered the lymph nodes or other parts of the body.  Stage II. At this stage, the tumor is 7 cm across or smaller and has entered nearby lymph nodes. Or, the tumor is 5 cm across or smaller and has invaded surrounding tissue but is not found in nearby lymph nodes. There may be more than one tumor present.  Stage III. At this stage, the tumor may be any size. There may be more than one tumor in the lungs. The cancer cells have spread to the lymph nodes and possibly to other organs.  Stage IV. At this stage, there are tumors in both lungs and the cancer has spread to other areas of the body. The stages of small cell lung cancer are:  Limited. At this stage, the cancer is found only on one side of the chest.  Extensive. At this stage, the cancer is in the lungs and in tissues on the other side of the chest. The cancer has spread to other organs or is found in the fluid between the layers of your lungs. TREATMENT  Depending on the type and stage of your lung cancer, you may be treated with:  Surgery. This is done to remove a tumor.  Radiation therapy. This treatment destroys cancer cells using X-rays or other types of radiation.  Chemotherapy. This treatment uses medicines to destroy cancer cells.  Targeted therapy. This  treatment aims to destroy only cancer cells instead of all cells as other therapies do. You may also have a combination of treatments. HOME CARE INSTRUCTIONS   Do not use any tobacco products. This includes cigarettes, chewing tobacco, and electronic cigarettes. If you need help quitting, ask your health care provider.  Take medicines only as directed by your health care provider.  Eat a healthy diet. Work with a dietitian to make sure you are getting the nutrition you need.  Consider joining a support group or seeking counseling to help you cope with the stress of having lung cancer.  Let your cancer specialist (oncologist) know if you are admitted to the hospital.  Keep all follow-up visits as directed by your health care provider. This is important. SEEK MEDICAL CARE IF:   You lose weight without trying.  You have a persistent cough and wheezing.  You feel short of breath.  You tire easily.  You experience bone or joint pain.  You have difficulty swallowing.  You feel  hoarse or notice your voice changing.  Your pain medicine is not helping. SEEK IMMEDIATE MEDICAL CARE IF:   You cough up blood.  You have new breathing problems.  You develop chest pain.  You develop swelling in:  One or both ankles or legs.  Your face, neck, or arms.  You are confused.  You experience paralysis in your face or a drooping eyelid.   This information is not intended to replace advice given to you by your health care provider. Make sure you discuss any questions you have with your health care provider.   Document Released: 04/04/2000 Document Revised: 09/17/2014 Document Reviewed: 05/02/2013 Elsevier Interactive Patient Education [employer].

## 2015-01-16 NOTE — Discharge Summary (Signed)
Physician Discharge Summary  Patient ID: Timothy Obrien MRN: [id number] DOB/AGE: July 23, 1960 55 y.o.  Admit date: 01/14/2015 Discharge date: 01/18/2015  Admission Diagnoses: lung mass  Discharge Diagnoses:  Active Problems:   Lung cancer ([place])   Malnutrition of moderate degree  Patient Active Problem List   Diagnosis Date Noted  . Malnutrition of moderate degree 01/17/2015  . Lung cancer ([place]) 01/14/2015  . S/P craniotomy 12/26/2014  . Brain metastasis ([place]) 12/26/2014  . Primary cancer of left lower lobe of lung ([place]) 12/22/2014  . Metastasis to brain ([place]) 12/12/2014  . Right sided weakness 12/12/2014  . Brain metastases ([place]) 12/12/2014  . Acute right-sided weakness 12/12/2014  . Tobacco abuse     History of Present Illness:  Timothy Obrien 55 y.o. male is seen in the office today for evaluation of possible resection of left lower lobe superior segment. He present last week to ER with new onset of seizure. 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 He under went radiation rx to brain and resection 6 days ago. Patient is long term and current smoker.  He is recovering quickly from recent brain surgery. We discussed the need and techniques to quit smoking. He was admitted for elective thoracoscopic resection. F  INAL DIAGNOSIS Diagnosis: final pathology 1. Lung, wedge biopsy/resection, Left lower lobe - INVASIVE ADENOCARCINOMA, POORLY DIFFERENTIATED, SPANNING 3.5 CM. - CARCINOMA IS PRESENT AT THE LOWER LOBE RESECTION MARGIN OF SPECIMEN #1. - ADENOCARCINOMA INVOLVES THE PLEURA. - LYMPHOVASCULAR INVASION IS IDENTIFIED. - SEE ONCOLOGY TABLE BELOW. 2. Lung, wedge biopsy/resection, left upper lobe - BENIGN LUNG PARENCHYMA. - THERE IS NO EVIDENCE OF MALIGNANCY. 3. Lymph node, biopsy, 11 L - THERE IS NO EVIDENCE OF CARCINOMA IN 1 OF 1 LYMPH NODE (0/1). 4. Lymph node, biopsy, 8 - THERE IS NO EVIDENCE OF CARCINOMA IN 1 OF 1  LYMPH NODE (0/1). 5. Lymph node, biopsy, 10 L #2 - METASTATIC CARCINOMA IN 1 OF 1 LYMPH NODE (1/1). 6. Lymph node, biopsy, 10 L - THERE IS NO EVIDENCE OF CARCINOMA IN 1 OF 1 LYMPH NODE (0/1). 7. Lymph node, biopsy, 5 L node - THERE IS NO EVIDENCE OF CARCINOMA IN 1 OF 1 LYMPH NODE (0/1). Microscopic Comment 1. LUNG Specimen, including laterality: Left upper and lower lobes. Procedure: Left lower lobe superior segmentectomy and portion of left upper lobe resection. Specimen integrity (intact/disrupted): Intact. Tumor site: Subpleural. Tumor focality: Unifocal Maximum tumor size (cm): 3.5 cm Histologic type: Adenocarcinoma. Grade: Poorly differentiated. 1 of 3 FINAL for Timothy Obrien, Timothy Obrien ([id number]) Microscopic Comment(continued) Margins: Adenocarcinoma is focally present at the lower lobe margin of specimen #1, see comment. Visceral pleura invasion: Present. Tumor extension: Confined to lung parenchyma. Treatment effect (if treated with neoadjuvant therapy): N/A Lymph -Vascular invasion: Present. Lymph nodes: Number examined - 5; Number N1 nodes positive 1; Number N2 nodes positive 0 TNM code: pT2a, pN1, pM1 (based on case [phone]). Ancillary studies: A specimen was sent out for molecular test from the patient's previous resection, [phone]. Non-neoplastic lung: Anthracosis and intraalveolar pigment laden macrophages. Comments: The specimen reveals a poorly differentiated non-small cell carcinoma that is morphologically similar to the tumor in the previous CNS resection. Based on the immunohistochemical stains performed on the patient's resection ([id number]), the immunohistochemical phenotype is consistent with a primary lung adenocarcinoma. The black stitched margin, corresponding to the lower lobe margin of specimen #1, shows malignant epithelial cells, best seen on the permanent sections. (JBK:ecj 01/16/2015)  Enid Cutter MD Pathologist, Electronic Signature (Case  signed 01/16/2015) Intraoperative Diagnosis 1. LUNG WEDGE BIOPSY, LEFT LOWER LOBE: A. WHITE STITCH MARGIN: HIGHLY ATYPICAL EPITHELIUM. B. NON SMALL CELL CARCINOMA. C. BLACK STITCH: NEGATIVE FOR TUMOR. (CRR) Total: 3 2. LUNG, WEDGE BIOPSY, LEFT UPPER LOBE: MARGIN IS BENIGN. (CRR) Total: 1 Specimen Gross and Clinical Information Specimen(s) Obtained: 1. Lung, wedge biopsy/resection, Left lower lobe 2. Lung, wedge biopsy/resection, left upper lobe 3. Lymph node, biopsy, 11 L 4. Lymph node, biopsy, 8 5. Lymph node, biopsy, 10 L #2 6. Lymph node, biopsy, 10 L 7. Lymph node, biopsy, 5 L node Specimen Clinical Information 1. Left lung mass (tl) Gross 1. Specimen: Received fresh for frozen section is a portion of left lower lobe superior segment with attached left upper lobe, containing a white suture marking the upper lobe and a black suture marking the lower lobe. Specimen integrity (intact/incised/disrupted): Intact with multiple staple lines. A 2.0 cm in length portion of stapled vascular margin is appreciated. Size, weight: 37.2 grams, 8.0 x 4.0 x 2.8 cm. Pleura: The pleura is pink-purple, focally ragged and adherent with a focal glistening white area adjacent to the lesion. 2 of 3 FINAL for Timothy Obrien, Timothy Obrien ([id number]) Gross(continued) Cut Surface: Sectioning reveals a 3.5 x 3.0 x 2.5 cm firm tan white lesion which is centrally soft yellow necrotic and pigmented. There is little uninvolved parenchyma which is spongy, red-brown. Margin(s): The lesion grossly abuts the pleura and is located 0.1 cm from the white stitch designating upper lobe and is 0.2 cm from the black stitch designating the lower lobe. Representative sections of lesion are submitted for research. Block Summary: A= representative upper lobe margin for frozen section B= representative lesion for frozen section C= representative lower lobe margin for frozen section. D,E= lesion to inked pleura. F,G= representative  lesion H,I= representative lesion to adjacent normal parenchyma. J= vascular margin. K= uninvolved parenchyma. 11 blocks total. (KF:gt, 01/15/15) 2. Received fresh for frozen section is a 3.5 x 1.0 x 0.3 cm portion of tan-purple lung tissue with a stapled margin and a suture designating the new margin. The specimen is sectioned and entirely submitted for frozen section analysis in block 2A. 3. Received in saline is a 0.5 x 0.5 x 0.2 cm portion of pigmented-red soft tissue which is submitted in toto 4. Received in saline is a 0.4 x 0.3 x 0.2 cm portion of pigmented soft tissue which is submitted in toto. 5. Received in saline is a 0.8 x 0.5 x 0.2 cm aggregate of tan-red and pigmented soft tissues which are submitted in toto. 6. Received in saline is a 0.5 x 0.5 x 0.2 cm aggregate of pigmented-tan soft tissue which is submitted in toto. 7. Received in saline is a 0.6 x 0.3 x 0.2 cm portion of tan-pigmented soft tissue which is submitted in toto. Report signed out from the following location(s) Technical component and interpretation was performed at [facility], [place], [place] [address]. CLIA #: [id number], 3 of 3  Discharged Condition: good  Hospital Course: The patient was admitted electively for resection. He underwent the described procedure. He is tolerated it well without significant difficulties. All routine lines, monitors and drainage devices have been discontinued in the standard fashion. Pathology is noted above. Incisions are healing well without evidence of infection. He is tolerating diet and activity. Oxygen has been weaned and he maintains good saturations on room air. Laboratory values are stable. He is felt to be stable  for discharge on today's date.  Consults: None  Significant Diagnostic Studies: routine post-op labs and CXR's  01/14/2015  2:05 PM  PATIENT: Timothy Obrien 55 y.o. male  PRE-OPERATIVE DIAGNOSIS: LEFT LUNG  MASS  POST-OPERATIVE DIAGNOSIS: LEFT LUNG MASS  PROCEDURE: Procedure(s): VIDEO BRONCHOSCOPY VIDEO ASSISTED THORACOSCOPY (VATS)/WEDGE RESECTION LUL AND LLL SUPERIOR SEGMENTECTOMY WITH LND On Q placement  SURGEON: Surgeon(s): Grace Isaac, MD  PHYSICIAN ASSISTANT: WAYNE GOLD PA-C  ANESTHESIA: general  Treatments: surgery:   Discharge Exam: Blood pressure 100/64, pulse 67, temperature 97.8 F (36.6 C), temperature source Oral, resp. rate 18, height 6' (1.829 m), weight 156 lb 4.9 oz (70.9 kg), SpO2 97 %. Disposition: 01-Home or Self Care    General appearance: alert, cooperative and no distress Heart: regular rate and rhythm Lungs: clear to auscultation bilaterally Abdomen: benign Extremities: no edema Wound: incis heaing well, minor serous drainage from CT site    Medication List    STOP taking these medications        acetaminophen 325 MG tablet  Commonly known as:  TYLENOL     dexamethasone 4 MG tablet  Commonly known as:  DECADRON     HYDROcodone-acetaminophen 7.5-325 MG tablet  Commonly known as:  NORCO      TAKE these medications        clonazePAM 0.5 MG tablet  Commonly known as:  KLONOPIN  Take 1 tablet (0.5 mg total) by mouth 3 (three) times daily as needed for anxiety.     levETIRAcetam 750 MG tablet  Commonly known as:  KEPPRA  Take 1 tablet (750 mg total) by mouth 2 (two) times daily.     mupirocin ointment 2 %  Commonly known as:  BACTROBAN  Place 1 application into the nose 2 (two) times daily.     oxyCODONE 5 MG immediate release tablet  Commonly known as:  Oxy IR/ROXICODONE  Take 10 tablets (50 mg total) by mouth every 6 (six) hours as needed for moderate pain or severe pain.     pantoprazole 20 MG tablet  Commonly known as:  PROTONIX  Take 1 tablet (20 mg total) by mouth daily.       Follow-up Information    Follow up with Grace Isaac, MD.   Specialty:  Cardiothoracic Surgery   Why:  Appointment to see the surgeon  in 02/05/2015 at 1:15pm. Please obtain a chest x-ray [facility] one half hour prior to this appointment. [facility] is located in the same office complex.   Contact information:   [place] [address] [phone]       Follow up with [facility].   Specialty:  Cardiothoracic Surgery   Why:  Nurse appointment for suture removal 01/26/15 10:30 am   Contact information:   [place], [place] [phone]      Signed: John Giovanni 01/18/2015, 9:08 AM

## 2015-01-16 NOTE — Progress Notes (Signed)
Report  Given to 2 [place] patient going to 2W19

## 2015-01-16 NOTE — Progress Notes (Signed)
Patient received on floor, assessment completed and consistent with report received from [place], [place] on 2S.  No c/o pain, Central Telemetry called by two RNs to verify pt transfer.

## 2015-01-16 NOTE — Progress Notes (Signed)
Initial Nutrition Assessment  DOCUMENTATION CODES:   Non-severe (moderate) malnutrition in context of chronic illness  INTERVENTION:    Ensure Enlive PO BID, each supplement provides 350 kcal and 20 grams of protein  NUTRITION DIAGNOSIS:   Increased nutrient needs related to catabolic illness as evidenced by estimated needs.  GOAL:   Patient will meet greater than or equal to 90% of their needs  MONITOR:   PO intake, Supplement acceptance, Labs, Weight trends, I & O's  REASON FOR ASSESSMENT:   Malnutrition Screening Tool    ASSESSMENT:   55 y.o. male is seen in the office today for evaluation of possible resection of left lower lobe superior segment. He present last week to ER with new onset of seizure. He was found to have brain metastatic disease likely from lung with left lung lesion. He under went radiation rx to brain and resection 6 days ago. S/P VATS 1/4.   Patient reports that he has lost a lot of weight, "because I have been growing tumors." He has been eating very well and has a good appetite. Nutrition-Focused physical exam completed. Findings are no fat depletion, mild-moderate muscle depletion, and no edema. Patient with moderate PCM. He agreed to Ensure between meals while in the hospital, but does not want to buy it for home.   Diet Order:  Diet regular Room service appropriate?: Yes; Fluid consistency:: Thin  Skin:  Reviewed, no issues  Last BM:  unknown  Height:   Ht Readings from Last 1 Encounters:  01/14/15 6' (1.829 m)    Weight:   Wt Readings from Last 1 Encounters:  01/16/15 156 lb 4.9 oz (70.9 kg)    Ideal Body Weight:  80.9 kg  BMI:  Body mass index is 21.19 kg/(m^2).  Estimated Nutritional Needs:   Kcal:  2200-2400  Protein:  110-130 gm  Fluid:  2.2-2.4 L  EDUCATION NEEDS:   No education needs identified at this time  Molli Barrows, [place], [place], [place] Pager [phone] After Hours Pager [phone]

## 2015-01-17 ENCOUNTER — Inpatient Hospital Stay (HOSPITAL_COMMUNITY): Payer: BLUE CROSS/BLUE SHIELD

## 2015-01-17 DIAGNOSIS — E440 Moderate protein-calorie malnutrition: Secondary | ICD-10-CM | POA: Insufficient documentation

## 2015-01-17 MED ORDER — DIPHENHYDRAMINE HCL 12.5 MG/5ML PO ELIX
12.5000 mg | ORAL_SOLUTION | Freq: Every evening | ORAL | Status: DC | PRN
  Administered 2015-01-18: 12.5 mg via ORAL
  Filled 2015-01-17: qty 10

## 2015-01-17 NOTE — Progress Notes (Signed)
Final CT pulled per order and unit protocol.  No flowsheet to document on.  Pt tolerated very well.  Occlusive dressing applied with pressure.  Pt understands bedrest for 30 mins.  Will con't plan of care.

## 2015-01-17 NOTE — Progress Notes (Addendum)
[address]       [place] [address]             [phone]      3 Days Post-Op Procedure(s) (LRB): VIDEO BRONCHOSCOPY (N/A) VIDEO ASSISTED THORACOSCOPY (VATS)/WEDGE RESECTION, Superior segmentectomy left  lower lobe, wedge resection of left upper lobe, multiple lymph node disection, On Q insertion. (Left) Subjective: Feels pretty well overall  Objective: Vital signs in last 24 hours: Temp:  [98.4 F (36.9 C)-98.6 F (37 C)] 98.4 F (36.9 C) (01/07 0455) Pulse Rate:  [59-92] 92 (01/07 0455) Cardiac Rhythm:  [-] Normal sinus rhythm (01/06 1944) Resp:  [16-18] 16 (01/07 0455) BP: (121-137)/(71-122) 133/82 mmHg (01/07 0455) SpO2:  [95 %-98 %] 96 % (01/07 0455)  Hemodynamic parameters for last 24 hours:    Intake/Output from previous day: 01/06 0701 - 01/07 0700 In: 30 [I.V.:30] Out: 1020 [Urine:850; Chest Tube:170] Intake/Output this shift:    General appearance: alert, cooperative and no distress Heart: regular rate and rhythm Lungs: ctackles and dim in left base , otherwise clear Abdomen: benign Extremities: no edema Wound: incis healing well  Lab Results:  Recent Labs  01/15/15 0415 01/16/15 0450  WBC 8.1 8.8  HGB 12.7* 13.3  HCT 37.1* 39.2  PLT 192 214   BMET:  Recent Labs  01/15/15 0415 01/16/15 0450  NA 136 138  K 3.7 4.1  CL 107 109  CO2 22 22  GLUCOSE 127* 99  BUN 6 6  CREATININE 0.53* 0.68  CALCIUM 7.9* 8.3*    PT/INR: No results for input(s): LABPROT, INR in the last 72 hours. ABG    Component Value Date/Time   PHART 7.496* 01/15/2015 0413   HCO3 21.0 01/15/2015 0413   TCO2 22 01/15/2015 0413   ACIDBASEDEF 1.0 01/15/2015 0413   O2SAT 93.0 01/15/2015 0413   CBG (last 3)  No results for input(s): GLUCAP in the last 72 hours.  Meds Scheduled Meds: . acetaminophen  1,000 mg Oral 4 times per day   Or  . acetaminophen (TYLENOL) oral liquid 160 mg/5 mL  1,000 mg Oral 4 times per day  . bisacodyl  10 mg Oral  Daily  . Chlorhexidine Gluconate Cloth  6 each Topical Daily  . enoxaparin (LOVENOX) injection  30 mg Subcutaneous Q24H  . feeding supplement (ENSURE ENLIVE)  237 mL Oral BID BM  . ipratropium-albuterol  3 mL Nebulization BID  . levETIRAcetam  750 mg Oral BID  . mupirocin ointment  1 application Nasal BID  . nicotine  14 mg Transdermal Daily  . senna-docusate  1 tablet Oral QHS   Continuous Infusions: . bupivacaine 0.5 % ON-Q pump SINGLE CATH 400 mL    . dextrose 5 % and 0.9% NaCl Stopped (01/16/15 1000)   PRN Meds:.albuterol, clonazePAM, diphenhydrAMINE, ondansetron (ZOFRAN) IV, oxyCODONE, potassium chloride, traMADol  Xrays Dg Chest Port 1 View  01/16/2015  CLINICAL DATA:  Bronchoscopy.  Wedge resection. EXAM: PORTABLE CHEST 1 VIEW COMPARISON:  01/15/2015. FINDINGS: Left IJ line, left chest tubes in stable position. Tiny left apical pneumothorax noted on today's exam. Partial clearing of basilar atelectasis. Heart size stable. IMPRESSION: 1. Two left chest tubes in stable position. Left IJ line stable position. 2. Tiny left apical pneumothorax noted on today's exam. 3. Partial clearing of bibasilar atelectasis . Critical Value/emergent results were called by telephone at the time of interpretation on 01/16/2015 at 7:51 am to [place], who verbally acknowledged these results. Electronically Signed  By: [place]   On: 01/16/2015 07:53    Assessment/Plan: S/P Procedure(s) (LRB): VIDEO BRONCHOSCOPY (N/A) VIDEO ASSISTED THORACOSCOPY (VATS)/WEDGE RESECTION, Superior segmentectomy left  lower lobe, wedge resection of left upper lobe, multiple lymph node disection, On Q insertion. (Left)   1 clinically conts to look quite good 2 CXR has not been done yet this am. Has no air leak on water seal, 190 cc serosang fluid out yesterday and 100 cc so far today. Had small pntx yesterday. Hopefully can get tube out today 3 cont pulm toilet/rehab 4 no seizures, cont Keppra 5 hemodyn stable ,  occ pac's/pvc;'s, mild systolic HTN at times- monitor for now 6 no new labs today 7 poss d/c in 24-48 hours  LOS: 3 days    Addendum : the pntx is a bit larger on today's film, will hold on tube  removal for now- it may just be a small space   Obrien,Timothy E 01/17/2015  I have seen and examined the patient and agree with the assessment and plan as outlined.  No air leak.  Small apical PTX.  Okay to d/c tube today.  Possible d/c home tomorrow.  Rexene Alberts, MD 01/17/2015 11:15 AM

## 2015-01-17 NOTE — Progress Notes (Signed)
Pt declines offer and encouragement to walk at this time.  States he just got up to the bathroom, and settled back in bed.  Will con't plan of care.

## 2015-01-18 ENCOUNTER — Inpatient Hospital Stay (HOSPITAL_COMMUNITY): Payer: BLUE CROSS/BLUE SHIELD

## 2015-01-18 MED ORDER — OXYCODONE HCL 5 MG PO TABS: 50.0000 mg | ORAL_TABLET | Freq: Four times a day (QID) | ORAL | Status: DC | PRN

## 2015-01-18 MED ORDER — MUPIROCIN 2 % EX OINT: 1.0000 "application " | TOPICAL_OINTMENT | Freq: Two times a day (BID) | CUTANEOUS | Status: DC

## 2015-01-18 NOTE — Op Note (Signed)
NAME:  Timothy Obrien, STATLER NO.:  [id number]  MEDICAL RECORD NO.:  [id number]  LOCATION:  [id number]                        FACILITY:  [facility]  PHYSICIAN:  Lanelle Bal, MD    DATE OF BIRTH:  02/16/1960  DATE OF PROCEDURE:  01/14/2015 DATE OF DISCHARGE:                              OPERATIVE REPORT   PREOPERATIVE DIAGNOSIS:  Lung mass, superior segment, left lower lobe with isolated brain metastasis from adenocarcinoma.  POSTOPERATIVE DIAGNOSIS:  Lung mass, superior segment, left lower lobe with isolated brain metastasis from adenocarcinoma.  SURGICAL PROCEDURE:  Bronchoscopy, left video-assisted thoracoscopy, left superior segmentectomy with wedge resection of left upper lobe mediastinal and lymph node dissection and placement of On-Q device.  SURGEON:  Lanelle Bal, MD  FIRST ASSISTANT:  John Giovanni, PA-C  BRIEF HISTORY:  The patient is a 55 year old male who presented approximately 1 month prior to surgery with new onset of seizures.  CT and MRI of the brain revealed an isolated left frontal lobe metastasis. He was given 1 treatment of radiation and underwent surgical resection of the brain metastasis and has recovered uneventfully.  A followup PET scan was performed and the patient's case was discussed and presented at the Multidisciplinary Thoracic Oncology Conference.  There was no evidence of mediastinal lymph node involvement on PET scan.  There was suggestion of N1 nodes.  After discussion, the composite was decided to offer the patient lung resection followed by chemotherapy.  Previous testing on the brain metastasis had revealed 100% expression of PDL1. Risks and options of surgery were discussed with the patient and his family in detail, and he was willing to proceed.  DESCRIPTION OF PROCEDURE:  The patient underwent general endotracheal anesthesia without incident.  Appropriate time-out was performed.  Then, we proceeded with a fiberoptic  bronchoscopy through the single-lumen endotracheal tube.  The tube was then removed and a double-lumen endotracheal tube was then placed and the patient was turned in lateral decubitus position with the left side up.  The left side had previously been marked and a second time-out was performed.  We then proceeded with prepping and draping the left chest.  Small incision was made at approximately 4th intercostal space.  Through this, a port was placed. The lung had deflated reasonably well.  The tumor was identified in the superior segment of the upper lobe but it became apparent that it had actually crossed the fissure and was involving the upper lobe also.  We then decided to proceed, enlarging incision slightly and working through 2 additional port sites, moved toward dissecting in the long the hilum. The lower lobe pulmonary artery was identified.  The pulmonary artery 2- views superior segment was identified.  We then decided to proceed with superior segmentectomy and wedge resection involving the upper lobe along the fissure.  Then using a vascular stapler the superior segmental artery was identified and stapled.  We then dissected along the posterior hilum and then stapled across the upper lobe dividing down to the hilum freeing the superior segment and a margin of the upper lobe from the superior segment.  We then clamped the superior segmental bronchus and inflated the lung, the upper lobe, and lower  portions in the upper lobe and the lower portion of the lobe inflated.  We divided the bronchus.  We then divided with a stapler across the lower lobe toward the hilum with approximately 1.5 cm below the mass.  The tumor was then placed and the mass was then placed in a specimen bag and retrieved through the incision.  A white suture was placed on the margin toward the upper lobe and a black suture placed on the margin toward the lower lobe, this was submitted to Pathology for  examination.  We then proceeded with lymph node dissection, dissecting out #8, 10, 11, and 5 nodes, each submitted separately in labeled specimen cups.  The inferior pulmonary ligament was divided up to the inferior pulmonary vein. During the nodal dissection, pathologist returned report of the margin toward the upper lobe, was microscopically positive and the margin toward the lower lobe was free of tumor.  We then proceeded with taking an additional margin of the wedge along the previous margin of the left upper lobe and submitted again for Pathology.  The new margin was reported as negative.  With the operative field hemostatic and the bronchial stump without evidence of air leak, we then proceeded with placing an On-Q device along the posterior chest wall subpleurally.  The incision was then closed, 2 sutures were placed through the lower rib and pericostal sutures were placed around the upper rib and through the holes in the lower rib.  The muscle layers were then closed with interrupted 0 Vicryl, running 3-0 Vicryl in subcutaneous tissue, 3-0 subcuticular stitch in the skin edges.  Dry dressings were applied.  The left lung reinflated nicely without evidence of air leak.  The Dermabond was placed on the incision, 2 chest tubes, 1 standard Arg[place] chest tube and 1 Blake drain posteriorly and the standard chest tube anteriorly through the port sites that had been used.  Estimated blood loss was less than 100 mL.  Patient was awakened and extubated in the operating room, having tolerated procedure without obvious complication and was transferred to the recovery room for further postoperative care.     Lanelle Bal, MD     EG/MEDQ  D:  01/17/2015  T:  01/18/2015  Job:  [id number]

## 2015-01-18 NOTE — Progress Notes (Addendum)
[address]       [place] [address]             [phone]      4 Days Post-Op Procedure(s) (LRB): VIDEO BRONCHOSCOPY (N/A) VIDEO ASSISTED THORACOSCOPY (VATS)/WEDGE RESECTION, Superior segmentectomy left  lower lobe, wedge resection of left upper lobe, multiple lymph node disection, On Q insertion. (Left) Subjective: conts to feel well  Objective: Vital signs in last 24 hours: Temp:  [97.6 F (36.4 C)-98.2 F (36.8 C)] 97.8 F (36.6 C) (01/08 0537) Pulse Rate:  [64-87] 67 (01/08 0537) Cardiac Rhythm:  [-] Normal sinus rhythm (01/07 1934) Resp:  [18-20] 18 (01/08 0537) BP: (100-121)/(64-74) 100/64 mmHg (01/08 0537) SpO2:  [95 %-97 %] 97 % (01/08 0537)  Hemodynamic parameters for last 24 hours:    Intake/Output from previous day: 01/07 0701 - 01/08 0700 In: 480 [P.O.:480] Out: -  Intake/Output this shift:    General appearance: alert, cooperative and no distress Heart: regular rate and rhythm Lungs: clear to auscultation bilaterally Abdomen: benign Extremities: no edema Wound: incis heaing well, minor serous drainage from CT site  Lab Results:  Recent Labs  01/16/15 0450  WBC 8.8  HGB 13.3  HCT 39.2  PLT 214   BMET:  Recent Labs  01/16/15 0450  NA 138  K 4.1  CL 109  CO2 22  GLUCOSE 99  BUN 6  CREATININE 0.68  CALCIUM 8.3*    PT/INR: No results for input(s): LABPROT, INR in the last 72 hours. ABG    Component Value Date/Time   PHART 7.496* 01/15/2015 0413   HCO3 21.0 01/15/2015 0413   TCO2 22 01/15/2015 0413   ACIDBASEDEF 1.0 01/15/2015 0413   O2SAT 93.0 01/15/2015 0413   CBG (last 3)  No results for input(s): GLUCAP in the last 72 hours.  Meds Scheduled Meds: . acetaminophen  1,000 mg Oral 4 times per day   Or  . acetaminophen (TYLENOL) oral liquid 160 mg/5 mL  1,000 mg Oral 4 times per day  . bisacodyl  10 mg Oral Daily  . Chlorhexidine Gluconate Cloth  6 each Topical Daily  . enoxaparin (LOVENOX)  injection  30 mg Subcutaneous Q24H  . feeding supplement (ENSURE ENLIVE)  237 mL Oral BID BM  . levETIRAcetam  750 mg Oral BID  . mupirocin ointment  1 application Nasal BID  . nicotine  14 mg Transdermal Daily  . senna-docusate  1 tablet Oral QHS   Continuous Infusions: . dextrose 5 % and 0.9% NaCl Stopped (01/16/15 1000)   PRN Meds:.albuterol, clonazePAM, diphenhydrAMINE, ondansetron (ZOFRAN) IV, oxyCODONE, potassium chloride, traMADol  Xrays Dg Chest Port 1 View  01/17/2015  CLINICAL DATA:  Status post chest tube removal on the left EXAM: PORTABLE CHEST - 1 VIEW COMPARISON:  01/17/2015 FINDINGS: There is been interval removal of left-sided chest tube. The patient's left pneumothorax is again seen but significantly reduced when compared with the recent exam. Cardiac shadow is stable. The right lung remains clear. IMPRESSION: Status post chest tube removal with minimal left residual pneumothorax. Electronically Signed   By: Inez Catalina M.D.   On: 01/17/2015 17:07   Dg Chest Port 1 View  01/17/2015  CLINICAL DATA:  Chest tube in place.  History of cancer, pneumonia. EXAM: PORTABLE CHEST 1 VIEW COMPARISON:  Chest x-rays dated 01/16/2015 and 01/15/2015. FINDINGS: Left-sided chest tube has retracted slightly with tip now at the level of the aortic arch. Left apical pneumothorax is slightly  increased in size compared to yesterday's exam, with pleural displacement increased from approximately 7 mm to 1.7 cm. Slightly increased subcutaneous emphysema along the left lateral chest wall. Right lung remains clear. Heart size is normal. Overall cardiomediastinal silhouette is stable in size and configuration. No midline shift. There is a slightly displaced fracture of the left posterior-lateral eighth rib. Suspect additional nondisplaced fracture of the ninth rib. IMPRESSION: 1. Left-sided chest tube slightly retracted compared to yesterday's exam, tip now at the level of the aortic knob. 2. Small left apical  pneumothorax, slightly increased in size compared to yesterday's exam. These results will be called to the ordering clinician or representative by the Radiologist Assistant, and communication documented in the PACS or zVision Dashboard. Electronically Signed   By: Franki Cabot M.D.   On: 01/17/2015 09:29    Assessment/Plan: S/P Procedure(s) (LRB): VIDEO BRONCHOSCOPY (N/A) VIDEO ASSISTED THORACOSCOPY (VATS)/WEDGE RESECTION, Superior segmentectomy left  lower lobe, wedge resection of left upper lobe, multiple lymph node disection, On Q insertion. (Left)  1 conts to do well, CXR is stable 2 OK to D/C home today    LOS: 4 days    GOLD,WAYNE E 01/18/2015  I have seen and examined the patient and agree with the assessment and plan as outlined.  Rexene Alberts, MD 01/18/2015 10:54 AM

## 2015-01-18 NOTE — Progress Notes (Signed)
Utilization review completed.  

## 2015-01-19 ENCOUNTER — Other Ambulatory Visit: Payer: Self-pay | Admitting: *Deleted

## 2015-01-19 DIAGNOSIS — G8918 Other acute postprocedural pain: Secondary | ICD-10-CM

## 2015-01-19 MED ORDER — OXYCODONE HCL 5 MG PO TABS: 5.0000 mg | ORAL_TABLET | Freq: Four times a day (QID) | ORAL | Status: DC | PRN

## 2015-01-21 ENCOUNTER — Encounter (HOSPITAL_COMMUNITY): Payer: Self-pay

## 2015-01-26 ENCOUNTER — Ambulatory Visit
Admission: RE | Admit: 2015-01-26 | Discharge: 2015-01-26 | Disposition: A | Discharge status: Home / Self Care | Payer: BLUE CROSS/BLUE SHIELD | Source: Ambulatory Visit | Attending: Radiation Oncology | Admitting: Radiation Oncology

## 2015-01-26 ENCOUNTER — Ambulatory Visit (INDEPENDENT_AMBULATORY_CARE_PROVIDER_SITE_OTHER): Payer: Self-pay | Admitting: *Deleted

## 2015-01-26 ENCOUNTER — Other Ambulatory Visit: Payer: Self-pay | Admitting: *Deleted

## 2015-01-26 ENCOUNTER — Telehealth: Payer: Self-pay | Admitting: Radiation Oncology

## 2015-01-26 ENCOUNTER — Encounter: Payer: Self-pay | Admitting: Radiation Oncology

## 2015-01-26 VITALS — BP 133/84 | HR 76 | Resp 16 | Wt 155.8 lb

## 2015-01-26 DIAGNOSIS — C7931 Secondary malignant neoplasm of brain: Secondary | ICD-10-CM

## 2015-01-26 DIAGNOSIS — Z4802 Encounter for removal of sutures: Secondary | ICD-10-CM

## 2015-01-26 DIAGNOSIS — G8918 Other acute postprocedural pain: Secondary | ICD-10-CM

## 2015-01-26 DIAGNOSIS — C3432 Malignant neoplasm of lower lobe, left bronchus or lung: Secondary | ICD-10-CM

## 2015-01-26 MED ORDER — OXYCODONE HCL 5 MG PO TABS: 5.0000 mg | ORAL_TABLET | Freq: Four times a day (QID) | ORAL | Status: DC | PRN

## 2015-01-26 NOTE — Progress Notes (Signed)
Mr. Morss returns for suture removal from 2 previous chest tube sites s/p L VATS, LLLobe Segmentectomy.  The sutures were easily removed.  The sites are a little red but I feel due to suture irritation. Polysporin and bad aides were applied for today. The mini thoracotomy incision is very well healed with glue remaining. A new Oxycodone script was given.  I suggested alternating with NSAIDS for better pain control. Appetite and bowels are good.  He will return as scheduled with a chest xray.

## 2015-01-26 NOTE — Telephone Encounter (Signed)
Patient reports he went to [facility] thinking his appointment was there. Patient on his way to [place].

## 2015-01-26 NOTE — Progress Notes (Signed)
One month follow up s/p SRS. Accompanied by brother. Weight and vitals stable. Denies pain. Reports he took a roxicodone prior to appointment for dull ache on left side s/p lobectomy. Denies headache, dizziness, nausea, vomiting, or diplopia. Reports occasional ringing in the ears. Denies confusion or poor short term memory. Reports he is cautious while ambulating but, mostly steady on his feel. Lobectomy incisions well approximated without redness, drainage or edema. Schedule to have sutures removed today. Scalp incision healed without redness, drainage or edema. Scheduled to follow up with [facility] on 1/18 and [facility] on 1/26.  BP 133/84 mmHg  Pulse 76  Resp 16  Wt 155 lb 12.8 oz (70.67 kg)  SpO2 100% Wt Readings from Last 3 Encounters:  01/26/15 155 lb 12.8 oz (70.67 kg)  01/16/15 156 lb 4.9 oz (70.9 kg)  01/01/15 166 lb 3.2 oz (75.388 kg)

## 2015-01-26 NOTE — Progress Notes (Signed)
Radiation Oncology         ([phone]) [phone] ________________________________  Name: Timothy Obrien MRN: [id number]  Date: 01/26/2015  DOB: 03/26/1960  Follow-Up Visit Note  CC: Timothy Miyamoto, MD  Timothy Obrien, *  Diagnosis:   55 y.o. 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 Brain metastases (HCC) 198.3 C79.31     Interval Since Last Radiation:  1  months; 12/25/2014  Narrative:  The patient returns today for routine follow-up.  One month follow up s/p SRS. Accompanied by brother. Weight and vitals stable. Denies pain. Reports he took a roxicodone prior to appointment for dull ache on left side s/p lobectomy. Denies headache, dizziness, nausea, vomiting, or diplopia. Reports occasional ringing in the ears. Denies confusion or poor short term memory. Reports he is cautious while ambulating but, mostly steady on his feet. After completing radiation, the patient underwent surgical resection of his primary lung cancer on 01/14/15. The adenocarcinoma was 3.5  cm and involved the upper and lower left lung. The surgical margin was positive at the edge of the lower lobe. The patient had 5 lymph nodes sampled and all were negative. Lobectomy incisions well approximated without redness, drainage or edema. Schedule to have sutures removed today. Scalp incision healed without redness, drainage or edema. Scheduled to follow up with [facility] on 1/18 and [facility] on 1/26                              ALLERGIES:  has No Known Allergies.  Meds: Current Outpatient Prescriptions  Medication Sig Dispense Refill  . levETIRAcetam (KEPPRA) 750 MG tablet Take 1 tablet (750 mg total) by mouth 2 (two) times daily. 60 tablet 0  . oxyCODONE (OXY IR/ROXICODONE) 5 MG immediate release tablet Take 1-2 tablets (5-10 mg total) by mouth every 6 (six) hours as needed for  moderate pain or severe pain. 50 tablet 0  . clonazePAM (KLONOPIN) 0.5 MG tablet Take 1 tablet (0.5 mg total) by mouth 3 (three) times daily as needed for anxiety. (Patient not taking: Reported on 01/26/2015) 15 tablet 0  . HYDROcodone-acetaminophen (NORCO) 7.5-325 MG tablet Reported on 01/26/2015  0  . mupirocin ointment (BACTROBAN) 2 % Place 1 application into the nose 2 (two) times daily. (Patient not taking: Reported on 01/26/2015) 15 g 0  . pantoprazole (PROTONIX) 20 MG tablet Take 1 tablet (20 mg total) by mouth daily. (Patient not taking: Reported on 01/26/2015) 30 tablet 0   No current facility-administered medications for this encounter.    Physical Findings: The patient is in no acute distress. Patient is alert and oriented.  weight is 155 lb 12.8 oz (70.67 kg). His blood pressure is 133/84 and his pulse is 76. His respiration is 16 and oxygen saturation is 100%. .  No significant changes. Crani incision well healed.  Lab Findings: Lab Results  Component Value Date   WBC 8.8 01/16/2015   WBC 13.3* 01/01/2015   HGB 13.3 01/16/2015   HGB 14.2 01/01/2015   HCT 39.2 01/16/2015   HCT 42.4 01/01/2015   PLT 214 01/16/2015   PLT 266 01/01/2015    Lab Results  Component Value Date   NA 138 01/16/2015   NA 132* 01/01/2015   K 4.1 01/16/2015   K 3.8 01/01/2015  CHLORIDE 102 01/01/2015   CO2 22 01/16/2015   CO2 18* 01/01/2015   GLUCOSE 99 01/16/2015   GLUCOSE 117 01/01/2015   BUN 6 01/16/2015   BUN 17.5 01/01/2015   CREATININE 0.68 01/16/2015   CREATININE 0.8 01/01/2015   BILITOT 0.9 01/16/2015   BILITOT 0.50 01/01/2015   ALKPHOS 60 01/16/2015   ALKPHOS 59 01/01/2015   AST 31 01/16/2015   AST 19 01/01/2015   ALT 26 01/16/2015   ALT 24 01/01/2015   PROT 5.6* 01/16/2015   PROT 6.0* 01/01/2015   ALBUMIN 2.7* 01/16/2015   ALBUMIN 3.0* 01/01/2015   CALCIUM 8.3* 01/16/2015   CALCIUM 8.4 01/01/2015   ANIONGAP 7 01/16/2015   ANIONGAP 12* 01/01/2015    Radiographic  Findings: Dg Chest 2 View  01/18/2015  CLINICAL DATA:  Left lung surgery on January 4th. History of lung cancer. EXAM: CHEST  2 VIEW COMPARISON:  Chest x-rays dated 01/17/2015 and 01/16/2015. FINDINGS: Left-sided chest tube was removed prior to the most recent prior exam of 01/17/2015. There is a small persistent pneumothorax at the left lung apex, unchanged. The amount of subcutaneous emphysema overlying the left chest wall and left neck is unchanged. Heart size is normal. Overall cardiomediastinal silhouette is stable in size and configuration. Right lung remains clear. No new lung findings. IMPRESSION: No interval change.  Stable small left apical pneumothorax. Electronically Signed   By: Franki Cabot M.D.   On: 01/18/2015 09:16   Dg Chest 2 View  01/14/2015  CLINICAL DATA:  Preoperative examination for patient with a lung mass. EXAM: CHEST  2 VIEW COMPARISON:  PET CT scan 12/24/2014. CT chest 12/13/2014 and single view of the chest 12/16/2014. FINDINGS: Mass in the superior segment of the left lower lobe is again identified. The lungs are otherwise clear. Heart size is normal. No pneumothorax or pleural effusion. Emphysematous change is noted. IMPRESSION: No acute disease. Mass in the superior segment of the left lower lobe as seen on prior exams. Emphysema. Electronically Signed   By: Inge Rise M.D.   On: 01/14/2015 07:22   Dg Chest Port 1 View  01/17/2015  CLINICAL DATA:  Status post chest tube removal on the left EXAM: PORTABLE CHEST - 1 VIEW COMPARISON:  01/17/2015 FINDINGS: There is been interval removal of left-sided chest tube. The patient's left pneumothorax is again seen but significantly reduced when compared with the recent exam. Cardiac shadow is stable. The right lung remains clear. IMPRESSION: Status post chest tube removal with minimal left residual pneumothorax. Electronically Signed   By: Inez Catalina M.D.   On: 01/17/2015 17:07   Dg Chest Port 1 View  01/17/2015  CLINICAL DATA:   Chest tube in place.  History of cancer, pneumonia. EXAM: PORTABLE CHEST 1 VIEW COMPARISON:  Chest x-rays dated 01/16/2015 and 01/15/2015. FINDINGS: Left-sided chest tube has retracted slightly with tip now at the level of the aortic arch. Left apical pneumothorax is slightly increased in size compared to yesterday's exam, with pleural displacement increased from approximately 7 mm to 1.7 cm. Slightly increased subcutaneous emphysema along the left lateral chest wall. Right lung remains clear. Heart size is normal. Overall cardiomediastinal silhouette is stable in size and configuration. No midline shift. There is a slightly displaced fracture of the left posterior-lateral eighth rib. Suspect additional nondisplaced fracture of the ninth rib. IMPRESSION: 1. Left-sided chest tube slightly retracted compared to yesterday's exam, tip now at the level of the aortic knob. 2. Small left apical pneumothorax, slightly increased in  size compared to yesterday's exam. These results will be called to the ordering clinician or representative by the Radiologist Assistant, and communication documented in the PACS or zVision Dashboard. Electronically Signed   By: Franki Cabot M.D.   On: 01/17/2015 09:29   Dg Chest Port 1 View  01/16/2015  CLINICAL DATA:  Bronchoscopy.  Wedge resection. EXAM: PORTABLE CHEST 1 VIEW COMPARISON:  01/15/2015. FINDINGS: Left IJ line, left chest tubes in stable position. Tiny left apical pneumothorax noted on today's exam. Partial clearing of basilar atelectasis. Heart size stable. IMPRESSION: 1. Two left chest tubes in stable position. Left IJ line stable position. 2. Tiny left apical pneumothorax noted on today's exam. 3. Partial clearing of bibasilar atelectasis . Critical Value/emergent results were called by telephone at the time of interpretation on 01/16/2015 at 7:51 am to [place], who verbally acknowledged these results. Electronically Signed   By: Marcello Moores  Register   On: 01/16/2015 07:53    Dg Chest Port 1 View  01/15/2015  CLINICAL DATA:  Left lung surgery. EXAM: PORTABLE CHEST 1 VIEW COMPARISON:  01/14/2015. FINDINGS: Two left chest tubes and left IJ line in stable position. No pneumothorax. Interim near complete clearing of left perihilar infiltrate. Cardiomegaly with partial clearing of interstitial prominence suggesting clearing congestive heart failure. No prominent pleural effusion. Left chest wall subcutaneous emphysema is stable. IMPRESSION: 1. 2 left chest tubes and left IJ line in stable position. No pneumothorax. 2. Interim near complete clearing of left perihilar infiltrate. 3. Cardiomegaly with interim partial clearing of interstitial prominence consistent with clearing congestive heart failure. Electronically Signed   By: Marcello Moores  Register   On: 01/15/2015 07:36   Dg Chest Port 1 View  01/14/2015  CLINICAL DATA:  Lung cancer, status post VATS EXAM: PORTABLE CHEST 1 VIEW COMPARISON:  01/14/2015 FINDINGS: Cardiomediastinal silhouette is stable. Left IJ central line with tip in SVC. Mild perihilar interstitial prominence suspicious for mild interstitial edema. There is worsening atelectasis or infiltrate left perihilar and left suprahilar region to left side chest tubes are noted. There is no pneumothorax. IMPRESSION: Mild perihilar interstitial prominence suspicious for mild interstitial edema. Worsening left perihilar and suprahilar atelectasis or infiltrate. Two left side chest tube in place. No pneumothorax. Electronically Signed   By: Lahoma Crocker M.D.   On: 01/14/2015 15:35    Impression:  The patient is recovering from the effects of radiation. Increased risk for local recurrence because of positive margin. May benefit from adjuvant radiation treatment. Also, will consider chemotherapy in the future. Positive PDL1 (100%)  indicates that the patient will respond well to immunotherapy in the future.  Plan:  The patient sees Dr. Julien Nordmann back tomorrow.  We will await his input.   If the patient gets 4 cycles of chemotherapy, this could be followed by radiation to the positive margin, reserving, immunotherapy in the future. MRI every 3 months.   _____________________________________  Sheral Apley. Tammi Klippel, M.D.   This document serves as a record of services personally performed by Tyler Pita, MD. It was created on his behalf by Jenell Milliner, a trained medical scribe. The creation of this record is based on the scribe's personal observations and the provider's statements to them. This document has been checked and approved by the attending provider.

## 2015-01-28 ENCOUNTER — Telehealth: Payer: Self-pay | Admitting: Internal Medicine

## 2015-01-28 ENCOUNTER — Other Ambulatory Visit: Payer: Self-pay | Admitting: Medical Oncology

## 2015-01-28 ENCOUNTER — Ambulatory Visit (HOSPITAL_BASED_OUTPATIENT_CLINIC_OR_DEPARTMENT_OTHER): Payer: BLUE CROSS/BLUE SHIELD | Admitting: Internal Medicine

## 2015-01-28 ENCOUNTER — Other Ambulatory Visit (HOSPITAL_BASED_OUTPATIENT_CLINIC_OR_DEPARTMENT_OTHER): Payer: BLUE CROSS/BLUE SHIELD

## 2015-01-28 ENCOUNTER — Encounter: Payer: Self-pay | Admitting: Pharmacist

## 2015-01-28 ENCOUNTER — Encounter: Payer: Self-pay | Admitting: Internal Medicine

## 2015-01-28 VITALS — BP 115/70 | HR 84 | Temp 97.7°F | Resp 18 | Ht 72.0 in | Wt 155.6 lb

## 2015-01-28 DIAGNOSIS — Z23 Encounter for immunization: Secondary | ICD-10-CM

## 2015-01-28 DIAGNOSIS — F329 Major depressive disorder, single episode, unspecified: Secondary | ICD-10-CM | POA: Diagnosis not present

## 2015-01-28 DIAGNOSIS — C7931 Secondary malignant neoplasm of brain: Secondary | ICD-10-CM | POA: Diagnosis not present

## 2015-01-28 DIAGNOSIS — C3432 Malignant neoplasm of lower lobe, left bronchus or lung: Secondary | ICD-10-CM

## 2015-01-28 LAB — AFB CULTURE WITH SMEAR (NOT AT ARMC): ACID FAST SMEAR: NONE SEEN

## 2015-01-28 LAB — COMPREHENSIVE METABOLIC PANEL
ALBUMIN: 2.9 g/dL — AB (ref 3.5–5.0)
ALK PHOS: 96 U/L (ref 40–150)
ALT: 36 U/L (ref 0–55)
AST: 32 U/L (ref 5–34)
Anion Gap: 9 mEq/L (ref 3–11)
BUN: 8.8 mg/dL (ref 7.0–26.0)
CALCIUM: 9 mg/dL (ref 8.4–10.4)
CO2: 24 mEq/L (ref 22–29)
Chloride: 107 mEq/L (ref 98–109)
Creatinine: 0.8 mg/dL (ref 0.7–1.3)
GLUCOSE: 96 mg/dL (ref 70–140)
Potassium: 3.9 mEq/L (ref 3.5–5.1)
SODIUM: 140 meq/L (ref 136–145)
TOTAL PROTEIN: 6.4 g/dL (ref 6.4–8.3)

## 2015-01-28 LAB — CBC WITH DIFFERENTIAL/PLATELET
BASO%: 1.2 % (ref 0.0–2.0)
BASOS ABS: 0.1 10*3/uL (ref 0.0–0.1)
EOS ABS: 0.2 10*3/uL (ref 0.0–0.5)
EOS%: 1.7 % (ref 0.0–7.0)
HEMATOCRIT: 38.5 % (ref 38.4–49.9)
HEMOGLOBIN: 13.3 g/dL (ref 13.0–17.1)
LYMPH#: 2.3 10*3/uL (ref 0.9–3.3)
LYMPH%: 22 % (ref 14.0–49.0)
MCH: 33.5 pg — AB (ref 27.2–33.4)
MCHC: 34.5 g/dL (ref 32.0–36.0)
MCV: 97 fL (ref 79.3–98.0)
MONO#: 1.7 10*3/uL — AB (ref 0.1–0.9)
MONO%: 15.5 % — AB (ref 0.0–14.0)
NEUT%: 59.6 % (ref 39.0–75.0)
NEUTROS ABS: 6.3 10*3/uL (ref 1.5–6.5)
Platelets: 470 10*3/uL — ABNORMAL HIGH (ref 140–400)
RBC: 3.97 10*6/uL — ABNORMAL LOW (ref 4.20–5.82)
RDW: 13.5 % (ref 11.0–14.6)
WBC: 10.6 10*3/uL — AB (ref 4.0–10.3)

## 2015-01-28 LAB — TECHNOLOGIST REVIEW

## 2015-01-28 MED ORDER — INFLUENZA VAC SPLIT QUAD 0.5 ML IM SUSY
0.5000 mL | PREFILLED_SYRINGE | Freq: Once | INTRAMUSCULAR | Status: DC
  Filled 2015-01-28: qty 0.5

## 2015-01-28 MED ORDER — MIRTAZAPINE 30 MG PO TABS: 30.0000 mg | ORAL_TABLET | Freq: Every day | ORAL | Status: DC

## 2015-01-28 NOTE — Telephone Encounter (Signed)
Gave patient avs report and appointments for Feb thru April.

## 2015-01-28 NOTE — Progress Notes (Signed)
Chewing nicorette gum , has nicoderm patches , but has not started them because he does not get instant gratification like he does with gum.

## 2015-01-28 NOTE — Patient Instructions (Signed)
Smoking Cessation, Tips for Success If you are ready to quit smoking, congratulations! You have chosen to help yourself be healthier. Cigarettes bring nicotine, tar, carbon monoxide, and other irritants into your body. Your lungs, heart, and blood vessels will be able to work better without these poisons. There are many different ways to quit smoking. Nicotine gum, nicotine patches, a nicotine inhaler, or nicotine nasal spray can help with physical craving. Hypnosis, support groups, and medicines help break the habit of smoking. WHAT THINGS CAN I DO TO MAKE QUITTING EASIER?  Here are some tips to help you quit for good:  Pick a date when you will quit smoking completely. Tell all of your friends and family about your plan to quit on that date.  Do not try to slowly cut down on the number of cigarettes you are smoking. Pick a quit date and quit smoking completely starting on that day.  Throw away all cigarettes.   Clean and remove all ashtrays from your home, work, and car.  On a card, write down your reasons for quitting. Carry the card with you and read it when you get the urge to smoke.  Cleanse your body of nicotine. Drink enough water and fluids to keep your urine clear or pale yellow. Do this after quitting to flush the nicotine from your body.  Learn to predict your moods. Do not let a bad situation be your excuse to have a cigarette. Some situations in your life might tempt you into wanting a cigarette.  Never have "just one" cigarette. It leads to wanting another and another. Remind yourself of your decision to quit.  Change habits associated with smoking. If you smoked while driving or when feeling stressed, try other activities to replace smoking. Stand up when drinking your coffee. Brush your teeth after eating. Sit in a different chair when you read the paper. Avoid alcohol while trying to quit, and try to drink fewer caffeinated beverages. Alcohol and caffeine may urge you to  smoke.  Avoid foods and drinks that can trigger a desire to smoke, such as sugary or spicy foods and alcohol.  Ask people who smoke not to smoke around you.  Have something planned to do right after eating or having a cup of coffee. For example, plan to take a walk or exercise.  Try a relaxation exercise to calm you down and decrease your stress. Remember, you may be tense and nervous for the first 2 weeks after you quit, but this will pass.  Find new activities to keep your hands busy. Play with a pen, coin, or rubber band. Doodle or draw things on paper.  Brush your teeth right after eating. This will help cut down on the craving for the taste of tobacco after meals. You can also try mouthwash.   Use oral substitutes in place of cigarettes. Try using lemon drops, carrots, cinnamon sticks, or chewing gum. Keep them handy so they are available when you have the urge to smoke.  When you have the urge to smoke, try deep breathing.  Designate your home as a nonsmoking area.  If you are a heavy smoker, ask your health care provider about a prescription for nicotine chewing gum. It can ease your withdrawal from nicotine.  Reward yourself. Set aside the cigarette money you save and buy yourself something nice.  Look for support from others. Join a support group or smoking cessation program. Ask someone at home or at work to help you with your plan   to quit smoking.  Always ask yourself, "Do I need this cigarette or is this just a reflex?" Tell yourself, "Today, I choose not to smoke," or "I do not want to smoke." You are reminding yourself of your decision to quit.  Do not replace cigarette smoking with electronic cigarettes (commonly called e-cigarettes). The safety of e-cigarettes is unknown, and some may contain harmful chemicals.  If you relapse, do not give up! Plan ahead and think about what you will do the next time you get the urge to smoke. HOW WILL I FEEL WHEN I QUIT SMOKING? You  may have symptoms of withdrawal because your body is used to nicotine (the addictive substance in cigarettes). You may crave cigarettes, be irritable, feel very hungry, cough often, get headaches, or have difficulty concentrating. The withdrawal symptoms are only temporary. They are strongest when you first quit but will go away within 10-14 days. When withdrawal symptoms occur, stay in control. Think about your reasons for quitting. Remind yourself that these are signs that your body is healing and getting used to being without cigarettes. Remember that withdrawal symptoms are easier to treat than the major diseases that smoking can cause.  Even after the withdrawal is over, expect periodic urges to smoke. However, these cravings are generally short lived and will go away whether you smoke or not. Do not smoke! WHAT RESOURCES ARE AVAILABLE TO HELP ME QUIT SMOKING? Your health care provider can direct you to community resources or hospitals for support, which may include:  Group support.  Education.  Hypnosis.  Therapy.   This information is not intended to replace advice given to you by your health care provider. Make sure you discuss any questions you have with your health care provider.   Document Released: 09/25/2003 Document Revised: 01/17/2014 Document Reviewed: 06/14/2012 Elsevier Interactive Patient Education 2016 [employer]  

## 2015-01-28 NOTE — Progress Notes (Signed)
[place] [phone]   [phone]  OFFICE PROGRESS NOTE  FRIED, [name], MD [address] [place] [address]  DIAGNOSIS: Stage IV (T2a, N1, M1b) non-small cell lung cancer, adenocarcinoma, negative EGFR mutation but equivocal EGFR amplification, negative ALK gene translocation and negative ROS 1but with PDL-1 expression 100% presented with left lower lobe lung mass in addition to left hilar adenopathy and solitary metastatic brain lesion diagnosed in December 2016.  PRIOR THERAPY:  1) status post stereotactic radiotherapy and surgical resection diagnosed in December 2016. 2) status post video bronchoscopy with left VATS and wedge resection of the left upper lobe and left lower lobe superior segmentectomy with lymph node dissection under the care of Dr. Servando Snare on 01/14/2015.  CURRENT THERAPY: Immunotherapy with [name] (pembrolizumab) 200 MG IV every 3 weeks, first dose 02/11/2015  INTERVAL HISTORY: Timothy Obrien 55 y.o. male returns to the clinic today for follow-up visit accompanied by his ex-wife. The patient is recovering slowly from the recent thoracic surgery. He continues to have fatigue and pain on the left side of the chest. He has shortness breath with exertion. He denied having any significant fever or chills. He recently underwent a video bronchoscopy with left VATS and wedge resection of the left upper lobe and left lower lobe superior segmentectomy with lymph node dissection under the care of Dr. Servando Snare on 01/14/2015. The final pathology (Accession: [id number]) showed invasive adenocarcinoma, poorly differentiated spanning 3.5 cm with carcinoma present at the lower lobe resection margin and there was adenocarcinoma involving the pleura with lymphovascular invasion. There was also evidence of metastatic carcinoma in 10L lymph node. The patient denied having any current nausea or vomiting. No significant weight loss or night  sweats. He is here today for evaluation and discussion of his treatment options.  MEDICAL HISTORY: Past Medical History  Diagnosis Date  . Lung cancer ([place])     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  . Pneumonia   . Bronchitis   . Tobacco abuse   . Seizures ([place])     last 12/11/14  . Shortness of breath dyspnea     with exertion    ALLERGIES:  has No Known Allergies.  MEDICATIONS:  Current Outpatient Prescriptions  Medication Sig Dispense Refill  . clonazePAM (KLONOPIN) 0.5 MG tablet Take 1 tablet (0.5 mg total) by mouth 3 (three) times daily as needed for anxiety. (Patient not taking: Reported on 01/26/2015) 15 tablet 0  . levETIRAcetam (KEPPRA) 750 MG tablet Take 1 tablet (750 mg total) by mouth 2 (two) times daily. 60 tablet 0  . oxyCODONE (OXY IR/ROXICODONE) 5 MG immediate release tablet Take 1-2 tablets (5-10 mg total) by mouth every 6 (six) hours as needed for moderate pain or severe pain. 40 tablet 0   No current facility-administered medications for this visit.    SURGICAL HISTORY:  Past Surgical History  Procedure Laterality Date  . Hernia repair    . Vasectomy    . Video bronchoscopy Bilateral 12/16/2014    Procedure: VIDEO BRONCHOSCOPY WITH FLUORO;  Surgeon: Collene Gobble, MD;  Location: [facility];  Service: Cardiopulmonary;  Laterality: Bilateral;  . Craniotomy N/A 12/26/2014    Procedure: CRANIOTOMY TUMOR EXCISION with [facility];  Surgeon: Kevan Ny Ditty, MD;  Location: [facility];  Service: Neurosurgery;  Laterality: N/A;  CRANIOTOMY TUMOR EXCISION with [facility]  . Application of cranial navigation N/A 12/26/2014    Procedure: APPLICATION OF CRANIAL  NAVIGATION;  Surgeon: Loura Halt Ditty, MD;  Location: [facility];  Service: Neurosurgery;  Laterality: N/A;  . Video bronchoscopy N/A 01/14/2015    Procedure: VIDEO BRONCHOSCOPY;  Surgeon: Delight Ovens, MD;  Location: [facility] OR;  Service: Thoracic;  Laterality: N/A;  . Video assisted thoracoscopy  (vats)/wedge resection Left 01/14/2015    Procedure: VIDEO ASSISTED THORACOSCOPY (VATS)/WEDGE RESECTION, Superior segmentectomy left  lower lobe, wedge resection of left upper lobe, multiple lymph node disection, On Q insertion.;  Surgeon: Delight Ovens, MD;  Location: [facility] OR;  Service: Thoracic;  Laterality: Left;    REVIEW OF SYSTEMS:  Constitutional: positive for fatigue Eyes: negative Ears, nose, mouth, throat, and face: negative Respiratory: positive for dyspnea on exertion and pleurisy/chest pain Cardiovascular: negative Gastrointestinal: negative Genitourinary:negative Integument/breast: negative Hematologic/lymphatic: negative Musculoskeletal:negative Neurological: negative Behavioral/Psych: negative Endocrine: negative Allergic/Immunologic: negative   PHYSICAL EXAMINATION: General appearance: alert, cooperative, fatigued and no distress Head: Normocephalic, without obvious abnormality, atraumatic Neck: no adenopathy, no JVD, supple, symmetrical, trachea midline and thyroid not enlarged, symmetric, no tenderness/mass/nodules Lymph nodes: Cervical, supraclavicular, and axillary nodes normal. Resp: clear to auscultation bilaterally Back: symmetric, no curvature. ROM normal. No CVA tenderness. Cardio: regular rate and rhythm, S1, S2 normal, no murmur, click, rub or gallop GI: soft, non-tender; bowel sounds normal; no masses,  no organomegaly Extremities: extremities normal, atraumatic, no cyanosis or edema Neurologic: Alert and oriented X 3, normal strength and tone. Normal symmetric reflexes. Normal coordination and gait  ECOG PERFORMANCE STATUS: 1 - Symptomatic but completely ambulatory  Blood pressure 115/70, pulse 84, temperature 97.7 F (36.5 C), temperature source Oral, resp. rate 18, height 6' (1.829 m), weight 155 lb 9.6 oz (70.58 kg), SpO2 98 %.  LABORATORY DATA: Lab Results  Component Value Date   WBC 10.6* 01/28/2015   HGB 13.3 01/28/2015   HCT 38.5 01/28/2015    MCV 97.0 01/28/2015   PLT 470* 01/28/2015      Chemistry      Component Value Date/Time   NA 140 01/28/2015 1200   NA 138 01/16/2015 0450   K 3.9 01/28/2015 1200   K 4.1 01/16/2015 0450   CL 109 01/16/2015 0450   CO2 24 01/28/2015 1200   CO2 22 01/16/2015 0450   BUN 8.8 01/28/2015 1200   BUN 6 01/16/2015 0450   CREATININE 0.8 01/28/2015 1200   CREATININE 0.68 01/16/2015 0450      Component Value Date/Time   CALCIUM 9.0 01/28/2015 1200   CALCIUM 8.3* 01/16/2015 0450   ALKPHOS 96 01/28/2015 1200   ALKPHOS 60 01/16/2015 0450   AST 32 01/28/2015 1200   AST 31 01/16/2015 0450   ALT 36 01/28/2015 1200   ALT 26 01/16/2015 0450   BILITOT <0.30 01/28/2015 1200   BILITOT 0.9 01/16/2015 0450       RADIOGRAPHIC STUDIES: Dg Chest 2 View  01/18/2015  CLINICAL DATA:  Left lung surgery on January 4th. History of lung cancer. EXAM: CHEST  2 VIEW COMPARISON:  Chest x-rays dated 01/17/2015 and 01/16/2015. FINDINGS: Left-sided chest tube was removed prior to the most recent prior exam of 01/17/2015. There is a small persistent pneumothorax at the left lung apex, unchanged. The amount of subcutaneous emphysema overlying the left chest wall and left neck is unchanged. Heart size is normal. Overall cardiomediastinal silhouette is stable in size and configuration. Right lung remains clear. No new lung findings. IMPRESSION: No interval change.  Stable small left apical pneumothorax. Electronically Signed   By: Anne Ng.D.  On: 01/18/2015 09:16   Dg Chest 2 View  01/14/2015  CLINICAL DATA:  Preoperative examination for patient with a lung mass. EXAM: CHEST  2 VIEW COMPARISON:  PET CT scan 12/24/2014. CT chest 12/13/2014 and single view of the chest 12/16/2014. FINDINGS: Mass in the superior segment of the left lower lobe is again identified. The lungs are otherwise clear. Heart size is normal. No pneumothorax or pleural effusion. Emphysematous change is noted. IMPRESSION: No acute disease. Mass  in the superior segment of the left lower lobe as seen on prior exams. Emphysema. Electronically Signed   By: Inge Rise M.D.   On: 01/14/2015 07:22   Dg Chest Port 1 View  01/17/2015  CLINICAL DATA:  Status post chest tube removal on the left EXAM: PORTABLE CHEST - 1 VIEW COMPARISON:  01/17/2015 FINDINGS: There is been interval removal of left-sided chest tube. The patient's left pneumothorax is again seen but significantly reduced when compared with the recent exam. Cardiac shadow is stable. The right lung remains clear. IMPRESSION: Status post chest tube removal with minimal left residual pneumothorax. Electronically Signed   By: Inez Catalina M.D.   On: 01/17/2015 17:07   Dg Chest Port 1 View  01/17/2015  CLINICAL DATA:  Chest tube in place.  History of cancer, pneumonia. EXAM: PORTABLE CHEST 1 VIEW COMPARISON:  Chest x-rays dated 01/16/2015 and 01/15/2015. FINDINGS: Left-sided chest tube has retracted slightly with tip now at the level of the aortic arch. Left apical pneumothorax is slightly increased in size compared to yesterday's exam, with pleural displacement increased from approximately 7 mm to 1.7 cm. Slightly increased subcutaneous emphysema along the left lateral chest wall. Right lung remains clear. Heart size is normal. Overall cardiomediastinal silhouette is stable in size and configuration. No midline shift. There is a slightly displaced fracture of the left posterior-lateral eighth rib. Suspect additional nondisplaced fracture of the ninth rib. IMPRESSION: 1. Left-sided chest tube slightly retracted compared to yesterday's exam, tip now at the level of the aortic knob. 2. Small left apical pneumothorax, slightly increased in size compared to yesterday's exam. These results will be called to the ordering clinician or representative by the Radiologist Assistant, and communication documented in the PACS or zVision Dashboard. Electronically Signed   By: Franki Cabot M.D.   On: 01/17/2015  09:29   Dg Chest Port 1 View  01/16/2015  CLINICAL DATA:  Bronchoscopy.  Wedge resection. EXAM: PORTABLE CHEST 1 VIEW COMPARISON:  01/15/2015. FINDINGS: Left IJ line, left chest tubes in stable position. Tiny left apical pneumothorax noted on today's exam. Partial clearing of basilar atelectasis. Heart size stable. IMPRESSION: 1. Two left chest tubes in stable position. Left IJ line stable position. 2. Tiny left apical pneumothorax noted on today's exam. 3. Partial clearing of bibasilar atelectasis . Critical Value/emergent results were called by telephone at the time of interpretation on 01/16/2015 at 7:51 am to Canal [place], who verbally acknowledged these results. Electronically Signed   By: Marcello Moores  Register   On: 01/16/2015 07:53   Dg Chest Port 1 View  01/15/2015  CLINICAL DATA:  Left lung surgery. EXAM: PORTABLE CHEST 1 VIEW COMPARISON:  01/14/2015. FINDINGS: Two left chest tubes and left IJ line in stable position. No pneumothorax. Interim near complete clearing of left perihilar infiltrate. Cardiomegaly with partial clearing of interstitial prominence suggesting clearing congestive heart failure. No prominent pleural effusion. Left chest wall subcutaneous emphysema is stable. IMPRESSION: 1. 2 left chest tubes and left IJ line in stable position. No  pneumothorax. 2. Interim near complete clearing of left perihilar infiltrate. 3. Cardiomegaly with interim partial clearing of interstitial prominence consistent with clearing congestive heart failure. Electronically Signed   By: Marcello Moores  Register   On: 01/15/2015 07:36   Dg Chest Port 1 View  01/14/2015  CLINICAL DATA:  Lung cancer, status post VATS EXAM: PORTABLE CHEST 1 VIEW COMPARISON:  01/14/2015 FINDINGS: Cardiomediastinal silhouette is stable. Left IJ central line with tip in SVC. Mild perihilar interstitial prominence suspicious for mild interstitial edema. There is worsening atelectasis or infiltrate left perihilar and left suprahilar region to left  side chest tubes are noted. There is no pneumothorax. IMPRESSION: Mild perihilar interstitial prominence suspicious for mild interstitial edema. Worsening left perihilar and suprahilar atelectasis or infiltrate. Two left side chest tube in place. No pneumothorax. Electronically Signed   By: Lahoma Crocker M.D.   On: 01/14/2015 15:35    ASSESSMENT AND PLAN: This is a very pleasant 55 years old white male diagnosed with a stage IV non-small cell lung cancer, T2a, N1, M1b) non-small cell lung cancer presented with solitary brain metastasis status post stereotactic radiotherapy followed by craniotomy and resection of the brain tumor. The patient also underwent wedge resection of the left upper lobe as well as left lower lobe superior segmentectomy with lymph node dissection. Unfortunately the left lower lobe resection margin was positive for adenocarcinoma. His molecular study showed equivocal EGFR amplification, negative ALK gene translocation, negative Ros 1 but the immunohistochemical stains showed positive for PDL 1 expression at 100%. I had a lengthy discussion with the patient and his ex-wife today about his current disease status and treatment options. Definitely the patient would benefit from systemic therapy because of his stage IV disease. I discussed with him the option of systemic chemotherapy versus consideration of treatment with immunotherapy with Ketruda especially with PDL 1 expression at 100%. He is interested in proceeding with the immunotherapy. I will arrange for the patient to start the first dose of this treatment in 2 weeks to give him more time to recover from the recent surgery. I discussed with the patient the adverse effect of Ketruda (pembrolizumab) including but not limited to immune mediated pneumonitis, colitis, liver, renal, skin rash and endocrine dysfunction. The patient also benefit from palliative radiotherapy to the best of resection margin. I will discuss this option with Dr.  Tammi Klippel. I will arrange for the patient to have a chemotherapy education class before the first dose of his treatment. For depression, I will start the patient on Remeron 30 mg by mouth daily at bedtime. He would come back for follow-up visit in 2 weeks for reevaluation before starting the first dose of his treatment. He will receive flu shot today. The patient was advised to call if he has any concerning symptoms in the interval. The patient voices understanding of current disease status and treatment options and is in agreement with the current care plan.  All questions were answered. The patient knows to call the clinic with any problems, questions or concerns. We can certainly see the patient much sooner if necessary.  I spent 35 minutes counseling the patient face to face. The total time spent in the appointment was 45 minutes.  Disclaimer: This note was dictated with voice recognition software. Similar sounding words can inadvertently be transcribed and may not be corrected upon review.

## 2015-01-28 NOTE — Telephone Encounter (Signed)
Added flu shot to 1/24 ched class appointment per 2nd 1/18 pof - left message for patient.

## 2015-02-03 ENCOUNTER — Encounter: Payer: Self-pay | Admitting: General Practice

## 2015-02-03 ENCOUNTER — Encounter: Payer: Self-pay | Admitting: *Deleted

## 2015-02-03 ENCOUNTER — Other Ambulatory Visit: Payer: BLUE CROSS/BLUE SHIELD

## 2015-02-03 ENCOUNTER — Ambulatory Visit (HOSPITAL_BASED_OUTPATIENT_CLINIC_OR_DEPARTMENT_OTHER): Payer: BLUE CROSS/BLUE SHIELD

## 2015-02-03 VITALS — BP 128/83 | HR 72 | Temp 98.4°F

## 2015-02-03 DIAGNOSIS — Z23 Encounter for immunization: Secondary | ICD-10-CM

## 2015-02-03 MED ORDER — INFLUENZA VAC SPLIT QUAD 0.5 ML IM SUSY
0.5000 mL | PREFILLED_SYRINGE | Freq: Once | INTRAMUSCULAR | Status: AC
  Administered 2015-02-03: 0.5 mL via INTRAMUSCULAR
  Filled 2015-02-03: qty 0.5

## 2015-02-03 NOTE — Progress Notes (Signed)
Spiritual Care Note  Met pt, who goes by Timothy Obrien, in chemo class today.  He engaged readily and frankly.  Referred to SW for questions regarding disability, financial assistance.  Plan to f/u by phone to offer further support, including brain group as possibility.  [place], [place], [facility] Pager [phone] Voicemail  [phone]

## 2015-02-04 ENCOUNTER — Other Ambulatory Visit: Payer: Self-pay | Admitting: Cardiothoracic Surgery

## 2015-02-04 DIAGNOSIS — C3490 Malignant neoplasm of unspecified part of unspecified bronchus or lung: Secondary | ICD-10-CM

## 2015-02-05 ENCOUNTER — Ambulatory Visit (INDEPENDENT_AMBULATORY_CARE_PROVIDER_SITE_OTHER): Payer: Self-pay | Admitting: Cardiothoracic Surgery

## 2015-02-05 ENCOUNTER — Ambulatory Visit
Admission: RE | Admit: 2015-02-05 | Discharge: 2015-02-05 | Disposition: A | Discharge status: Home / Self Care | Payer: BLUE CROSS/BLUE SHIELD | Source: Ambulatory Visit | Attending: Cardiothoracic Surgery | Admitting: Cardiothoracic Surgery

## 2015-02-05 ENCOUNTER — Encounter: Payer: Self-pay | Admitting: Internal Medicine

## 2015-02-05 ENCOUNTER — Encounter: Payer: Self-pay | Admitting: Cardiothoracic Surgery

## 2015-02-05 VITALS — BP 141/92 | HR 69 | Resp 16 | Ht 72.0 in | Wt 155.0 lb

## 2015-02-05 DIAGNOSIS — Z09 Encounter for follow-up examination after completed treatment for conditions other than malignant neoplasm: Secondary | ICD-10-CM

## 2015-02-05 DIAGNOSIS — C3432 Malignant neoplasm of lower lobe, left bronchus or lung: Secondary | ICD-10-CM

## 2015-02-05 DIAGNOSIS — C3490 Malignant neoplasm of unspecified part of unspecified bronchus or lung: Secondary | ICD-10-CM

## 2015-02-05 NOTE — Progress Notes (Signed)
Left 2nd msg for pt to return my call.  Would like to apply to [employer] for copay assistance for [place].

## 2015-02-05 NOTE — Progress Notes (Signed)
[address]       [place] [address]             [phone]      Timothy Obrien [place] Medical Record #[id number] Date of Birth: [birth year]  Referring: Curt Bears, MD Primary Care: Abigail Miyamoto, MD  Chief Complaint:   POST OP FOLLOW UP 01/14/2015 OPERATIVE REPORT PREOPERATIVE DIAGNOSIS: Lung mass, superior segment, left lower lobe with isolated brain metastasis from adenocarcinoma. POSTOPERATIVE DIAGNOSIS: Lung mass, superior segment, left lower lobe with isolated brain metastasis from adenocarcinoma. SURGICAL PROCEDURE: Bronchoscopy, left video-assisted thoracoscopy, left superior segmentectomy with wedge resection of left upper lobe mediastinal and lymph node dissection and placement of On-Q device. SURGEON: Lanelle Bal, MD  History of Present Illness:      DIAGNOSIS: Stage IV (T2a, N1, M1b) non-small cell lung cancer, adenocarcinoma, negative EGFR mutation but equivocal EGFR amplification, negative ALK gene translocation and negative ROS 1but with PDL-1 expression 100% presented with left lower lobe lung mass in addition to left hilar adenopathy and solitary metastatic brain lesion diagnosed in December 2016.  PRIOR THERAPY:  1) status post stereotactic radiotherapy and surgical resection diagnosed in December 2016. 2) status post video bronchoscopy with left VATS and wedge resection of the left upper lobe and left lower lobe superior segmentectomy with lymph node dissection under the care of Dr. Servando Snare on 01/14/2015.  CURRENT THERAPY: Immunotherapy with Ketruda (pembrolizumab) 200 MG IV every 3 weeks, first dose 02/11/2015  Patient doing well, post op, denies any respiratory difficulty.    Past Medical History  Diagnosis Date  . Lung cancer ([place])     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  . Pneumonia   . Bronchitis   . Tobacco abuse   . Seizures ([place])     last 12/11/14  . Shortness of breath dyspnea     with  exertion     History  Smoking status  . Current Every Day Smoker -- 1.00 packs/day for 36 years  . Types: Cigarettes  . Start date: 12/14/1979  Smokeless tobacco  . Never Used    History  Alcohol Use  . 21.0 oz/week  . 35 Cans of beer, 0 Standard drinks or equivalent per week    Comment: daily 6 beers a day for the past fefw years.      No Known Allergies  Current Outpatient Prescriptions  Medication Sig Dispense Refill  . clonazePAM (KLONOPIN) 0.5 MG tablet Take 1 tablet (0.5 mg total) by mouth 3 (three) times daily as needed for anxiety. 15 tablet 0  . ibuprofen (ADVIL,MOTRIN) 200 MG tablet Take 200 mg by mouth every 6 (six) hours as needed.    . levETIRAcetam (KEPPRA) 750 MG tablet Take 1 tablet (750 mg total) by mouth 2 (two) times daily. 60 tablet 0  . mirtazapine (REMERON) 30 MG tablet Take 1 tablet (30 mg total) by mouth at bedtime. 30 tablet 2  . oxyCODONE (OXY IR/ROXICODONE) 5 MG immediate release tablet Take 1-2 tablets (5-10 mg total) by mouth every 6 (six) hours as needed for moderate pain or severe pain. 40 tablet 0   No current facility-administered medications for this visit.       Physical Exam: BP 141/92 mmHg  Pulse 69  Resp 16  Ht 6' (1.829 m)  Wt 155 lb (70.308 kg)  BMI 21.02 kg/m2  SpO2 98%  General appearance: alert and cooperative Neurologic: intact Heart: regular rate and rhythm, S1,  S2 normal, no murmur, click, rub or gallop Lungs: clear to auscultation bilaterally Abdomen: soft, non-tender; bowel sounds normal; no masses,  no organomegaly Extremities: extremities normal, atraumatic, no cyanosis or edema and Homans sign is negative, no sign of DVT Wound: chest incisions healing well   Diagnostic Studies & Laboratory data:     Recent Radiology Findings:   Dg Chest 2 View  02/05/2015  CLINICAL DATA:  Status post left VATS for lung cancer on 01/14/2015. Followup left pneumothorax. EXAM: CHEST  2 VIEW COMPARISON:  01/18/2015. FINDINGS:  Normal sized heart. Minimal linear density at the left lung base with significant improvement. Clear right lung. Mild central peribronchial thickening. The lungs remain mildly hyperexpanded. Left hilar surgical staples. The previously seen left pneumothorax is no longer demonstrated. Interval partial healing of left seventh and eighth posterolateral rib fractures. IMPRESSION: 1. Resolved left pneumothorax. 2. Small amount of linear scarring and bullous change at the left lung base. 3. Stable mild changes of COPD and chronic bronchitis. Electronically Signed   By: Claudie Revering M.D.   On: 02/05/2015 13:32      Recent Lab Findings: Lab Results  Component Value Date   WBC 10.6* 01/28/2015   HGB 13.3 01/28/2015   HCT 38.5 01/28/2015   PLT 470* 01/28/2015   GLUCOSE 96 01/28/2015   ALT 36 01/28/2015   AST 32 01/28/2015   NA 140 01/28/2015   K 3.9 01/28/2015   CL 109 01/16/2015   CREATININE 0.8 01/28/2015   BUN 8.8 01/28/2015   CO2 24 01/28/2015   TSH 0.744 12/13/2014   INR 0.94 01/14/2015      Assessment / Plan:   Stable following resection of superior segment left  and portion of left upper lobe, lower margin negative on frozen section positive on final path, n1 positive nodes.  To start  immune therapy per Dr Julien Nordmann  Return with chest xray 2 months        Grace Isaac MD      [address] [place] [address] Office [phone]   [place]eeper [phone]  02/05/2015 1:48 PM

## 2015-02-11 ENCOUNTER — Encounter: Payer: Self-pay | Admitting: Internal Medicine

## 2015-02-11 ENCOUNTER — Other Ambulatory Visit (HOSPITAL_BASED_OUTPATIENT_CLINIC_OR_DEPARTMENT_OTHER): Payer: BLUE CROSS/BLUE SHIELD

## 2015-02-11 ENCOUNTER — Ambulatory Visit (HOSPITAL_BASED_OUTPATIENT_CLINIC_OR_DEPARTMENT_OTHER): Payer: BLUE CROSS/BLUE SHIELD | Admitting: Nurse Practitioner

## 2015-02-11 ENCOUNTER — Other Ambulatory Visit: Payer: Self-pay | Admitting: Nurse Practitioner

## 2015-02-11 ENCOUNTER — Ambulatory Visit: Payer: BLUE CROSS/BLUE SHIELD

## 2015-02-11 ENCOUNTER — Other Ambulatory Visit: Payer: Self-pay | Admitting: Medical Oncology

## 2015-02-11 ENCOUNTER — Telehealth: Payer: Self-pay | Admitting: Internal Medicine

## 2015-02-11 VITALS — BP 129/79 | HR 72 | Temp 97.8°F | Resp 18 | Ht 72.0 in | Wt 150.5 lb

## 2015-02-11 DIAGNOSIS — C7931 Secondary malignant neoplasm of brain: Secondary | ICD-10-CM | POA: Diagnosis not present

## 2015-02-11 DIAGNOSIS — E440 Moderate protein-calorie malnutrition: Secondary | ICD-10-CM

## 2015-02-11 DIAGNOSIS — C3432 Malignant neoplasm of lower lobe, left bronchus or lung: Secondary | ICD-10-CM

## 2015-02-11 DIAGNOSIS — R531 Weakness: Secondary | ICD-10-CM

## 2015-02-11 DIAGNOSIS — Z720 Tobacco use: Secondary | ICD-10-CM

## 2015-02-11 DIAGNOSIS — Z79899 Other long term (current) drug therapy: Secondary | ICD-10-CM | POA: Diagnosis not present

## 2015-02-11 DIAGNOSIS — R634 Abnormal weight loss: Secondary | ICD-10-CM

## 2015-02-11 DIAGNOSIS — Z9889 Other specified postprocedural states: Secondary | ICD-10-CM

## 2015-02-11 DIAGNOSIS — R748 Abnormal levels of other serum enzymes: Secondary | ICD-10-CM

## 2015-02-11 DIAGNOSIS — R5383 Other fatigue: Secondary | ICD-10-CM | POA: Diagnosis not present

## 2015-02-11 DIAGNOSIS — R079 Chest pain, unspecified: Secondary | ICD-10-CM

## 2015-02-11 DIAGNOSIS — G8918 Other acute postprocedural pain: Secondary | ICD-10-CM

## 2015-02-11 LAB — COMPREHENSIVE METABOLIC PANEL
ALBUMIN: 3.1 g/dL — AB (ref 3.5–5.0)
ALT: 145 U/L — ABNORMAL HIGH (ref 0–55)
AST: 68 U/L — AB (ref 5–34)
Anion Gap: 11 mEq/L (ref 3–11)
BILIRUBIN TOTAL: 0.77 mg/dL (ref 0.20–1.20)
BUN: 11.3 mg/dL (ref 7.0–26.0)
CO2: 21 mEq/L — ABNORMAL LOW (ref 22–29)
Calcium: 9.3 mg/dL (ref 8.4–10.4)
Chloride: 106 mEq/L (ref 98–109)
Creatinine: 0.8 mg/dL (ref 0.7–1.3)
EGFR: >90 mL/min/{1.73_m2} (ref >90)
GLUCOSE: 121 mg/dL (ref 70–140)
Potassium: 3.7 mEq/L (ref 3.5–5.1)
SODIUM: 137 meq/L (ref 136–145)
TOTAL PROTEIN: 6.9 g/dL (ref 6.4–8.3)

## 2015-02-11 LAB — CBC WITH DIFFERENTIAL/PLATELET
BASO%: 1.1 % (ref 0.0–2.0)
Basophils Absolute: 0.1 10*3/uL (ref 0.0–0.1)
EOS%: 10.6 % — AB (ref 0.0–7.0)
Eosinophils Absolute: 1.3 10*3/uL — ABNORMAL HIGH (ref 0.0–0.5)
HCT: 41.4 % (ref 38.4–49.9)
HEMOGLOBIN: 13.9 g/dL (ref 13.0–17.1)
LYMPH#: 2.3 10*3/uL (ref 0.9–3.3)
LYMPH%: 18.7 % (ref 14.0–49.0)
MCH: 32.6 pg (ref 27.2–33.4)
MCHC: 33.7 g/dL (ref 32.0–36.0)
MCV: 96.8 fL (ref 79.3–98.0)
MONO#: 1.2 10*3/uL — ABNORMAL HIGH (ref 0.1–0.9)
MONO%: 10 % (ref 0.0–14.0)
NEUT%: 59.6 % (ref 39.0–75.0)
NEUTROS ABS: 7.4 10*3/uL — AB (ref 1.5–6.5)
Platelets: 370 10*3/uL (ref 140–400)
RBC: 4.27 10*6/uL (ref 4.20–5.82)
RDW: 14.7 % — AB (ref 11.0–14.6)
WBC: 12.4 10*3/uL — ABNORMAL HIGH (ref 4.0–10.3)

## 2015-02-11 LAB — TSH: TSH: 0.378 m(IU)/L (ref 0.320–4.118)

## 2015-02-11 MED ORDER — SODIUM CHLORIDE 0.9 % IV SOLN: 200.0000 mg | Freq: Once | INTRAVENOUS | Status: DC

## 2015-02-11 MED ORDER — OXYCODONE HCL 5 MG PO TABS: 5.0000 mg | ORAL_TABLET | Freq: Four times a day (QID) | ORAL | Status: DC | PRN

## 2015-02-11 MED ORDER — SODIUM CHLORIDE 0.9 % IV SOLN: Freq: Once | INTRAVENOUS | Status: DC

## 2015-02-11 NOTE — Telephone Encounter (Signed)
Left message for patient to give date/time for his r/s appointments per 2/1 pof

## 2015-02-11 NOTE — Progress Notes (Signed)
Ned Card NP to infusion to discuss plan with patient. Pt discharged per Lattie Haw NP. Pt in stable condition at time of discharge.

## 2015-02-11 NOTE — Progress Notes (Signed)
Pt is approved for the $400 [facility] grant.  °

## 2015-02-11 NOTE — Progress Notes (Signed)
Hold treatment today per Dr. Julien Nordmann due to elevated alkaline phosphatase. Patient verbalized understanding.

## 2015-02-11 NOTE — Progress Notes (Signed)
  [facility] OFFICE PROGRESS NOTE   DIAGNOSIS: Stage IV (T2a, N1, M1b) non-small cell lung cancer, adenocarcinoma, negative EGFR mutation but equivocal EGFR amplification, negative ALK gene translocation and negative ROS 1 but with PDL-1 expression 100% presented with left lower lobe lung mass in addition to left hilar adenopathy and solitary metastatic brain lesion diagnosed in December 2016.  PRIOR THERAPY:  1) status post stereotactic radiotherapy and surgical resection diagnosed in December 2016. 2) status post video bronchoscopy with left VATS and wedge resection of the left upper lobe and left lower lobe superior segmentectomy with lymph node dissection under the care of Dr. Servando Snare on 01/14/2015.  CURRENT THERAPY: Immunotherapy with Ketruda (pembrolizumab) 200 MG IV every 3 weeks, first dose 02/11/2015    INTERVAL HISTORY:   Mr. Sanchez returns as scheduled. He continues to have left-sided chest pain which he relates to surgery. The pain is improving. He takes oxycodone 3 or 4 times a day. He has difficulty sleeping which he relates to the pain. Appetite remains poor. He has lost some weight since his last visit. He is fatigued. He has not started Remeron.  Objective:  Vital signs in last 24 hours:  Blood pressure 129/79, pulse 72, temperature 97.8 F (36.6 C), temperature source Oral, resp. rate 18, height 6' (1.829 m), weight 150 lb 8 oz (68.266 kg), SpO2 100 %.    HEENT: No thrush or ulcers. Resp: Lungs clear bilaterally. Cardio: Regular rate and rhythm. GI: Abdomen soft and nontender. No hepatomegaly. Vascular: No leg edema. Calves soft and nontender. Neuro: Alert and oriented. Follows commands.  Skin: Scalp and left chest with healing incisions. No surrounding erythema.    Lab Results:  Lab Results  Component Value Date   WBC 12.4* 02/11/2015   HGB 13.9 02/11/2015   HCT 41.4 02/11/2015   MCV 96.8 02/11/2015   PLT 370 02/11/2015   NEUTROABS 7.4*  02/11/2015  Alkaline phosphatase 646 AST 68 ALT 145  Imaging:  No results found.  Medications: I have reviewed the patient's current medications.  Assessment/Plan: 1. Stage IV non-small cell lung cancer presenting with a solitary brain metastasis status post SRS followed by resection; also status post wedge resection left upper lobe as well as left lower lobe superior segmentectomy with lymph node dissection. Left lower lobe resection margin positive. Molecular study with equivocal EGFR amplification, negative ALK gene translocation, negative Ros 1 but the immunohistochemical stains showed positive for PDL 1 expression at 100%. 2. New elevation of liver enzymes 02/11/2015.   Disposition: Mr. Duval has stage IV non-small cell lung cancer. He was scheduled to begin treatment with pembrolizumab today. Labs unexpectedly showed new elevation of the liver enzymes. We are holding today's treatment and referring him for CT scans of the abdomen/pelvis. Treatment will be rescheduled pending CT results.  Plan reviewed/discussed with Dr. Julien Nordmann.    Ned Card ANP/GNP-BC   02/11/2015  11:30 AM

## 2015-02-13 ENCOUNTER — Ambulatory Visit (HOSPITAL_COMMUNITY): Payer: BLUE CROSS/BLUE SHIELD

## 2015-02-16 ENCOUNTER — Ambulatory Visit (HOSPITAL_COMMUNITY)
Admission: RE | Admit: 2015-02-16 | Discharge: 2015-02-16 | Disposition: A | Discharge status: Home / Self Care | Payer: BLUE CROSS/BLUE SHIELD | Source: Ambulatory Visit | Attending: Nurse Practitioner | Admitting: Nurse Practitioner

## 2015-02-16 ENCOUNTER — Encounter (HOSPITAL_COMMUNITY): Payer: Self-pay

## 2015-02-16 DIAGNOSIS — K769 Liver disease, unspecified: Secondary | ICD-10-CM | POA: Insufficient documentation

## 2015-02-16 DIAGNOSIS — M899 Disorder of bone, unspecified: Secondary | ICD-10-CM | POA: Insufficient documentation

## 2015-02-16 DIAGNOSIS — C7931 Secondary malignant neoplasm of brain: Secondary | ICD-10-CM

## 2015-02-16 DIAGNOSIS — R945 Abnormal results of liver function studies: Secondary | ICD-10-CM | POA: Insufficient documentation

## 2015-02-16 DIAGNOSIS — C3432 Malignant neoplasm of lower lobe, left bronchus or lung: Secondary | ICD-10-CM | POA: Insufficient documentation

## 2015-02-16 DIAGNOSIS — N289 Disorder of kidney and ureter, unspecified: Secondary | ICD-10-CM | POA: Diagnosis not present

## 2015-02-16 DIAGNOSIS — I7090 Unspecified atherosclerosis: Secondary | ICD-10-CM | POA: Diagnosis not present

## 2015-02-16 DIAGNOSIS — K5730 Diverticulosis of large intestine without perforation or abscess without bleeding: Secondary | ICD-10-CM | POA: Diagnosis not present

## 2015-02-16 DIAGNOSIS — R748 Abnormal levels of other serum enzymes: Secondary | ICD-10-CM

## 2015-02-16 MED ORDER — IOHEXOL 300 MG/ML  SOLN
100.0000 mL | Freq: Once | INTRAMUSCULAR | Status: AC | PRN
  Administered 2015-02-16: 100 mL via INTRAVENOUS

## 2015-02-17 ENCOUNTER — Other Ambulatory Visit: Payer: BLUE CROSS/BLUE SHIELD

## 2015-02-17 ENCOUNTER — Ambulatory Visit: Payer: BLUE CROSS/BLUE SHIELD | Admitting: Internal Medicine

## 2015-02-18 ENCOUNTER — Other Ambulatory Visit (HOSPITAL_BASED_OUTPATIENT_CLINIC_OR_DEPARTMENT_OTHER): Payer: BLUE CROSS/BLUE SHIELD

## 2015-02-18 ENCOUNTER — Ambulatory Visit (HOSPITAL_BASED_OUTPATIENT_CLINIC_OR_DEPARTMENT_OTHER): Payer: BLUE CROSS/BLUE SHIELD | Admitting: Internal Medicine

## 2015-02-18 ENCOUNTER — Ambulatory Visit (HOSPITAL_BASED_OUTPATIENT_CLINIC_OR_DEPARTMENT_OTHER): Payer: BLUE CROSS/BLUE SHIELD

## 2015-02-18 ENCOUNTER — Telehealth: Payer: Self-pay | Admitting: Internal Medicine

## 2015-02-18 ENCOUNTER — Encounter: Payer: Self-pay | Admitting: *Deleted

## 2015-02-18 ENCOUNTER — Encounter: Payer: Self-pay | Admitting: Internal Medicine

## 2015-02-18 VITALS — BP 115/95 | HR 98 | Temp 98.0°F | Resp 18 | Ht 72.0 in | Wt 151.4 lb

## 2015-02-18 DIAGNOSIS — C7931 Secondary malignant neoplasm of brain: Secondary | ICD-10-CM

## 2015-02-18 DIAGNOSIS — F329 Major depressive disorder, single episode, unspecified: Secondary | ICD-10-CM | POA: Diagnosis not present

## 2015-02-18 DIAGNOSIS — C3432 Malignant neoplasm of lower lobe, left bronchus or lung: Secondary | ICD-10-CM | POA: Diagnosis not present

## 2015-02-18 DIAGNOSIS — Z5111 Encounter for antineoplastic chemotherapy: Secondary | ICD-10-CM

## 2015-02-18 DIAGNOSIS — Z9889 Other specified postprocedural states: Secondary | ICD-10-CM

## 2015-02-18 DIAGNOSIS — Z720 Tobacco use: Secondary | ICD-10-CM

## 2015-02-18 DIAGNOSIS — R531 Weakness: Secondary | ICD-10-CM

## 2015-02-18 DIAGNOSIS — E440 Moderate protein-calorie malnutrition: Secondary | ICD-10-CM

## 2015-02-18 LAB — CBC WITH DIFFERENTIAL/PLATELET
BASO%: 0.2 % (ref 0.0–2.0)
BASOS ABS: 0 10*3/uL (ref 0.0–0.1)
EOS ABS: 4.2 10*3/uL — AB (ref 0.0–0.5)
EOS%: 32.8 % — AB (ref 0.0–7.0)
HEMATOCRIT: 46.6 % (ref 38.4–49.9)
HEMOGLOBIN: 16.2 g/dL (ref 13.0–17.1)
LYMPH%: 19.1 % (ref 14.0–49.0)
MCH: 33.3 pg (ref 27.2–33.4)
MCHC: 34.8 g/dL (ref 32.0–36.0)
MCV: 95.7 fL (ref 79.3–98.0)
MONO#: 0.9 10*3/uL (ref 0.1–0.9)
MONO%: 7.4 % (ref 0.0–14.0)
NEUT%: 40.5 % (ref 39.0–75.0)
NEUTROS ABS: 5.1 10*3/uL (ref 1.5–6.5)
PLATELETS: 405 10*3/uL — AB (ref 140–400)
RBC: 4.87 10*6/uL (ref 4.20–5.82)
RDW: 14 % (ref 11.0–14.6)
WBC: 12.7 10*3/uL — ABNORMAL HIGH (ref 4.0–10.3)
lymph#: 2.4 10*3/uL (ref 0.9–3.3)

## 2015-02-18 LAB — COMPREHENSIVE METABOLIC PANEL
ALBUMIN: 3.3 g/dL — AB (ref 3.5–5.0)
ALK PHOS: 345 U/L — AB (ref 40–150)
ALT: 125 U/L — ABNORMAL HIGH (ref 0–55)
ANION GAP: 11 meq/L (ref 3–11)
AST: 63 U/L — ABNORMAL HIGH (ref 5–34)
BILIRUBIN TOTAL: 0.43 mg/dL (ref 0.20–1.20)
BUN: 15.5 mg/dL (ref 7.0–26.0)
CO2: 26 mEq/L (ref 22–29)
Calcium: 9.3 mg/dL (ref 8.4–10.4)
Chloride: 103 mEq/L (ref 98–109)
Creatinine: 1.2 mg/dL (ref 0.7–1.3)
EGFR: 70 mL/min/{1.73_m2} — AB (ref >90)
GLUCOSE: 94 mg/dL (ref 70–140)
Potassium: 4.5 mEq/L (ref 3.5–5.1)
SODIUM: 140 meq/L (ref 136–145)
TOTAL PROTEIN: 7.2 g/dL (ref 6.4–8.3)

## 2015-02-18 MED ORDER — SODIUM CHLORIDE 0.9 % IV SOLN
Freq: Once | INTRAVENOUS | Status: AC
  Administered 2015-02-18: 14:00:00 via INTRAVENOUS

## 2015-02-18 MED ORDER — SODIUM CHLORIDE 0.9 % IV SOLN
200.0000 mg | Freq: Once | INTRAVENOUS | Status: AC
  Administered 2015-02-18: 200 mg via INTRAVENOUS
  Filled 2015-02-18: qty 8

## 2015-02-18 MED ORDER — LEVETIRACETAM 500 MG PO TABS: 500.0000 mg | ORAL_TABLET | Freq: Every morning | ORAL | Status: DC

## 2015-02-18 NOTE — Patient Instructions (Addendum)
[facility] Discharge Instructions for Patients Receiving Chemotherapy  Today you received the following Immunotherapy;  Keytruda.    BELOW ARE SYMPTOMS THAT SHOULD BE REPORTED IMMEDIATELY:  *FEVER GREATER THAN 100.5 F  *CHILLS WITH OR WITHOUT FEVER  NAUSEA AND VOMITING THAT IS NOT CONTROLLED WITH YOUR NAUSEA MEDICATION  *UNUSUAL SHORTNESS OF BREATH  *UNUSUAL BRUISING OR BLEEDING  TENDERNESS IN MOUTH AND THROAT WITH OR WITHOUT PRESENCE OF ULCERS  *URINARY PROBLEMS  *BOWEL PROBLEMS  UNUSUAL RASH Items with * indicate a potential emergency and should be followed up as soon as possible.  Feel free to call the clinic you have any questions or concerns. The clinic phone number is [phone].  Please show the [facility] at check-in to the Emergency Department and triage nurse.  Pembrolizumab injection What is this medicine? PEMBROLIZUMAB (pem broe liz ue mab) is a monoclonal antibody. It is used to treat melanoma and non-small cell lung cancer. This medicine may be used for other purposes; ask your health care provider or pharmacist if you have questions. What should I tell my health care provider before I take this medicine? They need to know if you have any of these conditions: -diabetes -immune system problems -inflammatory bowel disease -liver disease -lung or breathing disease -lupus -an unusual or allergic reaction to pembrolizumab, other medicines, foods, dyes, or preservatives -pregnant or trying to get pregnant -breast-feeding How should I use this medicine? This medicine is for infusion into a vein. It is given by a health care professional in a hospital or clinic setting. A special MedGuide will be given to you before each treatment. Be sure to read this information carefully each time. Talk to your pediatrician regarding the use of this medicine in children. Special care may be needed. Overdosage: If you think you have taken too much of  this medicine contact a poison control center or emergency room at once. NOTE: This medicine is only for you. Do not share this medicine with others. What if I miss a dose? It is important not to miss your dose. Call your doctor or health care professional if you are unable to keep an appointment. What may interact with this medicine? Interactions have not been studied. Give your health care provider a list of all the medicines, herbs, non-prescription drugs, or dietary supplements you use. Also tell them if you smoke, drink alcohol, or use illegal drugs. Some items may interact with your medicine. This list may not describe all possible interactions. Give your health care provider a list of all the medicines, herbs, non-prescription drugs, or dietary supplements you use. Also tell them if you smoke, drink alcohol, or use illegal drugs. Some items may interact with your medicine. What should I watch for while using this medicine? Your condition will be monitored carefully while you are receiving this medicine. You may need blood work done while you are taking this medicine. Do not become pregnant while taking this medicine or for 4 months after stopping it. Women should inform their doctor if they wish to become pregnant or think they might be pregnant. There is a potential for serious side effects to an unborn child. Talk to your health care professional or pharmacist for more information. Do not breast-feed an infant while taking this medicine or for 4 months after the last dose. What side effects may I notice from receiving this medicine? Side effects that you should report to your doctor or health care professional as soon as possible: -  allergic reactions like skin rash, itching or hives, swelling of the face, lips, or tongue -bloody or black, tarry stools -breathing problems -change in the amount of urine -changes in vision -chest pain -chills -dark urine -dizziness or feeling faint or  lightheaded -fast or irregular heartbeat -fever -flushing -hair loss -muscle pain -muscle weakness -persistent headache -signs and symptoms of high blood sugar such as dizziness; dry mouth; dry skin; fruity breath; nausea; stomach pain; increased hunger or thirst; increased urination -signs and symptoms of liver injury like dark urine, light-colored stools, loss of appetite, nausea, right upper belly pain, yellowing of the eyes or skin -stomach pain -weight loss Side effects that usually do not require medical attention (Report these to your doctor or health care professional if they continue or are bothersome.):constipation -cough -diarrhea -joint pain -tiredness This list may not describe all possible side effects. Call your doctor for medical advice about side effects. You may report side effects to FDA at 1-800-FDA-1088. Where should I keep my medicine? This drug is given in a hospital or clinic and will not be stored at home. NOTE: This sheet is a summary. It may not cover all possible information. If you have questions about this medicine, talk to your doctor, pharmacist, or health care provider.    2016, Elsevier/Gold Standard. (2014-02-25 17:24:19)

## 2015-02-18 NOTE — Progress Notes (Signed)
[facility] [phone]   [phone]) [phone]  OFFICE PROGRESS NOTE  Obrien, Timothy Graff, MD [address] [place] [address]  DIAGNOSIS: Stage IV (T2a, N1, M1b) non-small cell lung cancer, adenocarcinoma, negative EGFR mutation but equivocal EGFR amplification, negative ALK gene translocation and negative ROS 1but with PDL-1 expression 100% presented with left lower lobe lung mass in addition to left hilar adenopathy and solitary metastatic brain lesion diagnosed in December 2016.  PRIOR THERAPY:  1) status post stereotactic radiotherapy and surgical resection diagnosed in December 2016. 2) status post video bronchoscopy with left VATS and wedge resection of the left upper lobe and left lower lobe superior segmentectomy with lymph node dissection under the care of Dr. Servando Snare on 01/14/2015.  CURRENT THERAPY: Immunotherapy with [name] (pembrolizumab) 200 MG IV every 3 weeks, first dose 02/18/2015  INTERVAL HISTORY: Timothy Obrien 55 y.o. male returns to the clinic today for follow-up visit accompanied by his ex-wife. The patient was supposed to start the first cycle of his treatment with immunotherapy with [name] (pembrolizumab) last week but unfortunately has significant elevation of the liver enzymes. He had repeat CT scan of the abdomen and pelvis for further evaluation and to rule out any biliary obstruction or liver metastasis. He is feeling much better. He is currently on Keppra 750 mg by mouth twice a day for seizure prophylaxis. He also drinks a few alcoholic drinks every day and he has been taken ibuprofen for thoracic surgical pain. He denied having any significant shortness breath, cough or hemoptysis. He denied having any fever or chills. The patient denied having any significant nausea or vomiting. He is today for reevaluation before starting his treatment.  MEDICAL HISTORY: Past Medical History  Diagnosis Date  . Lung cancer ([facility])    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  . Pneumonia   . Bronchitis   . Tobacco abuse   . Seizures ([facility])     last 12/11/14  . Shortness of breath dyspnea     with exertion    ALLERGIES:  has No Known Allergies.  MEDICATIONS:  Current Outpatient Prescriptions  Medication Sig Dispense Refill  . ibuprofen (ADVIL,MOTRIN) 200 MG tablet Take 200 mg by mouth every 6 (six) hours as needed.    . levETIRAcetam (KEPPRA) 750 MG tablet Take 1 tablet (750 mg total) by mouth 2 (two) times daily. 60 tablet 0  . mirtazapine (REMERON) 30 MG tablet Take 1 tablet (30 mg total) by mouth at bedtime. 30 tablet 2  . oxyCODONE (OXY IR/ROXICODONE) 5 MG immediate release tablet Take 1-2 tablets (5-10 mg total) by mouth every 6 (six) hours as needed for moderate pain or severe pain. 40 tablet 0  . clonazePAM (KLONOPIN) 0.5 MG tablet Take 1 tablet (0.5 mg total) by mouth 3 (three) times daily as needed for anxiety. (Patient not taking: Reported on 02/18/2015) 15 tablet 0   No current facility-administered medications for this visit.    SURGICAL HISTORY:  Past Surgical History  Procedure Laterality Date  . Hernia repair    . Vasectomy    . Video bronchoscopy Bilateral 12/16/2014    Procedure: VIDEO BRONCHOSCOPY WITH FLUORO;  Surgeon: Collene Gobble, MD;  Location: [facility];  Service: Cardiopulmonary;  Laterality: Bilateral;  . Craniotomy N/A 12/26/2014    Procedure: CRANIOTOMY TUMOR EXCISION with BrainLab;  Surgeon: Kevan Ny Ditty, MD;  Location: [facility];  Service: Neurosurgery;  Laterality: N/A;  CRANIOTOMY  TUMOR EXCISION with [facility]  . Application of cranial navigation N/A 12/26/2014    Procedure: APPLICATION OF CRANIAL NAVIGATION;  Surgeon: Kevan Ny Ditty, MD;  Location: [facility];  Service: Neurosurgery;  Laterality: N/A;  . Video bronchoscopy N/A 01/14/2015    Procedure: VIDEO BRONCHOSCOPY;  Surgeon: Grace Isaac, MD;  Location: [facility];  Service: Thoracic;  Laterality: N/A;  . Video  assisted thoracoscopy (vats)/wedge resection Left 01/14/2015    Procedure: VIDEO ASSISTED THORACOSCOPY (VATS)/WEDGE RESECTION, Superior segmentectomy left  lower lobe, wedge resection of left upper lobe, multiple lymph node disection, On Q insertion.;  Surgeon: Grace Isaac, MD;  Location: [facility];  Service: Thoracic;  Laterality: Left;    REVIEW OF SYSTEMS:  Constitutional: positive for fatigue Eyes: negative Ears, nose, mouth, throat, and face: negative Respiratory: positive for dyspnea on exertion and pleurisy/chest pain Cardiovascular: negative Gastrointestinal: negative Genitourinary:negative Integument/breast: negative Hematologic/lymphatic: negative Musculoskeletal:negative Neurological: negative Behavioral/Psych: negative Endocrine: negative Allergic/Immunologic: negative   PHYSICAL EXAMINATION: General appearance: alert, cooperative, fatigued and no distress Head: Normocephalic, without obvious abnormality, atraumatic Neck: no adenopathy, no JVD, supple, symmetrical, trachea midline and thyroid not enlarged, symmetric, no tenderness/mass/nodules Lymph nodes: Cervical, supraclavicular, and axillary nodes normal. Resp: clear to auscultation bilaterally Back: symmetric, no curvature. ROM normal. No CVA tenderness. Cardio: regular rate and rhythm, S1, S2 normal, no murmur, click, rub or gallop GI: soft, non-tender; bowel sounds normal; no masses,  no organomegaly Extremities: extremities normal, atraumatic, no cyanosis or edema Neurologic: Alert and oriented X 3, normal strength and tone. Normal symmetric reflexes. Normal coordination and gait  ECOG PERFORMANCE STATUS: 1 - Symptomatic but completely ambulatory  Blood pressure 115/95, pulse 98, temperature 98 F (36.7 C), temperature source Oral, resp. rate 18, height 6' (1.829 m), weight 151 lb 6.4 oz (68.675 kg), SpO2 100 %.  LABORATORY DATA: Lab Results  Component Value Date   WBC 12.7* 02/18/2015   HGB 16.2 02/18/2015     HCT 46.6 02/18/2015   MCV 95.7 02/18/2015   PLT 405* 02/18/2015      Chemistry      Component Value Date/Time   NA 140 02/18/2015 1143   NA 138 01/16/2015 0450   K 4.5 02/18/2015 1143   K 4.1 01/16/2015 0450   CL 109 01/16/2015 0450   CO2 26 02/18/2015 1143   CO2 22 01/16/2015 0450   BUN 15.5 02/18/2015 1143   BUN 6 01/16/2015 0450   CREATININE 1.2 02/18/2015 1143   CREATININE 0.68 01/16/2015 0450      Component Value Date/Time   CALCIUM 9.3 02/18/2015 1143   CALCIUM 8.3* 01/16/2015 0450   ALKPHOS 345* 02/18/2015 1143   ALKPHOS 60 01/16/2015 0450   AST 63* 02/18/2015 1143   AST 31 01/16/2015 0450   ALT 125* 02/18/2015 1143   ALT 26 01/16/2015 0450   BILITOT 0.43 02/18/2015 1143   BILITOT 0.9 01/16/2015 0450       RADIOGRAPHIC STUDIES: Dg Chest 2 View  02/05/2015  CLINICAL DATA:  Status post left VATS for lung cancer on 01/14/2015. Followup left pneumothorax. EXAM: CHEST  2 VIEW COMPARISON:  01/18/2015. FINDINGS: Normal sized heart. Minimal linear density at the left lung base with significant improvement. Clear right lung. Mild central peribronchial thickening. The lungs remain mildly hyperexpanded. Left hilar surgical staples. The previously seen left pneumothorax is no longer demonstrated. Interval partial healing of left seventh and eighth posterolateral rib fractures. IMPRESSION: 1. Resolved left pneumothorax. 2. Small amount of linear scarring and bullous change  at the left lung base. 3. Stable mild changes of COPD and chronic bronchitis. Electronically Signed   By: Claudie Revering M.D.   On: 02/05/2015 13:32   Ct Abdomen Pelvis W Contrast  02/16/2015  CLINICAL DATA:  Lung cancer, brain metastatic disease. Partial lung resection. Elevated liver function tests. Radiation therapy to the brain completed. Immunotherapy is planned. EXAM: CT ABDOMEN AND PELVIS WITH CONTRAST TECHNIQUE: Multidetector CT imaging of the abdomen and pelvis was performed using the standard protocol  following bolus administration of intravenous contrast. CONTRAST:  184m OMNIPAQUE IOHEXOL 300 MG/ML  SOLN COMPARISON:  Multiple exams, including 12/24/2014 FINDINGS: Lower chest: Bandlike atelectasis/ scarring medially in the left lower lobe with some associated volume loss. Much of this appearance is likely postoperative. Hepatobiliary: 5 by 4 mm hypodense lesion posteriorly in segment 5, no change from 12/13/2014, appearance compatible with cyst. 3 mm hypodense lesion posteriorly in the lateral segment left hepatic lobe, stable and favoring cyst although technically too small to characterize. Questionable 2 mm subcapsular hypodense lesion in segment 4b, image 27 series 2, too small to characterize but probably a cyst. Gallbladder unremarkable. Pancreas: Unremarkable Spleen: Unremarkable Adrenals/Urinary Tract: Adrenal glands normal. Stable 8 mm right kidney upper pole hypodense lesion, most likely a cyst but technically too small to characterize. Similar 0.7 by 0.5 cm lesion in the right kidney lower pole, not changed from prior. Stomach/Bowel: Sigmoid diverticulosis. Vascular/Lymphatic: Aortoiliac atherosclerotic vascular disease. No pathologic abdominal adenopathy observed. Reproductive: Unremarkable Other: No supplemental non-categorized findings. Musculoskeletal: Stable 8 mm rim sclerotic lesion in the left iliac bone near the SI joint, image 56 of series 2, not changed from 12/13/2014, and not appreciably hypermetabolic on prior PET-CT. Spurring of both femoral heads. 1.4 cm mainly sclerotic lesion in the junction of the right femoral neck and intertrochanteric region, no change from priors and not previously hypermetabolic, likely benign. Degenerative disc disease with loss of disc height and vacuum disc phenomenon at L5-S1, posterior osseous ridging contributing to borderline bilateral foraminal stenosis at this level. IMPRESSION: 1. Stable tiny hepatic and right renal lesions are statistically highly  likely to represent cysts and unchanged, but may merit observation as they are technically nonspecific due to small size. 2. No specific findings of recurrence/metastatic disease to the abdomen or pelvis. 3. Several small lesions in the left iliac bone and right proximal femur are likely benign and incidental. These were not previously hypermetabolic on PET-CT, and appear unchanged. 4.  Aortoiliac atherosclerotic vascular disease. 5. Sigmoid colon diverticulosis. 6. Bandlike scarring in the left lower lobe. 7. No specific CT findings to explain elevated liver function tests. Electronically Signed   By: WVan ClinesM.D.   On: 02/16/2015 08:08    ASSESSMENT AND PLAN: This is a very pleasant 55years old white male diagnosed with a stage IV non-small cell lung cancer, T2a, N1, M1b) non-small cell lung cancer presented with solitary brain metastasis status post stereotactic radiotherapy followed by craniotomy and resection of the brain tumor. The patient also underwent wedge resection of the left upper lobe as well as left lower lobe superior segmentectomy with lymph node dissection. Unfortunately the left lower lobe resection margin was positive for adenocarcinoma. His molecular study showed equivocal EGFR amplification, negative ALK gene translocation, negative Ros 1 but the immunohistochemical stains showed positive for PDL 1 expression at 100%. His recent CT scan of the abdomen and pelvis showed no concerning findings in the liver. His alkaline phosphatase is down compared to last week. His liver dysfunction  could be secondary to his treatment with high-dose Keppra 750 mg by mouth twice a day. I recommended for the patient to decrease the dose of Keppra to 500 mg by mouth daily. I also strongly advise him to stop taking any NSAIDs and to cut his alcohol drinking. We will proceed with the first cycle of his treatment with immunotherapy with [place] today as a scheduled. I will continue to monitor his  CBC and comprehensive metabolic panel on weekly basis during his treatment with the first cycle of Ketruda (pembrolizumab). For depression, I will start the patient on Remeron 30 mg by mouth daily at bedtime. He would come back for follow-up visit in 3 weeks for reevaluation before starting cycle #2.. The patient was advised to call if he has any concerning symptoms in the interval. The patient voices understanding of current disease status and treatment options and is in agreement with the current care plan.  All questions were answered. The patient knows to call the clinic with any problems, questions or concerns. We can certainly see the patient much sooner if necessary.  I spent 15 minutes counseling the patient face to face. The total time spent in the appointment was 25 minutes.  Disclaimer: This note was dictated with voice recognition software. Similar sounding words can inadvertently be transcribed and may not be corrected upon review.

## 2015-02-18 NOTE — Telephone Encounter (Signed)
Patient already on schedule for lab/fu/tx 2/22, 3/15 and 4/5. No other orders per 2/8 pof.

## 2015-02-18 NOTE — Progress Notes (Signed)
Per Dr. Julien Nordmann, okay to tx with elevated enzymes

## 2015-02-20 NOTE — Telephone Encounter (Signed)
Due to 2/1 tx delayed to 2/8 remaining appointments were adjusted to be q3w from 2/8. Per 2/8 pof wkly lab and chemo as ordered q3w - tx plan has not yet been updated - confirmed with desk nurse next tx should be 3/1. Appointments for lab/fu/tx scheduled for 3/2 due to no availability 3/1. Left message for patient re changes and gave next threes appointment dates/times for 2/15, 2/22 and 3/2. Patient made aware appointments are available on mychart and asked to call if he has any questions.

## 2015-02-23 ENCOUNTER — Telehealth: Payer: Self-pay | Admitting: Medical Oncology

## 2015-02-23 NOTE — Telephone Encounter (Signed)
I left message for pt to call back and tell us how he did last week after chemo . I  told him he can speak to any nurse.

## 2015-02-25 ENCOUNTER — Other Ambulatory Visit (HOSPITAL_BASED_OUTPATIENT_CLINIC_OR_DEPARTMENT_OTHER): Payer: BLUE CROSS/BLUE SHIELD

## 2015-02-25 ENCOUNTER — Encounter: Payer: Self-pay | Admitting: Internal Medicine

## 2015-02-25 DIAGNOSIS — C7931 Secondary malignant neoplasm of brain: Secondary | ICD-10-CM

## 2015-02-25 DIAGNOSIS — C3432 Malignant neoplasm of lower lobe, left bronchus or lung: Secondary | ICD-10-CM | POA: Diagnosis not present

## 2015-02-25 LAB — CBC WITH DIFFERENTIAL/PLATELET
BASO%: 0.7 % (ref 0.0–2.0)
BASOS ABS: 0.1 10*3/uL (ref 0.0–0.1)
EOS ABS: 3.4 10*3/uL — AB (ref 0.0–0.5)
EOS%: 23.2 % — ABNORMAL HIGH (ref 0.0–7.0)
HCT: 43 % (ref 38.4–49.9)
HGB: 14.6 g/dL (ref 13.0–17.1)
LYMPH%: 28.8 % (ref 14.0–49.0)
MCH: 32.4 pg (ref 27.2–33.4)
MCHC: 34 g/dL (ref 32.0–36.0)
MCV: 95.6 fL (ref 79.3–98.0)
MONO#: 0.9 10*3/uL (ref 0.1–0.9)
MONO%: 6.1 % (ref 0.0–14.0)
NEUT#: 6 10*3/uL (ref 1.5–6.5)
NEUT%: 41.2 % (ref 39.0–75.0)
NRBC: 0 % (ref 0–0)
PLATELETS: 364 10*3/uL (ref 140–400)
RBC: 4.5 10*6/uL (ref 4.20–5.82)
RDW: 14 % (ref 11.0–14.6)
WBC: 14.7 10*3/uL — ABNORMAL HIGH (ref 4.0–10.3)
lymph#: 4.2 10*3/uL — ABNORMAL HIGH (ref 0.9–3.3)

## 2015-02-25 LAB — COMPREHENSIVE METABOLIC PANEL
ALK PHOS: 204 U/L — AB (ref 40–150)
ALT: 68 U/L — ABNORMAL HIGH (ref 0–55)
AST: 39 U/L — ABNORMAL HIGH (ref 5–34)
Albumin: 3.4 g/dL — ABNORMAL LOW (ref 3.5–5.0)
Anion Gap: 8 mEq/L (ref 3–11)
BUN: 12.2 mg/dL (ref 7.0–26.0)
CALCIUM: 9 mg/dL (ref 8.4–10.4)
CO2: 25 mEq/L (ref 22–29)
CREATININE: 0.8 mg/dL (ref 0.7–1.3)
Chloride: 107 mEq/L (ref 98–109)
EGFR: >90 mL/min/{1.73_m2} (ref >90)
GLUCOSE: 84 mg/dL (ref 70–140)
POTASSIUM: 4.6 meq/L (ref 3.5–5.1)
Sodium: 141 mEq/L (ref 136–145)
TOTAL PROTEIN: 6.8 g/dL (ref 6.4–8.3)
Total Bilirubin: 0.3 mg/dL (ref 0.20–1.20)

## 2015-02-25 NOTE — Progress Notes (Signed)
Completed and faxed signed application to [employer] for copay assistance for [place].

## 2015-02-26 ENCOUNTER — Encounter: Payer: Self-pay | Admitting: *Deleted

## 2015-03-04 ENCOUNTER — Ambulatory Visit: Payer: BLUE CROSS/BLUE SHIELD | Admitting: Internal Medicine

## 2015-03-04 ENCOUNTER — Other Ambulatory Visit (HOSPITAL_BASED_OUTPATIENT_CLINIC_OR_DEPARTMENT_OTHER): Payer: BLUE CROSS/BLUE SHIELD

## 2015-03-04 ENCOUNTER — Telehealth: Payer: Self-pay | Admitting: *Deleted

## 2015-03-04 ENCOUNTER — Other Ambulatory Visit: Payer: Self-pay | Admitting: Medical Oncology

## 2015-03-04 ENCOUNTER — Other Ambulatory Visit: Payer: Self-pay | Admitting: Nurse Practitioner

## 2015-03-04 ENCOUNTER — Ambulatory Visit: Payer: BLUE CROSS/BLUE SHIELD

## 2015-03-04 ENCOUNTER — Other Ambulatory Visit: Payer: BLUE CROSS/BLUE SHIELD

## 2015-03-04 DIAGNOSIS — C3432 Malignant neoplasm of lower lobe, left bronchus or lung: Secondary | ICD-10-CM | POA: Diagnosis not present

## 2015-03-04 DIAGNOSIS — C7931 Secondary malignant neoplasm of brain: Secondary | ICD-10-CM

## 2015-03-04 DIAGNOSIS — G8918 Other acute postprocedural pain: Secondary | ICD-10-CM

## 2015-03-04 LAB — COMPREHENSIVE METABOLIC PANEL
ALK PHOS: 154 U/L — AB (ref 40–150)
ALT: 86 U/L — AB (ref 0–55)
ANION GAP: 9 meq/L (ref 3–11)
AST: 56 U/L — ABNORMAL HIGH (ref 5–34)
Albumin: 3.6 g/dL (ref 3.5–5.0)
BILIRUBIN TOTAL: 0.35 mg/dL (ref 0.20–1.20)
BUN: 14.3 mg/dL (ref 7.0–26.0)
CALCIUM: 9.2 mg/dL (ref 8.4–10.4)
CO2: 26 mEq/L (ref 22–29)
CREATININE: 0.9 mg/dL (ref 0.7–1.3)
Chloride: 107 mEq/L (ref 98–109)
Glucose: 91 mg/dl (ref 70–140)
Potassium: 4.3 mEq/L (ref 3.5–5.1)
Sodium: 142 mEq/L (ref 136–145)
TOTAL PROTEIN: 7.2 g/dL (ref 6.4–8.3)

## 2015-03-04 LAB — CBC WITH DIFFERENTIAL/PLATELET
BASO%: 0.7 % (ref 0.0–2.0)
Basophils Absolute: 0.1 10*3/uL (ref 0.0–0.1)
EOS ABS: 0.5 10*3/uL (ref 0.0–0.5)
EOS%: 4.8 % (ref 0.0–7.0)
HEMATOCRIT: 45.1 % (ref 38.4–49.9)
HGB: 15.3 g/dL (ref 13.0–17.1)
LYMPH#: 3.8 10*3/uL — AB (ref 0.9–3.3)
LYMPH%: 36.2 % (ref 14.0–49.0)
MCH: 32.8 pg (ref 27.2–33.4)
MCHC: 33.9 g/dL (ref 32.0–36.0)
MCV: 96.8 fL (ref 79.3–98.0)
MONO#: 1.3 10*3/uL — AB (ref 0.1–0.9)
MONO%: 11.9 % (ref 0.0–14.0)
NEUT%: 46.4 % (ref 39.0–75.0)
NEUTROS ABS: 4.9 10*3/uL (ref 1.5–6.5)
PLATELETS: 319 10*3/uL (ref 140–400)
RBC: 4.66 10*6/uL (ref 4.20–5.82)
RDW: 14.5 % (ref 11.0–14.6)
WBC: 10.5 10*3/uL — AB (ref 4.0–10.3)

## 2015-03-04 MED ORDER — OXYCODONE HCL 5 MG PO TABS: 5.0000 mg | ORAL_TABLET | Freq: Four times a day (QID) | ORAL | Status: DC | PRN

## 2015-03-04 NOTE — Telephone Encounter (Signed)
Patient is lab only today.  F/U actually 03-12-2015 not 03-04-2015.  [facility] has printed refill.

## 2015-03-04 NOTE — Telephone Encounter (Signed)
Patient completed walk-in form requesting refill for Oxycodone.  Last filled 02-11-2015 for oxy IR 5 mg take 1-2 every 6 hrs prn moderate pain.  Quantity 40. No need to call patient as he has F./U appointment with Dr. Julien Nordmann at 12:00 pm today.  Will notify provider.Marland Kitchen

## 2015-03-09 ENCOUNTER — Encounter: Payer: Self-pay | Admitting: Internal Medicine

## 2015-03-12 ENCOUNTER — Ambulatory Visit (HOSPITAL_BASED_OUTPATIENT_CLINIC_OR_DEPARTMENT_OTHER): Payer: BLUE CROSS/BLUE SHIELD

## 2015-03-12 ENCOUNTER — Ambulatory Visit (HOSPITAL_BASED_OUTPATIENT_CLINIC_OR_DEPARTMENT_OTHER): Payer: BLUE CROSS/BLUE SHIELD | Admitting: Internal Medicine

## 2015-03-12 ENCOUNTER — Telehealth: Payer: Self-pay | Admitting: *Deleted

## 2015-03-12 ENCOUNTER — Other Ambulatory Visit (HOSPITAL_BASED_OUTPATIENT_CLINIC_OR_DEPARTMENT_OTHER): Payer: BLUE CROSS/BLUE SHIELD

## 2015-03-12 ENCOUNTER — Encounter: Payer: Self-pay | Admitting: Internal Medicine

## 2015-03-12 ENCOUNTER — Telehealth: Payer: Self-pay | Admitting: Internal Medicine

## 2015-03-12 VITALS — BP 118/70 | HR 74 | Temp 97.4°F | Resp 18 | Ht 72.0 in | Wt 157.2 lb

## 2015-03-12 DIAGNOSIS — C7931 Secondary malignant neoplasm of brain: Secondary | ICD-10-CM

## 2015-03-12 DIAGNOSIS — Z5112 Encounter for antineoplastic immunotherapy: Secondary | ICD-10-CM

## 2015-03-12 DIAGNOSIS — R079 Chest pain, unspecified: Secondary | ICD-10-CM | POA: Diagnosis not present

## 2015-03-12 DIAGNOSIS — C3432 Malignant neoplasm of lower lobe, left bronchus or lung: Secondary | ICD-10-CM | POA: Diagnosis not present

## 2015-03-12 DIAGNOSIS — Z720 Tobacco use: Secondary | ICD-10-CM

## 2015-03-12 DIAGNOSIS — R531 Weakness: Secondary | ICD-10-CM

## 2015-03-12 DIAGNOSIS — F329 Major depressive disorder, single episode, unspecified: Secondary | ICD-10-CM | POA: Diagnosis not present

## 2015-03-12 DIAGNOSIS — E440 Moderate protein-calorie malnutrition: Secondary | ICD-10-CM

## 2015-03-12 DIAGNOSIS — Z9889 Other specified postprocedural states: Secondary | ICD-10-CM

## 2015-03-12 LAB — CBC WITH DIFFERENTIAL/PLATELET
BASO%: 0.4 % (ref 0.0–2.0)
BASOS ABS: 0.1 10*3/uL (ref 0.0–0.1)
EOS%: 6.1 % (ref 0.0–7.0)
Eosinophils Absolute: 0.7 10*3/uL — ABNORMAL HIGH (ref 0.0–0.5)
HCT: 44.2 % (ref 38.4–49.9)
HEMOGLOBIN: 15 g/dL (ref 13.0–17.1)
LYMPH%: 30 % (ref 14.0–49.0)
MCH: 32.5 pg (ref 27.2–33.4)
MCHC: 33.9 g/dL (ref 32.0–36.0)
MCV: 95.7 fL (ref 79.3–98.0)
MONO#: 1.2 10*3/uL — ABNORMAL HIGH (ref 0.1–0.9)
MONO%: 10.3 % (ref 0.0–14.0)
NEUT#: 5.9 10*3/uL (ref 1.5–6.5)
NEUT%: 53.2 % (ref 39.0–75.0)
Platelets: 290 10*3/uL (ref 140–400)
RBC: 4.62 10*6/uL (ref 4.20–5.82)
RDW: 14.4 % (ref 11.0–14.6)
WBC: 11.1 10*3/uL — ABNORMAL HIGH (ref 4.0–10.3)
lymph#: 3.3 10*3/uL (ref 0.9–3.3)

## 2015-03-12 LAB — COMPREHENSIVE METABOLIC PANEL
ALT: 105 U/L — ABNORMAL HIGH (ref 0–55)
AST: 57 U/L — AB (ref 5–34)
Albumin: 3.6 g/dL (ref 3.5–5.0)
Alkaline Phosphatase: 133 U/L (ref 40–150)
Anion Gap: 9 mEq/L (ref 3–11)
BUN: 19.4 mg/dL (ref 7.0–26.0)
CHLORIDE: 110 meq/L — AB (ref 98–109)
CO2: 21 mEq/L — ABNORMAL LOW (ref 22–29)
Calcium: 8.9 mg/dL (ref 8.4–10.4)
Creatinine: 0.9 mg/dL (ref 0.7–1.3)
EGFR: >90 mL/min/{1.73_m2} (ref >90)
GLUCOSE: 91 mg/dL (ref 70–140)
POTASSIUM: 4.3 meq/L (ref 3.5–5.1)
SODIUM: 140 meq/L (ref 136–145)
Total Bilirubin: 0.34 mg/dL (ref 0.20–1.20)
Total Protein: 6.9 g/dL (ref 6.4–8.3)

## 2015-03-12 MED ORDER — FAMOTIDINE 20 MG PO TABS
20.0000 mg | ORAL_TABLET | Freq: Once | ORAL | Status: AC
  Administered 2015-03-12: 20 mg via ORAL

## 2015-03-12 MED ORDER — FAMOTIDINE IN NACL 20-0.9 MG/50ML-% IV SOLN
INTRAVENOUS | Status: AC
  Filled 2015-03-12: qty 50

## 2015-03-12 MED ORDER — FAMOTIDINE 20 MG PO TABS
ORAL_TABLET | ORAL | Status: AC
  Filled 2015-03-12: qty 1

## 2015-03-12 MED ORDER — SODIUM CHLORIDE 0.9 % IV SOLN
Freq: Once | INTRAVENOUS | Status: AC
  Administered 2015-03-12: 13:00:00 via INTRAVENOUS

## 2015-03-12 MED ORDER — SODIUM CHLORIDE 0.9 % IV SOLN
200.0000 mg | Freq: Once | INTRAVENOUS | Status: AC
  Administered 2015-03-12: 200 mg via INTRAVENOUS
  Filled 2015-03-12: qty 8

## 2015-03-12 NOTE — Telephone Encounter (Signed)
per pof to sch pt appt-gave pt copy of avs-sent MW email to sch trmt-pt to get updated b4 leAving trmt

## 2015-03-12 NOTE — Patient Instructions (Signed)
[facility] Discharge Instructions for Patients Receiving Chemotherapy  Today you received the following chemotherapy agents Keytruda  To help prevent nausea and vomiting after your treatment, we encourage you to take your nausea medication    If you develop nausea and vomiting that is not controlled by your nausea medication, call the clinic.   BELOW ARE SYMPTOMS THAT SHOULD BE REPORTED IMMEDIATELY:  *FEVER GREATER THAN 100.5 F  *CHILLS WITH OR WITHOUT FEVER  NAUSEA AND VOMITING THAT IS NOT CONTROLLED WITH YOUR NAUSEA MEDICATION  *UNUSUAL SHORTNESS OF BREATH  *UNUSUAL BRUISING OR BLEEDING  TENDERNESS IN MOUTH AND THROAT WITH OR WITHOUT PRESENCE OF ULCERS  *URINARY PROBLEMS  *BOWEL PROBLEMS  UNUSUAL RASH Items with * indicate a potential emergency and should be followed up as soon as possible.  Feel free to call the clinic you have any questions or concerns. The clinic phone number is [phone].  Please show the CHEMO ALERT CARD at check-in to the Emergency Department and triage nurse.   

## 2015-03-12 NOTE — Progress Notes (Signed)
[facility] [phone]   Fax:(336) [phone]  OFFICE PROGRESS NOTE  FRIED, Timothy Graff, MD [address] [place] [address]  DIAGNOSIS: Stage IV (T2a, N1, M1b) non-small cell lung cancer, adenocarcinoma, negative EGFR mutation but equivocal EGFR amplification, negative ALK gene translocation and negative ROS 1but with PDL-1 expression 100% presented with left lower lobe lung mass in addition to left hilar adenopathy and solitary metastatic brain lesion diagnosed in December 2016.  PRIOR THERAPY:  1) status post stereotactic radiotherapy and surgical resection diagnosed in December 2016. 2) status post video bronchoscopy with left VATS and wedge resection of the left upper lobe and left lower lobe superior segmentectomy with lymph node dissection under the care of Dr. Servando Snare on 01/14/2015.  CURRENT THERAPY: Immunotherapy with Ketruda (pembrolizumab) 200 MG IV every 3 weeks, first dose 02/18/2015. Status post one cycle.  INTERVAL HISTORY: Timothy Obrien 55 y.o. male returns to the clinic today for follow-up visit.  The patient tolerated the first cycle of his treatment with immunotherapy with Ketruda (pembrolizumab) fairly well with no significant adverse effects. Has some dry skin and itching but no significant skin rash. He denied having any diarrhea. He has no nausea or vomiting, no fever or chills. He continues to have mild pain on the left side of the chest at the surgical scar but no significant shortness breath, cough or hemoptysis.Marland Kitchen He denied having any significant shortness of breath, cough or hemoptysis. He denied having any fever or chills. He is today for reevaluation before starting cycle #2.  MEDICAL HISTORY: Past Medical History  Diagnosis Date  . Lung cancer ([facility])     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  . Pneumonia   . Bronchitis   . Tobacco abuse   . Seizures ([place])     last 12/11/14  . Shortness of breath dyspnea     with  exertion    ALLERGIES:  has No Known Allergies.  MEDICATIONS:  Current Outpatient Prescriptions  Medication Sig Dispense Refill  . ibuprofen (ADVIL,MOTRIN) 200 MG tablet Take 200 mg by mouth every 6 (six) hours as needed.    . levETIRAcetam (KEPPRA) 500 MG tablet Take 1 tablet (500 mg total) by mouth every morning. (Patient taking differently: Take 500 mg by mouth every evening. ) 30 tablet 1  . mirtazapine (REMERON) 30 MG tablet Take 1 tablet (30 mg total) by mouth at bedtime. 30 tablet 2  . oxyCODONE (OXY IR/ROXICODONE) 5 MG immediate release tablet Take 1-2 tablets (5-10 mg total) by mouth every 6 (six) hours as needed for moderate pain or severe pain. 40 tablet 0   No current facility-administered medications for this visit.    SURGICAL HISTORY:  Past Surgical History  Procedure Laterality Date  . Hernia repair    . Vasectomy    . Video bronchoscopy Bilateral 12/16/2014    Procedure: VIDEO BRONCHOSCOPY WITH FLUORO;  Surgeon: Collene Gobble, MD;  Location: [facility];  Service: Cardiopulmonary;  Laterality: Bilateral;  . Craniotomy N/A 12/26/2014    Procedure: CRANIOTOMY TUMOR EXCISION with BrainLab;  Surgeon: Kevan Ny Ditty, MD;  Location: [facility];  Service: Neurosurgery;  Laterality: N/A;  CRANIOTOMY TUMOR EXCISION with Stealth  . Application of cranial navigation N/A 12/26/2014    Procedure: APPLICATION OF CRANIAL NAVIGATION;  Surgeon: Kevan Ny Ditty, MD;  Location: [facility];  Service: Neurosurgery;  Laterality: N/A;  . Video bronchoscopy N/A 01/14/2015  Procedure: VIDEO BRONCHOSCOPY;  Surgeon: Delight Ovens, MD;  Location: [facility];  Service: Thoracic;  Laterality: N/A;  . Video assisted thoracoscopy (vats)/wedge resection Left 01/14/2015    Procedure: VIDEO ASSISTED THORACOSCOPY (VATS)/WEDGE RESECTION, Superior segmentectomy left  lower lobe, wedge resection of left upper lobe, multiple lymph node disection, On Q insertion.;  Surgeon: Delight Ovens, MD;   Location: [facility];  Service: Thoracic;  Laterality: Left;    REVIEW OF SYSTEMS:  A comprehensive review of systems was negative except for: Constitutional: positive for fatigue Integument/breast: positive for dryness   PHYSICAL EXAMINATION: General appearance: alert, cooperative, fatigued and no distress Head: Normocephalic, without obvious abnormality, atraumatic Neck: no adenopathy, no JVD, supple, symmetrical, trachea midline and thyroid not enlarged, symmetric, no tenderness/mass/nodules Lymph nodes: Cervical, supraclavicular, and axillary nodes normal. Resp: clear to auscultation bilaterally Back: symmetric, no curvature. ROM normal. No CVA tenderness. Cardio: regular rate and rhythm, S1, S2 normal, no murmur, click, rub or gallop GI: soft, non-tender; bowel sounds normal; no masses,  no organomegaly Extremities: extremities normal, atraumatic, no cyanosis or edema Neurologic: Alert and oriented X 3, normal strength and tone. Normal symmetric reflexes. Normal coordination and gait  ECOG PERFORMANCE STATUS: 1 - Symptomatic but completely ambulatory  Blood pressure 118/70, pulse 74, temperature 97.4 F (36.3 C), resp. rate 18, height 6' (1.829 m), weight 157 lb 3.2 oz (71.305 kg), SpO2 99 %.  LABORATORY DATA: Lab Results  Component Value Date   WBC 11.1* 03/12/2015   HGB 15.0 03/12/2015   HCT 44.2 03/12/2015   MCV 95.7 03/12/2015   PLT 290 03/12/2015      Chemistry      Component Value Date/Time   NA 140 03/12/2015 1133   NA 138 01/16/2015 0450   K 4.3 03/12/2015 1133   K 4.1 01/16/2015 0450   CL 109 01/16/2015 0450   CO2 21* 03/12/2015 1133   CO2 22 01/16/2015 0450   BUN 19.4 03/12/2015 1133   BUN 6 01/16/2015 0450   CREATININE 0.9 03/12/2015 1133   CREATININE 0.68 01/16/2015 0450      Component Value Date/Time   CALCIUM 8.9 03/12/2015 1133   CALCIUM 8.3* 01/16/2015 0450   ALKPHOS 133 03/12/2015 1133   ALKPHOS 60 01/16/2015 0450   AST 57* 03/12/2015 1133   AST  31 01/16/2015 0450   ALT 105* 03/12/2015 1133   ALT 26 01/16/2015 0450   BILITOT 0.34 03/12/2015 1133   BILITOT 0.9 01/16/2015 0450       RADIOGRAPHIC STUDIES: Ct Abdomen Pelvis W Contrast  02/16/2015  CLINICAL DATA:  Lung cancer, brain metastatic disease. Partial lung resection. Elevated liver function tests. Radiation therapy to the brain completed. Immunotherapy is planned. EXAM: CT ABDOMEN AND PELVIS WITH CONTRAST TECHNIQUE: Multidetector CT imaging of the abdomen and pelvis was performed using the standard protocol following bolus administration of intravenous contrast. CONTRAST:  OMNIPAQUE IOHEXOL 300 MG/ML  SOLN COMPARISON:  Multiple exams, including 12/24/2014 FINDINGS: Lower chest: Bandlike atelectasis/ scarring medially in the left lower lobe with some associated volume loss. Much of this appearance is likely postoperative. Hepatobiliary: 5 by 4 mm hypodense lesion posteriorly in segment 5, no change from 12/13/2014, appearance compatible with cyst. 3 mm hypodense lesion posteriorly in the lateral segment left hepatic lobe, stable and favoring cyst although technically too small to characterize. Questionable 2 mm subcapsular hypodense lesion in segment 4b, image 27 series 2, too small to characterize but probably a cyst. Gallbladder unremarkable. Pancreas: Unremarkable Spleen: Unremarkable Adrenals/Urinary  Tract: Adrenal glands normal. Stable 8 mm right kidney upper pole hypodense lesion, most likely a cyst but technically too small to characterize. Similar 0.7 by 0.5 cm lesion in the right kidney lower pole, not changed from prior. Stomach/Bowel: Sigmoid diverticulosis. Vascular/Lymphatic: Aortoiliac atherosclerotic vascular disease. No pathologic abdominal adenopathy observed. Reproductive: Unremarkable Other: No supplemental non-categorized findings. Musculoskeletal: Stable 8 mm rim sclerotic lesion in the left iliac bone near the SI joint, image 56 of series 2, not changed from  12/13/2014, and not appreciably hypermetabolic on prior PET-CT. Spurring of both femoral heads. 1.4 cm mainly sclerotic lesion in the junction of the right femoral neck and intertrochanteric region, no change from priors and not previously hypermetabolic, likely benign. Degenerative disc disease with loss of disc height and vacuum disc phenomenon at L5-S1, posterior osseous ridging contributing to borderline bilateral foraminal stenosis at this level. IMPRESSION: 1. Stable tiny hepatic and right renal lesions are statistically highly likely to represent cysts and unchanged, but may merit observation as they are technically nonspecific due to small size. 2. No specific findings of recurrence/metastatic disease to the abdomen or pelvis. 3. Several small lesions in the left iliac bone and right proximal femur are likely benign and incidental. These were not previously hypermetabolic on PET-CT, and appear unchanged. 4.  Aortoiliac atherosclerotic vascular disease. 5. Sigmoid colon diverticulosis. 6. Bandlike scarring in the left lower lobe. 7. No specific CT findings to explain elevated liver function tests. Electronically Signed   By: Van Clines M.D.   On: 02/16/2015 08:08    ASSESSMENT AND PLAN: This is a very pleasant 55 years old white male diagnosed with a stage IV non-small cell lung cancer, T2a, N1, M1b) non-small cell lung cancer presented with solitary brain metastasis status post stereotactic radiotherapy followed by craniotomy and resection of the brain tumor. The patient also underwent wedge resection of the left upper lobe as well as left lower lobe superior segmentectomy with lymph node dissection. Unfortunately the left lower lobe resection margin was positive for adenocarcinoma. His molecular study showed equivocal EGFR amplification, negative ALK gene translocation, negative Ros 1 but the immunohistochemical stains showed positive for PDL 1 expression at 100%. He is currently on treatment  with Ketruda (pembrolizumab) status post 1 cycle and tolerated the first cycle of his treatment fairly well. I recommended for the patient to proceed with cycle #2 today as a scheduled. For depression, I will start the patient on Remeron 30 mg by mouth daily at bedtime. He would come back for follow-up visit in 3 weeks for reevaluation before starting cycle #3. The patient was advised to call if he has any concerning symptoms in the interval. The patient voices understanding of current disease status and treatment options and is in agreement with the current care plan.  All questions were answered. The patient knows to call the clinic with any problems, questions or concerns. We can certainly see the patient much sooner if necessary.  Disclaimer: This note was dictated with voice recognition software. Similar sounding words can inadvertently be transcribed and may not be corrected upon review.

## 2015-03-12 NOTE — Telephone Encounter (Signed)
Per staff message and POF I have scheduled appts. Advised scheduler of appts. JMW  

## 2015-03-17 ENCOUNTER — Ambulatory Visit: Payer: BLUE CROSS/BLUE SHIELD | Admitting: Internal Medicine

## 2015-03-18 ENCOUNTER — Encounter: Payer: Self-pay | Admitting: Internal Medicine

## 2015-03-18 ENCOUNTER — Other Ambulatory Visit: Payer: Self-pay | Admitting: Neurological Surgery

## 2015-03-18 ENCOUNTER — Other Ambulatory Visit: Payer: BLUE CROSS/BLUE SHIELD

## 2015-03-18 DIAGNOSIS — C7931 Secondary malignant neoplasm of brain: Secondary | ICD-10-CM

## 2015-03-18 NOTE — Progress Notes (Signed)
Pt is approved w/ [employer] copay assistance program for Keytruda from 01/11/15 - 01/10/16 for $25,000.  Emailed copy of approval letter to [facility] in billing.

## 2015-03-19 ENCOUNTER — Other Ambulatory Visit: Payer: Self-pay | Admitting: Radiation Therapy

## 2015-03-19 DIAGNOSIS — C7931 Secondary malignant neoplasm of brain: Secondary | ICD-10-CM

## 2015-03-19 DIAGNOSIS — C7949 Secondary malignant neoplasm of other parts of nervous system: Principal | ICD-10-CM

## 2015-03-25 ENCOUNTER — Ambulatory Visit: Payer: BLUE CROSS/BLUE SHIELD

## 2015-03-25 ENCOUNTER — Ambulatory Visit: Payer: BLUE CROSS/BLUE SHIELD | Admitting: Internal Medicine

## 2015-03-25 ENCOUNTER — Other Ambulatory Visit: Payer: BLUE CROSS/BLUE SHIELD

## 2015-04-01 ENCOUNTER — Telehealth: Payer: Self-pay | Admitting: Internal Medicine

## 2015-04-01 ENCOUNTER — Encounter: Payer: Self-pay | Admitting: *Deleted

## 2015-04-01 ENCOUNTER — Encounter: Payer: Self-pay | Admitting: Internal Medicine

## 2015-04-01 ENCOUNTER — Ambulatory Visit (HOSPITAL_BASED_OUTPATIENT_CLINIC_OR_DEPARTMENT_OTHER): Payer: BLUE CROSS/BLUE SHIELD | Admitting: Internal Medicine

## 2015-04-01 ENCOUNTER — Ambulatory Visit (HOSPITAL_BASED_OUTPATIENT_CLINIC_OR_DEPARTMENT_OTHER): Payer: BLUE CROSS/BLUE SHIELD

## 2015-04-01 ENCOUNTER — Other Ambulatory Visit (HOSPITAL_BASED_OUTPATIENT_CLINIC_OR_DEPARTMENT_OTHER): Payer: BLUE CROSS/BLUE SHIELD

## 2015-04-01 VITALS — BP 131/88 | HR 68 | Temp 97.5°F | Resp 18 | Ht 72.0 in | Wt 154.5 lb

## 2015-04-01 DIAGNOSIS — Z5112 Encounter for antineoplastic immunotherapy: Secondary | ICD-10-CM | POA: Diagnosis not present

## 2015-04-01 DIAGNOSIS — C3432 Malignant neoplasm of lower lobe, left bronchus or lung: Secondary | ICD-10-CM | POA: Diagnosis not present

## 2015-04-01 DIAGNOSIS — C7931 Secondary malignant neoplasm of brain: Secondary | ICD-10-CM | POA: Diagnosis not present

## 2015-04-01 DIAGNOSIS — R531 Weakness: Secondary | ICD-10-CM

## 2015-04-01 DIAGNOSIS — E440 Moderate protein-calorie malnutrition: Secondary | ICD-10-CM

## 2015-04-01 DIAGNOSIS — F329 Major depressive disorder, single episode, unspecified: Secondary | ICD-10-CM | POA: Diagnosis not present

## 2015-04-01 DIAGNOSIS — Z9889 Other specified postprocedural states: Secondary | ICD-10-CM

## 2015-04-01 DIAGNOSIS — R5383 Other fatigue: Secondary | ICD-10-CM

## 2015-04-01 DIAGNOSIS — Z720 Tobacco use: Secondary | ICD-10-CM

## 2015-04-01 LAB — CBC WITH DIFFERENTIAL/PLATELET
BASO%: 1 % (ref 0.0–2.0)
BASOS ABS: 0.1 10*3/uL (ref 0.0–0.1)
EOS%: 5.9 % (ref 0.0–7.0)
Eosinophils Absolute: 0.6 10*3/uL — ABNORMAL HIGH (ref 0.0–0.5)
HCT: 49.3 % (ref 38.4–49.9)
HEMOGLOBIN: 16.5 g/dL (ref 13.0–17.1)
LYMPH%: 35.2 % (ref 14.0–49.0)
MCH: 32 pg (ref 27.2–33.4)
MCHC: 33.4 g/dL (ref 32.0–36.0)
MCV: 95.8 fL (ref 79.3–98.0)
MONO#: 1.1 10*3/uL — ABNORMAL HIGH (ref 0.1–0.9)
MONO%: 10.9 % (ref 0.0–14.0)
NEUT#: 4.9 10*3/uL (ref 1.5–6.5)
NEUT%: 47 % (ref 39.0–75.0)
Platelets: 289 10*3/uL (ref 140–400)
RBC: 5.14 10*6/uL (ref 4.20–5.82)
RDW: 14.7 % — ABNORMAL HIGH (ref 11.0–14.6)
WBC: 10.5 10*3/uL — ABNORMAL HIGH (ref 4.0–10.3)
lymph#: 3.7 10*3/uL — ABNORMAL HIGH (ref 0.9–3.3)

## 2015-04-01 LAB — COMPREHENSIVE METABOLIC PANEL
ALBUMIN: 4.1 g/dL (ref 3.5–5.0)
ALT: 27 U/L (ref 0–55)
AST: 25 U/L (ref 5–34)
Alkaline Phosphatase: 104 U/L (ref 40–150)
Anion Gap: 11 mEq/L (ref 3–11)
BUN: 15.7 mg/dL (ref 7.0–26.0)
CHLORIDE: 106 meq/L (ref 98–109)
CO2: 21 mEq/L — ABNORMAL LOW (ref 22–29)
Calcium: 9.3 mg/dL (ref 8.4–10.4)
Creatinine: 0.8 mg/dL (ref 0.7–1.3)
EGFR: >90 mL/min/{1.73_m2} (ref >90)
GLUCOSE: 99 mg/dL (ref 70–140)
POTASSIUM: 3.9 meq/L (ref 3.5–5.1)
SODIUM: 138 meq/L (ref 136–145)
Total Bilirubin: 0.57 mg/dL (ref 0.20–1.20)
Total Protein: 7.6 g/dL (ref 6.4–8.3)

## 2015-04-01 MED ORDER — SODIUM CHLORIDE 0.9 % IV SOLN
Freq: Once | INTRAVENOUS | Status: AC
  Administered 2015-04-01: 11:00:00 via INTRAVENOUS

## 2015-04-01 MED ORDER — SODIUM CHLORIDE 0.9 % IV SOLN
200.0000 mg | Freq: Once | INTRAVENOUS | Status: AC
  Administered 2015-04-01: 200 mg via INTRAVENOUS
  Filled 2015-04-01: qty 8

## 2015-04-01 MED ORDER — FAMOTIDINE 20 MG PO TABS: 20.0000 mg | ORAL_TABLET | Freq: Once | ORAL | Status: DC

## 2015-04-01 MED ORDER — LEVETIRACETAM 500 MG PO TABS: 500.0000 mg | ORAL_TABLET | Freq: Every evening | ORAL | Status: DC

## 2015-04-01 MED ORDER — MIRTAZAPINE 30 MG PO TABS: 30.0000 mg | ORAL_TABLET | Freq: Every day | ORAL | Status: DC

## 2015-04-01 NOTE — Progress Notes (Signed)
[facility] Telephone:(336) [phone]   Fax:(336) [phone]  OFFICE PROGRESS NOTE  Obrien, Timothy Graff, MD [address] [place] [address]  DIAGNOSIS: Stage IV (T2a, N1, M1b) non-small cell lung cancer, adenocarcinoma, negative EGFR mutation but equivocal EGFR amplification, negative ALK gene translocation and negative ROS 1but with PDL-1 expression 100% presented with left lower lobe lung mass in addition to left hilar adenopathy and solitary metastatic brain lesion diagnosed in December 2016.  PRIOR THERAPY:  1) status post stereotactic radiotherapy and surgical resection diagnosed in December 2016. 2) status post video bronchoscopy with left VATS and wedge resection of the left upper lobe and left lower lobe superior segmentectomy with lymph node dissection under the care of Dr. Servando Snare on 01/14/2015.  CURRENT THERAPY: Immunotherapy with Ketruda (pembrolizumab) 200 MG IV every 3 weeks, first dose 02/18/2015. Status post 2 cycles.  INTERVAL HISTORY: Timothy Obrien 55 y.o. male returns to the clinic today for follow-up visit.  The patient tolerated the second cycle of his treatment with immunotherapy with Ketruda (pembrolizumab) fairly well with no significant adverse effects. He continues to have dry skin and itching but no significant skin rash. She also complains of mild fatigue. He denied having any diarrhea. He has no nausea or vomiting, no fever or chills. He has no significant chest pain, shortness of breath, cough or hemoptysis.Marland Kitchen He denied having any fever or chills. He is today for reevaluation before starting cycle #3. He is requesting refill of Remeron and Keppra.  MEDICAL HISTORY: Past Medical History  Diagnosis Date  . Lung cancer ([facility])     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  . Pneumonia   . Bronchitis   . Tobacco abuse   . Seizures ([facility])     last 12/11/14  . Shortness of breath dyspnea     with exertion    ALLERGIES:  has No Known  Allergies.  MEDICATIONS:  Current Outpatient Prescriptions  Medication Sig Dispense Refill  . ibuprofen (ADVIL,MOTRIN) 200 MG tablet Take 200 mg by mouth every 6 (six) hours as needed.    . levETIRAcetam (KEPPRA) 500 MG tablet Take 1 tablet (500 mg total) by mouth every morning. (Patient taking differently: Take 500 mg by mouth every evening. ) 30 tablet 1  . mirtazapine (REMERON) 30 MG tablet Take 1 tablet (30 mg total) by mouth at bedtime. 30 tablet 2  . oxyCODONE (OXY IR/ROXICODONE) 5 MG immediate release tablet Take 1-2 tablets (5-10 mg total) by mouth every 6 (six) hours as needed for moderate pain or severe pain. (Patient not taking: Reported on 04/01/2015) 40 tablet 0   No current facility-administered medications for this visit.    SURGICAL HISTORY:  Past Surgical History  Procedure Laterality Date  . Hernia repair    . Vasectomy    . Video bronchoscopy Bilateral 12/16/2014    Procedure: VIDEO BRONCHOSCOPY WITH FLUORO;  Surgeon: Collene Gobble, MD;  Location: [facility];  Service: Cardiopulmonary;  Laterality: Bilateral;  . Craniotomy N/A 12/26/2014    Procedure: CRANIOTOMY TUMOR EXCISION with BrainLab;  Surgeon: Kevan Ny Ditty, MD;  Location: [facility];  Service: Neurosurgery;  Laterality: N/A;  CRANIOTOMY TUMOR EXCISION with Stealth  . Application of cranial navigation N/A 12/26/2014    Procedure: APPLICATION OF CRANIAL NAVIGATION;  Surgeon: Kevan Ny Ditty, MD;  Location: [facility];  Service: Neurosurgery;  Laterality: N/A;  . Video bronchoscopy N/A 01/14/2015    Procedure: VIDEO  BRONCHOSCOPY;  Surgeon: Grace Isaac, MD;  Location: [facility];  Service: Thoracic;  Laterality: N/A;  . Video assisted thoracoscopy (vats)/wedge resection Left 01/14/2015    Procedure: VIDEO ASSISTED THORACOSCOPY (VATS)/WEDGE RESECTION, Superior segmentectomy left  lower lobe, wedge resection of left upper lobe, multiple lymph node disection, On Q insertion.;  Surgeon: Grace Isaac,  MD;  Location: [facility];  Service: Thoracic;  Laterality: Left;    REVIEW OF SYSTEMS:  A comprehensive review of systems was negative except for: Constitutional: positive for fatigue Integument/breast: positive for dryness   PHYSICAL EXAMINATION: General appearance: alert, cooperative, fatigued and no distress Head: Normocephalic, without obvious abnormality, atraumatic Neck: no adenopathy, no JVD, supple, symmetrical, trachea midline and thyroid not enlarged, symmetric, no tenderness/mass/nodules Lymph nodes: Cervical, supraclavicular, and axillary nodes normal. Resp: clear to auscultation bilaterally Back: symmetric, no curvature. ROM normal. No CVA tenderness. Cardio: regular rate and rhythm, S1, S2 normal, no murmur, click, rub or gallop GI: soft, non-tender; bowel sounds normal; no masses,  no organomegaly Extremities: extremities normal, atraumatic, no cyanosis or edema Neurologic: Alert and oriented X 3, normal strength and tone. Normal symmetric reflexes. Normal coordination and gait  ECOG PERFORMANCE STATUS: 1 - Symptomatic but completely ambulatory  Blood pressure 131/88, pulse 68, temperature 97.5 F (36.4 C), temperature source Oral, resp. rate 18, height 6' (1.829 m), weight 154 lb 8 oz (70.081 kg), SpO2 100 %.  LABORATORY DATA: Lab Results  Component Value Date   WBC 10.5* 04/01/2015   HGB 16.5 04/01/2015   HCT 49.3 04/01/2015   MCV 95.8 04/01/2015   PLT 289 04/01/2015      Chemistry      Component Value Date/Time   NA 140 03/12/2015 1133   NA 138 01/16/2015 0450   K 4.3 03/12/2015 1133   K 4.1 01/16/2015 0450   CL 109 01/16/2015 0450   CO2 21* 03/12/2015 1133   CO2 22 01/16/2015 0450   BUN 19.4 03/12/2015 1133   BUN 6 01/16/2015 0450   CREATININE 0.9 03/12/2015 1133   CREATININE 0.68 01/16/2015 0450      Component Value Date/Time   CALCIUM 8.9 03/12/2015 1133   CALCIUM 8.3* 01/16/2015 0450   ALKPHOS 133 03/12/2015 1133   ALKPHOS 60 01/16/2015 0450   AST  57* 03/12/2015 1133   AST 31 01/16/2015 0450   ALT 105* 03/12/2015 1133   ALT 26 01/16/2015 0450   BILITOT 0.34 03/12/2015 1133   BILITOT 0.9 01/16/2015 0450       RADIOGRAPHIC STUDIES: No results found.  ASSESSMENT AND PLAN: This is a very pleasant 55 years old white male diagnosed with a stage IV non-small cell lung cancer, T2a, N1, M1b) non-small cell lung cancer presented with solitary brain metastasis status post stereotactic radiotherapy followed by craniotomy and resection of the brain tumor. The patient also underwent wedge resection of the left upper lobe as well as left lower lobe superior segmentectomy with lymph node dissection. Unfortunately the left lower lobe resection margin was positive for adenocarcinoma. His molecular study showed equivocal EGFR amplification, negative ALK gene translocation, negative Ros 1 but the immunohistochemical stains showed positive for PDL 1 expression at 100%. He is currently on treatment with Ketruda (pembrolizumab) status post 2 cycles and tolerated the first 2 cycles of his treatment fairly well except for the dry skin and fatigue. I recommended for the patient to proceed with cycle #3 today as a scheduled. For depression, he will continue on Remeron 30 mg by mouth daily  at bedtime. He would come back for follow-up visit in 3 weeks for reevaluation with repeat CT scan of the chest, abdomen and pelvis for restaging of his disease before starting cycle #4. The patient was advised to call if he has any concerning symptoms in the interval. The patient voices understanding of current disease status and treatment options and is in agreement with the current care plan.  All questions were answered. The patient knows to call the clinic with any problems, questions or concerns. We can certainly see the patient much sooner if necessary.  Disclaimer: This note was dictated with voice recognition software. Similar sounding words can inadvertently be  transcribed and may not be corrected upon review.

## 2015-04-01 NOTE — Progress Notes (Signed)
Oncology Nurse Navigator Documentation  Oncology Nurse Navigator Flowsheets 04/01/2015  Navigator Location [facility]  Navigator Encounter Type Treatment  Abnormal Finding Date 12/12/2014  Confirmed Diagnosis Date 12/16/2014  Surgery Date 12/26/2014  Treatment Initiated Date 12/26/2014  Patient Visit Type MedOnc  Treatment Phase Treatment  Barriers/Navigation Needs Education  Education Pain/ Symptom Management  Interventions Education Method  Education Method Verbal  Acuity Level 2  Acuity Level 2 Ongoing guidance and education throughout treatment as needed  Time Spent with Patient 30   Spoke with patient today in chemo.  I asked how he was feeling.  I listened as he explained.  I spoke to him about side effects of IO therapy.  He stated he has not had any side effects.  I again listened as he explained.  It is great seeing Timothy Obrien doing so well with tx.

## 2015-04-01 NOTE — Telephone Encounter (Signed)
Gave patient avs report and appointments for April/May. Central radiology scheduling will contact patient re ct - patient aware.

## 2015-04-01 NOTE — Patient Instructions (Signed)
Pembrolizumab injection What is this medicine? PEMBROLIZUMAB (pem broe liz ue mab) is a monoclonal antibody. It is used to treat melanoma and non-small cell lung cancer. This medicine may be used for other purposes; ask your health care provider or pharmacist if you have questions. What should I tell my health care provider before I take this medicine? They need to know if you have any of these conditions: -diabetes -immune system problems -inflammatory bowel disease -liver disease -lung or breathing disease -lupus -an unusual or allergic reaction to pembrolizumab, other medicines, foods, dyes, or preservatives -pregnant or trying to get pregnant -breast-feeding How should I use this medicine? This medicine is for infusion into a vein. It is given by a health care professional in a hospital or clinic setting. A special MedGuide will be given to you before each treatment. Be sure to read this information carefully each time. Talk to your pediatrician regarding the use of this medicine in children. Special care may be needed. Overdosage: If you think you have taken too much of this medicine contact a poison control center or emergency room at once. NOTE: This medicine is only for you. Do not share this medicine with others. What if I miss a dose? It is important not to miss your dose. Call your doctor or health care professional if you are unable to keep an appointment. What may interact with this medicine? Interactions have not been studied. Give your health care provider a list of all the medicines, herbs, non-prescription drugs, or dietary supplements you use. Also tell them if you smoke, drink alcohol, or use illegal drugs. Some items may interact with your medicine. This list may not describe all possible interactions. Give your health care provider a list of all the medicines, herbs, non-prescription drugs, or dietary supplements you use. Also tell them if you smoke, drink alcohol, or  use illegal drugs. Some items may interact with your medicine. What should I watch for while using this medicine? Your condition will be monitored carefully while you are receiving this medicine. You may need blood work done while you are taking this medicine. Do not become pregnant while taking this medicine or for 4 months after stopping it. Women should inform their doctor if they wish to become pregnant or think they might be pregnant. There is a potential for serious side effects to an unborn child. Talk to your health care professional or pharmacist for more information. Do not breast-feed an infant while taking this medicine or for 4 months after the last dose. What side effects may I notice from receiving this medicine? Side effects that you should report to your doctor or health care professional as soon as possible: -allergic reactions like skin rash, itching or hives, swelling of the face, lips, or tongue -bloody or black, tarry stools -breathing problems -change in the amount of urine -changes in vision -chest pain -chills -dark urine -dizziness or feeling faint or lightheaded -fast or irregular heartbeat -fever -flushing -hair loss -muscle pain -muscle weakness -persistent headache -signs and symptoms of high blood sugar such as dizziness; dry mouth; dry skin; fruity breath; nausea; stomach pain; increased hunger or thirst; increased urination -signs and symptoms of liver injury like dark urine, light-colored stools, loss of appetite, nausea, right upper belly pain, yellowing of the eyes or skin -stomach pain -weight loss Side effects that usually do not require medical attention (Report these to your doctor or health care professional if they continue or are bothersome.):constipation -cough -diarrhea -joint pain -  tiredness This list may not describe all possible side effects. Call your doctor for medical advice about side effects. You may report side effects to FDA at  1-800-FDA-1088. Where should I keep my medicine? This drug is given in a hospital or clinic and will not be stored at home. NOTE: This sheet is a summary. It may not cover all possible information. If you have questions about this medicine, talk to your doctor, pharmacist, or health care provider.    2016, [employer]vier/[employer] Standard. (2014-02-25 17:24:19)

## 2015-04-06 ENCOUNTER — Other Ambulatory Visit: Payer: BLUE CROSS/BLUE SHIELD

## 2015-04-07 ENCOUNTER — Ambulatory Visit
Admission: RE | Admit: 2015-04-07 | Discharge: 2015-04-07 | Disposition: A | Discharge status: Home / Self Care | Payer: BLUE CROSS/BLUE SHIELD | Source: Ambulatory Visit | Attending: Neurological Surgery | Admitting: Neurological Surgery

## 2015-04-07 DIAGNOSIS — C7931 Secondary malignant neoplasm of brain: Secondary | ICD-10-CM

## 2015-04-07 MED ORDER — GADOBENATE DIMEGLUMINE 529 MG/ML IV SOLN
14.0000 mL | Freq: Once | INTRAVENOUS | Status: AC | PRN
  Administered 2015-04-07: 14 mL via INTRAVENOUS

## 2015-04-08 ENCOUNTER — Other Ambulatory Visit: Payer: BLUE CROSS/BLUE SHIELD

## 2015-04-09 ENCOUNTER — Encounter: Payer: BLUE CROSS/BLUE SHIELD | Admitting: Cardiothoracic Surgery

## 2015-04-09 ENCOUNTER — Ambulatory Visit: Payer: Self-pay | Admitting: Radiation Oncology

## 2015-04-14 ENCOUNTER — Other Ambulatory Visit: Payer: Self-pay | Admitting: Internal Medicine

## 2015-04-15 ENCOUNTER — Other Ambulatory Visit: Payer: BLUE CROSS/BLUE SHIELD

## 2015-04-15 ENCOUNTER — Other Ambulatory Visit: Payer: Self-pay | Admitting: Cardiothoracic Surgery

## 2015-04-15 ENCOUNTER — Ambulatory Visit: Payer: BLUE CROSS/BLUE SHIELD

## 2015-04-15 ENCOUNTER — Ambulatory Visit: Payer: BLUE CROSS/BLUE SHIELD | Admitting: Internal Medicine

## 2015-04-15 DIAGNOSIS — C3490 Malignant neoplasm of unspecified part of unspecified bronchus or lung: Secondary | ICD-10-CM

## 2015-04-16 ENCOUNTER — Ambulatory Visit
Admission: RE | Admit: 2015-04-16 | Discharge: 2015-04-16 | Disposition: A | Discharge status: Home / Self Care | Payer: BLUE CROSS/BLUE SHIELD | Source: Ambulatory Visit | Attending: Cardiothoracic Surgery | Admitting: Cardiothoracic Surgery

## 2015-04-16 ENCOUNTER — Ambulatory Visit (INDEPENDENT_AMBULATORY_CARE_PROVIDER_SITE_OTHER): Payer: Self-pay | Admitting: Cardiothoracic Surgery

## 2015-04-16 ENCOUNTER — Encounter: Payer: Self-pay | Admitting: Cardiothoracic Surgery

## 2015-04-16 VITALS — BP 124/82 | HR 69 | Resp 20 | Ht 72.0 in | Wt 160.0 lb

## 2015-04-16 DIAGNOSIS — C3432 Malignant neoplasm of lower lobe, left bronchus or lung: Secondary | ICD-10-CM

## 2015-04-16 DIAGNOSIS — C3490 Malignant neoplasm of unspecified part of unspecified bronchus or lung: Secondary | ICD-10-CM

## 2015-04-16 NOTE — Progress Notes (Signed)
[address]       [place] [address]             [id number]      Timothy Obrien Medical Record #[id number] Date of Birth: 15-Apr-1960  Referring: Curt Bears, MD Primary Care: Abigail Miyamoto, MD  Chief Complaint:   POST OP FOLLOW UP 01/14/2015 OPERATIVE REPORT PREOPERATIVE DIAGNOSIS: Lung mass, superior segment, left lower lobe with isolated brain metastasis from adenocarcinoma. POSTOPERATIVE DIAGNOSIS: Lung mass, superior segment, left lower lobe with isolated brain metastasis from adenocarcinoma. SURGICAL PROCEDURE: Bronchoscopy, left video-assisted thoracoscopy, left superior segmentectomy with wedge resection of left upper lobe mediastinal and lymph node dissection and placement of On-Q device. SURGEON: Lanelle Bal, MD  History of Present Illness:      DIAGNOSIS: Stage IV (T2a, N1, M1b) non-small cell lung cancer, adenocarcinoma, negative EGFR mutation but equivocal EGFR amplification, negative ALK gene translocation and negative ROS 1but with PDL-1 expression 100% presented with left lower lobe lung mass in addition to left hilar adenopathy and solitary metastatic brain lesion diagnosed in December 2016.  PRIOR THERAPY:  1) status post stereotactic radiotherapy and surgical resection diagnosed in December 2016. 2) status post video bronchoscopy with left VATS and wedge resection of the left upper lobe and left lower lobe superior segmentectomy with lymph node dissection under the care of Dr. Servando Snare on 01/14/2015.  CURRENT THERAPY: Immunotherapy with Ketruda (pembrolizumab) 200 MG IV every 3 weeks, first dose 02/11/2015  Patient doing well, post op, denies any respiratory difficulty.    Past Medical History  Diagnosis Date  . Lung cancer ([place])     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  . Pneumonia   . Bronchitis   . Tobacco abuse   . Seizures ([place])     last 12/11/14  . Shortness of breath dyspnea     with  exertion     History  Smoking status  . Current Every Day Smoker -- 1.00 packs/day for 36 years  . Types: Cigarettes  . Start date: 12/14/1979  Smokeless tobacco  . Never Used    History  Alcohol Use  . 21.0 oz/week  . 35 Cans of beer, 0 Standard drinks or equivalent per week    Comment: daily 6 beers a day for the past fefw years.      No Known Allergies  Current Outpatient Prescriptions  Medication Sig Dispense Refill  . levETIRAcetam (KEPPRA) 500 MG tablet Take 1 tablet (500 mg total) by mouth every evening. 30 tablet 1  . mirtazapine (REMERON) 30 MG tablet Take 1 tablet (30 mg total) by mouth at bedtime. 30 tablet 2   No current facility-administered medications for this visit.       Physical Exam: BP 124/82 mmHg  Pulse 69  Resp 20  Ht 6' (1.829 m)  Wt 160 lb (72.576 kg)  BMI 21.70 kg/m2  SpO2 98%  General appearance: alert and cooperative Neurologic: intact Heart: regular rate and rhythm, S1, S2 normal, no murmur, click, rub or gallop Lungs: clear to auscultation bilaterally Abdomen: soft, non-tender; bowel sounds normal; no masses,  no organomegaly Extremities: extremities normal, atraumatic, no cyanosis or edema and Homans sign is negative, no sign of DVT Wound: chest incisions healing well   Diagnostic Studies & Laboratory data:     Recent Radiology Findings:   Dg Chest 2 View  04/16/2015  CLINICAL DATA:  Post wedge resection and left upper lobe resection and  left lower lobe superior segmentectomy for adenocarcinoma, followup EXAM: CHEST  2 VIEW COMPARISON:  Chest x-ray of 02/05/2015 FINDINGS: No active infiltrate or effusion is seen. Mediastinal and hilar contours are unremarkable. Old healed fractures of the left posteriorly seventh and eighth ribs are noted. The heart is within normal limits in size. No acute bony abnormality is seen IMPRESSION: No active lung disease. No evidence of recurrence of lung carcinoma. Electronically Signed   By: Ivar Drape  M.D.   On: 04/16/2015 11:56      Recent Lab Findings: Lab Results  Component Value Date   WBC 10.5* 04/01/2015   HGB 16.5 04/01/2015   HCT 49.3 04/01/2015   PLT 289 04/01/2015   GLUCOSE 99 04/01/2015   ALT 27 04/01/2015   AST 25 04/01/2015   NA 138 04/01/2015   K 3.9 04/01/2015   CL 109 01/16/2015   CREATININE 0.8 04/01/2015   BUN 15.7 04/01/2015   CO2 21* 04/01/2015   TSH 0.378 02/11/2015   INR 0.94 01/14/2015      Assessment / Plan:   Stable following resection of superior segment left  and portion of left upper lobe, lower margin negative on frozen section positive on final path, n1 positive nodes.  Stared on   immune therapy per Dr Julien Nordmann  Patient is closely followed in medical oncology clinic, on Keytruda  to every 3 weeks Will see as needed at Dr. Earlie Server or the patient's request        Grace Isaac MD      [address] [place] [address] Office [phone]   Beeper [phone]  04/16/2015 12:45 PM

## 2015-04-20 ENCOUNTER — Ambulatory Visit (HOSPITAL_COMMUNITY)
Admission: RE | Admit: 2015-04-20 | Discharge: 2015-04-20 | Disposition: A | Discharge status: Home / Self Care | Payer: BLUE CROSS/BLUE SHIELD | Source: Ambulatory Visit | Attending: Internal Medicine | Admitting: Internal Medicine

## 2015-04-20 ENCOUNTER — Encounter (HOSPITAL_COMMUNITY): Payer: Self-pay

## 2015-04-20 DIAGNOSIS — Z79899 Other long term (current) drug therapy: Secondary | ICD-10-CM | POA: Diagnosis not present

## 2015-04-20 DIAGNOSIS — J432 Centrilobular emphysema: Secondary | ICD-10-CM | POA: Insufficient documentation

## 2015-04-20 DIAGNOSIS — C7931 Secondary malignant neoplasm of brain: Secondary | ICD-10-CM | POA: Diagnosis present

## 2015-04-20 DIAGNOSIS — I2510 Atherosclerotic heart disease of native coronary artery without angina pectoris: Secondary | ICD-10-CM | POA: Diagnosis not present

## 2015-04-20 DIAGNOSIS — N289 Disorder of kidney and ureter, unspecified: Secondary | ICD-10-CM | POA: Insufficient documentation

## 2015-04-20 DIAGNOSIS — Z9889 Other specified postprocedural states: Secondary | ICD-10-CM | POA: Insufficient documentation

## 2015-04-20 DIAGNOSIS — R918 Other nonspecific abnormal finding of lung field: Secondary | ICD-10-CM | POA: Insufficient documentation

## 2015-04-20 DIAGNOSIS — C3432 Malignant neoplasm of lower lobe, left bronchus or lung: Secondary | ICD-10-CM | POA: Diagnosis present

## 2015-04-20 MED ORDER — IOPAMIDOL (ISOVUE-300) INJECTION 61%
100.0000 mL | Freq: Once | INTRAVENOUS | Status: AC | PRN
  Administered 2015-04-20: 100 mL via INTRAVENOUS

## 2015-04-23 ENCOUNTER — Encounter: Payer: Self-pay | Admitting: Internal Medicine

## 2015-04-23 ENCOUNTER — Encounter: Payer: Self-pay | Admitting: *Deleted

## 2015-04-23 ENCOUNTER — Other Ambulatory Visit (HOSPITAL_BASED_OUTPATIENT_CLINIC_OR_DEPARTMENT_OTHER): Payer: BLUE CROSS/BLUE SHIELD

## 2015-04-23 ENCOUNTER — Telehealth: Payer: Self-pay | Admitting: Internal Medicine

## 2015-04-23 ENCOUNTER — Ambulatory Visit (HOSPITAL_BASED_OUTPATIENT_CLINIC_OR_DEPARTMENT_OTHER): Payer: BLUE CROSS/BLUE SHIELD

## 2015-04-23 ENCOUNTER — Ambulatory Visit (HOSPITAL_BASED_OUTPATIENT_CLINIC_OR_DEPARTMENT_OTHER): Payer: BLUE CROSS/BLUE SHIELD | Admitting: Internal Medicine

## 2015-04-23 VITALS — BP 127/96 | HR 62 | Temp 97.8°F | Resp 18 | Ht 72.0 in | Wt 160.9 lb

## 2015-04-23 DIAGNOSIS — C3432 Malignant neoplasm of lower lobe, left bronchus or lung: Secondary | ICD-10-CM

## 2015-04-23 DIAGNOSIS — Z5112 Encounter for antineoplastic immunotherapy: Secondary | ICD-10-CM

## 2015-04-23 DIAGNOSIS — C7931 Secondary malignant neoplasm of brain: Secondary | ICD-10-CM

## 2015-04-23 DIAGNOSIS — Z720 Tobacco use: Secondary | ICD-10-CM

## 2015-04-23 DIAGNOSIS — R911 Solitary pulmonary nodule: Secondary | ICD-10-CM | POA: Diagnosis not present

## 2015-04-23 DIAGNOSIS — F329 Major depressive disorder, single episode, unspecified: Secondary | ICD-10-CM | POA: Diagnosis not present

## 2015-04-23 DIAGNOSIS — E440 Moderate protein-calorie malnutrition: Secondary | ICD-10-CM

## 2015-04-23 DIAGNOSIS — R531 Weakness: Secondary | ICD-10-CM

## 2015-04-23 DIAGNOSIS — Z9889 Other specified postprocedural states: Secondary | ICD-10-CM

## 2015-04-23 LAB — CBC WITH DIFFERENTIAL/PLATELET
BASO%: 0.4 % (ref 0.0–2.0)
Basophils Absolute: 0 10*3/uL (ref 0.0–0.1)
EOS%: 9.2 % — ABNORMAL HIGH (ref 0.0–7.0)
Eosinophils Absolute: 0.8 10*3/uL — ABNORMAL HIGH (ref 0.0–0.5)
HCT: 46.4 % (ref 38.4–49.9)
HGB: 15.2 g/dL (ref 13.0–17.1)
LYMPH%: 28.5 % (ref 14.0–49.0)
MCH: 31.8 pg (ref 27.2–33.4)
MCHC: 32.8 g/dL (ref 32.0–36.0)
MCV: 97.1 fL (ref 79.3–98.0)
MONO#: 0.9 10*3/uL (ref 0.1–0.9)
MONO%: 10.6 % (ref 0.0–14.0)
NEUT#: 4.2 10*3/uL (ref 1.5–6.5)
NEUT%: 51.3 % (ref 39.0–75.0)
Platelets: 262 10*3/uL (ref 140–400)
RBC: 4.77 10*6/uL (ref 4.20–5.82)
RDW: 15 % — ABNORMAL HIGH (ref 11.0–14.6)
WBC: 8.3 10*3/uL (ref 4.0–10.3)
lymph#: 2.3 10*3/uL (ref 0.9–3.3)

## 2015-04-23 LAB — COMPREHENSIVE METABOLIC PANEL
ALT: 15 U/L (ref 0–55)
AST: 22 U/L (ref 5–34)
Albumin: 3.7 g/dL (ref 3.5–5.0)
Alkaline Phosphatase: 89 U/L (ref 40–150)
Anion Gap: 11 mEq/L (ref 3–11)
BILIRUBIN TOTAL: 0.31 mg/dL (ref 0.20–1.20)
BUN: 12.7 mg/dL (ref 7.0–26.0)
CO2: 20 meq/L — AB (ref 22–29)
Calcium: 8.9 mg/dL (ref 8.4–10.4)
Chloride: 108 mEq/L (ref 98–109)
Creatinine: 0.8 mg/dL (ref 0.7–1.3)
GLUCOSE: 87 mg/dL (ref 70–140)
Potassium: 4 mEq/L (ref 3.5–5.1)
Sodium: 139 mEq/L (ref 136–145)
Total Protein: 6.9 g/dL (ref 6.4–8.3)

## 2015-04-23 MED ORDER — SODIUM CHLORIDE 0.9 % IV SOLN
Freq: Once | INTRAVENOUS | Status: AC
  Administered 2015-04-23: 11:00:00 via INTRAVENOUS

## 2015-04-23 MED ORDER — SODIUM CHLORIDE 0.9 % IV SOLN
200.0000 mg | Freq: Once | INTRAVENOUS | Status: AC
  Administered 2015-04-23: 200 mg via INTRAVENOUS
  Filled 2015-04-23: qty 8

## 2015-04-23 MED ORDER — FAMOTIDINE 20 MG PO TABS
ORAL_TABLET | ORAL | Status: AC
  Filled 2015-04-23: qty 1

## 2015-04-23 MED ORDER — FAMOTIDINE 20 MG PO TABS: 20.0000 mg | ORAL_TABLET | Freq: Once | ORAL | Status: DC

## 2015-04-23 NOTE — Progress Notes (Signed)
Oncology Nurse Navigator Documentation  Oncology Nurse Navigator Flowsheets 04/23/2015  Navigator Location [facility]  Navigator Encounter Type Treatment  Patient Visit Type MedOnc  Treatment Phase Treatment  Barriers/Navigation Needs Education  Education Pain/ Symptom Management;Smoking cessation;Other  Interventions Education Method  Education Method Verbal;Written  Support Groups/Services Brain Tumor Support Group;Other  Acuity Level 2  Acuity Level 2 Educational needs;Other  Time Spent with Patient 30   1. Educational Needs: Yes Understanding Treatment: Minimal education on treatment and side effects  2. Financial Needs: None  3. Nutritional Needs: None  4. Physical Activity Needs: None   5. Smoking Cessation Needs: Yes Resources: 1-800-QUIT-NOW, [employer] 438-202-1027, Smoking Cessation Class information at [facility], Smoking cessation wallet card  6. Support System Needs: Yes Resources: Support group/brain and living with cancer at [facility]  7. Transportation Needs: None

## 2015-04-23 NOTE — Telephone Encounter (Signed)
Gave and printed appt sched and avs for pt for April and May °

## 2015-04-23 NOTE — Patient Instructions (Signed)
[facility] Discharge Instructions for Patients Receiving Chemotherapy  Today you received the following chemotherapy agents:  Keytruda.  To help prevent nausea and vomiting after your treatment, we encourage you to take your nausea medication as prescribed.   If you develop nausea and vomiting that is not controlled by your nausea medication, call the clinic.   BELOW ARE SYMPTOMS THAT SHOULD BE REPORTED IMMEDIATELY:  *FEVER GREATER THAN 100.5 F  *CHILLS WITH OR WITHOUT FEVER  NAUSEA AND VOMITING THAT IS NOT CONTROLLED WITH YOUR NAUSEA MEDICATION  *UNUSUAL SHORTNESS OF BREATH  *UNUSUAL BRUISING OR BLEEDING  TENDERNESS IN MOUTH AND THROAT WITH OR WITHOUT PRESENCE OF ULCERS  *URINARY PROBLEMS  *BOWEL PROBLEMS  UNUSUAL RASH Items with * indicate a potential emergency and should be followed up as soon as possible.  Feel free to call the clinic you have any questions or concerns. The clinic phone number is [phone].  Please show the CHEMO ALERT CARD at check-in to the Emergency Department and triage nurse.   

## 2015-04-23 NOTE — Progress Notes (Signed)
[place] [phone]   [phone]  OFFICE PROGRESS NOTE  FRIED, Timothy Graff, MD [address] [place] [address]  DIAGNOSIS: Stage IV (T2a, N1, M1b) non-small cell lung cancer, adenocarcinoma, negative EGFR mutation but equivocal EGFR amplification, negative ALK gene translocation and negative ROS 1but with PDL-1 expression 100% presented with left lower lobe lung mass in addition to left hilar adenopathy and solitary metastatic brain lesion diagnosed in December 2016.  PRIOR THERAPY:  1) status post stereotactic radiotherapy and surgical resection diagnosed in December 2016. 2) status post video bronchoscopy with left VATS and wedge resection of the left upper lobe and left lower lobe superior segmentectomy with lymph node dissection under the care of Dr. Servando Snare on 01/14/2015.  CURRENT THERAPY: Immunotherapy with Ketruda (pembrolizumab) 200 MG IV every 3 weeks, first dose 02/18/2015. Status post 3 cycles.  INTERVAL HISTORY: Timothy Obrien 55 y.o. male returns to the clinic today for follow-up visit.  The patient tolerated the third cycle of his treatment with immunotherapy with Ketruda (pembrolizumab) much better than the previous 2 cycles with less itching. He has no complaints today. He denied having any diarrhea. He has no nausea or vomiting, no fever or chills. He has no significant chest pain, shortness of breath, cough or hemoptysis.Marland Kitchen He denied having any fever or chills. Unfortunately he continues to smoke. He had repeat CT scan of the chest, abdomen and pelvis performed recently and he is here for evaluation and discussion of his scan results.  MEDICAL HISTORY: Past Medical History  Diagnosis Date  . Lung cancer ([place])     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  . Pneumonia   . Bronchitis   . Tobacco abuse   . Seizures ([place])     last 12/11/14  . Shortness of breath dyspnea     with exertion    ALLERGIES:  has No Known  Allergies.  MEDICATIONS:  Current Outpatient Prescriptions  Medication Sig Dispense Refill  . levETIRAcetam (KEPPRA) 500 MG tablet Take 1 tablet (500 mg total) by mouth every evening. 30 tablet 1  . mirtazapine (REMERON) 30 MG tablet Take 1 tablet (30 mg total) by mouth at bedtime. 30 tablet 2   No current facility-administered medications for this visit.    SURGICAL HISTORY:  Past Surgical History  Procedure Laterality Date  . Hernia repair    . Vasectomy    . Video bronchoscopy Bilateral 12/16/2014    Procedure: VIDEO BRONCHOSCOPY WITH FLUORO;  Surgeon: Collene Gobble, MD;  Location: [facility];  Service: Cardiopulmonary;  Laterality: Bilateral;  . Craniotomy N/A 12/26/2014    Procedure: CRANIOTOMY TUMOR EXCISION with BrainLab;  Surgeon: Kevan Ny Ditty, MD;  Location: [facility];  Service: Neurosurgery;  Laterality: N/A;  CRANIOTOMY TUMOR EXCISION with [facility]  . Application of cranial navigation N/A 12/26/2014    Procedure: APPLICATION OF CRANIAL NAVIGATION;  Surgeon: Kevan Ny Ditty, MD;  Location: [facility];  Service: Neurosurgery;  Laterality: N/A;  . Video bronchoscopy N/A 01/14/2015    Procedure: VIDEO BRONCHOSCOPY;  Surgeon: Grace Isaac, MD;  Location: [facility];  Service: Thoracic;  Laterality: N/A;  . Video assisted thoracoscopy (vats)/wedge resection Left 01/14/2015    Procedure: VIDEO ASSISTED THORACOSCOPY (VATS)/WEDGE RESECTION, Superior segmentectomy left  lower lobe, wedge resection of left upper lobe, multiple lymph node disection, On Q insertion.;  Surgeon: Grace Isaac, MD;  Location: [facility];  Service: Thoracic;  Laterality: Left;    REVIEW OF SYSTEMS:  Constitutional: negative Eyes: negative Ears, nose, mouth, throat, and face: negative Respiratory: negative Cardiovascular: negative Gastrointestinal: negative Genitourinary:negative Integument/breast: negative Hematologic/lymphatic: negative Musculoskeletal:negative Neurological:  negative Behavioral/Psych: negative Endocrine: negative Allergic/Immunologic: negative   PHYSICAL EXAMINATION: General appearance: alert, cooperative, fatigued and no distress Head: Normocephalic, without obvious abnormality, atraumatic Neck: no adenopathy, no JVD, supple, symmetrical, trachea midline and thyroid not enlarged, symmetric, no tenderness/mass/nodules Lymph nodes: Cervical, supraclavicular, and axillary nodes normal. Resp: clear to auscultation bilaterally Back: symmetric, no curvature. ROM normal. No CVA tenderness. Cardio: regular rate and rhythm, S1, S2 normal, no murmur, click, rub or gallop GI: soft, non-tender; bowel sounds normal; no masses,  no organomegaly Extremities: extremities normal, atraumatic, no cyanosis or edema Neurologic: Alert and oriented X 3, normal strength and tone. Normal symmetric reflexes. Normal coordination and gait  ECOG PERFORMANCE STATUS: 1 - Symptomatic but completely ambulatory  Blood pressure 127/96, pulse 62, temperature 97.8 F (36.6 C), temperature source Oral, resp. rate 18, height 6' (1.829 m), weight 160 lb 14.4 oz (72.984 kg), SpO2 100 %.  LABORATORY DATA: Lab Results  Component Value Date   WBC 8.3 04/23/2015   HGB 15.2 04/23/2015   HCT 46.4 04/23/2015   MCV 97.1 04/23/2015   PLT 262 04/23/2015      Chemistry      Component Value Date/Time   NA 138 04/01/2015 0842   NA 138 01/16/2015 0450   K 3.9 04/01/2015 0842   K 4.1 01/16/2015 0450   CL 109 01/16/2015 0450   CO2 21* 04/01/2015 0842   CO2 22 01/16/2015 0450   BUN 15.7 04/01/2015 0842   BUN 6 01/16/2015 0450   CREATININE 0.8 04/01/2015 0842   CREATININE 0.68 01/16/2015 0450      Component Value Date/Time   CALCIUM 9.3 04/01/2015 0842   CALCIUM 8.3* 01/16/2015 0450   ALKPHOS 104 04/01/2015 0842   ALKPHOS 60 01/16/2015 0450   AST 25 04/01/2015 0842   AST 31 01/16/2015 0450   ALT 27 04/01/2015 0842   ALT 26 01/16/2015 0450   BILITOT 0.57 04/01/2015 0842    BILITOT 0.9 01/16/2015 0450       RADIOGRAPHIC STUDIES: Dg Chest 2 View  04/16/2015  CLINICAL DATA:  Post wedge resection and left upper lobe resection and left lower lobe superior segmentectomy for adenocarcinoma, followup EXAM: CHEST  2 VIEW COMPARISON:  Chest x-ray of 02/05/2015 FINDINGS: No active infiltrate or effusion is seen. Mediastinal and hilar contours are unremarkable. Old healed fractures of the left posteriorly seventh and eighth ribs are noted. The heart is within normal limits in size. No acute bony abnormality is seen IMPRESSION: No active lung disease. No evidence of recurrence of lung carcinoma. Electronically Signed   By: Ivar Drape M.D.   On: 04/16/2015 11:56   Ct Chest W Contrast  04/20/2015  CLINICAL DATA:  Metastatic lung cancer.  Ongoing immunotherapy. EXAM: CT CHEST, ABDOMEN, AND PELVIS WITH CONTRAST TECHNIQUE: Multidetector CT imaging of the chest, abdomen and pelvis was performed following the standard protocol during bolus administration of intravenous contrast. CONTRAST:  17m ISOVUE-300 IOPAMIDOL (ISOVUE-300) INJECTION 61% COMPARISON:  CT abdomen pelvis 02/16/2015. PET 12/24/2014 and CT chest 12/13/2014. FINDINGS: CT CHEST FINDINGS Mediastinum/Lymph Nodes: No pathologically enlarged mediastinal, hilar or axillary lymph nodes. Coronary artery calcification. Heart size normal. No pericardial effusion. Lungs/Pleura: Moderate centrilobular emphysema. Spiculated 5 x 5 mm nodule in the apical segment right upper lobe (series 4, image 23), new from 12/13/2014.  Postoperative scarring and volume loss in the left perihilar region. Residual rounded area consolidation in the superior segment left lower lobe measures 1.6 x 1.7 cm (4/62), previously 3.0 x 3.0 cm. Subsegmental volume loss and dependent atelectasis in the lower lobes bilaterally. No pleural fluid. Airway is otherwise unremarkable. Musculoskeletal: No worrisome lytic or sclerotic lesions. Thoracotomy changes on the left. CT  ABDOMEN PELVIS FINDINGS Hepatobiliary: 4 mm low-attenuation lesion in the posterior right hepatic lobe, unchanged. Liver and gallbladder are otherwise unremarkable. No biliary ductal dilatation. Pancreas: Negative. Spleen: Negative. Adrenals/Urinary Tract: Adrenal glands are unremarkable. Low-attenuation lesions in the right kidney measure up to 9 mm, too small to definitively characterize but stable and statistically, likely cysts. Kidneys are otherwise unremarkable. Ureters are decompressed. Bladder is grossly unremarkable. Stomach/Bowel: Stomach, small bowel, appendix and colon are unremarkable. Vascular/Lymphatic: Atherosclerotic calcification of the arterial vasculature without abdominal aortic aneurysm. No pathologically enlarged lymph nodes. Reproductive: Prostate is mildly enlarged. Other: No free fluid.  Mesenteries and peritoneum are unremarkable. Musculoskeletal: No worrisome lytic or sclerotic lesions. Small sclerotic lesions in the left iliac wing and proximal right femur, unchanged. IMPRESSION: 1. Interval decrease in size of nodular consolidation in the left lower lobe. 2. Spiculated right upper lobe nodule is new. Continued attention on followup exams is warranted as a second site of primary malignancy or metastatic disease not excluded. 3. Coronary artery calcification. Electronically Signed   By: Lorin Picket M.D.   On: 04/20/2015 10:35   Mr [name] GD Contrast  04/07/2015  CLINICAL DATA:  Metastatic lung cancer. Left frontal lobe mass resection and stereotactic radiosurgery in 12/2014. EXAM: MRI HEAD WITHOUT AND WITH CONTRAST TECHNIQUE: Multiplanar, multiecho pulse sequences of the brain and surrounding structures were obtained without and with intravenous contrast. CONTRAST:  26m MULTIHANCE GADOBENATE DIMEGLUMINE 529 MG/ML IV SOLN COMPARISON:  12/27/2014 FINDINGS: There is no evidence of acute infarct, midline shift, or extra-axial fluid collection. Pneumocephalus has resolved. Ventricles  and sulci are within normal limits. Slight cerebellar tonsillar ectopia is unchanged. Sequelae of left frontal craniotomy are again identified. The residual resection cavity in the posterior left frontal lobe measures 1.6 x 1.2 cm with chronic blood products present. There is minimal surrounding white matter T2 hyperintensity/edema, decreased from the prior study. There is minimal thin enhancement along the predominantly medial margin of the resection cavity, with the majority of the signal in the cavity reflecting intrinsically T1 hyperintense blood products. Diffuse dural enhancement has decreased from the immediate postoperative study. Mild residual dural thickening and enhancement is greatest at the craniotomy site and is likely postoperative in nature. No masslike parenchymal or meningeal enhancement is identified. No new enhancing brain lesions are identified. The orbits are unremarkable. A right maxillary sinus mucous retention cyst and trace right mastoid fluid are noted. Major intracranial vascular flow voids are preserved. IMPRESSION: Evolving postoperative changes in the left frontal lobe. No evidence of new or recurrent metastasis. Electronically Signed   By: ALogan BoresM.D.   On: 04/07/2015 09:55   Ct Abdomen Pelvis W Contrast  04/20/2015  CLINICAL DATA:  Metastatic lung cancer.  Ongoing immunotherapy. EXAM: CT CHEST, ABDOMEN, AND PELVIS WITH CONTRAST TECHNIQUE: Multidetector CT imaging of the chest, abdomen and pelvis was performed following the standard protocol during bolus administration of intravenous contrast. CONTRAST:  1064mISOVUE-300 IOPAMIDOL (ISOVUE-300) INJECTION 61% COMPARISON:  CT abdomen pelvis 02/16/2015. PET 12/24/2014 and CT chest 12/13/2014. FINDINGS: CT CHEST FINDINGS Mediastinum/Lymph Nodes: No pathologically enlarged mediastinal, hilar or axillary lymph nodes. Coronary artery  calcification. Heart size normal. No pericardial effusion. Lungs/Pleura: Moderate centrilobular  emphysema. Spiculated 5 x 5 mm nodule in the apical segment right upper lobe (series 4, image 23), new from 12/13/2014. Postoperative scarring and volume loss in the left perihilar region. Residual rounded area consolidation in the superior segment left lower lobe measures 1.6 x 1.7 cm (4/62), previously 3.0 x 3.0 cm. Subsegmental volume loss and dependent atelectasis in the lower lobes bilaterally. No pleural fluid. Airway is otherwise unremarkable. Musculoskeletal: No worrisome lytic or sclerotic lesions. Thoracotomy changes on the left. CT ABDOMEN PELVIS FINDINGS Hepatobiliary: 4 mm low-attenuation lesion in the posterior right hepatic lobe, unchanged. Liver and gallbladder are otherwise unremarkable. No biliary ductal dilatation. Pancreas: Negative. Spleen: Negative. Adrenals/Urinary Tract: Adrenal glands are unremarkable. Low-attenuation lesions in the right kidney measure up to 9 mm, too small to definitively characterize but stable and statistically, likely cysts. Kidneys are otherwise unremarkable. Ureters are decompressed. Bladder is grossly unremarkable. Stomach/Bowel: Stomach, small bowel, appendix and colon are unremarkable. Vascular/Lymphatic: Atherosclerotic calcification of the arterial vasculature without abdominal aortic aneurysm. No pathologically enlarged lymph nodes. Reproductive: Prostate is mildly enlarged. Other: No free fluid.  Mesenteries and peritoneum are unremarkable. Musculoskeletal: No worrisome lytic or sclerotic lesions. Small sclerotic lesions in the left iliac wing and proximal right femur, unchanged. IMPRESSION: 1. Interval decrease in size of nodular consolidation in the left lower lobe. 2. Spiculated right upper lobe nodule is new. Continued attention on followup exams is warranted as a second site of primary malignancy or metastatic disease not excluded. 3. Coronary artery calcification. Electronically Signed   By: Lorin Picket M.D.   On: 04/20/2015 10:35    ASSESSMENT AND  PLAN: This is a very pleasant 55 years old white male diagnosed with a stage IV non-small cell lung cancer, T2a, N1, M1b) non-small cell lung cancer presented with solitary brain metastasis status post stereotactic radiotherapy followed by craniotomy and resection of the brain tumor. The patient also underwent wedge resection of the left upper lobe as well as left lower lobe superior segmentectomy with lymph node dissection. Unfortunately the left lower lobe resection margin was positive for adenocarcinoma. His molecular study showed equivocal EGFR amplification, negative ALK gene translocation, negative Ros 1 but the immunohistochemical stains showed positive for PDL 1 expression at 100%. He is currently on treatment with Ketruda (pembrolizumab) status post 3 cycles and tolerated his treatment fairly well. The recent CT scan of the chest, abdomen and pelvis showed interval decrease in the size of the nodular consolidation in the left lower lobe but there was no small spiculated right upper lobe nodule measuring 5 mm, suspicious for new primary malignancy or metastatic disease. I discussed the scan results and showed the images to the patient today. I recommended for him to continue his current treatment with immunotherapy for now and we will monitor the right upper lobe nodule closely on upcoming scan. He will proceed with cycle #4 today as a scheduled. For depression, he will continue on Remeron 30 mg by mouth daily at bedtime. He would come back for follow-up visit in 3 weeks for reevaluation before starting cycle #5. For smoke cessation, I strongly encouraged the patient to quit smoking and offered him to smoke cessation program. Was also seen by the thoracic navigator for smoke cessation counseling today. The patient was advised to call if he has any concerning symptoms in the interval. The patient voices understanding of current disease status and treatment options and is in agreement with the  current  care plan.  All questions were answered. The patient knows to call the clinic with any problems, questions or concerns. We can certainly see the patient much sooner if necessary.  Disclaimer: This note was dictated with voice recognition software. Similar sounding words can inadvertently be transcribed and may not be corrected upon review.

## 2015-05-13 ENCOUNTER — Other Ambulatory Visit: Payer: Self-pay

## 2015-05-13 DIAGNOSIS — C3432 Malignant neoplasm of lower lobe, left bronchus or lung: Secondary | ICD-10-CM

## 2015-05-14 ENCOUNTER — Ambulatory Visit (HOSPITAL_BASED_OUTPATIENT_CLINIC_OR_DEPARTMENT_OTHER): Payer: BLUE CROSS/BLUE SHIELD

## 2015-05-14 ENCOUNTER — Telehealth: Payer: Self-pay | Admitting: *Deleted

## 2015-05-14 ENCOUNTER — Telehealth: Payer: Self-pay | Admitting: Internal Medicine

## 2015-05-14 ENCOUNTER — Encounter: Payer: Self-pay | Admitting: *Deleted

## 2015-05-14 ENCOUNTER — Ambulatory Visit (HOSPITAL_BASED_OUTPATIENT_CLINIC_OR_DEPARTMENT_OTHER): Payer: BLUE CROSS/BLUE SHIELD | Admitting: Internal Medicine

## 2015-05-14 ENCOUNTER — Other Ambulatory Visit (HOSPITAL_BASED_OUTPATIENT_CLINIC_OR_DEPARTMENT_OTHER): Payer: BLUE CROSS/BLUE SHIELD

## 2015-05-14 ENCOUNTER — Encounter: Payer: Self-pay | Admitting: Internal Medicine

## 2015-05-14 VITALS — BP 117/77 | HR 69 | Temp 97.9°F | Resp 18 | Ht 72.0 in | Wt 161.1 lb

## 2015-05-14 DIAGNOSIS — C3432 Malignant neoplasm of lower lobe, left bronchus or lung: Secondary | ICD-10-CM

## 2015-05-14 DIAGNOSIS — Z79899 Other long term (current) drug therapy: Secondary | ICD-10-CM | POA: Diagnosis not present

## 2015-05-14 DIAGNOSIS — C7931 Secondary malignant neoplasm of brain: Secondary | ICD-10-CM

## 2015-05-14 DIAGNOSIS — F329 Major depressive disorder, single episode, unspecified: Secondary | ICD-10-CM

## 2015-05-14 DIAGNOSIS — Z87891 Personal history of nicotine dependence: Secondary | ICD-10-CM | POA: Diagnosis not present

## 2015-05-14 DIAGNOSIS — Z5112 Encounter for antineoplastic immunotherapy: Secondary | ICD-10-CM

## 2015-05-14 LAB — CBC WITH DIFFERENTIAL/PLATELET
BASO%: 0.4 % (ref 0.0–2.0)
BASOS ABS: 0 10*3/uL (ref 0.0–0.1)
EOS ABS: 0.4 10*3/uL (ref 0.0–0.5)
EOS%: 4 % (ref 0.0–7.0)
HCT: 49.4 % (ref 38.4–49.9)
HEMOGLOBIN: 17.2 g/dL — AB (ref 13.0–17.1)
LYMPH#: 3.2 10*3/uL (ref 0.9–3.3)
LYMPH%: 31.6 % (ref 14.0–49.0)
MCH: 33 pg (ref 27.2–33.4)
MCHC: 34.8 g/dL (ref 32.0–36.0)
MCV: 94.6 fL (ref 79.3–98.0)
MONO#: 0.8 10*3/uL (ref 0.1–0.9)
MONO%: 8.3 % (ref 0.0–14.0)
NEUT#: 5.6 10*3/uL (ref 1.5–6.5)
NEUT%: 55.7 % (ref 39.0–75.0)
PLATELETS: 283 10*3/uL (ref 140–400)
RBC: 5.22 10*6/uL (ref 4.20–5.82)
RDW: 13.9 % (ref 11.0–14.6)
WBC: 10.1 10*3/uL (ref 4.0–10.3)

## 2015-05-14 LAB — COMPREHENSIVE METABOLIC PANEL
ALBUMIN: 4.1 g/dL (ref 3.5–5.0)
ALK PHOS: 100 U/L (ref 40–150)
ALT: 18 U/L (ref 0–55)
ANION GAP: 11 meq/L (ref 3–11)
AST: 24 U/L (ref 5–34)
BUN: 10.8 mg/dL (ref 7.0–26.0)
CALCIUM: 9.4 mg/dL (ref 8.4–10.4)
CHLORIDE: 102 meq/L (ref 98–109)
CO2: 22 mEq/L (ref 22–29)
Creatinine: 0.9 mg/dL (ref 0.7–1.3)
Glucose: 85 mg/dl (ref 70–140)
POTASSIUM: 4.3 meq/L (ref 3.5–5.1)
Sodium: 135 mEq/L — ABNORMAL LOW (ref 136–145)
Total Bilirubin: 0.45 mg/dL (ref 0.20–1.20)
Total Protein: 7.6 g/dL (ref 6.4–8.3)

## 2015-05-14 LAB — TSH: TSH: 0.344 m[IU]/L (ref 0.320–4.118)

## 2015-05-14 MED ORDER — FAMOTIDINE 20 MG PO TABS: 20.0000 mg | ORAL_TABLET | Freq: Once | ORAL | Status: DC

## 2015-05-14 MED ORDER — SODIUM CHLORIDE 0.9 % IV SOLN
200.0000 mg | Freq: Once | INTRAVENOUS | Status: AC
  Administered 2015-05-14: 200 mg via INTRAVENOUS
  Filled 2015-05-14: qty 8

## 2015-05-14 MED ORDER — SODIUM CHLORIDE 0.9 % IV SOLN
Freq: Once | INTRAVENOUS | Status: AC
  Administered 2015-05-14: 11:00:00 via INTRAVENOUS

## 2015-05-14 NOTE — Patient Instructions (Signed)
[facility] Discharge Instructions for Patients Receiving Chemotherapy  Today you received the following chemotherapy agents:  Keytruda.  To help prevent nausea and vomiting after your treatment, we encourage you to take your nausea medication as prescribed.   If you develop nausea and vomiting that is not controlled by your nausea medication, call the clinic.   BELOW ARE SYMPTOMS THAT SHOULD BE REPORTED IMMEDIATELY:  *FEVER GREATER THAN 100.5 F  *CHILLS WITH OR WITHOUT FEVER  NAUSEA AND VOMITING THAT IS NOT CONTROLLED WITH YOUR NAUSEA MEDICATION  *UNUSUAL SHORTNESS OF BREATH  *UNUSUAL BRUISING OR BLEEDING  TENDERNESS IN MOUTH AND THROAT WITH OR WITHOUT PRESENCE OF ULCERS  *URINARY PROBLEMS  *BOWEL PROBLEMS  UNUSUAL RASH Items with * indicate a potential emergency and should be followed up as soon as possible.  Feel free to call the clinic you have any questions or concerns. The clinic phone number is [phone].  Please show the CHEMO ALERT CARD at check-in to the Emergency Department and triage nurse.   

## 2015-05-14 NOTE — Telephone Encounter (Signed)
per pof to sch pt appt-gave pt copy of avs °

## 2015-05-14 NOTE — Telephone Encounter (Signed)
Per staff message and POF I have scheduled appts. Advised scheduler of appts. JMW  

## 2015-05-14 NOTE — Progress Notes (Signed)
[facility] [phone]   Fax:([phone]  OFFICE PROGRESS NOTE  FRIED, [name], MD [address] [place] [address]  DIAGNOSIS: Stage IV (T2a, N1, M1b) non-small cell lung cancer, adenocarcinoma, negative EGFR mutation but equivocal EGFR amplification, negative ALK gene translocation and negative ROS 1but with PDL-1 expression 100% presented with left lower lobe lung mass in addition to left hilar adenopathy and solitary metastatic brain lesion diagnosed in December 2016.  PRIOR THERAPY:  1) status post stereotactic radiotherapy and surgical resection diagnosed in December 2016. 2) status post video bronchoscopy with left VATS and wedge resection of the left upper lobe and left lower lobe superior segmentectomy with lymph node dissection under the care of Dr. Servando Snare on 01/14/2015.  CURRENT THERAPY: Immunotherapy with Ketruda (pembrolizumab) 200 MG IV every 3 weeks, first dose 02/18/2015. Status post 4 cycles.  INTERVAL HISTORY: Timothy Obrien 55 y.o. male returns to the clinic today for follow-up visit accompanied by his ex-wife. The patient is doing fine today with no specific complaints. He tolerated the last cycle of his treatment with immunotherapy with [name] (pembrolizumab) there was no significant adverse effects he quit smoking few weeks ago but he continues to drink 4-6 beers every night. He has no complaints today. He denied having any diarrhea. He has no nausea or vomiting, no fever or chills. He has no significant chest pain, shortness of breath, cough or hemoptysis.Marland Kitchen He denied having any fever or chills. He is here today to start cycle #5 of his immunotherapy.  MEDICAL HISTORY: Past Medical History  Diagnosis Date  . Lung cancer ([facility])     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  . Pneumonia   . Bronchitis   . Tobacco abuse   . Seizures ([facility] [facility])     last 12/11/14  . Shortness of breath dyspnea     with exertion     ALLERGIES:  has No Known Allergies.  MEDICATIONS:  Current Outpatient Prescriptions  Medication Sig Dispense Refill  . levETIRAcetam (KEPPRA) 500 MG tablet Take 1 tablet (500 mg total) by mouth every evening. 30 tablet 1  . mirtazapine (REMERON) 30 MG tablet Take 1 tablet (30 mg total) by mouth at bedtime. 30 tablet 2   No current facility-administered medications for this visit.    SURGICAL HISTORY:  Past Surgical History  Procedure Laterality Date  . Hernia repair    . Vasectomy    . Video bronchoscopy Bilateral 12/16/2014    Procedure: VIDEO BRONCHOSCOPY WITH FLUORO;  Surgeon: Collene Gobble, MD;  Location: [facility];  Service: Cardiopulmonary;  Laterality: Bilateral;  . Craniotomy N/A 12/26/2014    Procedure: CRANIOTOMY TUMOR EXCISION with BrainLab;  Surgeon: Kevan Ny Ditty, MD;  Location: [facility];  Service: Neurosurgery;  Laterality: N/A;  CRANIOTOMY TUMOR EXCISION with Stealth  . Application of cranial navigation N/A 12/26/2014    Procedure: APPLICATION OF CRANIAL NAVIGATION;  Surgeon: Kevan Ny Ditty, MD;  Location: [facility];  Service: Neurosurgery;  Laterality: N/A;  . Video bronchoscopy N/A 01/14/2015    Procedure: VIDEO BRONCHOSCOPY;  Surgeon: Grace Isaac, MD;  Location: [facility] OR;  Service: Thoracic;  Laterality: N/A;  . Video assisted thoracoscopy (vats)/wedge resection Left 01/14/2015    Procedure: VIDEO ASSISTED THORACOSCOPY (VATS)/WEDGE RESECTION, Superior segmentectomy left  lower lobe, wedge resection of left upper lobe, multiple lymph node disection, On Q insertion.;  Surgeon: Grace Isaac, MD;  Location: [facility];  Service: Thoracic;  Laterality: Left;    REVIEW OF SYSTEMS:  A comprehensive review of systems was negative.   PHYSICAL EXAMINATION: General appearance: alert, cooperative, fatigued and no distress Head: Normocephalic, without obvious abnormality, atraumatic Neck: no adenopathy, no JVD, supple, symmetrical, trachea midline and  thyroid not enlarged, symmetric, no tenderness/mass/nodules Lymph nodes: Cervical, supraclavicular, and axillary nodes normal. Resp: clear to auscultation bilaterally Back: symmetric, no curvature. ROM normal. No CVA tenderness. Cardio: regular rate and rhythm, S1, S2 normal, no murmur, click, rub or gallop GI: soft, non-tender; bowel sounds normal; no masses,  no organomegaly Extremities: extremities normal, atraumatic, no cyanosis or edema Neurologic: Alert and oriented X 3, normal strength and tone. Normal symmetric reflexes. Normal coordination and gait  ECOG PERFORMANCE STATUS: 1 - Symptomatic but completely ambulatory  Blood pressure 117/77, pulse 69, temperature 97.9 F (36.6 C), temperature source Oral, resp. rate 18, height 6' (1.829 m), weight 161 lb 1.6 oz (73.074 kg), SpO2 100 %.  LABORATORY DATA: Lab Results  Component Value Date   WBC 10.1 05/14/2015   HGB 17.2* 05/14/2015   HCT 49.4 05/14/2015   MCV 94.6 05/14/2015   PLT 283 05/14/2015      Chemistry      Component Value Date/Time   NA 139 04/23/2015 0929   NA 138 01/16/2015 0450   K 4.0 04/23/2015 0929   K 4.1 01/16/2015 0450   CL 109 01/16/2015 0450   CO2 20* 04/23/2015 0929   CO2 22 01/16/2015 0450   BUN 12.7 04/23/2015 0929   BUN 6 01/16/2015 0450   CREATININE 0.8 04/23/2015 0929   CREATININE 0.68 01/16/2015 0450      Component Value Date/Time   CALCIUM 8.9 04/23/2015 0929   CALCIUM 8.3* 01/16/2015 0450   ALKPHOS 89 04/23/2015 0929   ALKPHOS 60 01/16/2015 0450   AST 22 04/23/2015 0929   AST 31 01/16/2015 0450   ALT 15 04/23/2015 0929   ALT 26 01/16/2015 0450   BILITOT 0.31 04/23/2015 0929   BILITOT 0.9 01/16/2015 0450       RADIOGRAPHIC STUDIES: Dg Chest 2 View  04/16/2015  CLINICAL DATA:  Post wedge resection and left upper lobe resection and left lower lobe superior segmentectomy for adenocarcinoma, followup EXAM: CHEST  2 VIEW COMPARISON:  Chest x-ray of 02/05/2015 FINDINGS: No active  infiltrate or effusion is seen. Mediastinal and hilar contours are unremarkable. Old healed fractures of the left posteriorly seventh and eighth ribs are noted. The heart is within normal limits in size. No acute bony abnormality is seen IMPRESSION: No active lung disease. No evidence of recurrence of lung carcinoma. Electronically Signed   By: Ivar Drape M.D.   On: 04/16/2015 11:56   Ct Chest W Contrast  04/20/2015  CLINICAL DATA:  Metastatic lung cancer.  Ongoing immunotherapy. EXAM: CT CHEST, ABDOMEN, AND PELVIS WITH CONTRAST TECHNIQUE: Multidetector CT imaging of the chest, abdomen and pelvis was performed following the standard protocol during bolus administration of intravenous contrast. CONTRAST:  18m ISOVUE-300 IOPAMIDOL (ISOVUE-300) INJECTION 61% COMPARISON:  CT abdomen pelvis 02/16/2015. PET 12/24/2014 and CT chest 12/13/2014. FINDINGS: CT CHEST FINDINGS Mediastinum/Lymph Nodes: No pathologically enlarged mediastinal, hilar or axillary lymph nodes. Coronary artery calcification. Heart size normal. No pericardial effusion. Lungs/Pleura: Moderate centrilobular emphysema. Spiculated 5 x 5 mm nodule in the apical segment right upper lobe (series 4, image 23), new from 12/13/2014. Postoperative scarring and volume loss in the left perihilar region. Residual rounded area consolidation in the superior segment left lower lobe  measures 1.6 x 1.7 cm (4/62), previously 3.0 x 3.0 cm. Subsegmental volume loss and dependent atelectasis in the lower lobes bilaterally. No pleural fluid. Airway is otherwise unremarkable. Musculoskeletal: No worrisome lytic or sclerotic lesions. Thoracotomy changes on the left. CT ABDOMEN PELVIS FINDINGS Hepatobiliary: 4 mm low-attenuation lesion in the posterior right hepatic lobe, unchanged. Liver and gallbladder are otherwise unremarkable. No biliary ductal dilatation. Pancreas: Negative. Spleen: Negative. Adrenals/Urinary Tract: Adrenal glands are unremarkable. Low-attenuation  lesions in the right kidney measure up to 9 mm, too small to definitively characterize but stable and statistically, likely cysts. Kidneys are otherwise unremarkable. Ureters are decompressed. Bladder is grossly unremarkable. Stomach/Bowel: Stomach, small bowel, appendix and colon are unremarkable. Vascular/Lymphatic: Atherosclerotic calcification of the arterial vasculature without abdominal aortic aneurysm. No pathologically enlarged lymph nodes. Reproductive: Prostate is mildly enlarged. Other: No free fluid.  Mesenteries and peritoneum are unremarkable. Musculoskeletal: No worrisome lytic or sclerotic lesions. Small sclerotic lesions in the left iliac wing and proximal right femur, unchanged. IMPRESSION: 1. Interval decrease in size of nodular consolidation in the left lower lobe. 2. Spiculated right upper lobe nodule is new. Continued attention on followup exams is warranted as a second site of primary malignancy or metastatic disease not excluded. 3. Coronary artery calcification. Electronically Signed   By: Lorin Picket M.D.   On: 04/20/2015 10:35   Ct Abdomen Pelvis W Contrast  04/20/2015  CLINICAL DATA:  Metastatic lung cancer.  Ongoing immunotherapy. EXAM: CT CHEST, ABDOMEN, AND PELVIS WITH CONTRAST TECHNIQUE: Multidetector CT imaging of the chest, abdomen and pelvis was performed following the standard protocol during bolus administration of intravenous contrast. CONTRAST:  120m ISOVUE-300 IOPAMIDOL (ISOVUE-300) INJECTION 61% COMPARISON:  CT abdomen pelvis 02/16/2015. PET 12/24/2014 and CT chest 12/13/2014. FINDINGS: CT CHEST FINDINGS Mediastinum/Lymph Nodes: No pathologically enlarged mediastinal, hilar or axillary lymph nodes. Coronary artery calcification. Heart size normal. No pericardial effusion. Lungs/Pleura: Moderate centrilobular emphysema. Spiculated 5 x 5 mm nodule in the apical segment right upper lobe (series 4, image 23), new from 12/13/2014. Postoperative scarring and volume loss in  the left perihilar region. Residual rounded area consolidation in the superior segment left lower lobe measures 1.6 x 1.7 cm (4/62), previously 3.0 x 3.0 cm. Subsegmental volume loss and dependent atelectasis in the lower lobes bilaterally. No pleural fluid. Airway is otherwise unremarkable. Musculoskeletal: No worrisome lytic or sclerotic lesions. Thoracotomy changes on the left. CT ABDOMEN PELVIS FINDINGS Hepatobiliary: 4 mm low-attenuation lesion in the posterior right hepatic lobe, unchanged. Liver and gallbladder are otherwise unremarkable. No biliary ductal dilatation. Pancreas: Negative. Spleen: Negative. Adrenals/Urinary Tract: Adrenal glands are unremarkable. Low-attenuation lesions in the right kidney measure up to 9 mm, too small to definitively characterize but stable and statistically, likely cysts. Kidneys are otherwise unremarkable. Ureters are decompressed. Bladder is grossly unremarkable. Stomach/Bowel: Stomach, small bowel, appendix and colon are unremarkable. Vascular/Lymphatic: Atherosclerotic calcification of the arterial vasculature without abdominal aortic aneurysm. No pathologically enlarged lymph nodes. Reproductive: Prostate is mildly enlarged. Other: No free fluid.  Mesenteries and peritoneum are unremarkable. Musculoskeletal: No worrisome lytic or sclerotic lesions. Small sclerotic lesions in the left iliac wing and proximal right femur, unchanged. IMPRESSION: 1. Interval decrease in size of nodular consolidation in the left lower lobe. 2. Spiculated right upper lobe nodule is new. Continued attention on followup exams is warranted as a second site of primary malignancy or metastatic disease not excluded. 3. Coronary artery calcification. Electronically Signed   By: MLorin PicketM.D.   On: 04/20/2015 10:35  ASSESSMENT AND PLAN: This is a very pleasant 55 years old white male diagnosed with a stage IV non-small cell lung cancer, T2a, N1, M1b) non-small cell lung cancer presented  with solitary brain metastasis status post stereotactic radiotherapy followed by craniotomy and resection of the brain tumor. The patient also underwent wedge resection of the left upper lobe as well as left lower lobe superior segmentectomy with lymph node dissection. Unfortunately the left lower lobe resection margin was positive for adenocarcinoma. His molecular study showed equivocal EGFR amplification, negative ALK gene translocation, negative Ros 1 but the immunohistochemical stains showed positive for PDL 1 expression at 100%. He is currently on treatment with Ketruda (pembrolizumab) status post 4 cycles and tolerated his treatment fairly well. I recommended for the patient to proceed with cycle #5 today as scheduled. For depression, he will continue on Remeron 30 mg by mouth daily at bedtime. He would come back for follow-up visit in 3 weeks for reevaluation before starting cycle #6. The patient and his ex-wife had several questions about the previous scan results and I answered them completely to their satisfaction. I also strongly encouraged the patient to cut down on day number of beers he drinks on daily basis. The patient was advised to call if he has any concerning symptoms in the interval. The patient voices understanding of current disease status and treatment options and is in agreement with the current care plan.  All questions were answered. The patient knows to call the clinic with any problems, questions or concerns. We can certainly see the patient much sooner if necessary.  Disclaimer: This note was dictated with voice recognition software. Similar sounding words can inadvertently be transcribed and may not be corrected upon review.

## 2015-05-14 NOTE — Progress Notes (Signed)
Oncology Nurse Navigator Documentation  Oncology Nurse Navigator Flowsheets 05/14/2015  Navigator Encounter Type Clinic/MDC  Patient Visit Type MedOnc  Treatment Phase Treatment  Barriers/Navigation Needs No barriers at this time  Acuity Level 1  Acuity Level 1 Minimal follow up required  Time Spent with Patient 2   Spoke with patient and his ex-wife today.  Patient and ex-wife state they are doing well without complaints.  No barriers identified at this time.

## 2015-06-04 ENCOUNTER — Ambulatory Visit (HOSPITAL_BASED_OUTPATIENT_CLINIC_OR_DEPARTMENT_OTHER): Payer: BLUE CROSS/BLUE SHIELD

## 2015-06-04 ENCOUNTER — Other Ambulatory Visit (HOSPITAL_BASED_OUTPATIENT_CLINIC_OR_DEPARTMENT_OTHER): Payer: BLUE CROSS/BLUE SHIELD

## 2015-06-04 ENCOUNTER — Telehealth: Payer: Self-pay | Admitting: Internal Medicine

## 2015-06-04 ENCOUNTER — Ambulatory Visit (HOSPITAL_BASED_OUTPATIENT_CLINIC_OR_DEPARTMENT_OTHER): Payer: BLUE CROSS/BLUE SHIELD | Admitting: Internal Medicine

## 2015-06-04 ENCOUNTER — Encounter: Payer: Self-pay | Admitting: Internal Medicine

## 2015-06-04 VITALS — BP 114/78 | HR 91 | Temp 98.4°F | Resp 19 | Ht 72.0 in | Wt 155.0 lb

## 2015-06-04 DIAGNOSIS — C7931 Secondary malignant neoplasm of brain: Secondary | ICD-10-CM

## 2015-06-04 DIAGNOSIS — Z720 Tobacco use: Secondary | ICD-10-CM

## 2015-06-04 DIAGNOSIS — R531 Weakness: Secondary | ICD-10-CM

## 2015-06-04 DIAGNOSIS — C3432 Malignant neoplasm of lower lobe, left bronchus or lung: Secondary | ICD-10-CM

## 2015-06-04 DIAGNOSIS — Z5112 Encounter for antineoplastic immunotherapy: Secondary | ICD-10-CM

## 2015-06-04 DIAGNOSIS — E440 Moderate protein-calorie malnutrition: Secondary | ICD-10-CM

## 2015-06-04 DIAGNOSIS — Z9889 Other specified postprocedural states: Secondary | ICD-10-CM

## 2015-06-04 LAB — COMPREHENSIVE METABOLIC PANEL
ALT: 23 U/L (ref 0–55)
AST: 27 U/L (ref 5–34)
Albumin: 4 g/dL (ref 3.5–5.0)
Alkaline Phosphatase: 99 U/L (ref 40–150)
Anion Gap: 10 mEq/L (ref 3–11)
BILIRUBIN TOTAL: 0.61 mg/dL (ref 0.20–1.20)
BUN: 10.2 mg/dL (ref 7.0–26.0)
CO2: 20 meq/L — AB (ref 22–29)
CREATININE: 0.9 mg/dL (ref 0.7–1.3)
Calcium: 9.4 mg/dL (ref 8.4–10.4)
Chloride: 105 mEq/L (ref 98–109)
GLUCOSE: 109 mg/dL (ref 70–140)
Potassium: 4 mEq/L (ref 3.5–5.1)
SODIUM: 135 meq/L — AB (ref 136–145)
TOTAL PROTEIN: 7.3 g/dL (ref 6.4–8.3)

## 2015-06-04 LAB — CBC WITH DIFFERENTIAL/PLATELET
BASO%: 0.5 % (ref 0.0–2.0)
Basophils Absolute: 0.1 10*3/uL (ref 0.0–0.1)
EOS%: 2.1 % (ref 0.0–7.0)
Eosinophils Absolute: 0.3 10*3/uL (ref 0.0–0.5)
HCT: 53.2 % — ABNORMAL HIGH (ref 38.4–49.9)
HGB: 17.9 g/dL — ABNORMAL HIGH (ref 13.0–17.1)
LYMPH%: 19.4 % (ref 14.0–49.0)
MCH: 32.3 pg (ref 27.2–33.4)
MCHC: 33.6 g/dL (ref 32.0–36.0)
MCV: 96 fL (ref 79.3–98.0)
MONO#: 1.1 10*3/uL — ABNORMAL HIGH (ref 0.1–0.9)
MONO%: 8.4 % (ref 0.0–14.0)
NEUT%: 69.6 % (ref 39.0–75.0)
NEUTROS ABS: 9.2 10*3/uL — AB (ref 1.5–6.5)
Platelets: 249 10*3/uL (ref 140–400)
RBC: 5.54 10*6/uL (ref 4.20–5.82)
RDW: 13.6 % (ref 11.0–14.6)
WBC: 13.2 10*3/uL — AB (ref 4.0–10.3)
lymph#: 2.6 10*3/uL (ref 0.9–3.3)

## 2015-06-04 MED ORDER — SODIUM CHLORIDE 0.9 % IV SOLN
200.0000 mg | Freq: Once | INTRAVENOUS | Status: AC
  Administered 2015-06-04: 200 mg via INTRAVENOUS
  Filled 2015-06-04: qty 8

## 2015-06-04 MED ORDER — SODIUM CHLORIDE 0.9 % IV SOLN
Freq: Once | INTRAVENOUS | Status: AC
  Administered 2015-06-04: 12:00:00 via INTRAVENOUS

## 2015-06-04 MED ORDER — FAMOTIDINE 20 MG PO TABS: 20.0000 mg | ORAL_TABLET | Freq: Once | ORAL | Status: DC

## 2015-06-04 NOTE — Patient Instructions (Signed)
[facility] Discharge Instructions for Patients Receiving Chemotherapy  Today you received the following chemotherapy agents:  Keytruda.  To help prevent nausea and vomiting after your treatment, we encourage you to take your nausea medication as prescribed.   If you develop nausea and vomiting that is not controlled by your nausea medication, call the clinic.   BELOW ARE SYMPTOMS THAT SHOULD BE REPORTED IMMEDIATELY:  *FEVER GREATER THAN 100.5 F  *CHILLS WITH OR WITHOUT FEVER  NAUSEA AND VOMITING THAT IS NOT CONTROLLED WITH YOUR NAUSEA MEDICATION  *UNUSUAL SHORTNESS OF BREATH  *UNUSUAL BRUISING OR BLEEDING  TENDERNESS IN MOUTH AND THROAT WITH OR WITHOUT PRESENCE OF ULCERS  *URINARY PROBLEMS  *BOWEL PROBLEMS  UNUSUAL RASH Items with * indicate a potential emergency and should be followed up as soon as possible.  Feel free to call the clinic you have any questions or concerns. The clinic phone number is [phone].  Please show the CHEMO ALERT CARD at check-in to the Emergency Department and triage nurse.   

## 2015-06-04 NOTE — Telephone Encounter (Signed)
Gave and printed appt sched and avs for pt for June thru Aug °

## 2015-06-04 NOTE — Progress Notes (Signed)
[facility] [phone]   Fax:(336) [phone]  OFFICE PROGRESS NOTE  FRIED, [name], MD [address] [place] [address]  DIAGNOSIS: Stage IV (T2a, N1, M1b) non-small cell lung cancer, adenocarcinoma, negative EGFR mutation but equivocal EGFR amplification, negative ALK gene translocation and negative ROS 1but with PDL-1 expression 100% presented with left lower lobe lung mass in addition to left hilar adenopathy and solitary metastatic brain lesion diagnosed in December 2016.  PRIOR THERAPY:  1) status post stereotactic radiotherapy and surgical resection diagnosed in December 2016. 2) status post video bronchoscopy with left VATS and wedge resection of the left upper lobe and left lower lobe superior segmentectomy with lymph node dissection under the care of Dr. Servando Snare on 01/14/2015.  CURRENT THERAPY: Immunotherapy with Ketruda (pembrolizumab) 200 MG IV every 3 weeks, first dose 02/18/2015. Status post 5 cycles.  INTERVAL HISTORY: Timothy Obrien 55 y.o. male returns to the clinic today for follow-up visit. The patient is doing fine today with no specific complaints. He tolerated the last cycle of his treatment with immunotherapy with Ketruda (pembrolizumab) there with no significant adverse effects except for few episodes of diarrhea for 1-2 days. He thinks it was related to some of the food he ate at that time.He did quit smoking few weeks ago except for few puffs of cigars every now and then. He continues to drink 4-6 beers every night. He denied having any diarrhea. He has no nausea or vomiting, no fever or chills. He has no significant chest pain, shortness of breath, cough or hemoptysis.Marland Kitchen He denied having any fever or chills. He is here today to start cycle #6 of his immunotherapy.  MEDICAL HISTORY: Past Medical History  Diagnosis Date  . Lung cancer ([facility])     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  . Pneumonia   . Bronchitis   .  Tobacco abuse   . Seizures ([facility])     last 12/11/14  . Shortness of breath dyspnea     with exertion    ALLERGIES:  has No Known Allergies.  MEDICATIONS:  Current Outpatient Prescriptions  Medication Sig Dispense Refill  . levETIRAcetam (KEPPRA) 500 MG tablet Take 1 tablet (500 mg total) by mouth every evening. 30 tablet 1  . mirtazapine (REMERON) 30 MG tablet Take 1 tablet (30 mg total) by mouth at bedtime. 30 tablet 2   No current facility-administered medications for this visit.    SURGICAL HISTORY:  Past Surgical History  Procedure Laterality Date  . Hernia repair    . Vasectomy    . Video bronchoscopy Bilateral 12/16/2014    Procedure: VIDEO BRONCHOSCOPY WITH FLUORO;  Surgeon: Collene Gobble, MD;  Location: [facility];  Service: Cardiopulmonary;  Laterality: Bilateral;  . Craniotomy N/A 12/26/2014    Procedure: CRANIOTOMY TUMOR EXCISION with BrainLab;  Surgeon: Kevan Ny Ditty, MD;  Location: [facility];  Service: Neurosurgery;  Laterality: N/A;  CRANIOTOMY TUMOR EXCISION with Stealth  . Application of cranial navigation N/A 12/26/2014    Procedure: APPLICATION OF CRANIAL NAVIGATION;  Surgeon: Kevan Ny Ditty, MD;  Location: [facility];  Service: Neurosurgery;  Laterality: N/A;  . Video bronchoscopy N/A 01/14/2015    Procedure: VIDEO BRONCHOSCOPY;  Surgeon: Grace Isaac, MD;  Location: [facility];  Service: Thoracic;  Laterality: N/A;  . Video assisted thoracoscopy (vats)/wedge resection Left 01/14/2015    Procedure: VIDEO ASSISTED THORACOSCOPY (VATS)/WEDGE RESECTION, Superior segmentectomy left  lower  lobe, wedge resection of left upper lobe, multiple lymph node disection, On Q insertion.;  Surgeon: Grace Isaac, MD;  Location: [facility];  Service: Thoracic;  Laterality: Left;    REVIEW OF SYSTEMS:  A comprehensive review of systems was negative.   PHYSICAL EXAMINATION: General appearance: alert, cooperative, fatigued and no distress Head: Normocephalic, without  obvious abnormality, atraumatic Neck: no adenopathy, no JVD, supple, symmetrical, trachea midline and thyroid not enlarged, symmetric, no tenderness/mass/nodules Lymph nodes: Cervical, supraclavicular, and axillary nodes normal. Resp: clear to auscultation bilaterally Back: symmetric, no curvature. ROM normal. No CVA tenderness. Cardio: regular rate and rhythm, S1, S2 normal, no murmur, click, rub or gallop GI: soft, non-tender; bowel sounds normal; no masses,  no organomegaly Extremities: extremities normal, atraumatic, no cyanosis or edema Neurologic: Alert and oriented X 3, normal strength and tone. Normal symmetric reflexes. Normal coordination and gait  ECOG PERFORMANCE STATUS: 1 - Symptomatic but completely ambulatory  Blood pressure 114/78, pulse 91, temperature 98.4 F (36.9 C), temperature source Oral, resp. rate 19, height 6' (1.829 m), weight 155 lb (70.308 kg), SpO2 100 %.  LABORATORY DATA: Lab Results  Component Value Date   WBC 13.2* 06/04/2015   HGB 17.9* 06/04/2015   HCT 53.2* 06/04/2015   MCV 96.0 06/04/2015   PLT 249 06/04/2015      Chemistry      Component Value Date/Time   NA 135* 05/14/2015 0952   NA 138 01/16/2015 0450   K 4.3 05/14/2015 0952   K 4.1 01/16/2015 0450   CL 109 01/16/2015 0450   CO2 22 05/14/2015 0952   CO2 22 01/16/2015 0450   BUN 10.8 05/14/2015 0952   BUN 6 01/16/2015 0450   CREATININE 0.9 05/14/2015 0952   CREATININE 0.68 01/16/2015 0450      Component Value Date/Time   CALCIUM 9.4 05/14/2015 0952   CALCIUM 8.3* 01/16/2015 0450   ALKPHOS 100 05/14/2015 0952   ALKPHOS 60 01/16/2015 0450   AST 24 05/14/2015 0952   AST 31 01/16/2015 0450   ALT 18 05/14/2015 0952   ALT 26 01/16/2015 0450   BILITOT 0.45 05/14/2015 0952   BILITOT 0.9 01/16/2015 0450       RADIOGRAPHIC STUDIES: No results found.  ASSESSMENT AND PLAN: This is a very pleasant 55 years old white male diagnosed with a stage IV non-small cell lung cancer, T2a, N1,  M1b) non-small cell lung cancer presented with solitary brain metastasis status post stereotactic radiotherapy followed by craniotomy and resection of the brain tumor. The patient also underwent wedge resection of the left upper lobe as well as left lower lobe superior segmentectomy with lymph node dissection. Unfortunately the left lower lobe resection margin was positive for adenocarcinoma. His molecular study showed equivocal EGFR amplification, negative ALK gene translocation, negative Ros 1 but the immunohistochemical stains showed positive for PDL 1 expression at 100%. He is currently on treatment with Ketruda (pembrolizumab) status post 5 cycles and tolerated his treatment fairly well. I recommended for the patient to proceed with cycle #6 today as scheduled. For depression, he will continue on Remeron 30 mg by mouth daily at bedtime. He would come back for follow-up visit in 3 weeks for reevaluation before starting cycle #7 after repeating CT scan of the chest, abdomen and pelvis for restaging of his disease. I also strongly encouraged the patient to cut down on the number of beers he drinks on daily basis. The patient was advised to call if he has any concerning symptoms in  the interval. The patient voices understanding of current disease status and treatment options and is in agreement with the current care plan.  All questions were answered. The patient knows to call the clinic with any problems, questions or concerns. We can certainly see the patient much sooner if necessary.  Disclaimer: This note was dictated with voice recognition software. Similar sounding words can inadvertently be transcribed and may not be corrected upon review.

## 2015-06-08 ENCOUNTER — Other Ambulatory Visit: Payer: Self-pay | Admitting: Internal Medicine

## 2015-06-17 ENCOUNTER — Other Ambulatory Visit: Payer: Self-pay | Admitting: Neurological Surgery

## 2015-06-17 DIAGNOSIS — C7931 Secondary malignant neoplasm of brain: Secondary | ICD-10-CM

## 2015-06-23 ENCOUNTER — Encounter (HOSPITAL_COMMUNITY): Payer: Self-pay

## 2015-06-23 ENCOUNTER — Ambulatory Visit (HOSPITAL_COMMUNITY)
Admission: RE | Admit: 2015-06-23 | Discharge: 2015-06-23 | Disposition: A | Discharge status: Home / Self Care | Payer: BLUE CROSS/BLUE SHIELD | Source: Ambulatory Visit | Attending: Internal Medicine | Admitting: Internal Medicine

## 2015-06-23 DIAGNOSIS — C3412 Malignant neoplasm of upper lobe, left bronchus or lung: Secondary | ICD-10-CM | POA: Diagnosis not present

## 2015-06-23 DIAGNOSIS — C3432 Malignant neoplasm of lower lobe, left bronchus or lung: Secondary | ICD-10-CM

## 2015-06-23 DIAGNOSIS — Z9225 Personal history of immunosupression therapy: Secondary | ICD-10-CM | POA: Diagnosis not present

## 2015-06-23 DIAGNOSIS — R918 Other nonspecific abnormal finding of lung field: Secondary | ICD-10-CM | POA: Diagnosis not present

## 2015-06-23 DIAGNOSIS — Z5112 Encounter for antineoplastic immunotherapy: Secondary | ICD-10-CM

## 2015-06-23 MED ORDER — IOPAMIDOL (ISOVUE-300) INJECTION 61%
100.0000 mL | Freq: Once | INTRAVENOUS | Status: AC | PRN
  Administered 2015-06-23: 100 mL via INTRAVENOUS

## 2015-06-25 ENCOUNTER — Telehealth: Payer: Self-pay | Admitting: Internal Medicine

## 2015-06-25 ENCOUNTER — Ambulatory Visit (HOSPITAL_BASED_OUTPATIENT_CLINIC_OR_DEPARTMENT_OTHER): Payer: BLUE CROSS/BLUE SHIELD

## 2015-06-25 ENCOUNTER — Other Ambulatory Visit (HOSPITAL_BASED_OUTPATIENT_CLINIC_OR_DEPARTMENT_OTHER): Payer: BLUE CROSS/BLUE SHIELD

## 2015-06-25 ENCOUNTER — Ambulatory Visit (HOSPITAL_BASED_OUTPATIENT_CLINIC_OR_DEPARTMENT_OTHER): Payer: BLUE CROSS/BLUE SHIELD | Admitting: Internal Medicine

## 2015-06-25 ENCOUNTER — Encounter: Payer: Self-pay | Admitting: Internal Medicine

## 2015-06-25 VITALS — BP 120/77 | HR 78 | Temp 97.9°F | Resp 18 | Ht 72.0 in | Wt 156.6 lb

## 2015-06-25 DIAGNOSIS — R911 Solitary pulmonary nodule: Secondary | ICD-10-CM

## 2015-06-25 DIAGNOSIS — C7931 Secondary malignant neoplasm of brain: Secondary | ICD-10-CM

## 2015-06-25 DIAGNOSIS — R531 Weakness: Secondary | ICD-10-CM

## 2015-06-25 DIAGNOSIS — Z720 Tobacco use: Secondary | ICD-10-CM

## 2015-06-25 DIAGNOSIS — Z5112 Encounter for antineoplastic immunotherapy: Secondary | ICD-10-CM

## 2015-06-25 DIAGNOSIS — E440 Moderate protein-calorie malnutrition: Secondary | ICD-10-CM

## 2015-06-25 DIAGNOSIS — C3432 Malignant neoplasm of lower lobe, left bronchus or lung: Secondary | ICD-10-CM | POA: Diagnosis not present

## 2015-06-25 DIAGNOSIS — F329 Major depressive disorder, single episode, unspecified: Secondary | ICD-10-CM | POA: Diagnosis not present

## 2015-06-25 DIAGNOSIS — Z9889 Other specified postprocedural states: Secondary | ICD-10-CM

## 2015-06-25 LAB — CBC WITH DIFFERENTIAL/PLATELET
BASO%: 0.4 % (ref 0.0–2.0)
BASOS ABS: 0 10*3/uL (ref 0.0–0.1)
EOS ABS: 0.7 10*3/uL — AB (ref 0.0–0.5)
EOS%: 7 % (ref 0.0–7.0)
HCT: 49.7 % (ref 38.4–49.9)
HGB: 17.3 g/dL — ABNORMAL HIGH (ref 13.0–17.1)
LYMPH%: 25.8 % (ref 14.0–49.0)
MCH: 33.2 pg (ref 27.2–33.4)
MCHC: 34.8 g/dL (ref 32.0–36.0)
MCV: 95.4 fL (ref 79.3–98.0)
MONO#: 1 10*3/uL — ABNORMAL HIGH (ref 0.1–0.9)
MONO%: 9.9 % (ref 0.0–14.0)
NEUT#: 5.7 10*3/uL (ref 1.5–6.5)
NEUT%: 56.9 % (ref 39.0–75.0)
Platelets: 246 10*3/uL (ref 140–400)
RBC: 5.21 10*6/uL (ref 4.20–5.82)
RDW: 13.3 % (ref 11.0–14.6)
WBC: 10.1 10*3/uL (ref 4.0–10.3)
lymph#: 2.6 10*3/uL (ref 0.9–3.3)

## 2015-06-25 LAB — COMPREHENSIVE METABOLIC PANEL
ALBUMIN: 4 g/dL (ref 3.5–5.0)
ALK PHOS: 88 U/L (ref 40–150)
ALT: 19 U/L (ref 0–55)
AST: 21 U/L (ref 5–34)
Anion Gap: 10 mEq/L (ref 3–11)
BUN: 18.9 mg/dL (ref 7.0–26.0)
CO2: 20 mEq/L — ABNORMAL LOW (ref 22–29)
Calcium: 9.4 mg/dL (ref 8.4–10.4)
Chloride: 108 mEq/L (ref 98–109)
Creatinine: 0.9 mg/dL (ref 0.7–1.3)
GLUCOSE: 91 mg/dL (ref 70–140)
POTASSIUM: 4.4 meq/L (ref 3.5–5.1)
SODIUM: 139 meq/L (ref 136–145)
Total Bilirubin: 0.34 mg/dL (ref 0.20–1.20)
Total Protein: 7.3 g/dL (ref 6.4–8.3)

## 2015-06-25 MED ORDER — SODIUM CHLORIDE 0.9 % IV SOLN
200.0000 mg | Freq: Once | INTRAVENOUS | Status: AC
  Administered 2015-06-25: 200 mg via INTRAVENOUS
  Filled 2015-06-25: qty 8

## 2015-06-25 MED ORDER — FAMOTIDINE 20 MG PO TABS: 20.0000 mg | ORAL_TABLET | Freq: Once | ORAL | Status: DC

## 2015-06-25 MED ORDER — SODIUM CHLORIDE 0.9 % IV SOLN
Freq: Once | INTRAVENOUS | Status: AC
  Administered 2015-06-25: 10:00:00 via INTRAVENOUS

## 2015-06-25 MED ORDER — FAMOTIDINE IN NACL 20-0.9 MG/50ML-% IV SOLN
INTRAVENOUS | Status: AC
  Filled 2015-06-25: qty 50

## 2015-06-25 NOTE — Progress Notes (Signed)
[facility] [phone]   [phone]) [phone]  OFFICE PROGRESS NOTE  FRIED, [name], MD [address] [place] [address]  DIAGNOSIS: Stage IV (T2a, N1, M1b) non-small cell lung cancer, adenocarcinoma, negative EGFR mutation but equivocal EGFR amplification, negative ALK gene translocation and negative ROS 1but with PDL-1 expression 100% presented with left lower lobe lung mass in addition to left hilar adenopathy and solitary metastatic brain lesion diagnosed in December 2016.  PRIOR THERAPY:  1) status post stereotactic radiotherapy and surgical resection diagnosed in December 2016. 2) status post video bronchoscopy with left VATS and wedge resection of the left upper lobe and left lower lobe superior segmentectomy with lymph node dissection under the care of Dr. Servando Snare on 01/14/2015.  CURRENT THERAPY: Immunotherapy with Ketruda (pembrolizumab) 200 MG IV every 3 weeks, first dose 02/18/2015. Status post 6 cycles.  INTERVAL HISTORY: Timothy Obrien 55 y.o. male returns to the clinic today for follow-up visit. The patient is doing fine today with no specific complaints. He was a little bit frustrated and upset this morning because he received a call from radiation oncology regarding simulation for radiotherapy. We discussed with radiation oncology and it was called to schedule MRI of the brain. He tolerated the last cycle of his treatment with immunotherapy with Ketruda (pembrolizumab) there with no significant adverse effects. Unfortunately continues to smoke and also he continues to drink 4-6 beers every night. He denied having any skin rash or diarrhea. He has no nausea or vomiting, no fever or chills. He has no significant chest pain, shortness of breath, cough or hemoptysis.Marland Kitchen He denied having any fever or chills. He had repeat CT scan of the chest, abdomen and pelvis performed recently and he is here for evaluation and discussion of his scan  results.  MEDICAL HISTORY: Past Medical History  Diagnosis Date  . Lung cancer ([place])     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  . Pneumonia   . Bronchitis   . Tobacco abuse   . Seizures ([facility])     last 12/11/14  . Shortness of breath dyspnea     with exertion    ALLERGIES:  has No Known Allergies.  MEDICATIONS:  Current Outpatient Prescriptions  Medication Sig Dispense Refill  . levETIRAcetam (KEPPRA) 500 MG tablet TAKE 1 TABLET (500 MG TOTAL) BY MOUTH EVERY EVENING. 30 tablet 1  . mirtazapine (REMERON) 30 MG tablet Take 1 tablet (30 mg total) by mouth at bedtime. 30 tablet 2   No current facility-administered medications for this visit.    SURGICAL HISTORY:  Past Surgical History  Procedure Laterality Date  . Hernia repair    . Vasectomy    . Video bronchoscopy Bilateral 12/16/2014    Procedure: VIDEO BRONCHOSCOPY WITH FLUORO;  Surgeon: Collene Gobble, MD;  Location: [facility];  Service: Cardiopulmonary;  Laterality: Bilateral;  . Craniotomy N/A 12/26/2014    Procedure: CRANIOTOMY TUMOR EXCISION with BrainLab;  Surgeon: Kevan Ny Ditty, MD;  Location: [facility];  Service: Neurosurgery;  Laterality: N/A;  CRANIOTOMY TUMOR EXCISION with [facility]  . Application of cranial navigation N/A 12/26/2014    Procedure: APPLICATION OF CRANIAL NAVIGATION;  Surgeon: Kevan Ny Ditty, MD;  Location: [facility];  Service: Neurosurgery;  Laterality: N/A;  . Video bronchoscopy N/A 01/14/2015    Procedure: VIDEO BRONCHOSCOPY;  Surgeon: Grace Isaac, MD;  Location: [facility];  Service: Thoracic;  Laterality: N/A;  . Video  assisted thoracoscopy (vats)/wedge resection Left 01/14/2015    Procedure: VIDEO ASSISTED THORACOSCOPY (VATS)/WEDGE RESECTION, Superior segmentectomy left  lower lobe, wedge resection of left upper lobe, multiple lymph node disection, On Q insertion.;  Surgeon: Grace Isaac, MD;  Location: [facility];  Service: Thoracic;  Laterality: Left;    REVIEW OF SYSTEMS:   Constitutional: negative Eyes: negative Ears, nose, mouth, throat, and face: negative Respiratory: negative Cardiovascular: negative Gastrointestinal: negative Genitourinary:negative Integument/breast: negative Hematologic/lymphatic: negative Musculoskeletal:negative Neurological: negative Behavioral/Psych: negative Endocrine: negative Allergic/Immunologic: negative   PHYSICAL EXAMINATION: General appearance: alert, cooperative, fatigued and no distress Head: Normocephalic, without obvious abnormality, atraumatic Neck: no adenopathy, no JVD, supple, symmetrical, trachea midline and thyroid not enlarged, symmetric, no tenderness/mass/nodules Lymph nodes: Cervical, supraclavicular, and axillary nodes normal. Resp: clear to auscultation bilaterally Back: symmetric, no curvature. ROM normal. No CVA tenderness. Cardio: regular rate and rhythm, S1, S2 normal, no murmur, click, rub or gallop GI: soft, non-tender; bowel sounds normal; no masses,  no organomegaly Extremities: extremities normal, atraumatic, no cyanosis or edema Neurologic: Alert and oriented X 3, normal strength and tone. Normal symmetric reflexes. Normal coordination and gait  ECOG PERFORMANCE STATUS: 1 - Symptomatic but completely ambulatory  Blood pressure 120/77, pulse 78, temperature 97.9 F (36.6 C), temperature source Oral, resp. rate 18, height 6' (1.829 m), weight 156 lb 9.6 oz (71.033 kg), SpO2 100 %.  LABORATORY DATA: Lab Results  Component Value Date   WBC 10.1 06/25/2015   HGB 17.3* 06/25/2015   HCT 49.7 06/25/2015   MCV 95.4 06/25/2015   PLT 246 06/25/2015      Chemistry      Component Value Date/Time   NA 135* 06/04/2015 1012   NA 138 01/16/2015 0450   K 4.0 06/04/2015 1012   K 4.1 01/16/2015 0450   CL 109 01/16/2015 0450   CO2 20* 06/04/2015 1012   CO2 22 01/16/2015 0450   BUN 10.2 06/04/2015 1012   BUN 6 01/16/2015 0450   CREATININE 0.9 06/04/2015 1012   CREATININE 0.68 01/16/2015 0450       Component Value Date/Time   CALCIUM 9.4 06/04/2015 1012   CALCIUM 8.3* 01/16/2015 0450   ALKPHOS 99 06/04/2015 1012   ALKPHOS 60 01/16/2015 0450   AST 27 06/04/2015 1012   AST 31 01/16/2015 0450   ALT 23 06/04/2015 1012   ALT 26 01/16/2015 0450   BILITOT 0.61 06/04/2015 1012   BILITOT 0.9 01/16/2015 0450       RADIOGRAPHIC STUDIES: Ct Chest W Contrast  06/23/2015  CLINICAL DATA:  Patient with history of metastatic lung cancer diagnosed 12/2014 status post partial left lung resection. On immunotherapy. EXAM: CT CHEST, ABDOMEN, AND PELVIS WITH CONTRAST TECHNIQUE: Multidetector CT imaging of the chest, abdomen and pelvis was performed following the standard protocol during bolus administration of intravenous contrast. CONTRAST:  144m ISOVUE-300 IOPAMIDOL (ISOVUE-300) INJECTION 61% COMPARISON:  CT CAP 04/20/2015. FINDINGS: CT CHEST FINDINGS Mediastinum/Lymph Nodes: No enlarged axillary, mediastinal or hilar lymphadenopathy. Normal heart size. No pericardial effusion. Aorta and main pulmonary artery normal in caliber. Coronary arterial vascular calcifications. Lungs/Pleura: Central airways are patent. Probable dependent mucus within the right mainstem bronchus. Re- demonstrated centrilobular emphysematous change. Grossly unchanged 8 x 4 mm irregular nodule within the right upper lobe (image 33; series 4), measuring the same on prior examination when measured in the same plane. Re- demonstrated volume loss and scarring within the left perihilar region. Rounded area of consolidation within the superior segment left lower lobe is decreased in  size measuring 1.4 x 1.3 cm, previously 1.6 x 1.7 cm. No new or enlarging pulmonary nodules are identified. No pleural effusion or pneumothorax. Musculoskeletal: All no aggressive or acute appearing osseous lesions. Old left 7 rib fracture with callus formation. CT ABDOMEN PELVIS FINDINGS Hepatobiliary: The liver is normal in size and contour. Unchanged 4 mm  low-attenuation lesion posterior right hepatic lobe (image 71; series 2). No new or enlarging hepatic lesions are identified. Gallbladder is decompressed. Pancreas: Unremarkable Spleen: Unremarkable Adrenals/Urinary Tract: The adrenal glands are normal. Kidneys enhance symmetrically with contrast. Unchanged sub cm low-attenuation lesion superior pole right kidney, too small to characterize however statistically represents a small cyst. Urinary bladder is unremarkable. Stomach/Bowel: Sigmoid colonic diverticulosis. No CT evidence for acute diverticulitis. Oral contrast material demonstrated throughout the small and large bowel. No abnormal bowel wall thickening or evidence for bowel obstruction. Normal morphology of the stomach. Vascular/Lymphatic: Normal caliber abdominal aorta. Peripheral calcified atherosclerotic plaque. No retroperitoneal lymphadenopathy. Other: None. Musculoskeletal: No aggressive or acute appearing osseous lesions. Lumbar spine degenerative changes. IMPRESSION: Interval decrease in size of nodular consolidation within the left lower lobe. Grossly unchanged irregular right upper lobe pulmonary nodule which is nonspecific in etiology and may represent a second site of primary malignancy or metastatic disease. Recommend continued attention on followup. Electronically Signed   By: Lovey Newcomer M.D.   On: 06/23/2015 09:41   Ct Abdomen Pelvis W Contrast  06/23/2015  CLINICAL DATA:  Patient with history of metastatic lung cancer diagnosed 12/2014 status post partial left lung resection. On immunotherapy. EXAM: CT CHEST, ABDOMEN, AND PELVIS WITH CONTRAST TECHNIQUE: Multidetector CT imaging of the chest, abdomen and pelvis was performed following the standard protocol during bolus administration of intravenous contrast. CONTRAST:  176m ISOVUE-300 IOPAMIDOL (ISOVUE-300) INJECTION 61% COMPARISON:  CT CAP 04/20/2015. FINDINGS: CT CHEST FINDINGS Mediastinum/Lymph Nodes: No enlarged axillary, mediastinal  or hilar lymphadenopathy. Normal heart size. No pericardial effusion. Aorta and main pulmonary artery normal in caliber. Coronary arterial vascular calcifications. Lungs/Pleura: Central airways are patent. Probable dependent mucus within the right mainstem bronchus. Re- demonstrated centrilobular emphysematous change. Grossly unchanged 8 x 4 mm irregular nodule within the right upper lobe (image 33; series 4), measuring the same on prior examination when measured in the same plane. Re- demonstrated volume loss and scarring within the left perihilar region. Rounded area of consolidation within the superior segment left lower lobe is decreased in size measuring 1.4 x 1.3 cm, previously 1.6 x 1.7 cm. No new or enlarging pulmonary nodules are identified. No pleural effusion or pneumothorax. Musculoskeletal: All no aggressive or acute appearing osseous lesions. Old left 7 rib fracture with callus formation. CT ABDOMEN PELVIS FINDINGS Hepatobiliary: The liver is normal in size and contour. Unchanged 4 mm low-attenuation lesion posterior right hepatic lobe (image 71; series 2). No new or enlarging hepatic lesions are identified. Gallbladder is decompressed. Pancreas: Unremarkable Spleen: Unremarkable Adrenals/Urinary Tract: The adrenal glands are normal. Kidneys enhance symmetrically with contrast. Unchanged sub cm low-attenuation lesion superior pole right kidney, too small to characterize however statistically represents a small cyst. Urinary bladder is unremarkable. Stomach/Bowel: Sigmoid colonic diverticulosis. No CT evidence for acute diverticulitis. Oral contrast material demonstrated throughout the small and large bowel. No abnormal bowel wall thickening or evidence for bowel obstruction. Normal morphology of the stomach. Vascular/Lymphatic: Normal caliber abdominal aorta. Peripheral calcified atherosclerotic plaque. No retroperitoneal lymphadenopathy. Other: None. Musculoskeletal: No aggressive or acute appearing  osseous lesions. Lumbar spine degenerative changes. IMPRESSION: Interval decrease in size of nodular consolidation  within the left lower lobe. Grossly unchanged irregular right upper lobe pulmonary nodule which is nonspecific in etiology and may represent a second site of primary malignancy or metastatic disease. Recommend continued attention on followup. Electronically Signed   By: Lovey Newcomer M.D.   On: 06/23/2015 09:41    ASSESSMENT AND PLAN: This is a very pleasant 54 years old white male diagnosed with a stage IV non-small cell lung cancer, T2a, N1, M1b) non-small cell lung cancer presented with solitary brain metastasis status post stereotactic radiotherapy followed by craniotomy and resection of the brain tumor. The patient also underwent wedge resection of the left upper lobe as well as left lower lobe superior segmentectomy with lymph node dissection. Unfortunately the left lower lobe resection margin was positive for adenocarcinoma. His molecular study showed equivocal EGFR amplification, negative ALK gene translocation, negative Ros 1 but the immunohistochemical stains showed positive for PDL 1 expression at 100%. He is currently on treatment with Ketruda (pembrolizumab) status post 6 cycles and tolerated his treatment fairly well. The recent CT scan of the chest, abdomen and pelvis showed further decrease in the size of the left lower lobe nodular consolidation and unchanged right upper lobe pulmonary nodule. I recommended for the patient to continue his current immunotherapy with [place] (pembrolizumab) and he will proceed with cycle #7 today as scheduled. For depression, he will continue on Remeron 30 mg by mouth daily at bedtime. He would come back for follow-up visit in 3 weeks for reevaluation before starting cycle #8. For smoke cessation and alcohol abuse, I strongly encouraged the patient to cut down on the number of beers and cigarette on daily basis. The patient was advised to call if  he has any concerning symptoms in the interval. The patient voices understanding of current disease status and treatment options and is in agreement with the current care plan.  All questions were answered. The patient knows to call the clinic with any problems, questions or concerns. We can certainly see the patient much sooner if necessary.  Disclaimer: This note was dictated with voice recognition software. Similar sounding words can inadvertently be transcribed and may not be corrected upon review.

## 2015-06-25 NOTE — Patient Instructions (Signed)
Smoking Cessation, Tips for Success If you are ready to quit smoking, congratulations! You have chosen to help yourself be healthier. Cigarettes bring nicotine, tar, carbon monoxide, and other irritants into your body. Your lungs, heart, and blood vessels will be able to work better without these poisons. There are many different ways to quit smoking. Nicotine gum, nicotine patches, a nicotine inhaler, or nicotine nasal spray can help with physical craving. Hypnosis, support groups, and medicines help break the habit of smoking. WHAT THINGS CAN I DO TO MAKE QUITTING EASIER?  Here are some tips to help you quit for good:  Pick a date when you will quit smoking completely. Tell all of your friends and family about your plan to quit on that date.  Do not try to slowly cut down on the number of cigarettes you are smoking. Pick a quit date and quit smoking completely starting on that day.  Throw away all cigarettes.   Clean and remove all ashtrays from your home, work, and car.  On a card, write down your reasons for quitting. Carry the card with you and read it when you get the urge to smoke.  Cleanse your body of nicotine. Drink enough water and fluids to keep your urine clear or pale yellow. Do this after quitting to flush the nicotine from your body.  Learn to predict your moods. Do not let a bad situation be your excuse to have a cigarette. Some situations in your life might tempt you into wanting a cigarette.  Never have "just one" cigarette. It leads to wanting another and another. Remind yourself of your decision to quit.  Change habits associated with smoking. If you smoked while driving or when feeling stressed, try other activities to replace smoking. Stand up when drinking your coffee. Brush your teeth after eating. Sit in a different chair when you read the paper. Avoid alcohol while trying to quit, and try to drink fewer caffeinated beverages. Alcohol and caffeine may urge you to  smoke.  Avoid foods and drinks that can trigger a desire to smoke, such as sugary or spicy foods and alcohol.  Ask people who smoke not to smoke around you.  Have something planned to do right after eating or having a cup of coffee. For example, plan to take a walk or exercise.  Try a relaxation exercise to calm you down and decrease your stress. Remember, you may be tense and nervous for the first 2 weeks after you quit, but this will pass.  Find new activities to keep your hands busy. Play with a pen, coin, or rubber band. Doodle or draw things on paper.  Brush your teeth right after eating. This will help cut down on the craving for the taste of tobacco after meals. You can also try mouthwash.   Use oral substitutes in place of cigarettes. Try using lemon drops, carrots, cinnamon sticks, or chewing gum. Keep them handy so they are available when you have the urge to smoke.  When you have the urge to smoke, try deep breathing.  Designate your home as a nonsmoking area.  If you are a heavy smoker, ask your health care provider about a prescription for nicotine chewing gum. It can ease your withdrawal from nicotine.  Reward yourself. Set aside the cigarette money you save and buy yourself something nice.  Look for support from others. Join a support group or smoking cessation program. Ask someone at home or at work to help you with your plan   to quit smoking.  Always ask yourself, "Do I need this cigarette or is this just a reflex?" Tell yourself, "Today, I choose not to smoke," or "I do not want to smoke." You are reminding yourself of your decision to quit.  Do not replace cigarette smoking with electronic cigarettes (commonly called e-cigarettes). The safety of e-cigarettes is unknown, and some may contain harmful chemicals.  If you relapse, do not give up! Plan ahead and think about what you will do the next time you get the urge to smoke. HOW WILL I FEEL WHEN I QUIT SMOKING? You  may have symptoms of withdrawal because your body is used to nicotine (the addictive substance in cigarettes). You may crave cigarettes, be irritable, feel very hungry, cough often, get headaches, or have difficulty concentrating. The withdrawal symptoms are only temporary. They are strongest when you first quit but will go away within 10-14 days. When withdrawal symptoms occur, stay in control. Think about your reasons for quitting. Remind yourself that these are signs that your body is healing and getting used to being without cigarettes. Remember that withdrawal symptoms are easier to treat than the major diseases that smoking can cause.  Even after the withdrawal is over, expect periodic urges to smoke. However, these cravings are generally short lived and will go away whether you smoke or not. Do not smoke! WHAT RESOURCES ARE AVAILABLE TO HELP ME QUIT SMOKING? Your health care provider can direct you to community resources or hospitals for support, which may include:  Group support.  Education.  Hypnosis.  Therapy.   This information is not intended to replace advice given to you by your health care provider. Make sure you discuss any questions you have with your health care provider.   Document Released: 09/25/2003 Document Revised: 01/17/2014 Document Reviewed: 06/14/2012 Elsevier Interactive Patient Education 2016 [employer]  

## 2015-06-25 NOTE — Patient Instructions (Signed)
[facility] Discharge Instructions for Patients Receiving Chemotherapy  Today you received the following chemotherapy agents keytruda   To help prevent nausea and vomiting after your treatment, we encourage you to take your nausea medication as directed  If you develop nausea and vomiting that is not controlled by your nausea medication, call the clinic.   BELOW ARE SYMPTOMS THAT SHOULD BE REPORTED IMMEDIATELY:  *FEVER GREATER THAN 100.5 F  *CHILLS WITH OR WITHOUT FEVER  NAUSEA AND VOMITING THAT IS NOT CONTROLLED WITH YOUR NAUSEA MEDICATION  *UNUSUAL SHORTNESS OF BREATH  *UNUSUAL BRUISING OR BLEEDING  TENDERNESS IN MOUTH AND THROAT WITH OR WITHOUT PRESENCE OF ULCERS  *URINARY PROBLEMS  *BOWEL PROBLEMS  UNUSUAL RASH Items with * indicate a potential emergency and should be followed up as soon as possible.  Feel free to call the clinic you have any questions or concerns. The clinic phone number is [phone].  

## 2015-06-25 NOTE — Telephone Encounter (Signed)
per pof to sch pt appt-gave pt copy of avs-appts already scheduled

## 2015-06-26 ENCOUNTER — Other Ambulatory Visit: Payer: Self-pay | Admitting: Internal Medicine

## 2015-07-07 ENCOUNTER — Ambulatory Visit
Admission: RE | Admit: 2015-07-07 | Discharge: 2015-07-07 | Disposition: A | Discharge status: Home / Self Care | Payer: BLUE CROSS/BLUE SHIELD | Source: Ambulatory Visit | Attending: Neurological Surgery | Admitting: Neurological Surgery

## 2015-07-07 DIAGNOSIS — C7931 Secondary malignant neoplasm of brain: Secondary | ICD-10-CM

## 2015-07-07 MED ORDER — GADOBENATE DIMEGLUMINE 529 MG/ML IV SOLN: 15.0000 mL | Freq: Once | INTRAVENOUS | Status: DC | PRN

## 2015-07-09 ENCOUNTER — Ambulatory Visit
Admission: RE | Admit: 2015-07-09 | Discharge: 2015-07-09 | Disposition: A | Discharge status: Home / Self Care | Payer: BLUE CROSS/BLUE SHIELD | Source: Ambulatory Visit | Attending: Radiation Oncology | Admitting: Radiation Oncology

## 2015-07-09 ENCOUNTER — Encounter: Payer: Self-pay | Admitting: Radiation Oncology

## 2015-07-09 VITALS — BP 113/81 | HR 77 | Temp 97.7°F | Resp 18 | Ht <= 58 in | Wt 156.0 lb

## 2015-07-09 DIAGNOSIS — C7931 Secondary malignant neoplasm of brain: Secondary | ICD-10-CM | POA: Insufficient documentation

## 2015-07-09 DIAGNOSIS — Y842 Radiological procedure and radiotherapy as the cause of abnormal reaction of the patient, or of later complication, without mention of misadventure at the time of the procedure: Secondary | ICD-10-CM | POA: Insufficient documentation

## 2015-07-09 DIAGNOSIS — C3432 Malignant neoplasm of lower lobe, left bronchus or lung: Secondary | ICD-10-CM | POA: Diagnosis not present

## 2015-07-09 DIAGNOSIS — Z79899 Other long term (current) drug therapy: Secondary | ICD-10-CM | POA: Insufficient documentation

## 2015-07-09 NOTE — Progress Notes (Signed)
Radiation Oncology         [phone] ________________________________  Name: Timothy Obrien MRN: [id number]  Date: 07/09/2015  DOB: 1960/07/02  Follow-Up Visit Note  CC: Lenora Boys, MD  Ditty, Loura Halt, *  Diagnosis:   55 y.o. 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 Metastasis to brain (HCC) 198.3 C79.31     Interval Since Last Radiation:  6  months 12/25/2014  Narrative:  Routine follow up after MRI scan of the brain. No cognitive changes,frequent headaches,nausea vomiting, auditory changes,fine motor skills or speech changes. Receiving Keytruda every 3 weeks due again 07-16-15 will get labs and see Dr. Shirline Frees.   Appetite good, skin normal to face and hairline did not use any products during treatment. Was out in the sun yesterday.   .   ALLERGIES:  has No Known Allergies.  Meds: Current Outpatient Prescriptions  Medication Sig Dispense Refill  . levETIRAcetam (KEPPRA) 500 MG tablet TAKE 1 TABLET (500 MG TOTAL) BY MOUTH EVERY EVENING. 30 tablet 1  . mirtazapine (REMERON) 30 MG tablet TAKE 1 TABLET (30 MG TOTAL) BY MOUTH AT BEDTIME. 30 tablet 2   No current facility-administered medications for this encounter.    Physical Findings: The patient is in no acute distress. Patient is alert and oriented.  height is 6" (0.152 m) and weight is 156 lb (70.761 kg). His oral temperature is 97.7 F (36.5 C). His blood pressure is 113/81 and his pulse is 77. His respiration is 18 and oxygen saturation is 98%. .  No significant changes.  Lab Findings: Lab Results  Component Value Date   WBC 10.1 06/25/2015   WBC 8.8 01/16/2015   HGB 17.3* 06/25/2015   HGB 13.3 01/16/2015   HCT 49.7 06/25/2015   HCT 39.2 01/16/2015   PLT 246 06/25/2015   PLT 214 01/16/2015    Lab Results  Component Value Date   NA 139 06/25/2015   NA 138 01/16/2015   K 4.4 06/25/2015   K 4.1 01/16/2015   CHLORIDE 108 06/25/2015   CO2 20* 06/25/2015   CO2 22 01/16/2015   GLUCOSE 91 06/25/2015   GLUCOSE 99 01/16/2015   BUN 18.9 06/25/2015   BUN 6 01/16/2015   CREATININE 0.9 06/25/2015   CREATININE 0.68 01/16/2015   BILITOT 0.34 06/25/2015   BILITOT 0.9 01/16/2015   ALKPHOS 88 06/25/2015   ALKPHOS 60 01/16/2015   AST 21 06/25/2015   AST 31 01/16/2015   ALT 19 06/25/2015   ALT 26 01/16/2015   PROT 7.3 06/25/2015   PROT 5.6* 01/16/2015   ALBUMIN 4.0 06/25/2015   ALBUMIN 2.7* 01/16/2015   CALCIUM 9.4 06/25/2015   CALCIUM 8.3* 01/16/2015   ANIONGAP 10 06/25/2015   ANIONGAP 7 01/16/2015    Radiographic Findings: Ct Chest W Contrast  06/23/2015  CLINICAL DATA:  Patient with history of metastatic lung cancer diagnosed 12/2014 status post partial left lung resection. On immunotherapy. EXAM: CT CHEST, ABDOMEN, AND PELVIS WITH CONTRAST TECHNIQUE: Multidetector CT imaging of the chest, abdomen and pelvis was performed following the standard protocol during bolus administration of intravenous contrast. CONTRAST:  ISOVUE-300 IOPAMIDOL (ISOVUE-300) INJECTION 61% COMPARISON:  CT CAP 04/20/2015. FINDINGS: CT CHEST FINDINGS Mediastinum/Lymph Nodes: No enlarged axillary, mediastinal or hilar lymphadenopathy. Normal heart size. No pericardial effusion. Aorta and main pulmonary artery normal  in caliber. Coronary arterial vascular calcifications. Lungs/Pleura: Central airways are patent. Probable dependent mucus within the right mainstem bronchus. Re- demonstrated centrilobular emphysematous change. Grossly unchanged 8 x 4 mm irregular nodule within the right upper lobe (image 33; series 4), measuring the same on prior examination when measured in the same plane. Re- demonstrated volume loss and scarring within the left perihilar region. Rounded area of consolidation within the superior segment left lower lobe is decreased in size measuring 1.4  x 1.3 cm, previously 1.6 x 1.7 cm. No new or enlarging pulmonary nodules are identified. No pleural effusion or pneumothorax. Musculoskeletal: All no aggressive or acute appearing osseous lesions. Old left 7 rib fracture with callus formation. CT ABDOMEN PELVIS FINDINGS Hepatobiliary: The liver is normal in size and contour. Unchanged 4 mm low-attenuation lesion posterior right hepatic lobe (image 71; series 2). No new or enlarging hepatic lesions are identified. Gallbladder is decompressed. Pancreas: Unremarkable Spleen: Unremarkable Adrenals/Urinary Tract: The adrenal glands are normal. Kidneys enhance symmetrically with contrast. Unchanged sub cm low-attenuation lesion superior pole right kidney, too small to characterize however statistically represents a small cyst. Urinary bladder is unremarkable. Stomach/Bowel: Sigmoid colonic diverticulosis. No CT evidence for acute diverticulitis. Oral contrast material demonstrated throughout the small and large bowel. No abnormal bowel wall thickening or evidence for bowel obstruction. Normal morphology of the stomach. Vascular/Lymphatic: Normal caliber abdominal aorta. Peripheral calcified atherosclerotic plaque. No retroperitoneal lymphadenopathy. Other: None. Musculoskeletal: No aggressive or acute appearing osseous lesions. Lumbar spine degenerative changes. IMPRESSION: Interval decrease in size of nodular consolidation within the left lower lobe. Grossly unchanged irregular right upper lobe pulmonary nodule which is nonspecific in etiology and may represent a second site of primary malignancy or metastatic disease. Recommend continued attention on followup. Electronically Signed   By: Lovey Newcomer M.D.   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 Continued surveillance. Previous surgery and SRS. EXAM: MRI HEAD WITHOUT AND WITH CONTRAST TECHNIQUE: Multiplanar, multiecho pulse sequences of  the brain and surrounding structures were obtained without and with intravenous contrast. CONTRAST:  MultiHance 15 mL. COMPARISON:  04/07/2015. FINDINGS: Continued involution of the LEFT posterior frontal metastasis. Residual cavity now measures 15 x 11 x 17 mm, smaller. Chronic blood products represent postsurgical change. No significant enhancement when precontrast T1 shortening is accounted for. Mild surrounding T2 and FLAIR hyperintensity is also diminished, probably at baseline, representing gliosis. Post infusion, no new lesions are seen. Normal for age cerebral volume. No significant white matter disease aside from that described above. Flow voids are maintained. Extracranial soft tissues unremarkable. IMPRESSION: Continued involution LEFT posterior frontal metastasis. No new lesions are seen. Electronically Signed   By: Staci Righter M.D.   On: 07/07/2015 15:03   Ct Abdomen Pelvis W Contrast  06/23/2015  CLINICAL DATA:  Patient with history of metastatic lung cancer diagnosed 12/2014 status post partial left lung resection. On immunotherapy. EXAM: CT CHEST, ABDOMEN, AND PELVIS WITH CONTRAST TECHNIQUE: Multidetector CT imaging of the chest, abdomen and pelvis was performed following the standard protocol during bolus administration of intravenous contrast. CONTRAST:  11m ISOVUE-300 IOPAMIDOL (ISOVUE-300) INJECTION 61% COMPARISON:  CT CAP 04/20/2015. FINDINGS: CT CHEST FINDINGS Mediastinum/Lymph Nodes: No enlarged axillary, mediastinal or hilar lymphadenopathy. Normal heart size. No pericardial effusion. Aorta and main pulmonary artery normal in caliber. Coronary arterial vascular calcifications. Lungs/Pleura: Central airways are patent. Probable dependent mucus within the right mainstem bronchus. Re- demonstrated  centrilobular emphysematous change. Grossly unchanged 8 x 4 mm irregular nodule within the right upper lobe (image 33; series 4), measuring the same on prior examination when measured in the  same plane. Re- demonstrated volume loss and scarring within the left perihilar region. Rounded area of consolidation within the superior segment left lower lobe is decreased in size measuring 1.4 x 1.3 cm, previously 1.6 x 1.7 cm. No new or enlarging pulmonary nodules are identified. No pleural effusion or pneumothorax. Musculoskeletal: All no aggressive or acute appearing osseous lesions. Old left 7 rib fracture with callus formation. CT ABDOMEN PELVIS FINDINGS Hepatobiliary: The liver is normal in size and contour. Unchanged 4 mm low-attenuation lesion posterior right hepatic lobe (image 71; series 2). No new or enlarging hepatic lesions are identified. Gallbladder is decompressed. Pancreas: Unremarkable Spleen: Unremarkable Adrenals/Urinary Tract: The adrenal glands are normal. Kidneys enhance symmetrically with contrast. Unchanged sub cm low-attenuation lesion superior pole right kidney, too small to characterize however statistically represents a small cyst. Urinary bladder is unremarkable. Stomach/Bowel: Sigmoid colonic diverticulosis. No CT evidence for acute diverticulitis. Oral contrast material demonstrated throughout the small and large bowel. No abnormal bowel wall thickening or evidence for bowel obstruction. Normal morphology of the stomach. Vascular/Lymphatic: Normal caliber abdominal aorta. Peripheral calcified atherosclerotic plaque. No retroperitoneal lymphadenopathy. Other: None. Musculoskeletal: No aggressive or acute appearing osseous lesions. Lumbar spine degenerative changes. IMPRESSION: Interval decrease in size of nodular consolidation within the left lower lobe. Grossly unchanged irregular right upper lobe pulmonary nodule which is nonspecific in etiology and may represent a second site of primary malignancy or metastatic disease. Recommend continued attention on followup. Electronically Signed   By: Lovey Newcomer M.D.   On: 06/23/2015 09:41    Impression:  The patient is recovering from  the effects of radiation.  MRI shows no recurrence of disease.   Plan:  Will continue appropriate follow up MRI scans every 3-4 months.   _____________________________________  Sheral Apley. Tammi Klippel, M.D.   This document serves as a record of services personally performed by Tyler Pita, MD. It was created on his behalf by Truddie Hidden, a trained medical scribe. The creation of this record is based on the scribe's personal observations and the provider's statements to them. This document has been checked and approved by the attending provider.

## 2015-07-09 NOTE — Addendum Note (Signed)
Encounter addended by: Heywood Footman, RN on: 07/09/2015  1:43 PM<BR>     Documentation filed: Charges VN

## 2015-07-09 NOTE — Progress Notes (Signed)
Timothy Obrien is here for review of MRI of brain.  No cognitive changes,frequent headaches,nausea vomiting, auditory changes,fine motor skills or speech changes.  Receiving Keytruda every 3 weeks due again 07-16-15 will get labs and see Dr. Earlie Server. Appetite good, skin normal to face and hairline did not use any products during treatment.  Was out in the sun yesterday. BP 113/81 mmHg  Pulse 77  Temp(Src) 97.7 F (36.5 C) (Oral)  Resp 18  Ht 6" (0.152 m)  Wt 156 lb (70.761 kg)  BMI 3062.72 kg/m2  SpO2 98%

## 2015-07-16 ENCOUNTER — Ambulatory Visit (HOSPITAL_BASED_OUTPATIENT_CLINIC_OR_DEPARTMENT_OTHER): Payer: BLUE CROSS/BLUE SHIELD | Admitting: Internal Medicine

## 2015-07-16 ENCOUNTER — Encounter: Payer: Self-pay | Admitting: Internal Medicine

## 2015-07-16 ENCOUNTER — Telehealth: Payer: Self-pay | Admitting: Internal Medicine

## 2015-07-16 ENCOUNTER — Other Ambulatory Visit (HOSPITAL_BASED_OUTPATIENT_CLINIC_OR_DEPARTMENT_OTHER): Payer: BLUE CROSS/BLUE SHIELD

## 2015-07-16 ENCOUNTER — Ambulatory Visit (HOSPITAL_BASED_OUTPATIENT_CLINIC_OR_DEPARTMENT_OTHER): Payer: BLUE CROSS/BLUE SHIELD

## 2015-07-16 VITALS — BP 136/82 | HR 71 | Temp 97.7°F | Resp 18 | Ht <= 58 in | Wt 164.0 lb

## 2015-07-16 DIAGNOSIS — C7931 Secondary malignant neoplasm of brain: Secondary | ICD-10-CM | POA: Diagnosis not present

## 2015-07-16 DIAGNOSIS — C3432 Malignant neoplasm of lower lobe, left bronchus or lung: Secondary | ICD-10-CM | POA: Diagnosis not present

## 2015-07-16 DIAGNOSIS — Z5112 Encounter for antineoplastic immunotherapy: Secondary | ICD-10-CM

## 2015-07-16 DIAGNOSIS — Z720 Tobacco use: Secondary | ICD-10-CM

## 2015-07-16 DIAGNOSIS — F1010 Alcohol abuse, uncomplicated: Secondary | ICD-10-CM

## 2015-07-16 DIAGNOSIS — Z9889 Other specified postprocedural states: Secondary | ICD-10-CM

## 2015-07-16 DIAGNOSIS — F329 Major depressive disorder, single episode, unspecified: Secondary | ICD-10-CM | POA: Diagnosis not present

## 2015-07-16 DIAGNOSIS — R531 Weakness: Secondary | ICD-10-CM

## 2015-07-16 DIAGNOSIS — E440 Moderate protein-calorie malnutrition: Secondary | ICD-10-CM

## 2015-07-16 LAB — CBC WITH DIFFERENTIAL/PLATELET
BASO%: 0.5 % (ref 0.0–2.0)
BASOS ABS: 0.1 10*3/uL (ref 0.0–0.1)
EOS%: 10.2 % — AB (ref 0.0–7.0)
Eosinophils Absolute: 1 10*3/uL — ABNORMAL HIGH (ref 0.0–0.5)
HEMATOCRIT: 45.8 % (ref 38.4–49.9)
HGB: 16 g/dL (ref 13.0–17.1)
LYMPH%: 22.8 % (ref 14.0–49.0)
MCH: 33 pg (ref 27.2–33.4)
MCHC: 34.9 g/dL (ref 32.0–36.0)
MCV: 94.4 fL (ref 79.3–98.0)
MONO#: 1.1 10*3/uL — ABNORMAL HIGH (ref 0.1–0.9)
MONO%: 10.8 % (ref 0.0–14.0)
NEUT%: 55.7 % (ref 39.0–75.0)
NEUTROS ABS: 5.4 10*3/uL (ref 1.5–6.5)
PLATELETS: 194 10*3/uL (ref 140–400)
RBC: 4.85 10*6/uL (ref 4.20–5.82)
RDW: 13.8 % (ref 11.0–14.6)
WBC: 9.7 10*3/uL (ref 4.0–10.3)
lymph#: 2.2 10*3/uL (ref 0.9–3.3)
nRBC: 0 % (ref 0–0)

## 2015-07-16 LAB — COMPREHENSIVE METABOLIC PANEL
ALT: 14 U/L (ref 0–55)
ANION GAP: 9 meq/L (ref 3–11)
AST: 20 U/L (ref 5–34)
Albumin: 3.5 g/dL (ref 3.5–5.0)
Alkaline Phosphatase: 96 U/L (ref 40–150)
BUN: 12.6 mg/dL (ref 7.0–26.0)
CALCIUM: 8.9 mg/dL (ref 8.4–10.4)
CHLORIDE: 108 meq/L (ref 98–109)
CO2: 22 mEq/L (ref 22–29)
CREATININE: 0.8 mg/dL (ref 0.7–1.3)
Glucose: 84 mg/dl (ref 70–140)
Potassium: 4.1 mEq/L (ref 3.5–5.1)
Sodium: 139 mEq/L (ref 136–145)
Total Protein: 6.6 g/dL (ref 6.4–8.3)

## 2015-07-16 MED ORDER — SODIUM CHLORIDE 0.9 % IV SOLN
Freq: Once | INTRAVENOUS | Status: AC
  Administered 2015-07-16: 10:00:00 via INTRAVENOUS

## 2015-07-16 MED ORDER — FAMOTIDINE 20 MG PO TABS: 20.0000 mg | ORAL_TABLET | Freq: Once | ORAL | Status: DC

## 2015-07-16 MED ORDER — SODIUM CHLORIDE 0.9 % IV SOLN
200.0000 mg | Freq: Once | INTRAVENOUS | Status: AC
  Administered 2015-07-16: 200 mg via INTRAVENOUS
  Filled 2015-07-16: qty 8

## 2015-07-16 NOTE — Telephone Encounter (Signed)
Gave and printed appt sched and avs for pt for July thru Sept

## 2015-07-16 NOTE — Progress Notes (Signed)
[facility] [phone]   [phone]  OFFICE PROGRESS NOTE  FRIED, [name], MD [address] [place] [address]  DIAGNOSIS: Stage IV (T2a, N1, M1b) non-small cell lung cancer, adenocarcinoma, negative EGFR mutation but equivocal EGFR amplification, negative ALK gene translocation and negative ROS 1but with PDL-1 expression 100% presented with left lower lobe lung mass in addition to left hilar adenopathy and solitary metastatic brain lesion diagnosed in December 2016.  PRIOR THERAPY:  1) status post stereotactic radiotherapy and surgical resection diagnosed in December 2016. 2) status post video bronchoscopy with left VATS and wedge resection of the left upper lobe and left lower lobe superior segmentectomy with lymph node dissection under the care of Dr. Servando Snare on 01/14/2015.  CURRENT THERAPY: Immunotherapy with Ketruda (pembrolizumab) 200 MG IV every 3 weeks, first dose 02/18/2015. Status post 7 cycles.  INTERVAL HISTORY: Timothy Obrien 55 y.o. male returns to the clinic today for follow-up visit. The patient is doing fine today with no specific complaints. He tolerated the last cycle of his treatment with immunotherapy with Ketruda (pembrolizumab) with no significant adverse effects. He denied having any skin rash or diarrhea. He has no nausea or vomiting, no fever or chills. He has no significant chest pain, shortness of breath, cough or hemoptysis.Marland Kitchen He denied having any fever or chills. He is here today to start cycle #8 of his treatment.  MEDICAL HISTORY: Past Medical History  Diagnosis Date  . Lung cancer ([place])     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  . Pneumonia   . Bronchitis   . Tobacco abuse   . Seizures ([place])     last 12/11/14  . Shortness of breath dyspnea     with exertion    ALLERGIES:  has No Known Allergies.  MEDICATIONS:  Current Outpatient Prescriptions  Medication Sig Dispense Refill  . mirtazapine  (REMERON) 30 MG tablet TAKE 1 TABLET (30 MG TOTAL) BY MOUTH AT BEDTIME. 30 tablet 2   No current facility-administered medications for this visit.    SURGICAL HISTORY:  Past Surgical History  Procedure Laterality Date  . Hernia repair    . Vasectomy    . Video bronchoscopy Bilateral 12/16/2014    Procedure: VIDEO BRONCHOSCOPY WITH FLUORO;  Surgeon: Collene Gobble, MD;  Location: [facility];  Service: Cardiopulmonary;  Laterality: Bilateral;  . Craniotomy N/A 12/26/2014    Procedure: CRANIOTOMY TUMOR EXCISION with BrainLab;  Surgeon: Kevan Ny Ditty, MD;  Location: [facility];  Service: Neurosurgery;  Laterality: N/A;  CRANIOTOMY TUMOR EXCISION with Stealth  . Application of cranial navigation N/A 12/26/2014    Procedure: APPLICATION OF CRANIAL NAVIGATION;  Surgeon: Kevan Ny Ditty, MD;  Location: [facility];  Service: Neurosurgery;  Laterality: N/A;  . Video bronchoscopy N/A 01/14/2015    Procedure: VIDEO BRONCHOSCOPY;  Surgeon: Grace Isaac, MD;  Location: [facility];  Service: Thoracic;  Laterality: N/A;  . Video assisted thoracoscopy (vats)/wedge resection Left 01/14/2015    Procedure: VIDEO ASSISTED THORACOSCOPY (VATS)/WEDGE RESECTION, Superior segmentectomy left  lower lobe, wedge resection of left upper lobe, multiple lymph node disection, On Q insertion.;  Surgeon: Grace Isaac, MD;  Location: [facility];  Service: Thoracic;  Laterality: Left;    REVIEW OF SYSTEMS:  A comprehensive review of systems was negative.   PHYSICAL EXAMINATION: General appearance: alert, cooperative, fatigued and no distress Head: Normocephalic, without obvious abnormality, atraumatic Neck: no adenopathy, no  JVD, supple, symmetrical, trachea midline and thyroid not enlarged, symmetric, no tenderness/mass/nodules Lymph nodes: Cervical, supraclavicular, and axillary nodes normal. Resp: clear to auscultation bilaterally Back: symmetric, no curvature. ROM normal. No CVA tenderness. Cardio: regular  rate and rhythm, S1, S2 normal, no murmur, click, rub or gallop GI: soft, non-tender; bowel sounds normal; no masses,  no organomegaly Extremities: extremities normal, atraumatic, no cyanosis or edema Neurologic: Alert and oriented X 3, normal strength and tone. Normal symmetric reflexes. Normal coordination and gait  ECOG PERFORMANCE STATUS: 1 - Symptomatic but completely ambulatory  Blood pressure 136/82, pulse 71, temperature 97.7 F (36.5 C), temperature source Oral, resp. rate 18, height 6" (0.152 m), weight 164 lb (74.39 kg), SpO2 100 %.  LABORATORY DATA: Lab Results  Component Value Date   WBC 9.7 07/16/2015   HGB 16.0 07/16/2015   HCT 45.8 07/16/2015   MCV 94.4 07/16/2015   PLT 194 07/16/2015      Chemistry      Component Value Date/Time   NA 139 07/16/2015 0908   NA 138 01/16/2015 0450   K 4.1 07/16/2015 0908   K 4.1 01/16/2015 0450   CL 109 01/16/2015 0450   CO2 22 07/16/2015 0908   CO2 22 01/16/2015 0450   BUN 12.6 07/16/2015 0908   BUN 6 01/16/2015 0450   CREATININE 0.8 07/16/2015 0908   CREATININE 0.68 01/16/2015 0450      Component Value Date/Time   CALCIUM 8.9 07/16/2015 0908   CALCIUM 8.3* 01/16/2015 0450   ALKPHOS 96 07/16/2015 0908   ALKPHOS 60 01/16/2015 0450   AST 20 07/16/2015 0908   AST 31 01/16/2015 0450   ALT 14 07/16/2015 0908   ALT 26 01/16/2015 0450   BILITOT <0.30 07/16/2015 0908   BILITOT 0.9 01/16/2015 0450       RADIOGRAPHIC STUDIES: Ct Chest W Contrast  06/23/2015  CLINICAL DATA:  Patient with history of metastatic lung cancer diagnosed 12/2014 status post partial left lung resection. On immunotherapy. EXAM: CT CHEST, ABDOMEN, AND PELVIS WITH CONTRAST TECHNIQUE: Multidetector CT imaging of the chest, abdomen and pelvis was performed following the standard protocol during bolus administration of intravenous contrast. CONTRAST:  ISOVUE-300 IOPAMIDOL (ISOVUE-300) INJECTION 61% COMPARISON:  CT CAP 04/20/2015. FINDINGS: CT CHEST  FINDINGS Mediastinum/Lymph Nodes: No enlarged axillary, mediastinal or hilar lymphadenopathy. Normal heart size. No pericardial effusion. Aorta and main pulmonary artery normal in caliber. Coronary arterial vascular calcifications. Lungs/Pleura: Central airways are patent. Probable dependent mucus within the right mainstem bronchus. Re- demonstrated centrilobular emphysematous change. Grossly unchanged 8 x 4 mm irregular nodule within the right upper lobe (image 33; series 4), measuring the same on prior examination when measured in the same plane. Re- demonstrated volume loss and scarring within the left perihilar region. Rounded area of consolidation within the superior segment left lower lobe is decreased in size measuring 1.4 x 1.3 cm, previously 1.6 x 1.7 cm. No new or enlarging pulmonary nodules are identified. No pleural effusion or pneumothorax. Musculoskeletal: All no aggressive or acute appearing osseous lesions. Old left 7 rib fracture with callus formation. CT ABDOMEN PELVIS FINDINGS Hepatobiliary: The liver is normal in size and contour. Unchanged 4 mm low-attenuation lesion posterior right hepatic lobe (image 71; series 2). No new or enlarging hepatic lesions are identified. Gallbladder is decompressed. Pancreas: Unremarkable Spleen: Unremarkable Adrenals/Urinary Tract: The adrenal glands are normal. Kidneys enhance symmetrically with contrast. Unchanged sub cm low-attenuation lesion superior pole right kidney, too small to characterize however statistically represents a small  cyst. Urinary bladder is unremarkable. Stomach/Bowel: Sigmoid colonic diverticulosis. No CT evidence for acute diverticulitis. Oral contrast material demonstrated throughout the small and large bowel. No abnormal bowel wall thickening or evidence for bowel obstruction. Normal morphology of the stomach. Vascular/Lymphatic: Normal caliber abdominal aorta. Peripheral calcified atherosclerotic plaque. No retroperitoneal  lymphadenopathy. Other: None. Musculoskeletal: No aggressive or acute appearing osseous lesions. Lumbar spine degenerative changes. IMPRESSION: Interval decrease in size of nodular consolidation within the left lower lobe. Grossly unchanged irregular right upper lobe pulmonary nodule which is nonspecific in etiology and may represent a second site of primary malignancy or metastatic disease. Recommend continued attention on followup. Electronically Signed   By: Lovey Newcomer M.D.   On: 06/23/2015 09:41   Mr [name] YN Contrast  07/07/2015  CLINICAL DATA:  Stage IV lung cancer metastatic to the brain. Continued surveillance. Previous surgery and SRS. EXAM: MRI HEAD WITHOUT AND WITH CONTRAST TECHNIQUE: Multiplanar, multiecho pulse sequences of the brain and surrounding structures were obtained without and with intravenous contrast. CONTRAST:  MultiHance 15 mL. COMPARISON:  04/07/2015. FINDINGS: Continued involution of the LEFT posterior frontal metastasis. Residual cavity now measures 15 x 11 x 17 mm, smaller. Chronic blood products represent postsurgical change. No significant enhancement when precontrast T1 shortening is accounted for. Mild surrounding T2 and FLAIR hyperintensity is also diminished, probably at baseline, representing gliosis. Post infusion, no new lesions are seen. Normal for age cerebral volume. No significant white matter disease aside from that described above. Flow voids are maintained. Extracranial soft tissues unremarkable. IMPRESSION: Continued involution LEFT posterior frontal metastasis. No new lesions are seen. Electronically Signed   By: Staci Righter M.D.   On: 07/07/2015 15:03   Ct Abdomen Pelvis W Contrast  06/23/2015  CLINICAL DATA:  Patient with history of metastatic lung cancer diagnosed 12/2014 status post partial left lung resection. On immunotherapy. EXAM: CT CHEST, ABDOMEN, AND PELVIS WITH CONTRAST TECHNIQUE: Multidetector CT imaging of the chest, abdomen and pelvis was  performed following the standard protocol during bolus administration of intravenous contrast. CONTRAST:  160m ISOVUE-300 IOPAMIDOL (ISOVUE-300) INJECTION 61% COMPARISON:  CT CAP 04/20/2015. FINDINGS: CT CHEST FINDINGS Mediastinum/Lymph Nodes: No enlarged axillary, mediastinal or hilar lymphadenopathy. Normal heart size. No pericardial effusion. Aorta and main pulmonary artery normal in caliber. Coronary arterial vascular calcifications. Lungs/Pleura: Central airways are patent. Probable dependent mucus within the right mainstem bronchus. Re- demonstrated centrilobular emphysematous change. Grossly unchanged 8 x 4 mm irregular nodule within the right upper lobe (image 33; series 4), measuring the same on prior examination when measured in the same plane. Re- demonstrated volume loss and scarring within the left perihilar region. Rounded area of consolidation within the superior segment left lower lobe is decreased in size measuring 1.4 x 1.3 cm, previously 1.6 x 1.7 cm. No new or enlarging pulmonary nodules are identified. No pleural effusion or pneumothorax. Musculoskeletal: All no aggressive or acute appearing osseous lesions. Old left 7 rib fracture with callus formation. CT ABDOMEN PELVIS FINDINGS Hepatobiliary: The liver is normal in size and contour. Unchanged 4 mm low-attenuation lesion posterior right hepatic lobe (image 71; series 2). No new or enlarging hepatic lesions are identified. Gallbladder is decompressed. Pancreas: Unremarkable Spleen: Unremarkable Adrenals/Urinary Tract: The adrenal glands are normal. Kidneys enhance symmetrically with contrast. Unchanged sub cm low-attenuation lesion superior pole right kidney, too small to characterize however statistically represents a small cyst. Urinary bladder is unremarkable. Stomach/Bowel: Sigmoid colonic diverticulosis. No CT evidence for acute diverticulitis. Oral contrast material demonstrated throughout the  small and large bowel. No abnormal bowel  wall thickening or evidence for bowel obstruction. Normal morphology of the stomach. Vascular/Lymphatic: Normal caliber abdominal aorta. Peripheral calcified atherosclerotic plaque. No retroperitoneal lymphadenopathy. Other: None. Musculoskeletal: No aggressive or acute appearing osseous lesions. Lumbar spine degenerative changes. IMPRESSION: Interval decrease in size of nodular consolidation within the left lower lobe. Grossly unchanged irregular right upper lobe pulmonary nodule which is nonspecific in etiology and may represent a second site of primary malignancy or metastatic disease. Recommend continued attention on followup. Electronically Signed   By: Lovey Newcomer M.D.   On: 06/23/2015 09:41    ASSESSMENT AND PLAN: This is a very pleasant 55 years old white male diagnosed with a stage IV non-small cell lung cancer, T2a, N1, M1b) non-small cell lung cancer presented with solitary brain metastasis status post stereotactic radiotherapy followed by craniotomy and resection of the brain tumor. The patient also underwent wedge resection of the left upper lobe as well as left lower lobe superior segmentectomy with lymph node dissection. Unfortunately the left lower lobe resection margin was positive for adenocarcinoma. His molecular study showed equivocal EGFR amplification, negative ALK gene translocation, negative Ros 1 but the immunohistochemical stains showed positive for PDL 1 expression at 100%. He is currently on treatment with Ketruda (pembrolizumab) status post 7 cycles and tolerated his treatment fairly well. I recommended for the patient to continue his current immunotherapy with [place] (pembrolizumab) and he will proceed with cycle #8 today as scheduled. For depression, he will continue on Remeron 30 mg by mouth daily at bedtime. He would come back for follow-up visit in 3 weeks for reevaluation before starting cycle #9. For smoke cessation and alcohol abuse, I strongly encouraged the patient to  cut down on the number of beers and cigarette on daily basis. The patient was advised to call if he has any concerning symptoms in the interval. The patient voices understanding of current disease status and treatment options and is in agreement with the current care plan.  All questions were answered. The patient knows to call the clinic with any problems, questions or concerns. We can certainly see the patient much sooner if necessary.  Disclaimer: This note was dictated with voice recognition software. Similar sounding words can inadvertently be transcribed and may not be corrected upon review.

## 2015-07-16 NOTE — Patient Instructions (Signed)
[facility] Discharge Instructions for Patients Receiving Chemotherapy  Today you received the following chemotherapy agents: Keytruda   To help prevent nausea and vomiting after your treatment, we encourage you to take your nausea medication as directed    If you develop nausea and vomiting that is not controlled by your nausea medication, call the clinic.   BELOW ARE SYMPTOMS THAT SHOULD BE REPORTED IMMEDIATELY:  *FEVER GREATER THAN 100.5 F  *CHILLS WITH OR WITHOUT FEVER  NAUSEA AND VOMITING THAT IS NOT CONTROLLED WITH YOUR NAUSEA MEDICATION  *UNUSUAL SHORTNESS OF BREATH  *UNUSUAL BRUISING OR BLEEDING  TENDERNESS IN MOUTH AND THROAT WITH OR WITHOUT PRESENCE OF ULCERS  *URINARY PROBLEMS  *BOWEL PROBLEMS  UNUSUAL RASH Items with * indicate a potential emergency and should be followed up as soon as possible.  Feel free to call the clinic you have any questions or concerns. The clinic phone number is [phone].  Please show the CHEMO ALERT CARD at check-in to the Emergency Department and triage nurse.   

## 2015-07-26 ENCOUNTER — Other Ambulatory Visit: Payer: Self-pay | Admitting: Nurse Practitioner

## 2015-08-05 ENCOUNTER — Other Ambulatory Visit: Payer: Self-pay | Admitting: Nurse Practitioner

## 2015-08-06 ENCOUNTER — Ambulatory Visit (HOSPITAL_BASED_OUTPATIENT_CLINIC_OR_DEPARTMENT_OTHER): Payer: BLUE CROSS/BLUE SHIELD

## 2015-08-06 ENCOUNTER — Other Ambulatory Visit (HOSPITAL_BASED_OUTPATIENT_CLINIC_OR_DEPARTMENT_OTHER): Payer: BLUE CROSS/BLUE SHIELD

## 2015-08-06 ENCOUNTER — Ambulatory Visit (HOSPITAL_BASED_OUTPATIENT_CLINIC_OR_DEPARTMENT_OTHER): Payer: BLUE CROSS/BLUE SHIELD | Admitting: Nurse Practitioner

## 2015-08-06 ENCOUNTER — Encounter: Payer: Self-pay | Admitting: Nurse Practitioner

## 2015-08-06 VITALS — BP 130/79

## 2015-08-06 VITALS — BP 128/92 | HR 68 | Temp 98.0°F | Resp 18 | Ht 72.0 in | Wt 164.9 lb

## 2015-08-06 DIAGNOSIS — Z79899 Other long term (current) drug therapy: Secondary | ICD-10-CM

## 2015-08-06 DIAGNOSIS — E440 Moderate protein-calorie malnutrition: Secondary | ICD-10-CM

## 2015-08-06 DIAGNOSIS — C3432 Malignant neoplasm of lower lobe, left bronchus or lung: Secondary | ICD-10-CM | POA: Diagnosis not present

## 2015-08-06 DIAGNOSIS — M25512 Pain in left shoulder: Secondary | ICD-10-CM | POA: Diagnosis not present

## 2015-08-06 DIAGNOSIS — R531 Weakness: Secondary | ICD-10-CM

## 2015-08-06 DIAGNOSIS — Z5112 Encounter for antineoplastic immunotherapy: Secondary | ICD-10-CM | POA: Diagnosis not present

## 2015-08-06 DIAGNOSIS — Z9889 Other specified postprocedural states: Secondary | ICD-10-CM

## 2015-08-06 DIAGNOSIS — C7931 Secondary malignant neoplasm of brain: Secondary | ICD-10-CM

## 2015-08-06 DIAGNOSIS — Z720 Tobacco use: Secondary | ICD-10-CM

## 2015-08-06 LAB — CBC WITH DIFFERENTIAL/PLATELET
BASO%: 0.5 % (ref 0.0–2.0)
BASOS ABS: 0.1 10*3/uL (ref 0.0–0.1)
EOS ABS: 0.8 10*3/uL — AB (ref 0.0–0.5)
EOS%: 8.6 % — ABNORMAL HIGH (ref 0.0–7.0)
HCT: 48.3 % (ref 38.4–49.9)
HEMOGLOBIN: 17.1 g/dL (ref 13.0–17.1)
LYMPH%: 34.9 % (ref 14.0–49.0)
MCH: 33.3 pg (ref 27.2–33.4)
MCHC: 35.4 g/dL (ref 32.0–36.0)
MCV: 94 fL (ref 79.3–98.0)
MONO#: 0.9 10*3/uL (ref 0.1–0.9)
MONO%: 9.6 % (ref 0.0–14.0)
NEUT#: 4.4 10*3/uL (ref 1.5–6.5)
NEUT%: 46.4 % (ref 39.0–75.0)
Platelets: 284 10*3/uL (ref 140–400)
RBC: 5.14 10*6/uL (ref 4.20–5.82)
RDW: 13.7 % (ref 11.0–14.6)
WBC: 9.5 10*3/uL (ref 4.0–10.3)
lymph#: 3.3 10*3/uL (ref 0.9–3.3)

## 2015-08-06 LAB — COMPREHENSIVE METABOLIC PANEL
ALBUMIN: 3.9 g/dL (ref 3.5–5.0)
ALK PHOS: 116 U/L (ref 40–150)
ALT: 18 U/L (ref 0–55)
AST: 21 U/L (ref 5–34)
Anion Gap: 11 mEq/L (ref 3–11)
BUN: 18.4 mg/dL (ref 7.0–26.0)
CALCIUM: 9.2 mg/dL (ref 8.4–10.4)
CHLORIDE: 109 meq/L (ref 98–109)
CO2: 17 mEq/L — ABNORMAL LOW (ref 22–29)
Creatinine: 0.9 mg/dL (ref 0.7–1.3)
GLUCOSE: 95 mg/dL (ref 70–140)
POTASSIUM: 4.3 meq/L (ref 3.5–5.1)
SODIUM: 136 meq/L (ref 136–145)
Total Bilirubin: 0.38 mg/dL (ref 0.20–1.20)
Total Protein: 7.4 g/dL (ref 6.4–8.3)

## 2015-08-06 LAB — TSH: TSH: 0.811 m[IU]/L (ref 0.320–4.118)

## 2015-08-06 MED ORDER — SODIUM CHLORIDE 0.9 % IV SOLN
200.0000 mg | Freq: Once | INTRAVENOUS | Status: AC
  Administered 2015-08-06: 200 mg via INTRAVENOUS
  Filled 2015-08-06: qty 8

## 2015-08-06 MED ORDER — SODIUM CHLORIDE 0.9 % IV SOLN
Freq: Once | INTRAVENOUS | Status: AC
  Administered 2015-08-06: 10:00:00 via INTRAVENOUS

## 2015-08-06 NOTE — Progress Notes (Signed)
Established patient  HPI:  Timothy Obrien 55 y.o. male diagnosed with lung cancer with brain metastasis.  Patient is status post craniotomy in the past.  Currently undergoing Keytruda therapy.  Patient presented to the [facility] today to receive cycle 10 of his Keytruda therapy.     No history exists.    Review of Systems  Musculoskeletal: Positive for joint pain.  All other systems reviewed and are negative.   Past Medical History:  Diagnosis Date  . Bronchitis   . Lung cancer ([place])    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  . Pneumonia   . Seizures ([place])    last 12/11/14  . Shortness of breath dyspnea    with exertion  . Tobacco abuse     Past Surgical History:  Procedure Laterality Date  . APPLICATION OF CRANIAL NAVIGATION N/A 12/26/2014   Procedure: APPLICATION OF CRANIAL NAVIGATION;  Surgeon: Kevan Ny Ditty, MD;  Location: [facility];  Service: Neurosurgery;  Laterality: N/A;  . CRANIOTOMY N/A 12/26/2014   Procedure: CRANIOTOMY TUMOR EXCISION with BrainLab;  Surgeon: Kevan Ny Ditty, MD;  Location: [facility];  Service: Neurosurgery;  Laterality: N/A;  CRANIOTOMY TUMOR EXCISION with Stealth  . HERNIA REPAIR    . VASECTOMY    . VIDEO ASSISTED THORACOSCOPY (VATS)/WEDGE RESECTION Left 01/14/2015   Procedure: VIDEO ASSISTED THORACOSCOPY (VATS)/WEDGE RESECTION, Superior segmentectomy left  lower lobe, wedge resection of left upper lobe, multiple lymph node disection, On Q insertion.;  Surgeon: Grace Isaac, MD;  Location: [facility];  Service: Thoracic;  Laterality: Left;  Marland Kitchen VIDEO BRONCHOSCOPY Bilateral 12/16/2014   Procedure: VIDEO BRONCHOSCOPY WITH FLUORO;  Surgeon: Collene Gobble, MD;  Location: [facility];  Service: Cardiopulmonary;  Laterality: Bilateral;  . VIDEO BRONCHOSCOPY N/A 01/14/2015   Procedure: VIDEO BRONCHOSCOPY;  Surgeon: Grace Isaac, MD;  Location: [facility];  Service: Thoracic;  Laterality: N/A;    has Right sided weakness; Tobacco  abuse; Primary cancer of left lower lobe of lung ([place]); S/P craniotomy; Brain metastasis ([place]); Malnutrition of moderate degree; Encounter for antineoplastic immunotherapy; and Shoulder pain, left on his problem list.    has No Known Allergies.    Medication List       Accurate as of 08/06/15  9:54 AM. Always use your most recent med list.          mirtazapine 30 MG tablet Commonly known as:  REMERON TAKE 1 TABLET (30 MG TOTAL) BY MOUTH AT BEDTIME.        PHYSICAL EXAMINATION  Oncology Vitals 08/06/2015 07/16/2015  Height 183 cm 15 cm  Weight 74.798 kg 74.39 kg  Weight (lbs) 164 lbs 14 oz 164 lbs  BMI (kg/m2) 22.36 kg/m2 3202.91 kg/m2  Temp 98 97.7  Pulse 68 71  Resp 18 18  SpO2 100 100  BSA (m2) 1.95 m2 0.56 m2   BP Readings from Last 2 Encounters:  08/06/15 (!) 128/92  07/16/15 136/82    Physical Exam  Constitutional: He is oriented to person, place, and time and well-developed, well-nourished, and in no distress.  HENT:  Head: Normocephalic and atraumatic.  Eyes: Conjunctivae and EOM are normal. Pupils are equal, round, and reactive to light. Right eye exhibits no discharge. Left eye exhibits no discharge. No scleral icterus.  Neck: Normal range of motion. Neck supple.  Pulmonary/Chest: Effort normal and breath sounds normal. No respiratory distress. He has no wheezes. He has no rales. He exhibits no tenderness.  Abdominal:  Soft. Bowel sounds are normal. He exhibits no distension and no mass. There is no tenderness. There is no rebound and no guarding.  Musculoskeletal: He exhibits no edema, tenderness or deformity.  Neurological: He is alert and oriented to person, place, and time. Gait normal.  Skin: Skin is warm and dry.  Psychiatric: Affect normal.  Nursing note and vitals reviewed.   LABORATORY DATA:. Appointment on 08/06/2015  Component Date Value Ref Range Status  . WBC 08/06/2015 9.5  4.0 - 10.3 10e3/uL Final  . NEUT# 08/06/2015 4.4  1.5 - 6.5 10e3/uL  Final  . HGB 08/06/2015 17.1  13.0 - 17.1 g/dL Final  . HCT 08/06/2015 48.3  38.4 - 49.9 % Final  . Platelets 08/06/2015 284  140 - 400 10e3/uL Final  . MCV 08/06/2015 94.0  79.3 - 98.0 fL Final  . MCH 08/06/2015 33.3  27.2 - 33.4 pg Final  . MCHC 08/06/2015 35.4  32.0 - 36.0 g/dL Final  . RBC 08/06/2015 5.14  4.20 - 5.82 10e6/uL Final  . RDW 08/06/2015 13.7  11.0 - 14.6 % Final  . lymph# 08/06/2015 3.3  0.9 - 3.3 10e3/uL Final  . MONO# 08/06/2015 0.9  0.1 - 0.9 10e3/uL Final  . Eosinophils Absolute 08/06/2015 0.8* 0.0 - 0.5 10e3/uL Final  . Basophils Absolute 08/06/2015 0.1  0.0 - 0.1 10e3/uL Final  . NEUT% 08/06/2015 46.4  39.0 - 75.0 % Final  . LYMPH% 08/06/2015 34.9  14.0 - 49.0 % Final  . MONO% 08/06/2015 9.6  0.0 - 14.0 % Final  . EOS% 08/06/2015 8.6* 0.0 - 7.0 % Final  . BASO% 08/06/2015 0.5  0.0 - 2.0 % Final  . Sodium 08/06/2015 136  136 - 145 mEq/L Final  . Potassium 08/06/2015 4.3  3.5 - 5.1 mEq/L Final  . Chloride 08/06/2015 109  98 - 109 mEq/L Final  . CO2 08/06/2015 17* 22 - 29 mEq/L Final  . Glucose 08/06/2015 95  70 - 140 mg/dl Final  . BUN 08/06/2015 18.4  7.0 - 26.0 mg/dL Final  . Creatinine 08/06/2015 0.9  0.7 - 1.3 mg/dL Final  . Total Bilirubin 08/06/2015 0.38  0.20 - 1.20 mg/dL Final  . Alkaline Phosphatase 08/06/2015 116  40 - 150 U/L Final  . AST 08/06/2015 21  5 - 34 U/L Final  . ALT 08/06/2015 18  0 - 55 U/L Final  . Total Protein 08/06/2015 7.4  6.4 - 8.3 g/dL Final  . Albumin 08/06/2015 3.9  3.5 - 5.0 g/dL Final  . Calcium 08/06/2015 9.2  8.4 - 10.4 mg/dL Final  . Anion Gap 08/06/2015 11  3 - 11 mEq/L Final  . EGFR 08/06/2015 >90  >90 ml/min/1.73 m2 Final    RADIOGRAPHIC STUDIES: No results found.  ASSESSMENT/PLAN:    Shoulder pain, left Patient states that he's been experiencing some left shoulder pain for approximately one week.  He denies any known injury or trauma to his shoulder area.  He states he has decreased range of motion; is only able  to lift his left arm to the level of his shoulders.  He denies any numbness or tingling.  He denies any other neurological issues whatsoever.  Patient was advised to continue monitoring the left shoulder pain; and let us know if he continues.  May need to consider imaging if symptoms do not resolve.  Primary cancer of left lower lobe of lung [facility]) Patient presented to the [facility] today to receive cycle 10 of his Keytruda therapy.  Patient states that he is doing fairly well recently; and denies any new symptoms whatsoever.  He also denies any recent fevers or chills.   Blood counts obtained today were all within normal limits.  Patient will proceed today with his treatment as scheduled.  Also, patient states that he always refuses any Benadryl; stating that he drive himself home.  He has also requested a therapy no further Pepcid as a premedication as well-stating that hit has made no difference whether he takes it or not in the past.  This provider reviewed this request with the [facility] pharmacist; and the Pepcid was removed as a premedication.  Patient also mentioned that he is completely off of all of his Keppra as well.  Patient is scheduled to return on 08/27/2015 for labs, visit, and next cycle of [place].    Patient stated understanding of all instructions; and was in agreement with this plan of care. The patient knows to call the clinic with any problems, questions or concerns.   Total time spent with patien15 minutes;  with greater than 75 percent of that time spent in face to face counseling regarding patient's symptoms,  and coordination of care and follow up.  Disclaimer:This dictation was prepared with Dragon/digital dictation along with [employer]. Any transcriptional errors that result from this process are unintentional.  Drue Second, NP 08/06/2015

## 2015-08-06 NOTE — Assessment & Plan Note (Signed)
Patient presented to the [facility] today to receive cycle 10 of his Keytruda therapy.  Patient states that he is doing fairly well recently; and denies any new symptoms whatsoever.  He also denies any recent fevers or chills.   Blood counts obtained today were all within normal limits.  Patient will proceed today with his treatment as scheduled.  Also, patient states that he always refuses any Benadryl; stating that he drive himself home.  He has also requested a therapy no further Pepcid as a premedication as well-stating that hit has made no difference whether he takes it or not in the past.  This provider reviewed this request with the [facility] pharmacist; and the Pepcid was removed as a premedication.  Patient also mentioned that he is completely off of all of his Keppra as well.  Patient is scheduled to return on 08/27/2015 for labs, visit, and next cycle of [place].

## 2015-08-06 NOTE — Assessment & Plan Note (Signed)
Patient states that he's been experiencing some left shoulder pain for approximately one week.  He denies any known injury or trauma to his shoulder area.  He states he has decreased range of motion; is only able to lift his left arm to the level of his shoulders.  He denies any numbness or tingling.  He denies any other neurological issues whatsoever.  Patient was advised to continue monitoring the left shoulder pain; and let us know if he continues.  May need to consider imaging if symptoms do not resolve.

## 2015-08-27 ENCOUNTER — Other Ambulatory Visit (HOSPITAL_BASED_OUTPATIENT_CLINIC_OR_DEPARTMENT_OTHER): Payer: BLUE CROSS/BLUE SHIELD

## 2015-08-27 ENCOUNTER — Ambulatory Visit (HOSPITAL_BASED_OUTPATIENT_CLINIC_OR_DEPARTMENT_OTHER): Payer: BLUE CROSS/BLUE SHIELD | Admitting: Internal Medicine

## 2015-08-27 ENCOUNTER — Encounter: Payer: Self-pay | Admitting: Internal Medicine

## 2015-08-27 ENCOUNTER — Ambulatory Visit (HOSPITAL_BASED_OUTPATIENT_CLINIC_OR_DEPARTMENT_OTHER): Payer: BLUE CROSS/BLUE SHIELD

## 2015-08-27 VITALS — BP 146/97 | HR 80 | Temp 97.4°F | Resp 18 | Ht 72.0 in | Wt 168.1 lb

## 2015-08-27 DIAGNOSIS — C3432 Malignant neoplasm of lower lobe, left bronchus or lung: Secondary | ICD-10-CM

## 2015-08-27 DIAGNOSIS — Z5112 Encounter for antineoplastic immunotherapy: Secondary | ICD-10-CM

## 2015-08-27 DIAGNOSIS — F1010 Alcohol abuse, uncomplicated: Secondary | ICD-10-CM | POA: Diagnosis not present

## 2015-08-27 DIAGNOSIS — C7931 Secondary malignant neoplasm of brain: Secondary | ICD-10-CM

## 2015-08-27 DIAGNOSIS — Z720 Tobacco use: Secondary | ICD-10-CM

## 2015-08-27 LAB — COMPREHENSIVE METABOLIC PANEL
ALT: 22 U/L (ref 0–55)
AST: 26 U/L (ref 5–34)
Albumin: 3.9 g/dL (ref 3.5–5.0)
Alkaline Phosphatase: 118 U/L (ref 40–150)
Anion Gap: 9 mEq/L (ref 3–11)
BUN: 15.5 mg/dL (ref 7.0–26.0)
CHLORIDE: 108 meq/L (ref 98–109)
CO2: 20 meq/L — AB (ref 22–29)
Calcium: 9.4 mg/dL (ref 8.4–10.4)
Creatinine: 0.9 mg/dL (ref 0.7–1.3)
GLUCOSE: 85 mg/dL (ref 70–140)
POTASSIUM: 4.3 meq/L (ref 3.5–5.1)
SODIUM: 138 meq/L (ref 136–145)
Total Bilirubin: 0.39 mg/dL (ref 0.20–1.20)
Total Protein: 7.4 g/dL (ref 6.4–8.3)

## 2015-08-27 LAB — CBC WITH DIFFERENTIAL/PLATELET
BASO%: 1.1 % (ref 0.0–2.0)
BASOS ABS: 0.1 10*3/uL (ref 0.0–0.1)
EOS ABS: 0.4 10*3/uL (ref 0.0–0.5)
EOS%: 4.1 % (ref 0.0–7.0)
HCT: 50.9 % — ABNORMAL HIGH (ref 38.4–49.9)
HGB: 17 g/dL (ref 13.0–17.1)
LYMPH%: 28.2 % (ref 14.0–49.0)
MCH: 32.2 pg (ref 27.2–33.4)
MCHC: 33.5 g/dL (ref 32.0–36.0)
MCV: 96.2 fL (ref 79.3–98.0)
MONO#: 0.9 10*3/uL (ref 0.1–0.9)
MONO%: 8.4 % (ref 0.0–14.0)
NEUT%: 58.2 % (ref 39.0–75.0)
NEUTROS ABS: 6.4 10*3/uL (ref 1.5–6.5)
Platelets: 309 10*3/uL (ref 140–400)
RBC: 5.29 10*6/uL (ref 4.20–5.82)
RDW: 13.8 % (ref 11.0–14.6)
WBC: 11 10*3/uL — ABNORMAL HIGH (ref 4.0–10.3)
lymph#: 3.1 10*3/uL (ref 0.9–3.3)

## 2015-08-27 MED ORDER — SODIUM CHLORIDE 0.9 % IV SOLN
Freq: Once | INTRAVENOUS | Status: AC
  Administered 2015-08-27: 11:00:00 via INTRAVENOUS

## 2015-08-27 MED ORDER — SODIUM CHLORIDE 0.9 % IV SOLN
200.0000 mg | Freq: Once | INTRAVENOUS | Status: AC
  Administered 2015-08-27: 200 mg via INTRAVENOUS
  Filled 2015-08-27: qty 8

## 2015-08-27 NOTE — Patient Instructions (Signed)
[facility] Discharge Instructions for Patients Receiving Chemotherapy  Today you received the following chemotherapy agents: Keytruda   To help prevent nausea and vomiting after your treatment, we encourage you to take your nausea medication as directed    If you develop nausea and vomiting that is not controlled by your nausea medication, call the clinic.   BELOW ARE SYMPTOMS THAT SHOULD BE REPORTED IMMEDIATELY:  *FEVER GREATER THAN 100.5 F  *CHILLS WITH OR WITHOUT FEVER  NAUSEA AND VOMITING THAT IS NOT CONTROLLED WITH YOUR NAUSEA MEDICATION  *UNUSUAL SHORTNESS OF BREATH  *UNUSUAL BRUISING OR BLEEDING  TENDERNESS IN MOUTH AND THROAT WITH OR WITHOUT PRESENCE OF ULCERS  *URINARY PROBLEMS  *BOWEL PROBLEMS  UNUSUAL RASH Items with * indicate a potential emergency and should be followed up as soon as possible.  Feel free to call the clinic you have any questions or concerns. The clinic phone number is [phone].  Please show the CHEMO ALERT CARD at check-in to the Emergency Department and triage nurse.   

## 2015-08-27 NOTE — Progress Notes (Signed)
     [facility] [phone]   [phone]  OFFICE PROGRESS NOTE  FRIED, ROBERT L, MD [address] [place] [address]  DIAGNOSIS: Stage IV (T2a, N1, M1b) non-small cell lung cancer, adenocarcinoma, negative EGFR mutation but equivocal EGFR amplification, negative ALK gene translocation and negative ROS 1 but with PDL-1 expression 100% presented with left lower lobe lung mass in addition to left hilar adenopathy and solitary metastatic brain lesion diagnosed in December 2016.  PRIOR THERAPY:  1) status post stereotactic radiotherapy and surgical resection diagnosed in December 2016. 2) status post video bronchoscopy with left VATS and wedge resection of the left upper lobe and left lower lobe superior segmentectomy with lymph node dissection under the care of Dr. Gerhardt on 01/14/2015.  CURRENT THERAPY: Immunotherapy with Ketruda (pembrolizumab) 200 MG IV every 3 weeks, first dose 02/18/2015. Status post 10 cycles.  INTERVAL HISTORY: Timothy Obrien 55 y.o. male returns to the clinic today for follow-up visit. The patient is doing fine today with no specific complaints. He tolerated the last cycle of his treatment with immunotherapy with Ketruda (pembrolizumab) with no significant adverse effects. He denied having any skin rash or diarrhea. He has no nausea or vomiting, no fever or chills. He has no significant chest pain, shortness of breath, cough or hemoptysis. He denied having any fever or chills. He is here today to start cycle #11 of his treatment.  MEDICAL HISTORY: Past Medical History:  Diagnosis Date  . Bronchitis   . Lung cancer (HCC)    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  . Pneumonia   . Seizures (HCC)    last 12/11/14  . Shortness of breath dyspnea    with exertion  . Tobacco abuse     ALLERGIES:  has No Known Allergies.  MEDICATIONS:  Current Outpatient Prescriptions  Medication Sig Dispense Refill  . mirtazapine  (REMERON) 30 MG tablet TAKE 1 TABLET (30 MG TOTAL) BY MOUTH AT BEDTIME. 30 tablet 2   No current facility-administered medications for this visit.     SURGICAL HISTORY:  Past Surgical History:  Procedure Laterality Date  . APPLICATION OF CRANIAL NAVIGATION N/A 12/26/2014   Procedure: APPLICATION OF CRANIAL NAVIGATION;  Surgeon: Benjamin Jared Ditty, MD;  Location: [facility];  Service: Neurosurgery;  Laterality: N/A;  . CRANIOTOMY N/A 12/26/2014   Procedure: CRANIOTOMY TUMOR EXCISION with BrainLab;  Surgeon: Benjamin Jared Ditty, MD;  Location: [facility];  Service: Neurosurgery;  Laterality: N/A;  CRANIOTOMY TUMOR EXCISION with Stealth  . HERNIA REPAIR    . VASECTOMY    . VIDEO ASSISTED THORACOSCOPY (VATS)/WEDGE RESECTION Left 01/14/2015   Procedure: VIDEO ASSISTED THORACOSCOPY (VATS)/WEDGE RESECTION, Superior segmentectomy left  lower lobe, wedge resection of left upper lobe, multiple lymph node disection, On Q insertion.;  Surgeon: Edward B Gerhardt, MD;  Location: [facility] OR;  Service: Thoracic;  Laterality: Left;  . VIDEO BRONCHOSCOPY Bilateral 12/16/2014   Procedure: VIDEO BRONCHOSCOPY WITH FLUORO;  Surgeon: Robert S Byrum, MD;  Location: [facility];  Service: Cardiopulmonary;  Laterality: Bilateral;  . VIDEO BRONCHOSCOPY N/A 01/14/2015   Procedure: VIDEO BRONCHOSCOPY;  Surgeon: Edward B Gerhardt, MD;  Location: [facility] OR;  Service: Thoracic;  Laterality: N/A;    REVIEW OF SYSTEMS:  A comprehensive review of systems was negative.   PHYSICAL EXAMINATION: General appearance: alert, cooperative, fatigued and no distress Head: Normocephalic, without obvious abnormality, atraumatic Neck: no adenopathy, no JVD, supple, symmetrical, trachea midline and   thyroid not enlarged, symmetric, no tenderness/mass/nodules Lymph nodes: Cervical, supraclavicular, and axillary nodes normal. Resp: clear to auscultation bilaterally Back: symmetric, no curvature. ROM normal. No CVA tenderness. Cardio: regular  rate and rhythm, S1, S2 normal, no murmur, click, rub or gallop GI: soft, non-tender; bowel sounds normal; no masses,  no organomegaly Extremities: extremities normal, atraumatic, no cyanosis or edema Neurologic: Alert and oriented X 3, normal strength and tone. Normal symmetric reflexes. Normal coordination and gait  ECOG PERFORMANCE STATUS: 1 - Symptomatic but completely ambulatory  Blood pressure (!) 146/97, pulse 80, temperature 97.4 F (36.3 C), temperature source Oral, resp. rate 18, height 6' (1.829 m), weight 168 lb 1.6 oz (76.2 kg), SpO2 100 %.  LABORATORY DATA: Lab Results  Component Value Date   WBC 11.0 (H) 08/27/2015   HGB 17.0 08/27/2015   HCT 50.9 (H) 08/27/2015   MCV 96.2 08/27/2015   PLT 309 08/27/2015      Chemistry      Component Value Date/Time   NA 138 08/27/2015 0951   K 4.3 08/27/2015 0951   CL 109 01/16/2015 0450   CO2 20 (L) 08/27/2015 0951   BUN 15.5 08/27/2015 0951   CREATININE 0.9 08/27/2015 0951      Component Value Date/Time   CALCIUM 9.4 08/27/2015 0951   ALKPHOS 118 08/27/2015 0951   AST 26 08/27/2015 0951   ALT 22 08/27/2015 0951   BILITOT 0.39 08/27/2015 0951       RADIOGRAPHIC STUDIES: No results found.  ASSESSMENT AND PLAN: This is a very pleasant 55 years old white male diagnosed with a stage IV non-small cell lung cancer, T2a, N1, M1b) non-small cell lung cancer presented with solitary brain metastasis status post stereotactic radiotherapy followed by craniotomy and resection of the brain tumor. The patient also underwent wedge resection of the left upper lobe as well as left lower lobe superior segmentectomy with lymph node dissection. Unfortunately the left lower lobe resection margin was positive for adenocarcinoma. His molecular study showed equivocal EGFR amplification, negative ALK gene translocation, negative Ros 1 but the immunohistochemical stains showed positive for PDL 1 expression at 100%. He is currently on treatment with  Ketruda (pembrolizumab) status post 10 cycles and tolerated his treatment fairly well. I recommended for the patient to continue his current immunotherapy with Ketruda (pembrolizumab) and he will proceed with cycle #11 today as scheduled. He would come back for follow-up visit in 3 weeks for reevaluation before starting cycle #12. For smoke cessation and alcohol abuse, I strongly encouraged the patient to cut down on the number of beers and cigarette on daily basis. The patient was advised to call if he has any concerning symptoms in the interval. The patient voices understanding of current disease status and treatment options and is in agreement with the current care plan.  All questions were answered. The patient knows to call the clinic with any problems, questions or concerns. We can certainly see the patient much sooner if necessary.  Disclaimer: This note was dictated with voice recognition software. Similar sounding words can inadvertently be transcribed and may not be corrected upon review.       

## 2015-09-11 ENCOUNTER — Other Ambulatory Visit: Payer: Self-pay | Admitting: Medical Oncology

## 2015-09-11 MED ORDER — MIRTAZAPINE 30 MG PO TABS: ORAL_TABLET | ORAL | 0 refills | Status: DC

## 2015-09-17 ENCOUNTER — Ambulatory Visit (HOSPITAL_BASED_OUTPATIENT_CLINIC_OR_DEPARTMENT_OTHER): Payer: BLUE CROSS/BLUE SHIELD

## 2015-09-17 ENCOUNTER — Other Ambulatory Visit (HOSPITAL_BASED_OUTPATIENT_CLINIC_OR_DEPARTMENT_OTHER): Payer: BLUE CROSS/BLUE SHIELD

## 2015-09-17 ENCOUNTER — Telehealth: Payer: Self-pay | Admitting: Internal Medicine

## 2015-09-17 ENCOUNTER — Encounter: Payer: Self-pay | Admitting: Internal Medicine

## 2015-09-17 ENCOUNTER — Ambulatory Visit (HOSPITAL_BASED_OUTPATIENT_CLINIC_OR_DEPARTMENT_OTHER): Payer: BLUE CROSS/BLUE SHIELD | Admitting: Internal Medicine

## 2015-09-17 VITALS — BP 139/96 | HR 67 | Temp 98.0°F | Resp 16 | Ht 72.0 in | Wt 167.3 lb

## 2015-09-17 DIAGNOSIS — R5382 Chronic fatigue, unspecified: Secondary | ICD-10-CM

## 2015-09-17 DIAGNOSIS — C3432 Malignant neoplasm of lower lobe, left bronchus or lung: Secondary | ICD-10-CM

## 2015-09-17 DIAGNOSIS — Z720 Tobacco use: Secondary | ICD-10-CM

## 2015-09-17 DIAGNOSIS — Z5112 Encounter for antineoplastic immunotherapy: Secondary | ICD-10-CM | POA: Diagnosis not present

## 2015-09-17 DIAGNOSIS — F1010 Alcohol abuse, uncomplicated: Secondary | ICD-10-CM

## 2015-09-17 HISTORY — DX: Chronic fatigue, unspecified: R53.82

## 2015-09-17 LAB — CBC WITH DIFFERENTIAL/PLATELET
BASO%: 1.2 % (ref 0.0–2.0)
Basophils Absolute: 0.1 10*3/uL (ref 0.0–0.1)
EOS ABS: 0.3 10*3/uL (ref 0.0–0.5)
EOS%: 2.6 % (ref 0.0–7.0)
HCT: 49.6 % (ref 38.4–49.9)
HGB: 16.5 g/dL (ref 13.0–17.1)
LYMPH%: 22.7 % (ref 14.0–49.0)
MCH: 32.3 pg (ref 27.2–33.4)
MCHC: 33.4 g/dL (ref 32.0–36.0)
MCV: 96.9 fL (ref 79.3–98.0)
MONO#: 0.8 10*3/uL (ref 0.1–0.9)
MONO%: 7.4 % (ref 0.0–14.0)
NEUT%: 66.1 % (ref 39.0–75.0)
NEUTROS ABS: 6.9 10*3/uL — AB (ref 1.5–6.5)
Platelets: 277 10*3/uL (ref 140–400)
RBC: 5.12 10*6/uL (ref 4.20–5.82)
RDW: 13.7 % (ref 11.0–14.6)
WBC: 10.4 10*3/uL — AB (ref 4.0–10.3)
lymph#: 2.4 10*3/uL (ref 0.9–3.3)

## 2015-09-17 LAB — COMPREHENSIVE METABOLIC PANEL
ALT: 17 U/L (ref 0–55)
AST: 20 U/L (ref 5–34)
Albumin: 3.9 g/dL (ref 3.5–5.0)
Alkaline Phosphatase: 109 U/L (ref 40–150)
Anion Gap: 11 mEq/L (ref 3–11)
BUN: 15.2 mg/dL (ref 7.0–26.0)
CHLORIDE: 111 meq/L — AB (ref 98–109)
CO2: 18 meq/L — AB (ref 22–29)
Calcium: 9.2 mg/dL (ref 8.4–10.4)
Creatinine: 0.9 mg/dL (ref 0.7–1.3)
GLUCOSE: 94 mg/dL (ref 70–140)
POTASSIUM: 4.2 meq/L (ref 3.5–5.1)
SODIUM: 140 meq/L (ref 136–145)
TOTAL PROTEIN: 7.3 g/dL (ref 6.4–8.3)
Total Bilirubin: <0.3 mg/dL (ref 0.20–1.20)

## 2015-09-17 MED ORDER — SODIUM CHLORIDE 0.9 % IV SOLN
Freq: Once | INTRAVENOUS | Status: AC
  Administered 2015-09-17: 10:00:00 via INTRAVENOUS

## 2015-09-17 MED ORDER — SODIUM CHLORIDE 0.9 % IV SOLN
200.0000 mg | Freq: Once | INTRAVENOUS | Status: AC
  Administered 2015-09-17: 200 mg via INTRAVENOUS
  Filled 2015-09-17: qty 4

## 2015-09-17 NOTE — Telephone Encounter (Signed)
Appointments complete per 9/7 los. Patient will get print out in inf area. Central radiology will call re scan.

## 2015-09-17 NOTE — Patient Instructions (Signed)
Smoking Cessation, Tips for Success If you are ready to quit smoking, congratulations! You have chosen to help yourself be healthier. Cigarettes bring nicotine, tar, carbon monoxide, and other irritants into your body. Your lungs, heart, and blood vessels will be able to work better without these poisons. There are many different ways to quit smoking. Nicotine gum, nicotine patches, a nicotine inhaler, or nicotine nasal spray can help with physical craving. Hypnosis, support groups, and medicines help break the habit of smoking. WHAT THINGS CAN I DO TO MAKE QUITTING EASIER?  Here are some tips to help you quit for good:  Pick a date when you will quit smoking completely. Tell all of your friends and family about your plan to quit on that date.  Do not try to slowly cut down on the number of cigarettes you are smoking. Pick a quit date and quit smoking completely starting on that day.  Throw away all cigarettes.   Clean and remove all ashtrays from your home, work, and car.  On a card, write down your reasons for quitting. Carry the card with you and read it when you get the urge to smoke.  Cleanse your body of nicotine. Drink enough water and fluids to keep your urine clear or pale yellow. Do this after quitting to flush the nicotine from your body.  Learn to predict your moods. Do not let a bad situation be your excuse to have a cigarette. Some situations in your life might tempt you into wanting a cigarette.  Never have "just one" cigarette. It leads to wanting another and another. Remind yourself of your decision to quit.  Change habits associated with smoking. If you smoked while driving or when feeling stressed, try other activities to replace smoking. Stand up when drinking your coffee. Brush your teeth after eating. Sit in a different chair when you read the paper. Avoid alcohol while trying to quit, and try to drink fewer caffeinated beverages. Alcohol and caffeine may urge you to  smoke.  Avoid foods and drinks that can trigger a desire to smoke, such as sugary or spicy foods and alcohol.  Ask people who smoke not to smoke around you.  Have something planned to do right after eating or having a cup of coffee. For example, plan to take a walk or exercise.  Try a relaxation exercise to calm you down and decrease your stress. Remember, you may be tense and nervous for the first 2 weeks after you quit, but this will pass.  Find new activities to keep your hands busy. Play with a pen, coin, or rubber band. Doodle or draw things on paper.  Brush your teeth right after eating. This will help cut down on the craving for the taste of tobacco after meals. You can also try mouthwash.   Use oral substitutes in place of cigarettes. Try using lemon drops, carrots, cinnamon sticks, or chewing gum. Keep them handy so they are available when you have the urge to smoke.  When you have the urge to smoke, try deep breathing.  Designate your home as a nonsmoking area.  If you are a heavy smoker, ask your health care provider about a prescription for nicotine chewing gum. It can ease your withdrawal from nicotine.  Reward yourself. Set aside the cigarette money you save and buy yourself something nice.  Look for support from others. Join a support group or smoking cessation program. Ask someone at home or at work to help you with your plan   to quit smoking.  Always ask yourself, "Do I need this cigarette or is this just a reflex?" Tell yourself, "Today, I choose not to smoke," or "I do not want to smoke." You are reminding yourself of your decision to quit.  Do not replace cigarette smoking with electronic cigarettes (commonly called e-cigarettes). The safety of e-cigarettes is unknown, and some may contain harmful chemicals.  If you relapse, do not give up! Plan ahead and think about what you will do the next time you get the urge to smoke. HOW WILL I FEEL WHEN I QUIT SMOKING? You  may have symptoms of withdrawal because your body is used to nicotine (the addictive substance in cigarettes). You may crave cigarettes, be irritable, feel very hungry, cough often, get headaches, or have difficulty concentrating. The withdrawal symptoms are only temporary. They are strongest when you first quit but will go away within 10-14 days. When withdrawal symptoms occur, stay in control. Think about your reasons for quitting. Remind yourself that these are signs that your body is healing and getting used to being without cigarettes. Remember that withdrawal symptoms are easier to treat than the major diseases that smoking can cause.  Even after the withdrawal is over, expect periodic urges to smoke. However, these cravings are generally short lived and will go away whether you smoke or not. Do not smoke! WHAT RESOURCES ARE AVAILABLE TO HELP ME QUIT SMOKING? Your health care provider can direct you to community resources or hospitals for support, which may include:  Group support.  Education.  Hypnosis.  Therapy.   This information is not intended to replace advice given to you by your health care provider. Make sure you discuss any questions you have with your health care provider.   Document Released: 09/25/2003 Document Revised: 01/17/2014 Document Reviewed: 06/14/2012 Elsevier Interactive Patient Education 2016 [employer]  

## 2015-09-17 NOTE — Patient Instructions (Signed)
[facility] Discharge Instructions for Patients Receiving Chemotherapy  Today you received the following chemotherapy agents: Keytruda   To help prevent nausea and vomiting after your treatment, we encourage you to take your nausea medication as directed    If you develop nausea and vomiting that is not controlled by your nausea medication, call the clinic.   BELOW ARE SYMPTOMS THAT SHOULD BE REPORTED IMMEDIATELY:  *FEVER GREATER THAN 100.5 F  *CHILLS WITH OR WITHOUT FEVER  NAUSEA AND VOMITING THAT IS NOT CONTROLLED WITH YOUR NAUSEA MEDICATION  *UNUSUAL SHORTNESS OF BREATH  *UNUSUAL BRUISING OR BLEEDING  TENDERNESS IN MOUTH AND THROAT WITH OR WITHOUT PRESENCE OF ULCERS  *URINARY PROBLEMS  *BOWEL PROBLEMS  UNUSUAL RASH Items with * indicate a potential emergency and should be followed up as soon as possible.  Feel free to call the clinic you have any questions or concerns. The clinic phone number is [phone].  Please show the CHEMO ALERT CARD at check-in to the Emergency Department and triage nurse.   

## 2015-09-17 NOTE — Progress Notes (Signed)
[place] [phone]   [phone]  OFFICE PROGRESS NOTE  FRIED, [name], MD [address] [place] [address]  DIAGNOSIS: Stage IV (T2a, N1, M1b) non-small cell lung cancer, adenocarcinoma, negative EGFR mutation but equivocal EGFR amplification, negative ALK gene translocation and negative ROS 1 but with PDL-1 expression 100% presented with left lower lobe lung mass in addition to left hilar adenopathy and solitary metastatic brain lesion diagnosed in December 2016.  PRIOR THERAPY:  1) status post stereotactic radiotherapy and surgical resection diagnosed in December 2016. 2) status post video bronchoscopy with left VATS and wedge resection of the left upper lobe and left lower lobe superior segmentectomy with lymph node dissection under the care of Dr. Servando Snare on 01/14/2015.  CURRENT THERAPY: Immunotherapy with Ketruda (pembrolizumab) 200 MG IV every 3 weeks, first dose 02/18/2015. Status post 11 cycles.  INTERVAL HISTORY: Timothy Obrien 55 y.o. male returns to the clinic today for follow-up visit. The patient is doing fine today with no specific complaints. He continues to tolerate his treatment with immunotherapy with Ketruda (pembrolizumab) with no significant adverse effects. He denied having any skin rash or diarrhea. He has no nausea or vomiting, no fever or chills. He has no significant chest pain, shortness of breath, cough or hemoptysis. He denied having any fever or chills. He is here today to start cycle #12 of his treatment. Unfortunately continues to smoke but will try electronic cigarettes as a way to quit smoking.  MEDICAL HISTORY: Past Medical History:  Diagnosis Date  . Bronchitis   . Chronic fatigue 09/17/2015  . Lung cancer ([facility])    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  . Pneumonia   . Seizures ([facility])    last 12/11/14  . Shortness of breath dyspnea    with exertion  . Tobacco abuse     ALLERGIES:  has No  Known Allergies.  MEDICATIONS:  Current Outpatient Prescriptions  Medication Sig Dispense Refill  . mirtazapine (REMERON) 30 MG tablet TAKE 1 TABLET (30 MG TOTAL) BY MOUTH AT BEDTIME. 90 tablet 0   No current facility-administered medications for this visit.     SURGICAL HISTORY:  Past Surgical History:  Procedure Laterality Date  . APPLICATION OF CRANIAL NAVIGATION N/A 12/26/2014   Procedure: APPLICATION OF CRANIAL NAVIGATION;  Surgeon: Kevan Ny Ditty, MD;  Location: [facility];  Service: Neurosurgery;  Laterality: N/A;  . CRANIOTOMY N/A 12/26/2014   Procedure: CRANIOTOMY TUMOR EXCISION with BrainLab;  Surgeon: Kevan Ny Ditty, MD;  Location: [facility];  Service: Neurosurgery;  Laterality: N/A;  CRANIOTOMY TUMOR EXCISION with Stealth  . HERNIA REPAIR    . VASECTOMY    . VIDEO ASSISTED THORACOSCOPY (VATS)/WEDGE RESECTION Left 01/14/2015   Procedure: VIDEO ASSISTED THORACOSCOPY (VATS)/WEDGE RESECTION, Superior segmentectomy left  lower lobe, wedge resection of left upper lobe, multiple lymph node disection, On Q insertion.;  Surgeon: Grace Isaac, MD;  Location: [facility];  Service: Thoracic;  Laterality: Left;  Marland [facility] VIDEO BRONCHOSCOPY Bilateral 12/16/2014   Procedure: VIDEO BRONCHOSCOPY WITH FLUORO;  Surgeon: Collene Gobble, MD;  Location: [facility];  Service: Cardiopulmonary;  Laterality: Bilateral;  . VIDEO BRONCHOSCOPY N/A 01/14/2015   Procedure: VIDEO BRONCHOSCOPY;  Surgeon: Grace Isaac, MD;  [facility];  Service: Thoracic;  Laterality: N/A;    REVIEW OF SYSTEMS:  A comprehensive review of systems was negative.   PHYSICAL EXAMINATION: General appearance: alert, cooperative, fatigued and  no distress Head: Normocephalic, without obvious abnormality, atraumatic Neck: no adenopathy, no JVD, supple, symmetrical, trachea midline and thyroid not enlarged, symmetric, no tenderness/mass/nodules Lymph nodes: Cervical, supraclavicular, and axillary nodes normal. Resp:  clear to auscultation bilaterally Back: symmetric, no curvature. ROM normal. No CVA tenderness. Cardio: regular rate and rhythm, S1, S2 normal, no murmur, click, rub or gallop GI: soft, non-tender; bowel sounds normal; no masses,  no organomegaly Extremities: extremities normal, atraumatic, no cyanosis or edema Neurologic: Alert and oriented X 3, normal strength and tone. Normal symmetric reflexes. Normal coordination and gait  ECOG PERFORMANCE STATUS: 1 - Symptomatic but completely ambulatory  There were no vitals taken for this visit.  LABORATORY DATA: Lab Results  Component Value Date   WBC 11.0 (H) 08/27/2015   HGB 17.0 08/27/2015   HCT 50.9 (H) 08/27/2015   MCV 96.2 08/27/2015   PLT 309 08/27/2015      Chemistry      Component Value Date/Time   NA 138 08/27/2015 0951   K 4.3 08/27/2015 0951   CL 109 01/16/2015 0450   CO2 20 (L) 08/27/2015 0951   BUN 15.5 08/27/2015 0951   CREATININE 0.9 08/27/2015 0951      Component Value Date/Time   CALCIUM 9.4 08/27/2015 0951   ALKPHOS 118 08/27/2015 0951   AST 26 08/27/2015 0951   ALT 22 08/27/2015 0951   BILITOT 0.39 08/27/2015 0951       RADIOGRAPHIC STUDIES: No results found.  ASSESSMENT AND PLAN: This is a very pleasant 55 years old white male diagnosed with a stage IV non-small cell lung cancer, T2a, N1, M1b) non-small cell lung cancer presented with solitary brain metastasis status post stereotactic radiotherapy followed by craniotomy and resection of the brain tumor. The patient also underwent wedge resection of the left upper lobe as well as left lower lobe superior segmentectomy with lymph node dissection. Unfortunately the left lower lobe resection margin was positive for adenocarcinoma. His molecular study showed equivocal EGFR amplification, negative ALK gene translocation, negative Ros 1 but the immunohistochemical stains showed positive for PDL 1 expression at 100%. He is currently on treatment with Ketruda  (pembrolizumab) status post 11 cycles and tolerated his treatment fairly well. I recommended for the patient to continue his current immunotherapy with [place] (pembrolizumab) and he will proceed with cycle #12 today as scheduled. He would come back for follow-up visit in 3 weeks for reevaluation before starting cycle #13 after repeating CT scan of the chest, abdomen and pelvis for restaging of his disease. For smoke cessation and alcohol abuse, I again strongly encouraged the patient to cut down on the number of beers and cigarette on daily basis. The patient was advised to call if he has any concerning symptoms in the interval. The patient voices understanding of current disease status and treatment options and is in agreement with the current care plan.  All questions were answered. The patient knows to call the clinic with any problems, questions or concerns. We can certainly see the patient much sooner if necessary.  Disclaimer: This note was dictated with voice recognition software. Similar sounding words can inadvertently be transcribed and may not be corrected upon review.

## 2015-10-04 ENCOUNTER — Other Ambulatory Visit: Payer: Self-pay | Admitting: Internal Medicine

## 2015-10-06 ENCOUNTER — Ambulatory Visit (HOSPITAL_COMMUNITY)
Admission: RE | Admit: 2015-10-06 | Discharge: 2015-10-06 | Disposition: A | Discharge status: Home / Self Care | Payer: BLUE CROSS/BLUE SHIELD | Source: Ambulatory Visit | Attending: Internal Medicine | Admitting: Internal Medicine

## 2015-10-06 ENCOUNTER — Encounter (HOSPITAL_COMMUNITY): Payer: Self-pay

## 2015-10-06 DIAGNOSIS — Z5112 Encounter for antineoplastic immunotherapy: Secondary | ICD-10-CM

## 2015-10-06 DIAGNOSIS — C3432 Malignant neoplasm of lower lobe, left bronchus or lung: Secondary | ICD-10-CM | POA: Insufficient documentation

## 2015-10-06 DIAGNOSIS — R918 Other nonspecific abnormal finding of lung field: Secondary | ICD-10-CM | POA: Diagnosis not present

## 2015-10-06 DIAGNOSIS — Z9221 Personal history of antineoplastic chemotherapy: Secondary | ICD-10-CM | POA: Insufficient documentation

## 2015-10-06 DIAGNOSIS — R5382 Chronic fatigue, unspecified: Secondary | ICD-10-CM | POA: Insufficient documentation

## 2015-10-06 DIAGNOSIS — J439 Emphysema, unspecified: Secondary | ICD-10-CM | POA: Insufficient documentation

## 2015-10-06 MED ORDER — IOPAMIDOL (ISOVUE-300) INJECTION 61%
100.0000 mL | Freq: Once | INTRAVENOUS | Status: AC | PRN
  Administered 2015-10-06: 100 mL via INTRAVENOUS

## 2015-10-07 ENCOUNTER — Other Ambulatory Visit: Payer: Self-pay | Admitting: Pharmacist

## 2015-10-07 DIAGNOSIS — C3432 Malignant neoplasm of lower lobe, left bronchus or lung: Secondary | ICD-10-CM

## 2015-10-08 ENCOUNTER — Telehealth: Payer: Self-pay | Admitting: *Deleted

## 2015-10-08 ENCOUNTER — Telehealth: Payer: Self-pay | Admitting: Internal Medicine

## 2015-10-08 ENCOUNTER — Other Ambulatory Visit (HOSPITAL_BASED_OUTPATIENT_CLINIC_OR_DEPARTMENT_OTHER): Payer: BLUE CROSS/BLUE SHIELD

## 2015-10-08 ENCOUNTER — Encounter: Payer: Self-pay | Admitting: Internal Medicine

## 2015-10-08 ENCOUNTER — Ambulatory Visit (HOSPITAL_BASED_OUTPATIENT_CLINIC_OR_DEPARTMENT_OTHER): Payer: BLUE CROSS/BLUE SHIELD | Admitting: Internal Medicine

## 2015-10-08 ENCOUNTER — Ambulatory Visit (HOSPITAL_BASED_OUTPATIENT_CLINIC_OR_DEPARTMENT_OTHER): Payer: BLUE CROSS/BLUE SHIELD

## 2015-10-08 VITALS — BP 141/82 | HR 58 | Temp 97.9°F | Resp 16 | Ht 72.0 in | Wt 171.8 lb

## 2015-10-08 DIAGNOSIS — Z5112 Encounter for antineoplastic immunotherapy: Secondary | ICD-10-CM | POA: Diagnosis not present

## 2015-10-08 DIAGNOSIS — L989 Disorder of the skin and subcutaneous tissue, unspecified: Secondary | ICD-10-CM

## 2015-10-08 DIAGNOSIS — Z79899 Other long term (current) drug therapy: Secondary | ICD-10-CM

## 2015-10-08 DIAGNOSIS — Z720 Tobacco use: Secondary | ICD-10-CM

## 2015-10-08 DIAGNOSIS — F1010 Alcohol abuse, uncomplicated: Secondary | ICD-10-CM

## 2015-10-08 DIAGNOSIS — C3432 Malignant neoplasm of lower lobe, left bronchus or lung: Secondary | ICD-10-CM

## 2015-10-08 LAB — COMPREHENSIVE METABOLIC PANEL
ALK PHOS: 114 U/L (ref 40–150)
ALT: 20 U/L (ref 0–55)
ANION GAP: 10 meq/L (ref 3–11)
AST: 24 U/L (ref 5–34)
Albumin: 3.7 g/dL (ref 3.5–5.0)
BILIRUBIN TOTAL: 0.3 mg/dL (ref 0.20–1.20)
BUN: 14.1 mg/dL (ref 7.0–26.0)
CO2: 18 meq/L — AB (ref 22–29)
Calcium: 9.1 mg/dL (ref 8.4–10.4)
Chloride: 111 mEq/L — ABNORMAL HIGH (ref 98–109)
Creatinine: 0.9 mg/dL (ref 0.7–1.3)
Glucose: 90 mg/dl (ref 70–140)
Potassium: 4.2 mEq/L (ref 3.5–5.1)
Sodium: 139 mEq/L (ref 136–145)
TOTAL PROTEIN: 7.1 g/dL (ref 6.4–8.3)

## 2015-10-08 LAB — CBC WITH DIFFERENTIAL/PLATELET
BASO%: 0.5 % (ref 0.0–2.0)
Basophils Absolute: 0 10*3/uL (ref 0.0–0.1)
EOS ABS: 0.6 10*3/uL — AB (ref 0.0–0.5)
EOS%: 7.3 % — ABNORMAL HIGH (ref 0.0–7.0)
HCT: 46.6 % (ref 38.4–49.9)
HGB: 16.1 g/dL (ref 13.0–17.1)
LYMPH%: 28.5 % (ref 14.0–49.0)
MCH: 33 pg (ref 27.2–33.4)
MCHC: 34.5 g/dL (ref 32.0–36.0)
MCV: 95.5 fL (ref 79.3–98.0)
MONO#: 0.7 10*3/uL (ref 0.1–0.9)
MONO%: 8.9 % (ref 0.0–14.0)
NEUT%: 54.8 % (ref 39.0–75.0)
NEUTROS ABS: 4.5 10*3/uL (ref 1.5–6.5)
PLATELETS: 263 10*3/uL (ref 140–400)
RBC: 4.88 10*6/uL (ref 4.20–5.82)
RDW: 13.4 % (ref 11.0–14.6)
WBC: 8.1 10*3/uL (ref 4.0–10.3)
lymph#: 2.3 10*3/uL (ref 0.9–3.3)

## 2015-10-08 LAB — TSH: TSH: 1.633 m[IU]/L (ref 0.320–4.118)

## 2015-10-08 MED ORDER — SODIUM CHLORIDE 0.9 % IV SOLN
200.0000 mg | Freq: Once | INTRAVENOUS | Status: AC
  Administered 2015-10-08: 200 mg via INTRAVENOUS
  Filled 2015-10-08: qty 8

## 2015-10-08 MED ORDER — SODIUM CHLORIDE 0.9 % IV SOLN
Freq: Once | INTRAVENOUS | Status: AC
  Administered 2015-10-08: 10:00:00 via INTRAVENOUS

## 2015-10-08 NOTE — Telephone Encounter (Signed)
Message sent to chemo scheduler to add chemo per 10/08/15 los.

## 2015-10-08 NOTE — Progress Notes (Signed)
[place] [phone]   [phone]  OFFICE PROGRESS NOTE  Obrien, Timothy Graff, MD [address] [place] [address]  DIAGNOSIS: Stage IV (T2a, N1, M1b) non-small cell lung cancer, adenocarcinoma, negative EGFR mutation but equivocal EGFR amplification, negative ALK gene translocation and negative ROS 1 but with PDL-1 expression 100% presented with left lower lobe lung mass in addition to left hilar adenopathy and solitary metastatic brain lesion diagnosed in December 2016.  PRIOR THERAPY:  1) status post stereotactic radiotherapy and surgical resection diagnosed in December 2016. 2) status post video bronchoscopy with left VATS and wedge resection of the left upper lobe and left lower lobe superior segmentectomy with lymph node dissection under the care of Dr. Servando Snare on 01/14/2015.  CURRENT THERAPY: Immunotherapy with Ketruda (pembrolizumab) 200 MG IV every 3 weeks, first dose 02/18/2015. Status post 12 cycles.  INTERVAL HISTORY: Timothy Obrien 55 y.o. male returns to the clinic today for follow-up visit. The patient is doing fine today with no specific complaints. He continues to tolerate his treatment with immunotherapy with Ketruda (pembrolizumab) with no significant adverse effects. He has few spots in his skin and he would like referral to dermatology for evaluation. He denied having any skin rash or diarrhea. He has no nausea or vomiting, no fever or chills. He has no significant chest pain, shortness of breath, cough or hemoptysis. He denied having any fever or chills. He had repeat CT scan of the chest, abdomen and pelvis performed recently and he is here for evaluation and discussion of his scan results.  MEDICAL HISTORY: Past Medical History:  Diagnosis Date  . Bronchitis   . Chronic fatigue 09/17/2015  . Lung cancer ([place])    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  . Pneumonia   . Seizures ([place])    last 12/11/14  . Shortness of  breath dyspnea    with exertion  . Tobacco abuse     ALLERGIES:  has No Known Allergies.  MEDICATIONS:  Current Outpatient Prescriptions  Medication Sig Dispense Refill  . mirtazapine (REMERON) 30 MG tablet TAKE 1 TABLET (30 MG TOTAL) BY MOUTH AT BEDTIME. 90 tablet 0   No current facility-administered medications for this visit.     SURGICAL HISTORY:  Past Surgical History:  Procedure Laterality Date  . APPLICATION OF CRANIAL NAVIGATION N/A 12/26/2014   Procedure: APPLICATION OF CRANIAL NAVIGATION;  Surgeon: Kevan Ny Ditty, MD;  Location: [facility];  Service: Neurosurgery;  Laterality: N/A;  . CRANIOTOMY N/A 12/26/2014   Procedure: CRANIOTOMY TUMOR EXCISION with BrainLab;  Surgeon: Kevan Ny Ditty, MD;  Location: [facility];  Service: Neurosurgery;  Laterality: N/A;  CRANIOTOMY TUMOR EXCISION with [facility]  . HERNIA REPAIR    . VASECTOMY    . VIDEO ASSISTED THORACOSCOPY (VATS)/WEDGE RESECTION Left 01/14/2015   Procedure: VIDEO ASSISTED THORACOSCOPY (VATS)/WEDGE RESECTION, Superior segmentectomy left  lower lobe, wedge resection of left upper lobe, multiple lymph node disection, On Q insertion.;  Surgeon: Grace Isaac, MD;  Location: [facility];  Service: Thoracic;  Laterality: Left;  Marland Kitchen VIDEO BRONCHOSCOPY Bilateral 12/16/2014   Procedure: VIDEO BRONCHOSCOPY WITH FLUORO;  Surgeon: Collene Gobble, MD;  Location: [facility];  Service: Cardiopulmonary;  Laterality: Bilateral;  . VIDEO BRONCHOSCOPY N/A 01/14/2015   Procedure: VIDEO BRONCHOSCOPY;  Surgeon: Grace Isaac, MD;  Location: [facility] OR;  Service: Thoracic;  Laterality: N/A;    REVIEW OF SYSTEMS:  Constitutional: negative  Eyes: negative Ears, nose, mouth, throat, and face: negative Respiratory: negative Cardiovascular: negative Gastrointestinal: negative Genitourinary:negative Integument/breast: negative Hematologic/lymphatic: negative Musculoskeletal:negative Neurological: negative Behavioral/Psych:  negative Endocrine: negative Allergic/Immunologic: negative   PHYSICAL EXAMINATION: General appearance: alert, cooperative, fatigued and no distress Head: Normocephalic, without obvious abnormality, atraumatic Neck: no adenopathy, no JVD, supple, symmetrical, trachea midline and thyroid not enlarged, symmetric, no tenderness/mass/nodules Lymph nodes: Cervical, supraclavicular, and axillary nodes normal. Resp: clear to auscultation bilaterally Back: symmetric, no curvature. ROM normal. No CVA tenderness. Cardio: regular rate and rhythm, S1, S2 normal, no murmur, click, rub or gallop GI: soft, non-tender; bowel sounds normal; no masses,  no organomegaly Extremities: extremities normal, atraumatic, no cyanosis or edema Neurologic: Alert and oriented X 3, normal strength and tone. Normal symmetric reflexes. Normal coordination and gait  ECOG PERFORMANCE STATUS: 1 - Symptomatic but completely ambulatory  Blood pressure (!) 141/82, pulse (!) 58, temperature 97.9 F (36.6 C), temperature source Oral, resp. rate 16, height 6' (1.829 m), weight 171 lb 12.8 oz (77.9 kg), SpO2 100 %.  LABORATORY DATA: Lab Results  Component Value Date   WBC 8.1 10/08/2015   HGB 16.1 10/08/2015   HCT 46.6 10/08/2015   MCV 95.5 10/08/2015   PLT 263 10/08/2015      Chemistry      Component Value Date/Time   NA 140 09/17/2015 0856   K 4.2 09/17/2015 0856   CL 109 01/16/2015 0450   CO2 18 (L) 09/17/2015 0856   BUN 15.2 09/17/2015 0856   CREATININE 0.9 09/17/2015 0856      Component Value Date/Time   CALCIUM 9.2 09/17/2015 0856   ALKPHOS 109 09/17/2015 0856   AST 20 09/17/2015 0856   ALT 17 09/17/2015 0856   BILITOT <0.30 09/17/2015 0856       RADIOGRAPHIC STUDIES: Ct Chest W Contrast  Result Date: 10/06/2015 CLINICAL DATA:  Metastatic lung cancer. EXAM: CT CHEST, ABDOMEN, AND PELVIS WITH CONTRAST TECHNIQUE: Multidetector CT imaging of the chest, abdomen and pelvis was performed following the  standard protocol during bolus administration of intravenous contrast. CONTRAST:  183m ISOVUE-300 IOPAMIDOL (ISOVUE-300) INJECTION 61% COMPARISON:  06/23/2015 FINDINGS: CT CHEST FINDINGS Chest wall: No chest wall mass, supraclavicular or axillary lymphadenopathy. Small scattered lymph nodes are noted. The thyroid gland appears normal. Cardiovascular: The heart is normal in size. No pericardial effusion. The aorta is normal caliber. No dissection. No atherosclerotic calcifications. Scattered coronary artery calcifications. Mediastinum/Nodes: Small scattered mediastinal and hilar lymph nodes are stable. No mass or overt adenopathy. The esophagus is grossly normal. Lungs/Pleura: Stable emphysematous changes. The vague right upper lobe nodular density adjacent to a bulla on image number 34 is unchanged. Stable surgical changes involving the left infrahilar region and left lower lobe. Stable 14 x 13 mm nodular density adjacent to the suture line in the left lower lobe. This is likely postoperative change but needs surveillance. Stable 7 mm nodular density at the left lung base on image number 141. No new pulmonary lesions or acute overlying pulmonary process. There is dependent subpleural atelectasis bilaterally. No pleural effusions. Musculoskeletal: No significant bony findings. CT ABDOMEN PELVIS FINDINGS Hepatobiliary: No focal hepatic lesions or intrahepatic biliary dilatation. Tiny low-attenuation lesion in segment 6 is unchanged. The gallbladder is normal. No common bile duct dilatation. Pancreas: No mass, inflammation or ductal dilatation. Spleen: Normal size.  No focal lesions. Adrenals/Urinary Tract: The adrenal glands and kidneys are unremarkable and stable. Small cysts. No hydronephrosis. Stomach/Bowel: The stomach, duodenum, small bowel and colon are unremarkable. No  inflammatory changes, mass lesions or obstructive findings. The terminal ileum is normal. The appendix is normal. Moderate stool throughout the  colon and down into the rectum may suggest constipation. Vascular/Lymphatic: Moderate atherosclerotic calcifications involving the aorta but no aneurysm or dissection. The branch vessels are patent. The major venous structures are patent. Small scattered mesenteric and retroperitoneal lymph nodes but no mass or adenopathy. Reproductive: The prostate gland and seminal vesicles are unremarkable. Other: No pelvic mass or adenopathy. No free pelvic fluid collections. The bladder is normal. No inguinal mass or adenopathy. Small left inguinal hernia containing fat. Musculoskeletal: No significant bony findings. IMPRESSION: 1. Stable vague nodular density in the right upper lobe, likely scar adjacent to a bulla. 2. Stable left lower lobe nodular density adjacent to a suture line. This is likely scar tissue. Recommend continued surveillance. 3. Stable 7 mm left lower lobe nodule at the lung base. 4. Stable emphysematous changes. 5. No findings for metastatic disease involving the abdomen/pelvis. Electronically Signed   By: Marijo Sanes M.D.   On: 10/06/2015 09:24   Ct Abdomen Pelvis W Contrast  Result Date: 10/06/2015 CLINICAL DATA:  Metastatic lung cancer. EXAM: CT CHEST, ABDOMEN, AND PELVIS WITH CONTRAST TECHNIQUE: Multidetector CT imaging of the chest, abdomen and pelvis was performed following the standard protocol during bolus administration of intravenous contrast. CONTRAST:  118m ISOVUE-300 IOPAMIDOL (ISOVUE-300) INJECTION 61% COMPARISON:  06/23/2015 FINDINGS: CT CHEST FINDINGS Chest wall: No chest wall mass, supraclavicular or axillary lymphadenopathy. Small scattered lymph nodes are noted. The thyroid gland appears normal. Cardiovascular: The heart is normal in size. No pericardial effusion. The aorta is normal caliber. No dissection. No atherosclerotic calcifications. Scattered coronary artery calcifications. Mediastinum/Nodes: Small scattered mediastinal and hilar lymph nodes are stable. No mass or overt  adenopathy. The esophagus is grossly normal. Lungs/Pleura: Stable emphysematous changes. The vague right upper lobe nodular density adjacent to a bulla on image number 34 is unchanged. Stable surgical changes involving the left infrahilar region and left lower lobe. Stable 14 x 13 mm nodular density adjacent to the suture line in the left lower lobe. This is likely postoperative change but needs surveillance. Stable 7 mm nodular density at the left lung base on image number 141. No new pulmonary lesions or acute overlying pulmonary process. There is dependent subpleural atelectasis bilaterally. No pleural effusions. Musculoskeletal: No significant bony findings. CT ABDOMEN PELVIS FINDINGS Hepatobiliary: No focal hepatic lesions or intrahepatic biliary dilatation. Tiny low-attenuation lesion in segment 6 is unchanged. The gallbladder is normal. No common bile duct dilatation. Pancreas: No mass, inflammation or ductal dilatation. Spleen: Normal size.  No focal lesions. Adrenals/Urinary Tract: The adrenal glands and kidneys are unremarkable and stable. Small cysts. No hydronephrosis. Stomach/Bowel: The stomach, duodenum, small bowel and colon are unremarkable. No inflammatory changes, mass lesions or obstructive findings. The terminal ileum is normal. The appendix is normal. Moderate stool throughout the colon and down into the rectum may suggest constipation. Vascular/Lymphatic: Moderate atherosclerotic calcifications involving the aorta but no aneurysm or dissection. The branch vessels are patent. The major venous structures are patent. Small scattered mesenteric and retroperitoneal lymph nodes but no mass or adenopathy. Reproductive: The prostate gland and seminal vesicles are unremarkable. Other: No pelvic mass or adenopathy. No free pelvic fluid collections. The bladder is normal. No inguinal mass or adenopathy. Small left inguinal hernia containing fat. Musculoskeletal: No significant bony findings. IMPRESSION:  1. Stable vague nodular density in the right upper lobe, likely scar adjacent to a bulla. 2. Stable left lower  lobe nodular density adjacent to a suture line. This is likely scar tissue. Recommend continued surveillance. 3. Stable 7 mm left lower lobe nodule at the lung base. 4. Stable emphysematous changes. 5. No findings for metastatic disease involving the abdomen/pelvis. Electronically Signed   By: Marijo Sanes M.D.   On: 10/06/2015 09:24    ASSESSMENT AND PLAN: This is a very pleasant 55 years old white male diagnosed with a stage IV non-small cell lung cancer, T2a, N1, M1b) non-small cell lung cancer presented with solitary brain metastasis status post stereotactic radiotherapy followed by craniotomy and resection of the brain tumor. The patient also underwent wedge resection of the left upper lobe as well as left lower lobe superior segmentectomy with lymph node dissection. Unfortunately the left lower lobe resection margin was positive for adenocarcinoma. His molecular study showed equivocal EGFR amplification, negative ALK gene translocation, negative Ros 1 but the immunohistochemical stains showed positive for PDL 1 expression at 100%. He is currently on treatment with Ketruda (pembrolizumab) status post 12 cycles and tolerated his treatment fairly well. The recent CT scan of the chest, abdomen and pelvis showed no evidence for disease progression. I discussed the scan results with the patient today. I recommended for the patient to continue his current immunotherapy with [place] (pembrolizumab) and he will proceed with cycle #13 today as scheduled. He would come back for follow-up visit in 3 weeks for reevaluation before starting cycle #14.  For the skin lesion, I will refer him to dermatology for evaluation. For smoke cessation and alcohol abuse, I again strongly encouraged the patient to cut down on the number of beers and cigarette on daily basis. The patient was advised to call if he has  any concerning symptoms in the interval. The patient voices understanding of current disease status and treatment options and is in agreement with the current care plan.  All questions were answered. The patient knows to call the clinic with any problems, questions or concerns. We can certainly see the patient much sooner if necessary.  Disclaimer: This note was dictated with voice recognition software. Similar sounding words can inadvertently be transcribed and may not be corrected upon review.

## 2015-10-08 NOTE — Patient Instructions (Signed)
[facility] Discharge Instructions for Patients Receiving Chemotherapy  Today you received the following chemotherapy agents: Keytruda   To help prevent nausea and vomiting after your treatment, we encourage you to take your nausea medication as directed    If you develop nausea and vomiting that is not controlled by your nausea medication, call the clinic.   BELOW ARE SYMPTOMS THAT SHOULD BE REPORTED IMMEDIATELY:  *FEVER GREATER THAN 100.5 F  *CHILLS WITH OR WITHOUT FEVER  NAUSEA AND VOMITING THAT IS NOT CONTROLLED WITH YOUR NAUSEA MEDICATION  *UNUSUAL SHORTNESS OF BREATH  *UNUSUAL BRUISING OR BLEEDING  TENDERNESS IN MOUTH AND THROAT WITH OR WITHOUT PRESENCE OF ULCERS  *URINARY PROBLEMS  *BOWEL PROBLEMS  UNUSUAL RASH Items with * indicate a potential emergency and should be followed up as soon as possible.  Feel free to call the clinic you have any questions or concerns. The clinic phone number is [phone].  Please show the CHEMO ALERT CARD at check-in to the Emergency Department and triage nurse.   

## 2015-10-08 NOTE — Telephone Encounter (Signed)
Avs report and appointment schedule given to patient, per 10/08/15 los. °

## 2015-10-08 NOTE — Telephone Encounter (Signed)
Per LOS I have scheduled appts and notified the scheduler 

## 2015-10-13 ENCOUNTER — Other Ambulatory Visit: Payer: Self-pay | Admitting: Radiation Therapy

## 2015-10-13 DIAGNOSIS — C7931 Secondary malignant neoplasm of brain: Secondary | ICD-10-CM

## 2015-10-13 DIAGNOSIS — C7949 Secondary malignant neoplasm of other parts of nervous system: Principal | ICD-10-CM

## 2015-10-26 ENCOUNTER — Inpatient Hospital Stay: Admission: RE | Admit: 2015-10-26 | Payer: BLUE CROSS/BLUE SHIELD | Source: Ambulatory Visit

## 2015-10-29 ENCOUNTER — Other Ambulatory Visit (HOSPITAL_BASED_OUTPATIENT_CLINIC_OR_DEPARTMENT_OTHER): Payer: BLUE CROSS/BLUE SHIELD

## 2015-10-29 ENCOUNTER — Encounter: Payer: Self-pay | Admitting: Internal Medicine

## 2015-10-29 ENCOUNTER — Telehealth: Payer: Self-pay | Admitting: Internal Medicine

## 2015-10-29 ENCOUNTER — Ambulatory Visit: Payer: BLUE CROSS/BLUE SHIELD | Admitting: Radiation Oncology

## 2015-10-29 ENCOUNTER — Ambulatory Visit (HOSPITAL_BASED_OUTPATIENT_CLINIC_OR_DEPARTMENT_OTHER): Payer: BLUE CROSS/BLUE SHIELD

## 2015-10-29 ENCOUNTER — Telehealth: Payer: Self-pay | Admitting: *Deleted

## 2015-10-29 ENCOUNTER — Ambulatory Visit (HOSPITAL_BASED_OUTPATIENT_CLINIC_OR_DEPARTMENT_OTHER): Payer: BLUE CROSS/BLUE SHIELD | Admitting: Internal Medicine

## 2015-10-29 VITALS — BP 131/80 | HR 63 | Temp 97.9°F | Resp 17 | Wt 173.7 lb

## 2015-10-29 DIAGNOSIS — C7931 Secondary malignant neoplasm of brain: Secondary | ICD-10-CM

## 2015-10-29 DIAGNOSIS — C3432 Malignant neoplasm of lower lobe, left bronchus or lung: Secondary | ICD-10-CM

## 2015-10-29 DIAGNOSIS — Z23 Encounter for immunization: Secondary | ICD-10-CM

## 2015-10-29 DIAGNOSIS — Z5112 Encounter for antineoplastic immunotherapy: Secondary | ICD-10-CM | POA: Diagnosis not present

## 2015-10-29 DIAGNOSIS — Z720 Tobacco use: Secondary | ICD-10-CM

## 2015-10-29 DIAGNOSIS — R531 Weakness: Secondary | ICD-10-CM

## 2015-10-29 DIAGNOSIS — Z9889 Other specified postprocedural states: Secondary | ICD-10-CM

## 2015-10-29 DIAGNOSIS — E440 Moderate protein-calorie malnutrition: Secondary | ICD-10-CM

## 2015-10-29 LAB — CBC WITH DIFFERENTIAL/PLATELET
BASO%: 1.5 % (ref 0.0–2.0)
BASOS ABS: 0.1 10*3/uL (ref 0.0–0.1)
EOS%: 4.5 % (ref 0.0–7.0)
Eosinophils Absolute: 0.4 10*3/uL (ref 0.0–0.5)
HEMATOCRIT: 45.2 % (ref 38.4–49.9)
HEMOGLOBIN: 15.2 g/dL (ref 13.0–17.1)
LYMPH#: 2.5 10*3/uL (ref 0.9–3.3)
LYMPH%: 29 % (ref 14.0–49.0)
MCH: 32 pg (ref 27.2–33.4)
MCHC: 33.6 g/dL (ref 32.0–36.0)
MCV: 95.2 fL (ref 79.3–98.0)
MONO#: 0.8 10*3/uL (ref 0.1–0.9)
MONO%: 9.1 % (ref 0.0–14.0)
NEUT%: 55.9 % (ref 39.0–75.0)
NEUTROS ABS: 4.8 10*3/uL (ref 1.5–6.5)
Platelets: 274 10*3/uL (ref 140–400)
RBC: 4.75 10*6/uL (ref 4.20–5.82)
RDW: 13.1 % (ref 11.0–14.6)
WBC: 8.6 10*3/uL (ref 4.0–10.3)

## 2015-10-29 LAB — COMPREHENSIVE METABOLIC PANEL
ALBUMIN: 3.5 g/dL (ref 3.5–5.0)
ALK PHOS: 110 U/L (ref 40–150)
ALT: 22 U/L (ref 0–55)
AST: 23 U/L (ref 5–34)
Anion Gap: 9 mEq/L (ref 3–11)
BUN: 20.8 mg/dL (ref 7.0–26.0)
CALCIUM: 8.7 mg/dL (ref 8.4–10.4)
CO2: 18 mEq/L — ABNORMAL LOW (ref 22–29)
CREATININE: 1.1 mg/dL (ref 0.7–1.3)
Chloride: 113 mEq/L — ABNORMAL HIGH (ref 98–109)
EGFR: 77 mL/min/{1.73_m2} — ABNORMAL LOW (ref >90)
GLUCOSE: 98 mg/dL (ref 70–140)
Potassium: 4.4 mEq/L (ref 3.5–5.1)
SODIUM: 140 meq/L (ref 136–145)
TOTAL PROTEIN: 6.7 g/dL (ref 6.4–8.3)
Total Bilirubin: 0.22 mg/dL (ref 0.20–1.20)

## 2015-10-29 MED ORDER — SODIUM CHLORIDE 0.9 % IV SOLN
200.0000 mg | Freq: Once | INTRAVENOUS | Status: AC
  Administered 2015-10-29: 200 mg via INTRAVENOUS
  Filled 2015-10-29: qty 8

## 2015-10-29 MED ORDER — INFLUENZA VAC SPLIT QUAD 0.5 ML IM SUSY
0.5000 mL | PREFILLED_SYRINGE | Freq: Once | INTRAMUSCULAR | Status: AC
  Administered 2015-10-29: 0.5 mL via INTRAMUSCULAR
  Filled 2015-10-29: qty 0.5

## 2015-10-29 MED ORDER — SODIUM CHLORIDE 0.9 % IV SOLN
Freq: Once | INTRAVENOUS | Status: AC
  Administered 2015-10-29: 10:00:00 via INTRAVENOUS

## 2015-10-29 NOTE — Telephone Encounter (Signed)
Message sent to chemo scheduler to add chemo. AVS report and appointment schedule given to patient, per 10/29/15 los.

## 2015-10-29 NOTE — Patient Instructions (Signed)
[facility] Discharge Instructions for Patients Receiving Chemotherapy  Today you received the following chemotherapy agents: Keytruda   To help prevent nausea and vomiting after your treatment, we encourage you to take your nausea medication as directed    If you develop nausea and vomiting that is not controlled by your nausea medication, call the clinic.   BELOW ARE SYMPTOMS THAT SHOULD BE REPORTED IMMEDIATELY:  *FEVER GREATER THAN 100.5 F  *CHILLS WITH OR WITHOUT FEVER  NAUSEA AND VOMITING THAT IS NOT CONTROLLED WITH YOUR NAUSEA MEDICATION  *UNUSUAL SHORTNESS OF BREATH  *UNUSUAL BRUISING OR BLEEDING  TENDERNESS IN MOUTH AND THROAT WITH OR WITHOUT PRESENCE OF ULCERS  *URINARY PROBLEMS  *BOWEL PROBLEMS  UNUSUAL RASH Items with * indicate a potential emergency and should be followed up as soon as possible.  Feel free to call the clinic you have any questions or concerns. The clinic phone number is [phone].  Please show the CHEMO ALERT CARD at check-in to the Emergency Department and triage nurse.   

## 2015-10-29 NOTE — Telephone Encounter (Signed)
Per LOS I have scheduled appt and notified the scheduler

## 2015-10-29 NOTE — Progress Notes (Signed)
[place] [phone]) [phone]   [phone]  OFFICE PROGRESS NOTE  FRIED, [name], MD [address] [place] [address]  DIAGNOSIS: Stage IV (T2a, N1, M1b) non-small cell lung cancer, adenocarcinoma, negative EGFR mutation but equivocal EGFR amplification, negative ALK gene translocation and negative ROS 1 but with PDL-1 expression 100% presented with left lower lobe lung mass in addition to left hilar adenopathy and solitary metastatic brain lesion diagnosed in December 2016.  PRIOR THERAPY:  1) status post stereotactic radiotherapy and surgical resection diagnosed in December 2016. 2) status post video bronchoscopy with left VATS and wedge resection of the left upper lobe and left lower lobe superior segmentectomy with lymph node dissection under the care of Dr. Servando Snare on 01/14/2015.  CURRENT THERAPY: Immunotherapy with Ketruda (pembrolizumab) 200 MG IV every 3 weeks, first dose 02/18/2015. Status post 13 cycles.  INTERVAL HISTORY: Timothy Obrien 55 y.o. male returns to the clinic today for follow-up visit. The patient is doing fine today with no specific complaints. He tolerated the last cycle of his treatment with immunotherapy fairly well with no significant adverse effects. He denied having any skin rash or diarrhea. He has no nausea or vomiting, no fever or chills. He has no significant chest pain, shortness of breath, cough or hemoptysis. He denied having any fever or chills. He is here today to start cycle #14.  MEDICAL HISTORY: Past Medical History:  Diagnosis Date  . Bronchitis   . Chronic fatigue 09/17/2015  . Lung cancer ([place])    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  . Pneumonia   . Seizures ([place])    last 12/11/14  . Shortness of breath dyspnea    with exertion  . Tobacco abuse     ALLERGIES:  has No Known Allergies.  MEDICATIONS:  Current Outpatient Prescriptions  Medication Sig Dispense Refill  . mirtazapine  (REMERON) 30 MG tablet TAKE 1 TABLET (30 MG TOTAL) BY MOUTH AT BEDTIME. 90 tablet 0   No current facility-administered medications for this visit.     SURGICAL HISTORY:  Past Surgical History:  Procedure Laterality Date  . APPLICATION OF CRANIAL NAVIGATION N/A 12/26/2014   Procedure: APPLICATION OF CRANIAL NAVIGATION;  Surgeon: Kevan Ny Ditty, MD;  Location: [place] [facility];  Service: Neurosurgery;  Laterality: N/A;  . CRANIOTOMY N/A 12/26/2014   Procedure: CRANIOTOMY TUMOR EXCISION with BrainLab;  Surgeon: Kevan Ny Ditty, MD;  Location: [facility] [facility]UR[facility] ORS;  Service: Neurosurgery;  Laterality: N/A;  CRANIOTOMY TUMOR EXCISION with Stealth  . HERNIA REPAIR    . VASECTOMY    . VIDEO ASSISTED THORACOSCOPY (VATS)/WEDGE RESECTION Left 01/14/2015   Procedure: VIDEO ASSISTED THORACOSCOPY (VATS)/WEDGE RESECTION, Superior segmentectomy left  lower lobe, wedge resection of left upper lobe, multiple lymph node disection, On Q insertion.;  Surgeon: Grace Isaac, MD;  Location: [facility];  Service: Thoracic;  Laterality: Left;  Marland Kitchen VIDEO BRONCHOSCOPY Bilateral 12/16/2014   Procedure: VIDEO BRONCHOSCOPY WITH FLUORO;  Surgeon: Collene Gobble, MD;  Location: [facility];  Service: Cardiopulmonary;  Laterality: Bilateral;  . VIDEO BRONCHOSCOPY N/A 01/14/2015   Procedure: VIDEO BRONCHOSCOPY;  Surgeon: Grace Isaac, MD;  [facility];  Service: Thoracic;  Laterality: N/A;    REVIEW OF SYSTEMS:  A comprehensive review of systems was negative.   PHYSICAL EXAMINATION: General appearance: alert, cooperative, fatigued and no distress Head: Normocephalic, without obvious abnormality, atraumatic Neck: no adenopathy, no JVD, supple, symmetrical, trachea midline  and thyroid not enlarged, symmetric, no tenderness/mass/nodules Lymph nodes: Cervical, supraclavicular, and axillary nodes normal. Resp: clear to auscultation bilaterally Back: symmetric, no curvature. ROM normal. No CVA tenderness. Cardio: regular  rate and rhythm, S1, S2 normal, no murmur, click, rub or gallop GI: soft, non-tender; bowel sounds normal; no masses,  no organomegaly Extremities: extremities normal, atraumatic, no cyanosis or edema Neurologic: Alert and oriented X 3, normal strength and tone. Normal symmetric reflexes. Normal coordination and gait  ECOG PERFORMANCE STATUS: 1 - Symptomatic but completely ambulatory  Blood pressure 131/80, pulse 63, temperature 97.9 F (36.6 C), temperature source Oral, resp. rate 17, weight 173 lb 11.2 oz (78.8 kg), SpO2 100 %.  LABORATORY DATA: Lab Results  Component Value Date   WBC 8.6 10/29/2015   HGB 15.2 10/29/2015   HCT 45.2 10/29/2015   MCV 95.2 10/29/2015   PLT 274 10/29/2015      Chemistry      Component Value Date/Time   NA 139 10/08/2015 0814   K 4.2 10/08/2015 0814   CL 109 01/16/2015 0450   CO2 18 (L) 10/08/2015 0814   BUN 14.1 10/08/2015 0814   CREATININE 0.9 10/08/2015 0814      Component Value Date/Time   CALCIUM 9.1 10/08/2015 0814   ALKPHOS 114 10/08/2015 0814   AST 24 10/08/2015 0814   ALT 20 10/08/2015 0814   BILITOT 0.30 10/08/2015 0814       RADIOGRAPHIC STUDIES: Ct Chest W Contrast  Result Date: 10/06/2015 CLINICAL DATA:  Metastatic lung cancer. EXAM: CT CHEST, ABDOMEN, AND PELVIS WITH CONTRAST TECHNIQUE: Multidetector CT imaging of the chest, abdomen and pelvis was performed following the standard protocol during bolus administration of intravenous contrast. CONTRAST:  143m ISOVUE-300 IOPAMIDOL (ISOVUE-300) INJECTION 61% COMPARISON:  06/23/2015 FINDINGS: CT CHEST FINDINGS Chest wall: No chest wall mass, supraclavicular or axillary lymphadenopathy. Small scattered lymph nodes are noted. The thyroid gland appears normal. Cardiovascular: The heart is normal in size. No pericardial effusion. The aorta is normal caliber. No dissection. No atherosclerotic calcifications. Scattered coronary artery calcifications. Mediastinum/Nodes: Small scattered  mediastinal and hilar lymph nodes are stable. No mass or overt adenopathy. The esophagus is grossly normal. Lungs/Pleura: Stable emphysematous changes. The vague right upper lobe nodular density adjacent to a bulla on image number 34 is unchanged. Stable surgical changes involving the left infrahilar region and left lower lobe. Stable 14 x 13 mm nodular density adjacent to the suture line in the left lower lobe. This is likely postoperative change but needs surveillance. Stable 7 mm nodular density at the left lung base on image number 141. No new pulmonary lesions or acute overlying pulmonary process. There is dependent subpleural atelectasis bilaterally. No pleural effusions. Musculoskeletal: No significant bony findings. CT ABDOMEN PELVIS FINDINGS Hepatobiliary: No focal hepatic lesions or intrahepatic biliary dilatation. Tiny low-attenuation lesion in segment 6 is unchanged. The gallbladder is normal. No common bile duct dilatation. Pancreas: No mass, inflammation or ductal dilatation. Spleen: Normal size.  No focal lesions. Adrenals/Urinary Tract: The adrenal glands and kidneys are unremarkable and stable. Small cysts. No hydronephrosis. Stomach/Bowel: The stomach, duodenum, small bowel and colon are unremarkable. No inflammatory changes, mass lesions or obstructive findings. The terminal ileum is normal. The appendix is normal. Moderate stool throughout the colon and down into the rectum may suggest constipation. Vascular/Lymphatic: Moderate atherosclerotic calcifications involving the aorta but no aneurysm or dissection. The branch vessels are patent. The major venous structures are patent. Small scattered mesenteric and retroperitoneal lymph nodes but no mass  or adenopathy. Reproductive: The prostate gland and seminal vesicles are unremarkable. Other: No pelvic mass or adenopathy. No free pelvic fluid collections. The bladder is normal. No inguinal mass or adenopathy. Small left inguinal hernia containing  fat. Musculoskeletal: No significant bony findings. IMPRESSION: 1. Stable vague nodular density in the right upper lobe, likely scar adjacent to a bulla. 2. Stable left lower lobe nodular density adjacent to a suture line. This is likely scar tissue. Recommend continued surveillance. 3. Stable 7 mm left lower lobe nodule at the lung base. 4. Stable emphysematous changes. 5. No findings for metastatic disease involving the abdomen/pelvis. Electronically Signed   By: Marijo Sanes M.D.   On: 10/06/2015 09:24   Ct Abdomen Pelvis W Contrast  Result Date: 10/06/2015 CLINICAL DATA:  Metastatic lung cancer. EXAM: CT CHEST, ABDOMEN, AND PELVIS WITH CONTRAST TECHNIQUE: Multidetector CT imaging of the chest, abdomen and pelvis was performed following the standard protocol during bolus administration of intravenous contrast. CONTRAST:  162m ISOVUE-300 IOPAMIDOL (ISOVUE-300) INJECTION 61% COMPARISON:  06/23/2015 FINDINGS: CT CHEST FINDINGS Chest wall: No chest wall mass, supraclavicular or axillary lymphadenopathy. Small scattered lymph nodes are noted. The thyroid gland appears normal. Cardiovascular: The heart is normal in size. No pericardial effusion. The aorta is normal caliber. No dissection. No atherosclerotic calcifications. Scattered coronary artery calcifications. Mediastinum/Nodes: Small scattered mediastinal and hilar lymph nodes are stable. No mass or overt adenopathy. The esophagus is grossly normal. Lungs/Pleura: Stable emphysematous changes. The vague right upper lobe nodular density adjacent to a bulla on image number 34 is unchanged. Stable surgical changes involving the left infrahilar region and left lower lobe. Stable 14 x 13 mm nodular density adjacent to the suture line in the left lower lobe. This is likely postoperative change but needs surveillance. Stable 7 mm nodular density at the left lung base on image number 141. No new pulmonary lesions or acute overlying pulmonary process. There is  dependent subpleural atelectasis bilaterally. No pleural effusions. Musculoskeletal: No significant bony findings. CT ABDOMEN PELVIS FINDINGS Hepatobiliary: No focal hepatic lesions or intrahepatic biliary dilatation. Tiny low-attenuation lesion in segment 6 is unchanged. The gallbladder is normal. No common bile duct dilatation. Pancreas: No mass, inflammation or ductal dilatation. Spleen: Normal size.  No focal lesions. Adrenals/Urinary Tract: The adrenal glands and kidneys are unremarkable and stable. Small cysts. No hydronephrosis. Stomach/Bowel: The stomach, duodenum, small bowel and colon are unremarkable. No inflammatory changes, mass lesions or obstructive findings. The terminal ileum is normal. The appendix is normal. Moderate stool throughout the colon and down into the rectum may suggest constipation. Vascular/Lymphatic: Moderate atherosclerotic calcifications involving the aorta but no aneurysm or dissection. The branch vessels are patent. The major venous structures are patent. Small scattered mesenteric and retroperitoneal lymph nodes but no mass or adenopathy. Reproductive: The prostate gland and seminal vesicles are unremarkable. Other: No pelvic mass or adenopathy. No free pelvic fluid collections. The bladder is normal. No inguinal mass or adenopathy. Small left inguinal hernia containing fat. Musculoskeletal: No significant bony findings. IMPRESSION: 1. Stable vague nodular density in the right upper lobe, likely scar adjacent to a bulla. 2. Stable left lower lobe nodular density adjacent to a suture line. This is likely scar tissue. Recommend continued surveillance. 3. Stable 7 mm left lower lobe nodule at the lung base. 4. Stable emphysematous changes. 5. No findings for metastatic disease involving the abdomen/pelvis. Electronically Signed   By: PMarijo SanesM.D.   On: 10/06/2015 09:24    ASSESSMENT AND PLAN: This is  a very pleasant 55 years old white male diagnosed with a stage IV  non-small cell lung cancer, T2a, N1, M1b) non-small cell lung cancer presented with solitary brain metastasis status post stereotactic radiotherapy followed by craniotomy and resection of the brain tumor. The patient also underwent wedge resection of the left upper lobe as well as left lower lobe superior segmentectomy with lymph node dissection. Unfortunately the left lower lobe resection margin was positive for adenocarcinoma. His molecular study showed equivocal EGFR amplification, negative ALK gene translocation, negative ROS 1 but the immunohistochemical stains showed positive for PDL 1 expression at 100%. He is currently on treatment with Ketruda (pembrolizumab) status post 13 cycles and tolerated his treatment fairly well. I recommended for the patient to continue his current immunotherapy with [place] (pembrolizumab) and he will proceed with cycle #14 today as scheduled. He would come back for follow-up visit in 3 weeks for reevaluation before starting cycle #15.  He will receive flu vaccine today. For smoke cessation, quit smoking 3 weeks ago but he is currently doing electronic cigarette. I also advised him to quit this one gradually. The patient was advised to call if he has any concerning symptoms in the interval. The patient voices understanding of current disease status and treatment options and is in agreement with the current care plan.  All questions were answered. The patient knows to call the clinic with any problems, questions or concerns. We can certainly see the patient much sooner if necessary.  Disclaimer: This note was dictated with voice recognition software. Similar sounding words can inadvertently be transcribed and may not be corrected upon review.

## 2015-11-09 ENCOUNTER — Ambulatory Visit
Admission: RE | Admit: 2015-11-09 | Discharge: 2015-11-09 | Disposition: A | Discharge status: Home / Self Care | Payer: BLUE CROSS/BLUE SHIELD | Source: Ambulatory Visit | Attending: Radiation Oncology | Admitting: Radiation Oncology

## 2015-11-09 DIAGNOSIS — C7949 Secondary malignant neoplasm of other parts of nervous system: Principal | ICD-10-CM

## 2015-11-09 DIAGNOSIS — C7931 Secondary malignant neoplasm of brain: Secondary | ICD-10-CM

## 2015-11-09 MED ORDER — GADOBENATE DIMEGLUMINE 529 MG/ML IV SOLN
16.0000 mL | Freq: Once | INTRAVENOUS | Status: AC | PRN
  Administered 2015-11-09: 16 mL via INTRAVENOUS

## 2015-11-11 ENCOUNTER — Ambulatory Visit: Payer: BLUE CROSS/BLUE SHIELD | Admitting: Radiation Oncology

## 2015-11-12 ENCOUNTER — Ambulatory Visit: Payer: BLUE CROSS/BLUE SHIELD | Admitting: Radiation Oncology

## 2015-11-19 ENCOUNTER — Ambulatory Visit (HOSPITAL_BASED_OUTPATIENT_CLINIC_OR_DEPARTMENT_OTHER): Payer: BLUE CROSS/BLUE SHIELD | Admitting: Internal Medicine

## 2015-11-19 ENCOUNTER — Encounter: Payer: Self-pay | Admitting: Internal Medicine

## 2015-11-19 ENCOUNTER — Telehealth: Payer: Self-pay | Admitting: *Deleted

## 2015-11-19 ENCOUNTER — Ambulatory Visit (HOSPITAL_BASED_OUTPATIENT_CLINIC_OR_DEPARTMENT_OTHER): Payer: BLUE CROSS/BLUE SHIELD

## 2015-11-19 ENCOUNTER — Telehealth: Payer: Self-pay | Admitting: Internal Medicine

## 2015-11-19 ENCOUNTER — Other Ambulatory Visit (HOSPITAL_BASED_OUTPATIENT_CLINIC_OR_DEPARTMENT_OTHER): Payer: BLUE CROSS/BLUE SHIELD

## 2015-11-19 VITALS — BP 135/91 | HR 69 | Temp 98.2°F | Resp 18 | Wt 176.4 lb

## 2015-11-19 DIAGNOSIS — C3432 Malignant neoplasm of lower lobe, left bronchus or lung: Secondary | ICD-10-CM

## 2015-11-19 DIAGNOSIS — Z9889 Other specified postprocedural states: Secondary | ICD-10-CM

## 2015-11-19 DIAGNOSIS — Z720 Tobacco use: Secondary | ICD-10-CM

## 2015-11-19 DIAGNOSIS — Z5112 Encounter for antineoplastic immunotherapy: Secondary | ICD-10-CM

## 2015-11-19 DIAGNOSIS — C7931 Secondary malignant neoplasm of brain: Secondary | ICD-10-CM

## 2015-11-19 DIAGNOSIS — R531 Weakness: Secondary | ICD-10-CM

## 2015-11-19 DIAGNOSIS — E440 Moderate protein-calorie malnutrition: Secondary | ICD-10-CM

## 2015-11-19 LAB — COMPREHENSIVE METABOLIC PANEL
ALT: 21 U/L (ref 0–55)
ANION GAP: 9 meq/L (ref 3–11)
AST: 26 U/L (ref 5–34)
Albumin: 3.7 g/dL (ref 3.5–5.0)
Alkaline Phosphatase: 123 U/L (ref 40–150)
BUN: 17 mg/dL (ref 7.0–26.0)
CALCIUM: 9.1 mg/dL (ref 8.4–10.4)
CHLORIDE: 112 meq/L — AB (ref 98–109)
CO2: 20 mEq/L — ABNORMAL LOW (ref 22–29)
Creatinine: 1 mg/dL (ref 0.7–1.3)
EGFR: 89 mL/min/{1.73_m2} — ABNORMAL LOW (ref >90)
Glucose: 91 mg/dl (ref 70–140)
POTASSIUM: 4.2 meq/L (ref 3.5–5.1)
Sodium: 142 mEq/L (ref 136–145)
Total Bilirubin: 0.29 mg/dL (ref 0.20–1.20)
Total Protein: 7.1 g/dL (ref 6.4–8.3)

## 2015-11-19 LAB — CBC WITH DIFFERENTIAL/PLATELET
BASO%: 0.9 % (ref 0.0–2.0)
BASOS ABS: 0.1 10*3/uL (ref 0.0–0.1)
EOS%: 3.3 % (ref 0.0–7.0)
Eosinophils Absolute: 0.3 10*3/uL (ref 0.0–0.5)
HEMATOCRIT: 43.6 % (ref 38.4–49.9)
HGB: 14.9 g/dL (ref 13.0–17.1)
LYMPH#: 2.9 10*3/uL (ref 0.9–3.3)
LYMPH%: 33.5 % (ref 14.0–49.0)
MCH: 32.4 pg (ref 27.2–33.4)
MCHC: 34.2 g/dL (ref 32.0–36.0)
MCV: 94.8 fL (ref 79.3–98.0)
MONO#: 0.9 10*3/uL (ref 0.1–0.9)
MONO%: 10.2 % (ref 0.0–14.0)
NEUT#: 4.4 10*3/uL (ref 1.5–6.5)
NEUT%: 52.1 % (ref 39.0–75.0)
PLATELETS: 269 10*3/uL (ref 140–400)
RBC: 4.6 10*6/uL (ref 4.20–5.82)
RDW: 13.2 % (ref 11.0–14.6)
WBC: 8.5 10*3/uL (ref 4.0–10.3)

## 2015-11-19 MED ORDER — HEPARIN SOD (PORK) LOCK FLUSH 100 UNIT/ML IV SOLN
500.0000 [IU] | Freq: Once | INTRAVENOUS | Status: DC | PRN
  Filled 2015-11-19: qty 5

## 2015-11-19 MED ORDER — SODIUM CHLORIDE 0.9 % IV SOLN
Freq: Once | INTRAVENOUS | Status: AC
  Administered 2015-11-19: 11:00:00 via INTRAVENOUS

## 2015-11-19 MED ORDER — SODIUM CHLORIDE 0.9 % IV SOLN
200.0000 mg | Freq: Once | INTRAVENOUS | Status: AC
  Administered 2015-11-19: 200 mg via INTRAVENOUS
  Filled 2015-11-19: qty 8

## 2015-11-19 MED ORDER — SODIUM CHLORIDE 0.9 % IJ SOLN
10.0000 mL | INTRAMUSCULAR | Status: DC | PRN
  Filled 2015-11-19: qty 10

## 2015-11-19 NOTE — Progress Notes (Signed)
[facility] [phone]   Fax:([phone]  OFFICE PROGRESS NOTE  Obrien, Timothy Graff, MD [address] [place] [address]  DIAGNOSIS: Stage IV (T2a, N1, M1b) non-small cell lung cancer, adenocarcinoma, negative EGFR mutation but equivocal EGFR amplification, negative ALK gene translocation and negative ROS 1 but with PDL-1 expression 100% presented with left lower lobe lung mass in addition to left hilar adenopathy and solitary metastatic brain lesion diagnosed in December 2016.  PRIOR THERAPY:  1) status post stereotactic radiotherapy and surgical resection diagnosed in December 2016. 2) status post video bronchoscopy with left VATS and wedge resection of the left upper lobe and left lower lobe superior segmentectomy with lymph node dissection under the care of Dr. Servando Obrien on 01/14/2015.  CURRENT THERAPY: Immunotherapy with Ketruda (pembrolizumab) 200 MG IV every 3 weeks, first dose 02/18/2015. Status post 14 cycles.  INTERVAL HISTORY: Timothy Obrien 55 y.o. male returns to the clinic today for follow-up visit. The patient is doing fine today with no specific complaints. He tolerated cycle #14 of his treatment with immunotherapy fairly well with no significant adverse effects. He denied having any skin rash or diarrhea. He has no nausea or vomiting, no fever or chills. He has no significant chest pain, shortness of breath, cough or hemoptysis. He denied having any fever or chills. He is here today to start cycle #15.  MEDICAL HISTORY: Past Medical History:  Diagnosis Date  . Bronchitis   . Chronic fatigue 09/17/2015  . Lung cancer ([place])    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  . Pneumonia   . Seizures ([place])    last 12/11/14  . Shortness of breath dyspnea    with exertion  . Tobacco abuse     ALLERGIES:  has No Known Allergies.  MEDICATIONS:  Current Outpatient Prescriptions  Medication Sig Dispense Refill  . mirtazapine (REMERON) 30  MG tablet TAKE 1 TABLET (30 MG TOTAL) BY MOUTH AT BEDTIME. 90 tablet 0   No current facility-administered medications for this visit.     SURGICAL HISTORY:  Past Surgical History:  Procedure Laterality Date  . APPLICATION OF CRANIAL NAVIGATION N/A 12/26/2014   Procedure: APPLICATION OF CRANIAL NAVIGATION;  Surgeon: Timothy Ny Ditty, MD;  Location: [place] NEURO ORS;  Service: Neurosurgery;  Laterality: N/A;  . CRANIOTOMY N/A 12/26/2014   Procedure: CRANIOTOMY TUMOR EXCISION with BrainLab;  Surgeon: Timothy Ny Ditty, MD;  Location: [place] [facility]UR[facility] ORS;  Service: Neurosurgery;  Laterality: N/A;  CRANIOTOMY TUMOR EXCISION with Stealth  . HERNIA REPAIR    . VASECTOMY    . VIDEO ASSISTED THORACOSCOPY (VATS)/WEDGE RESECTION Left 01/14/2015   Procedure: VIDEO ASSISTED THORACOSCOPY (VATS)/WEDGE RESECTION, Superior segmentectomy left  lower lobe, wedge resection of left upper lobe, multiple lymph node disection, On Q insertion.;  Surgeon: Timothy Isaac, MD;  Location: [facility];  Service: Thoracic;  Laterality: Left;  Marland Kitchen VIDEO BRONCHOSCOPY Bilateral 12/16/2014   Procedure: VIDEO BRONCHOSCOPY WITH FLUORO;  Surgeon: Timothy Gobble, MD;  Location: [facility];  Service: Cardiopulmonary;  Laterality: Bilateral;  . VIDEO BRONCHOSCOPY N/A 01/14/2015   Procedure: VIDEO BRONCHOSCOPY;  Surgeon: Timothy Isaac, MD;  Location: [facility];  Service: Thoracic;  Laterality: N/A;    REVIEW OF SYSTEMS:  A comprehensive review of systems was negative.   PHYSICAL EXAMINATION: General appearance: alert, cooperative, fatigued and no distress Head: Normocephalic, without obvious abnormality, atraumatic Neck: no adenopathy, no JVD, supple, symmetrical, trachea midline and  thyroid not enlarged, symmetric, no tenderness/mass/nodules Lymph nodes: Cervical, supraclavicular, and axillary nodes normal. Resp: clear to auscultation bilaterally Back: symmetric, no curvature. ROM normal. No CVA tenderness. Cardio: regular rate and  rhythm, S1, S2 normal, no murmur, click, rub or gallop GI: soft, non-tender; bowel sounds normal; no masses,  no organomegaly Extremities: extremities normal, atraumatic, no cyanosis or edema Neurologic: Alert and oriented X 3, normal strength and tone. Normal symmetric reflexes. Normal coordination and gait  ECOG PERFORMANCE STATUS: 1 - Symptomatic but completely ambulatory  Blood pressure (!) 135/91, pulse 69, temperature 98.2 F (36.8 C), temperature source Oral, resp. rate 18, weight 176 lb 6.4 oz (80 kg), SpO2 100 %.  LABORATORY DATA: Lab Results  Component Value Date   WBC 8.5 11/19/2015   HGB 14.9 11/19/2015   HCT 43.6 11/19/2015   MCV 94.8 11/19/2015   PLT 269 11/19/2015      Chemistry      Component Value Date/Time   NA 142 11/19/2015 0920   K 4.2 11/19/2015 0920   CL 109 01/16/2015 0450   CO2 20 (L) 11/19/2015 0920   BUN 17.0 11/19/2015 0920   CREATININE 1.0 11/19/2015 0920      Component Value Date/Time   CALCIUM 9.1 11/19/2015 0920   ALKPHOS 123 11/19/2015 0920   AST 26 11/19/2015 0920   ALT 21 11/19/2015 0920   BILITOT 0.29 11/19/2015 0920       RADIOGRAPHIC STUDIES: Mr Timothy Obrien OZ Contrast  Result Date: 11/09/2015 CLINICAL DATA:  55 year old male status post preoperative SRS to a solitary brain left frontal brain metastasis due to non-small cell lung cancer. SRS Treatment on 12/25/2014. Restaging. Subsequent encounter. EXAM: MRI HEAD WITHOUT AND WITH CONTRAST TECHNIQUE: Multiplanar, multiecho pulse sequences of the brain and surrounding structures were obtained without and with intravenous contrast. CONTRAST:  32m MULTIHANCE GADOBENATE DIMEGLUMINE 529 MG/ML IV SOLN COMPARISON:  07/07/2015 brain MRI and earlier. FINDINGS: Brain: Left superior frontal gyrus treatment site demonstrates a small cystic cavity with surrounding T2 and FLAIR hyperintensity. Small volume hemosiderin. No associated mass effect. The resection cavity size is stable. There is associated  intrinsic T1 hyperintensity about the cavity, such that much of the post-contrast increased T1 signal does not reflect genuine enhancement. There is is minimal nodular enhancement along the anterior inferior aspect measuring 3 mm (series 10, image 125) which is not definitely changed. Overlying craniotomy. No other abnormal intracranial enhancement. Stable gray and white matter signal elsewhere. No restricted diffusion to suggest acute infarction. No midline shift, ventriculomegaly, extra-axial collection or acute intracranial hemorrhage. Cervicomedullary junction and pituitary are within normal limits. Vascular: Major intracranial vascular flow voids are stable. Skull and upper cervical spine: Negative visualized cervical spinal cord. Visible bone marrow signal is within normal limits. Sinuses/Orbits: Orbits soft tissues appear stable and normal. Stable paranasal sinuses, mild maxillary retention cysts. Other: Visible internal auditory structures appear normal. Mastoids are clear. No acute scalp soft tissue findings. IMPRESSION: 1. Continued satisfactory post treatment appearance of the solitary left superior frontal gyrus metastasis. Attention directed on followup to the small 3 mm nodular area of enhancement along the anterior inferior resection cavity. 2. No new metastatic disease or new intracranial abnormality identified. Electronically Signed   By: HGenevie AnnM.D.   On: 11/09/2015 14:38    ASSESSMENT AND PLAN: This is a very pleasant 55years old white male diagnosed with a stage IV non-small cell lung cancer, T2a, N1, M1b) non-small cell lung cancer presented with solitary brain metastasis status  post stereotactic radiotherapy followed by craniotomy and resection of the brain tumor. The patient also underwent wedge resection of the left upper lobe as well as left lower lobe superior segmentectomy with lymph node dissection. Unfortunately the left lower lobe resection margin was positive for  adenocarcinoma. His molecular study showed equivocal EGFR amplification, negative ALK gene translocation, negative ROS 1 but the immunohistochemical stains showed positive for PDL 1 expression at 100%. He is currently on treatment with Ketruda (pembrolizumab) status post 14 cycles and tolerated his treatment fairly well. I recommended for the patient to continue his current immunotherapy with [place] (pembrolizumab) and he will proceed with cycle #15 today as scheduled. He would come back for follow-up visit in 3 weeks for reevaluation before starting cycle #16 after repeating CT scan of the chest, abdomen and pelvis for restaging of his disease.  The patient was advised to call if he has any concerning symptoms in the interval. The patient voices understanding of current disease status and treatment options and is in agreement with the current care plan.  All questions were answered. The patient knows to call the clinic with any problems, questions or concerns. We can certainly see the patient much sooner if necessary.  Disclaimer: This note was dictated with voice recognition software. Similar sounding words can inadvertently be transcribed and may not be corrected upon review.

## 2015-11-19 NOTE — Patient Instructions (Signed)
[facility] Discharge Instructions for Patients Receiving Chemotherapy  Today you received the following chemotherapy agents:  Keytruda.  To help prevent nausea and vomiting after your treatment, we encourage you to take your nausea medication as prescribed.   If you develop nausea and vomiting that is not controlled by your nausea medication, call the clinic.   BELOW ARE SYMPTOMS THAT SHOULD BE REPORTED IMMEDIATELY:  *FEVER GREATER THAN 100.5 F  *CHILLS WITH OR WITHOUT FEVER  NAUSEA AND VOMITING THAT IS NOT CONTROLLED WITH YOUR NAUSEA MEDICATION  *UNUSUAL SHORTNESS OF BREATH  *UNUSUAL BRUISING OR BLEEDING  TENDERNESS IN MOUTH AND THROAT WITH OR WITHOUT PRESENCE OF ULCERS  *URINARY PROBLEMS  *BOWEL PROBLEMS  UNUSUAL RASH Items with * indicate a potential emergency and should be followed up as soon as possible.  Feel free to call the clinic you have any questions or concerns. The clinic phone number is [phone].  Please show the CHEMO ALERT CARD at check-in to the Emergency Department and triage nurse.   

## 2015-11-19 NOTE — Telephone Encounter (Signed)
Per LOS I have scheduled appts and notified the scheduer

## 2015-11-19 NOTE — Telephone Encounter (Signed)
Message sent to chemo scheduler to be added per 11/19/15 los. 2 bottles of contrast and instructions was given per 11/19/15 los. Appointments scheduled per 11/19/15 los. AVS report and appointment schedule given to patient per 11/19/15 los.

## 2015-12-07 ENCOUNTER — Encounter (HOSPITAL_COMMUNITY): Payer: Self-pay

## 2015-12-07 ENCOUNTER — Encounter: Payer: Self-pay | Admitting: Internal Medicine

## 2015-12-07 ENCOUNTER — Ambulatory Visit (HOSPITAL_COMMUNITY)
Admission: RE | Admit: 2015-12-07 | Discharge: 2015-12-07 | Disposition: A | Discharge status: Home / Self Care | Payer: BLUE CROSS/BLUE SHIELD | Source: Ambulatory Visit | Attending: Internal Medicine | Admitting: Internal Medicine

## 2015-12-07 DIAGNOSIS — J439 Emphysema, unspecified: Secondary | ICD-10-CM | POA: Insufficient documentation

## 2015-12-07 DIAGNOSIS — I700 Atherosclerosis of aorta: Secondary | ICD-10-CM | POA: Insufficient documentation

## 2015-12-07 DIAGNOSIS — R911 Solitary pulmonary nodule: Secondary | ICD-10-CM | POA: Diagnosis not present

## 2015-12-07 DIAGNOSIS — C3432 Malignant neoplasm of lower lobe, left bronchus or lung: Secondary | ICD-10-CM | POA: Insufficient documentation

## 2015-12-07 DIAGNOSIS — K5730 Diverticulosis of large intestine without perforation or abscess without bleeding: Secondary | ICD-10-CM | POA: Insufficient documentation

## 2015-12-07 DIAGNOSIS — Z5112 Encounter for antineoplastic immunotherapy: Secondary | ICD-10-CM

## 2015-12-07 MED ORDER — IOPAMIDOL (ISOVUE-300) INJECTION 61%
100.0000 mL | Freq: Once | INTRAVENOUS | Status: AC | PRN
  Administered 2015-12-07: 100 mL via INTRAVENOUS

## 2015-12-07 NOTE — Progress Notes (Signed)
Pt re enrolled for the [employer] for [place] for 2018.  I faxed the application to [employer] today and received confirmation that it went thru.

## 2015-12-08 ENCOUNTER — Encounter: Payer: Self-pay | Admitting: *Deleted

## 2015-12-08 NOTE — Progress Notes (Signed)
Oncology Nurse Navigator Documentation  Oncology Nurse Navigator Flowsheets 12/08/2015  Navigator Location [facility]  Navigator Encounter Type Other/I helped complete papers for financial assistance. I completed and gave to [facility]  Treatment Phase Treatment  Barriers/Navigation Needs Financial  Interventions Other  Acuity Level 2  Acuity Level 2 Other  Time Spent with Patient 15

## 2015-12-10 ENCOUNTER — Ambulatory Visit (HOSPITAL_BASED_OUTPATIENT_CLINIC_OR_DEPARTMENT_OTHER): Payer: BLUE CROSS/BLUE SHIELD | Admitting: Internal Medicine

## 2015-12-10 ENCOUNTER — Ambulatory Visit (HOSPITAL_BASED_OUTPATIENT_CLINIC_OR_DEPARTMENT_OTHER): Payer: BLUE CROSS/BLUE SHIELD

## 2015-12-10 ENCOUNTER — Other Ambulatory Visit (HOSPITAL_BASED_OUTPATIENT_CLINIC_OR_DEPARTMENT_OTHER): Payer: BLUE CROSS/BLUE SHIELD

## 2015-12-10 ENCOUNTER — Telehealth: Payer: Self-pay | Admitting: Internal Medicine

## 2015-12-10 ENCOUNTER — Encounter: Payer: Self-pay | Admitting: Internal Medicine

## 2015-12-10 VITALS — BP 147/96 | HR 75 | Temp 98.0°F | Resp 18 | Ht 72.0 in | Wt 174.6 lb

## 2015-12-10 DIAGNOSIS — R5382 Chronic fatigue, unspecified: Secondary | ICD-10-CM

## 2015-12-10 DIAGNOSIS — Z5112 Encounter for antineoplastic immunotherapy: Secondary | ICD-10-CM

## 2015-12-10 DIAGNOSIS — C3432 Malignant neoplasm of lower lobe, left bronchus or lung: Secondary | ICD-10-CM | POA: Diagnosis not present

## 2015-12-10 DIAGNOSIS — C7931 Secondary malignant neoplasm of brain: Secondary | ICD-10-CM

## 2015-12-10 DIAGNOSIS — Z9889 Other specified postprocedural states: Secondary | ICD-10-CM

## 2015-12-10 DIAGNOSIS — E440 Moderate protein-calorie malnutrition: Secondary | ICD-10-CM

## 2015-12-10 DIAGNOSIS — Z720 Tobacco use: Secondary | ICD-10-CM

## 2015-12-10 DIAGNOSIS — R531 Weakness: Secondary | ICD-10-CM

## 2015-12-10 LAB — COMPREHENSIVE METABOLIC PANEL
ALBUMIN: 3.7 g/dL (ref 3.5–5.0)
ALK PHOS: 125 U/L (ref 40–150)
ALT: 16 U/L (ref 0–55)
AST: 23 U/L (ref 5–34)
Anion Gap: 10 mEq/L (ref 3–11)
BUN: 13.2 mg/dL (ref 7.0–26.0)
CHLORIDE: 111 meq/L — AB (ref 98–109)
CO2: 19 meq/L — AB (ref 22–29)
Calcium: 9.1 mg/dL (ref 8.4–10.4)
Creatinine: 1 mg/dL (ref 0.7–1.3)
EGFR: 85 mL/min/{1.73_m2} — AB (ref >90)
GLUCOSE: 94 mg/dL (ref 70–140)
POTASSIUM: 4.3 meq/L (ref 3.5–5.1)
SODIUM: 140 meq/L (ref 136–145)
Total Bilirubin: 0.24 mg/dL (ref 0.20–1.20)
Total Protein: 7.1 g/dL (ref 6.4–8.3)

## 2015-12-10 LAB — CBC WITH DIFFERENTIAL/PLATELET
BASO%: 1.1 % (ref 0.0–2.0)
BASOS ABS: 0.1 10*3/uL (ref 0.0–0.1)
EOS ABS: 0.2 10*3/uL (ref 0.0–0.5)
EOS%: 2.5 % (ref 0.0–7.0)
HCT: 43.5 % (ref 38.4–49.9)
HGB: 14.7 g/dL (ref 13.0–17.1)
LYMPH%: 18.6 % (ref 14.0–49.0)
MCH: 32 pg (ref 27.2–33.4)
MCHC: 33.8 g/dL (ref 32.0–36.0)
MCV: 94.8 fL (ref 79.3–98.0)
MONO#: 0.7 10*3/uL (ref 0.1–0.9)
MONO%: 9.1 % (ref 0.0–14.0)
NEUT#: 5.5 10*3/uL (ref 1.5–6.5)
NEUT%: 68.7 % (ref 39.0–75.0)
Platelets: 286 10*3/uL (ref 140–400)
RBC: 4.59 10*6/uL (ref 4.20–5.82)
RDW: 13.2 % (ref 11.0–14.6)
WBC: 8 10*3/uL (ref 4.0–10.3)
lymph#: 1.5 10*3/uL (ref 0.9–3.3)

## 2015-12-10 MED ORDER — SODIUM CHLORIDE 0.9 % IV SOLN
Freq: Once | INTRAVENOUS | Status: AC
  Administered 2015-12-10: 10:00:00 via INTRAVENOUS

## 2015-12-10 MED ORDER — SODIUM CHLORIDE 0.9 % IV SOLN
200.0000 mg | Freq: Once | INTRAVENOUS | Status: AC
  Administered 2015-12-10: 200 mg via INTRAVENOUS
  Filled 2015-12-10: qty 8

## 2015-12-10 NOTE — Telephone Encounter (Signed)
Patient already on schedule for lab/fu/tx q3w thru February. Patient aware and will be given new schedule in inf area.

## 2015-12-10 NOTE — Patient Instructions (Signed)
[facility] Discharge Instructions for Patients Receiving Chemotherapy  Today you received the following chemotherapy agents: Keytruda   To help prevent nausea and vomiting after your treatment, we encourage you to take your nausea medication as directed    If you develop nausea and vomiting that is not controlled by your nausea medication, call the clinic.   BELOW ARE SYMPTOMS THAT SHOULD BE REPORTED IMMEDIATELY:  *FEVER GREATER THAN 100.5 F  *CHILLS WITH OR WITHOUT FEVER  NAUSEA AND VOMITING THAT IS NOT CONTROLLED WITH YOUR NAUSEA MEDICATION  *UNUSUAL SHORTNESS OF BREATH  *UNUSUAL BRUISING OR BLEEDING  TENDERNESS IN MOUTH AND THROAT WITH OR WITHOUT PRESENCE OF ULCERS  *URINARY PROBLEMS  *BOWEL PROBLEMS  UNUSUAL RASH Items with * indicate a potential emergency and should be followed up as soon as possible.  Feel free to call the clinic you have any questions or concerns. The clinic phone number is [phone].  Please show the CHEMO ALERT CARD at check-in to the Emergency Department and triage nurse.   

## 2015-12-10 NOTE — Progress Notes (Signed)
[place] [phone]   [phone]  OFFICE PROGRESS NOTE  FRIED, [name], MD [address] [place] [address]  DIAGNOSIS: Stage IV (T2a, N1, M1b) non-small cell lung cancer, adenocarcinoma, negative EGFR mutation but equivocal EGFR amplification, negative ALK gene translocation and negative ROS 1 but with PDL-1 expression 100% presented with left lower lobe lung mass in addition to left hilar adenopathy and solitary metastatic brain lesion diagnosed in December 2016.  PRIOR THERAPY:  1) status post stereotactic radiotherapy and surgical resection diagnosed in December 2016. 2) status post video bronchoscopy with left VATS and wedge resection of the left upper lobe and left lower lobe superior segmentectomy with lymph node dissection under the care of Dr. Servando Snare on 01/14/2015.  CURRENT THERAPY: Immunotherapy with Ketruda (pembrolizumab) 200 MG IV every 3 weeks, first dose 02/18/2015. Status post 15 cycles.  INTERVAL HISTORY: Timothy Obrien 55 y.o. male returns to the clinic today for follow-up visit. The patient is doing fine today with no specific complaints. He enjoyed his Thanksgiving with his family. He tolerated cycle #15 of his treatment with immunotherapy fairly well with no significant adverse effects. He denied having any skin rash or diarrhea. He has no nausea or vomiting, no fever or chills. He has no significant chest pain, shortness of breath, cough or hemoptysis. He denied having any fever or chills. He had repeat CT scan of the chest, abdomen and pelvis performed recently and he is here for evaluation and discussion of his scan results.  MEDICAL HISTORY: Past Medical History:  Diagnosis Date  . Bronchitis   . Chronic fatigue 09/17/2015  . Lung cancer ([place])    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  . Pneumonia   . Seizures ([place])    last 12/11/14  . Shortness of breath dyspnea    with exertion  . Tobacco abuse      ALLERGIES:  has No Known Allergies.  MEDICATIONS:  Current Outpatient Prescriptions  Medication Sig Dispense Refill  . mirtazapine (REMERON) 30 MG tablet TAKE 1 TABLET (30 MG TOTAL) BY MOUTH AT BEDTIME. 90 tablet 0   No current facility-administered medications for this visit.     SURGICAL HISTORY:  Past Surgical History:  Procedure Laterality Date  . APPLICATION OF CRANIAL NAVIGATION N/A 12/26/2014   Procedure: APPLICATION OF CRANIAL NAVIGATION;  Surgeon: Kevan Ny Ditty, MD;  Location: [place] [facility];  Service: Neurosurgery;  Laterality: N/A;  . CRANIOTOMY N/A 12/26/2014   Procedure: CRANIOTOMY TUMOR EXCISION with BrainLab;  Surgeon: Kevan Ny Ditty, MD;  Location: [place] [facility];  Service: Neurosurgery;  Laterality: N/A;  CRANIOTOMY TUMOR EXCISION with [facility]  . HERNIA REPAIR    . VASECTOMY    . VIDEO ASSISTED THORACOSCOPY (VATS)/WEDGE RESECTION Left 01/14/2015   Procedure: VIDEO ASSISTED THORACOSCOPY (VATS)/WEDGE RESECTION, Superior segmentectomy left  lower lobe, wedge resection of left upper lobe, multiple lymph node disection, On Q insertion.;  Surgeon: Grace Isaac, MD;  Location: [place];  Service: Thoracic;  Laterality: Left;  Marland Kitchen VIDEO BRONCHOSCOPY Bilateral 12/16/2014   Procedure: VIDEO BRONCHOSCOPY WITH FLUORO;  Surgeon: Collene Gobble, MD;  Location: [facility];  Service: Cardiopulmonary;  Laterality: Bilateral;  . VIDEO BRONCHOSCOPY N/A 01/14/2015   Procedure: VIDEO BRONCHOSCOPY;  Surgeon: Grace Isaac, MD;  Location: [facility] OR;  Service: Thoracic;  Laterality: N/A;    REVIEW OF SYSTEMS:  Constitutional: negative Eyes: negative Ears, nose, mouth, throat, and face: negative  Respiratory: negative Cardiovascular: negative Gastrointestinal: negative Genitourinary:negative Integument/breast: negative Hematologic/lymphatic: negative Musculoskeletal:negative Neurological: negative Behavioral/Psych: negative Endocrine: negative Allergic/Immunologic: negative    PHYSICAL EXAMINATION: General appearance: alert, cooperative, fatigued and no distress Head: Normocephalic, without obvious abnormality, atraumatic Neck: no adenopathy, no JVD, supple, symmetrical, trachea midline and thyroid not enlarged, symmetric, no tenderness/mass/nodules Lymph nodes: Cervical, supraclavicular, and axillary nodes normal. Resp: clear to auscultation bilaterally Back: symmetric, no curvature. ROM normal. No CVA tenderness. Cardio: regular rate and rhythm, S1, S2 normal, no murmur, click, rub or gallop GI: soft, non-tender; bowel sounds normal; no masses,  no organomegaly Extremities: extremities normal, atraumatic, no cyanosis or edema Neurologic: Alert and oriented X 3, normal strength and tone. Normal symmetric reflexes. Normal coordination and gait  ECOG PERFORMANCE STATUS: 1 - Symptomatic but completely ambulatory  Blood pressure (!) 147/96, pulse 75, temperature 98 F (36.7 C), temperature source Oral, resp. rate 18, height 6' (1.829 m), weight 174 lb 9.6 oz (79.2 kg), SpO2 100 %.  LABORATORY DATA: Lab Results  Component Value Date   WBC 8.0 12/10/2015   HGB 14.7 12/10/2015   HCT 43.5 12/10/2015   MCV 94.8 12/10/2015   PLT 286 12/10/2015      Chemistry      Component Value Date/Time   NA 140 12/10/2015 0839   K 4.3 12/10/2015 0839   CL 109 01/16/2015 0450   CO2 19 (L) 12/10/2015 0839   BUN 13.2 12/10/2015 0839   CREATININE 1.0 12/10/2015 0839      Component Value Date/Time   CALCIUM 9.1 12/10/2015 0839   ALKPHOS 125 12/10/2015 0839   AST 23 12/10/2015 0839   ALT 16 12/10/2015 0839   BILITOT 0.24 12/10/2015 0839       RADIOGRAPHIC STUDIES: Ct Chest W Contrast  Result Date: 12/07/2015 CLINICAL DATA:  Followup left lower lobe non-small cell lung carcinoma. Undergoing immunotherapy. Restaging. EXAM: CT CHEST, ABDOMEN, AND PELVIS WITH CONTRAST TECHNIQUE: Multidetector CT imaging of the chest, abdomen and pelvis was performed following the  standard protocol during bolus administration of intravenous contrast. CONTRAST:  128m ISOVUE-300 IOPAMIDOL (ISOVUE-300) INJECTION 61% COMPARISON:  10/06/2015 FINDINGS: CT CHEST FINDINGS Cardiovascular: No acute findings. Mediastinum/Lymph Nodes: No masses or pathologically enlarged lymph nodes identified. Lungs/Pleura: Stable pulmonary emphysema. 6 mm nodular density in posterior right upper lobe on image 41/3 is unchanged in size. Postsurgical changes again seen in left perihilar region. Pulmonary nodule in the superior medial aspect of the left lower lobe measures 13 x 11 mm on image 95/3 compared to 14 x 13 mm previously. 7 mm nodule in the posterior lateral left lung base on image 135/3 remains stable. Probable pleural- parenchymal scarring in the left lower lobe is unchanged. No new or enlarging pulmonary nodules or masses identified. No evidence of pleural effusion. Musculoskeletal: No suspicious bone lesions or other significant abnormality. CT ABDOMEN AND PELVIS FINDINGS Hepatobiliary: No masses identified. Stable tiny sub-cm cyst in segment 6 the right hepatic lobe. Gallbladder is unremarkable. Pancreas:  No mass or inflammatory changes. Spleen:  Within normal limits in size and appearance. Adrenals/Urinary tract: No masses or hydronephrosis. Stable tiny sub-cm left renal cysts. Unremarkable appearance of urinary bladder. Stomach/Bowel: No evidence of obstruction, inflammatory process, or abnormal fluid collections. Sigmoid diverticulosis again noted, without evidence of diverticulitis. Vascular/Lymphatic: No pathologically enlarged lymph nodes identified. No abdominal aortic aneurysm. Aortic atherosclerosis. Reproductive:  No mass or other significant abnormality identified. Other:  None. Musculoskeletal:  No suspicious bone lesions identified. IMPRESSION: Slight decrease in size of infrahilar  left lower lobe pulmonary nodule. Other sub-cm pulmonary nodules in the left lower lobe in right upper lobe are  stable. No new or progressive disease identified. No evidence of abdominal or pelvic metastatic disease. Incidental findings including aortic atherosclerosis, pulmonary emphysema, and sigmoid diverticulosis. Electronically Signed   By: Earle Gell M.D.   On: 12/07/2015 08:48   Ct Abdomen Pelvis W Contrast  Result Date: 12/07/2015 CLINICAL DATA:  Followup left lower lobe non-small cell lung carcinoma. Undergoing immunotherapy. Restaging. EXAM: CT CHEST, ABDOMEN, AND PELVIS WITH CONTRAST TECHNIQUE: Multidetector CT imaging of the chest, abdomen and pelvis was performed following the standard protocol during bolus administration of intravenous contrast. CONTRAST:  150m ISOVUE-300 IOPAMIDOL (ISOVUE-300) INJECTION 61% COMPARISON:  10/06/2015 FINDINGS: CT CHEST FINDINGS Cardiovascular: No acute findings. Mediastinum/Lymph Nodes: No masses or pathologically enlarged lymph nodes identified. Lungs/Pleura: Stable pulmonary emphysema. 6 mm nodular density in posterior right upper lobe on image 41/3 is unchanged in size. Postsurgical changes again seen in left perihilar region. Pulmonary nodule in the superior medial aspect of the left lower lobe measures 13 x 11 mm on image 95/3 compared to 14 x 13 mm previously. 7 mm nodule in the posterior lateral left lung base on image 135/3 remains stable. Probable pleural- parenchymal scarring in the left lower lobe is unchanged. No new or enlarging pulmonary nodules or masses identified. No evidence of pleural effusion. Musculoskeletal: No suspicious bone lesions or other significant abnormality. CT ABDOMEN AND PELVIS FINDINGS Hepatobiliary: No masses identified. Stable tiny sub-cm cyst in segment 6 the right hepatic lobe. Gallbladder is unremarkable. Pancreas:  No mass or inflammatory changes. Spleen:  Within normal limits in size and appearance. Adrenals/Urinary tract: No masses or hydronephrosis. Stable tiny sub-cm left renal cysts. Unremarkable appearance of urinary bladder.  Stomach/Bowel: No evidence of obstruction, inflammatory process, or abnormal fluid collections. Sigmoid diverticulosis again noted, without evidence of diverticulitis. Vascular/Lymphatic: No pathologically enlarged lymph nodes identified. No abdominal aortic aneurysm. Aortic atherosclerosis. Reproductive:  No mass or other significant abnormality identified. Other:  None. Musculoskeletal:  No suspicious bone lesions identified. IMPRESSION: Slight decrease in size of infrahilar left lower lobe pulmonary nodule. Other sub-cm pulmonary nodules in the left lower lobe in right upper lobe are stable. No new or progressive disease identified. No evidence of abdominal or pelvic metastatic disease. Incidental findings including aortic atherosclerosis, pulmonary emphysema, and sigmoid diverticulosis. Electronically Signed   By: JEarle GellM.D.   On: 12/07/2015 08:48    ASSESSMENT AND PLAN: This is a very pleasant 55years old white male diagnosed with a stage IV non-small cell lung cancer, T2a, N1, M1b) non-small cell lung cancer presented with solitary brain metastasis status post stereotactic radiotherapy followed by craniotomy and resection of the brain tumor. The patient also underwent wedge resection of the left upper lobe as well as left lower lobe superior segmentectomy with lymph node dissection. Unfortunately the left lower lobe resection margin was positive for adenocarcinoma. His molecular study showed equivocal EGFR amplification, negative ALK gene translocation, negative ROS 1 but the immunohistochemical stains showed positive for PDL 1 expression at 100%. He is currently on treatment with Ketruda (pembrolizumab) status post 15 cycles and tolerated his treatment fairly well. The recent CT scan of the chest, abdomen and pelvis showed no evidence for disease progression. I discussed the scan results with the patient today. I recommended for him to proceed with cycle #16 today as scheduled. He would come  back for follow-up visit in 3 weeks for reevaluation before starting  cycle #17.  The patient was advised to call if he has any concerning symptoms in the interval. The patient voices understanding of current disease status and treatment options and is in agreement with the current care plan.  All questions were answered. The patient knows to call the clinic with any problems, questions or concerns. We can certainly see the patient much sooner if necessary. I spent 15 minutes counseling the patient face to face. The total time spent in the appointment was 25 minutes.  Disclaimer: This note was dictated with voice recognition software. Similar sounding words can inadvertently be transcribed and may not be corrected upon review.

## 2015-12-31 ENCOUNTER — Ambulatory Visit (HOSPITAL_BASED_OUTPATIENT_CLINIC_OR_DEPARTMENT_OTHER): Payer: BLUE CROSS/BLUE SHIELD

## 2015-12-31 ENCOUNTER — Telehealth: Payer: Self-pay | Admitting: Internal Medicine

## 2015-12-31 ENCOUNTER — Other Ambulatory Visit (HOSPITAL_BASED_OUTPATIENT_CLINIC_OR_DEPARTMENT_OTHER): Payer: BLUE CROSS/BLUE SHIELD

## 2015-12-31 ENCOUNTER — Ambulatory Visit (HOSPITAL_BASED_OUTPATIENT_CLINIC_OR_DEPARTMENT_OTHER): Payer: BLUE CROSS/BLUE SHIELD | Admitting: Internal Medicine

## 2015-12-31 ENCOUNTER — Encounter: Payer: Self-pay | Admitting: Internal Medicine

## 2015-12-31 VITALS — BP 130/92 | HR 75 | Temp 97.7°F | Resp 17 | Ht 72.0 in | Wt 177.1 lb

## 2015-12-31 DIAGNOSIS — C7931 Secondary malignant neoplasm of brain: Secondary | ICD-10-CM

## 2015-12-31 DIAGNOSIS — C3432 Malignant neoplasm of lower lobe, left bronchus or lung: Secondary | ICD-10-CM | POA: Diagnosis not present

## 2015-12-31 DIAGNOSIS — I10 Essential (primary) hypertension: Secondary | ICD-10-CM | POA: Diagnosis not present

## 2015-12-31 DIAGNOSIS — Z5112 Encounter for antineoplastic immunotherapy: Secondary | ICD-10-CM

## 2015-12-31 HISTORY — DX: Essential (primary) hypertension: I10

## 2015-12-31 LAB — CBC WITH DIFFERENTIAL/PLATELET
BASO%: 1.5 % (ref 0.0–2.0)
BASOS ABS: 0.1 10*3/uL (ref 0.0–0.1)
EOS%: 4.7 % (ref 0.0–7.0)
Eosinophils Absolute: 0.3 10*3/uL (ref 0.0–0.5)
HCT: 44.7 % (ref 38.4–49.9)
HEMOGLOBIN: 15.3 g/dL (ref 13.0–17.1)
LYMPH%: 29.1 % (ref 14.0–49.0)
MCH: 32.2 pg (ref 27.2–33.4)
MCHC: 34.2 g/dL (ref 32.0–36.0)
MCV: 94.1 fL (ref 79.3–98.0)
MONO#: 0.8 10*3/uL (ref 0.1–0.9)
MONO%: 11.2 % (ref 0.0–14.0)
NEUT#: 3.7 10*3/uL (ref 1.5–6.5)
NEUT%: 53.5 % (ref 39.0–75.0)
Platelets: 275 10*3/uL (ref 140–400)
RBC: 4.75 10*6/uL (ref 4.20–5.82)
RDW: 13.3 % (ref 11.0–14.6)
WBC: 6.9 10*3/uL (ref 4.0–10.3)
lymph#: 2 10*3/uL (ref 0.9–3.3)

## 2015-12-31 LAB — COMPREHENSIVE METABOLIC PANEL
ALBUMIN: 3.8 g/dL (ref 3.5–5.0)
ALK PHOS: 137 U/L (ref 40–150)
ALT: 19 U/L (ref 0–55)
AST: 23 U/L (ref 5–34)
Anion Gap: 11 mEq/L (ref 3–11)
BUN: 15.7 mg/dL (ref 7.0–26.0)
CHLORIDE: 110 meq/L — AB (ref 98–109)
CO2: 18 meq/L — AB (ref 22–29)
Calcium: 9 mg/dL (ref 8.4–10.4)
Creatinine: 1 mg/dL (ref 0.7–1.3)
EGFR: 83 mL/min/{1.73_m2} — AB (ref >90)
GLUCOSE: 96 mg/dL (ref 70–140)
POTASSIUM: 4.1 meq/L (ref 3.5–5.1)
SODIUM: 139 meq/L (ref 136–145)
Total Bilirubin: 0.42 mg/dL (ref 0.20–1.20)
Total Protein: 7.3 g/dL (ref 6.4–8.3)

## 2015-12-31 MED ORDER — SODIUM CHLORIDE 0.9 % IV SOLN
Freq: Once | INTRAVENOUS | Status: AC
  Administered 2015-12-31: 13:00:00 via INTRAVENOUS

## 2015-12-31 MED ORDER — SODIUM CHLORIDE 0.9 % IV SOLN
200.0000 mg | Freq: Once | INTRAVENOUS | Status: AC
  Administered 2015-12-31: 200 mg via INTRAVENOUS
  Filled 2015-12-31: qty 8

## 2015-12-31 NOTE — Progress Notes (Signed)
[place] [phone]   [phone]  OFFICE PROGRESS NOTE  Obrien, Timothy Graff, MD [address] [place] [address]  DIAGNOSIS: Stage IV (T2a, N1, M1b) non-small cell lung cancer, adenocarcinoma, negative EGFR mutation but equivocal EGFR amplification, negative ALK gene translocation and negative ROS 1 but with PDL-1 expression 100% presented with left lower lobe lung mass in addition to left hilar adenopathy and solitary metastatic brain lesion diagnosed in December 2016.  PRIOR THERAPY:  1) status post stereotactic radiotherapy and surgical resection diagnosed in December 2016. 2) status post video bronchoscopy with left VATS and wedge resection of the left upper lobe and left lower lobe superior segmentectomy with lymph node dissection under the care of Dr. Servando Snare on 01/14/2015.  CURRENT THERAPY: Immunotherapy with Ketruda (pembrolizumab) 200 MG IV every 3 weeks, first dose 02/18/2015. Status post 16 cycles.  INTERVAL HISTORY: Timothy Obrien 55 y.o. male came to the clinic today for follow-up visit. The patient is feeling fine with no complaints. He is tolerating his treatment with immunotherapy with [name] (pembrolizumab) fairly well. He is status post 16 cycles of treatment. He denied having any skin rash, diarrhea, nausea or vomiting. He has no fever or chills. He denied having any chest pain, shortness of breath, cough or hemoptysis. He has no weight loss or night sweats. He is here today for evaluation before starting cycle #17.  MEDICAL HISTORY: Past Medical History:  Diagnosis Date  . Bronchitis   . Chronic fatigue 09/17/2015  . Lung cancer ([place])    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  . Pneumonia   . Seizures ([place])    last 12/11/14  . Shortness of breath dyspnea    with exertion  . Tobacco abuse     ALLERGIES:  has No Known Allergies.  MEDICATIONS:  Current Outpatient Prescriptions  Medication Sig Dispense Refill    . mirtazapine (REMERON) 30 MG tablet TAKE 1 TABLET (30 MG TOTAL) BY MOUTH AT BEDTIME. 90 tablet 0   No current facility-administered medications for this visit.     SURGICAL HISTORY:  Past Surgical History:  Procedure Laterality Date  . APPLICATION OF CRANIAL NAVIGATION N/A 12/26/2014   Procedure: APPLICATION OF CRANIAL NAVIGATION;  Surgeon: Kevan Ny Ditty, MD;  Location: [facility];  Service: Neurosurgery;  Laterality: N/A;  . CRANIOTOMY N/A 12/26/2014   Procedure: CRANIOTOMY TUMOR EXCISION with BrainLab;  Surgeon: Kevan Ny Ditty, MD;  Location: [facility];  Service: Neurosurgery;  Laterality: N/A;  CRANIOTOMY TUMOR EXCISION with Stealth  . HERNIA REPAIR    . VASECTOMY    . VIDEO ASSISTED THORACOSCOPY (VATS)/WEDGE RESECTION Left 01/14/2015   Procedure: VIDEO ASSISTED THORACOSCOPY (VATS)/WEDGE RESECTION, Superior segmentectomy left  lower lobe, wedge resection of left upper lobe, multiple lymph node disection, On Q insertion.;  Surgeon: Grace Isaac, MD;  Location: [facility];  Service: Thoracic;  Laterality: Left;  [facility] Kitchen VIDEO BRONCHOSCOPY Bilateral 12/16/2014   Procedure: VIDEO BRONCHOSCOPY WITH FLUORO;  Surgeon: Collene Gobble, MD;  Location: [facility];  Service: Cardiopulmonary;  Laterality: Bilateral;  . VIDEO BRONCHOSCOPY N/A 01/14/2015   Procedure: VIDEO BRONCHOSCOPY;  Surgeon: Grace Isaac, MD;  Location: [facility] OR;  Service: Thoracic;  Laterality: N/A;    REVIEW OF SYSTEMS:  A comprehensive review of systems was negative.   PHYSICAL EXAMINATION: General appearance: alert, cooperative and no distress Head: Normocephalic, without obvious abnormality, atraumatic Neck: no adenopathy, no JVD, supple, symmetrical,  trachea midline and thyroid not enlarged, symmetric, no tenderness/mass/nodules Lymph nodes: Cervical, supraclavicular, and axillary nodes normal. Resp: clear to auscultation bilaterally Back: symmetric, no curvature. ROM normal. No CVA tenderness. Cardio:  regular rate and rhythm, S1, S2 normal, no murmur, click, rub or gallop GI: soft, non-tender; bowel sounds normal; no masses,  no organomegaly Extremities: extremities normal, atraumatic, no cyanosis or edema  ECOG PERFORMANCE STATUS: 0 - Asymptomatic  Blood pressure (!) 130/92, pulse 75, temperature 97.7 F (36.5 C), resp. rate 17, height 6' (1.829 m), weight 177 lb 1.6 oz (80.3 kg), SpO2 98 %.  LABORATORY DATA: Lab Results  Component Value Date   WBC 6.9 12/31/2015   HGB 15.3 12/31/2015   HCT 44.7 12/31/2015   MCV 94.1 12/31/2015   PLT 275 12/31/2015      Chemistry      Component Value Date/Time   NA 140 12/10/2015 0839   K 4.3 12/10/2015 0839   CL 109 01/16/2015 0450   CO2 19 (L) 12/10/2015 0839   BUN 13.2 12/10/2015 0839   CREATININE 1.0 12/10/2015 0839      Component Value Date/Time   CALCIUM 9.1 12/10/2015 0839   ALKPHOS 125 12/10/2015 0839   AST 23 12/10/2015 0839   ALT 16 12/10/2015 0839   BILITOT 0.24 12/10/2015 0839       RADIOGRAPHIC STUDIES: Ct Chest W Contrast  Result Date: 12/07/2015 CLINICAL DATA:  Followup left lower lobe non-small cell lung carcinoma. Undergoing immunotherapy. Restaging. EXAM: CT CHEST, ABDOMEN, AND PELVIS WITH CONTRAST TECHNIQUE: Multidetector CT imaging of the chest, abdomen and pelvis was performed following the standard protocol during bolus administration of intravenous contrast. CONTRAST:  124m ISOVUE-300 IOPAMIDOL (ISOVUE-300) INJECTION 61% COMPARISON:  10/06/2015 FINDINGS: CT CHEST FINDINGS Cardiovascular: No acute findings. Mediastinum/Lymph Nodes: No masses or pathologically enlarged lymph nodes identified. Lungs/Pleura: Stable pulmonary emphysema. 6 mm nodular density in posterior right upper lobe on image 41/3 is unchanged in size. Postsurgical changes again seen in left perihilar region. Pulmonary nodule in the superior medial aspect of the left lower lobe measures 13 x 11 mm on image 95/3 compared to 14 x 13 mm previously. 7  mm nodule in the posterior lateral left lung base on image 135/3 remains stable. Probable pleural- parenchymal scarring in the left lower lobe is unchanged. No new or enlarging pulmonary nodules or masses identified. No evidence of pleural effusion. Musculoskeletal: No suspicious bone lesions or other significant abnormality. CT ABDOMEN AND PELVIS FINDINGS Hepatobiliary: No masses identified. Stable tiny sub-cm cyst in segment 6 the right hepatic lobe. Gallbladder is unremarkable. Pancreas:  No mass or inflammatory changes. Spleen:  Within normal limits in size and appearance. Adrenals/Urinary tract: No masses or hydronephrosis. Stable tiny sub-cm left renal cysts. Unremarkable appearance of urinary bladder. Stomach/Bowel: No evidence of obstruction, inflammatory process, or abnormal fluid collections. Sigmoid diverticulosis again noted, without evidence of diverticulitis. Vascular/Lymphatic: No pathologically enlarged lymph nodes identified. No abdominal aortic aneurysm. Aortic atherosclerosis. Reproductive:  No mass or other significant abnormality identified. Other:  None. Musculoskeletal:  No suspicious bone lesions identified. IMPRESSION: Slight decrease in size of infrahilar left lower lobe pulmonary nodule. Other sub-cm pulmonary nodules in the left lower lobe in right upper lobe are stable. No new or progressive disease identified. No evidence of abdominal or pelvic metastatic disease. Incidental findings including aortic atherosclerosis, pulmonary emphysema, and sigmoid diverticulosis. Electronically Signed   By: JEarle GellM.D.   On: 12/07/2015 08:48   Ct Abdomen Pelvis W Contrast  Result Date:  12/07/2015 CLINICAL DATA:  Followup left lower lobe non-small cell lung carcinoma. Undergoing immunotherapy. Restaging. EXAM: CT CHEST, ABDOMEN, AND PELVIS WITH CONTRAST TECHNIQUE: Multidetector CT imaging of the chest, abdomen and pelvis was performed following the standard protocol during bolus  administration of intravenous contrast. CONTRAST:  163m ISOVUE-300 IOPAMIDOL (ISOVUE-300) INJECTION 61% COMPARISON:  10/06/2015 FINDINGS: CT CHEST FINDINGS Cardiovascular: No acute findings. Mediastinum/Lymph Nodes: No masses or pathologically enlarged lymph nodes identified. Lungs/Pleura: Stable pulmonary emphysema. 6 mm nodular density in posterior right upper lobe on image 41/3 is unchanged in size. Postsurgical changes again seen in left perihilar region. Pulmonary nodule in the superior medial aspect of the left lower lobe measures 13 x 11 mm on image 95/3 compared to 14 x 13 mm previously. 7 mm nodule in the posterior lateral left lung base on image 135/3 remains stable. Probable pleural- parenchymal scarring in the left lower lobe is unchanged. No new or enlarging pulmonary nodules or masses identified. No evidence of pleural effusion. Musculoskeletal: No suspicious bone lesions or other significant abnormality. CT ABDOMEN AND PELVIS FINDINGS Hepatobiliary: No masses identified. Stable tiny sub-cm cyst in segment 6 the right hepatic lobe. Gallbladder is unremarkable. Pancreas:  No mass or inflammatory changes. Spleen:  Within normal limits in size and appearance. Adrenals/Urinary tract: No masses or hydronephrosis. Stable tiny sub-cm left renal cysts. Unremarkable appearance of urinary bladder. Stomach/Bowel: No evidence of obstruction, inflammatory process, or abnormal fluid collections. Sigmoid diverticulosis again noted, without evidence of diverticulitis. Vascular/Lymphatic: No pathologically enlarged lymph nodes identified. No abdominal aortic aneurysm. Aortic atherosclerosis. Reproductive:  No mass or other significant abnormality identified. Other:  None. Musculoskeletal:  No suspicious bone lesions identified. IMPRESSION: Slight decrease in size of infrahilar left lower lobe pulmonary nodule. Other sub-cm pulmonary nodules in the left lower lobe in right upper lobe are stable. No new or progressive  disease identified. No evidence of abdominal or pelvic metastatic disease. Incidental findings including aortic atherosclerosis, pulmonary emphysema, and sigmoid diverticulosis. Electronically Signed   By: JEarle GellM.D.   On: 12/07/2015 08:48    ASSESSMENT AND PLAN: This is a very pleasant 56years old white male with stage IV non-small cell lung cancer, adenocarcinoma with solitary brain metastasis. He is status post stereotactic radiotherapy followed by craniotomy and resection of the brain tumor.  His PD L1 expression is 100%. He has no actionable mutations. The patient was started on treatment with immunotherapy with Ketruda (pembrolizumab) status post 16 cycles. He is tolerating the treatment well with no significant adverse effects. I recommended for the patient to proceed with cycle #17 today as scheduled. He would come back for follow-up visit in 3 weeks for evaluation before starting cycle #18. For hypertension, strongly encouraged the patient to reconsult with his primary care physician regarding his persistent hypertension. He was advised to call immediately if he has any concerning symptoms in the interval. The patient voices understanding of current disease status and treatment options and is in agreement with the current care plan.  All questions were answered. The patient knows to call the clinic with any problems, questions or concerns. We can certainly see the patient much sooner if necessary. I spent 10 minutes counseling the patient face to face. The total time spent in the appointment was 15 minutes.  Disclaimer: This note was dictated with voice recognition software. Similar sounding words can inadvertently be transcribed and may not be corrected upon review.

## 2015-12-31 NOTE — Patient Instructions (Signed)
[facility] Discharge Instructions for Patients Receiving Chemotherapy  Today you received the following chemotherapy agents: Keytruda   To help prevent nausea and vomiting after your treatment, we encourage you to take your nausea medication as directed    If you develop nausea and vomiting that is not controlled by your nausea medication, call the clinic.   BELOW ARE SYMPTOMS THAT SHOULD BE REPORTED IMMEDIATELY:  *FEVER GREATER THAN 100.5 F  *CHILLS WITH OR WITHOUT FEVER  NAUSEA AND VOMITING THAT IS NOT CONTROLLED WITH YOUR NAUSEA MEDICATION  *UNUSUAL SHORTNESS OF BREATH  *UNUSUAL BRUISING OR BLEEDING  TENDERNESS IN MOUTH AND THROAT WITH OR WITHOUT PRESENCE OF ULCERS  *URINARY PROBLEMS  *BOWEL PROBLEMS  UNUSUAL RASH Items with * indicate a potential emergency and should be followed up as soon as possible.  Feel free to call the clinic you have any questions or concerns. The clinic phone number is [phone].  Please show the CHEMO ALERT CARD at check-in to the Emergency Department and triage nurse.   

## 2015-12-31 NOTE — Telephone Encounter (Signed)
Patient already on schedule q3w thru February. Gave patient appointment calendar - avs report will be printed in inf area.

## 2016-01-19 ENCOUNTER — Other Ambulatory Visit: Payer: Self-pay | Admitting: Medical Oncology

## 2016-01-19 DIAGNOSIS — C3490 Malignant neoplasm of unspecified part of unspecified bronchus or lung: Secondary | ICD-10-CM

## 2016-01-19 MED ORDER — SODIUM CHLORIDE 0.9 % IV SOLN: 200.0000 mg | INTRAVENOUS | 0 refills | Status: DC

## 2016-01-19 NOTE — Plan of Care (Signed)
rx for pembrolizumab given to Timothy Obrien.

## 2016-01-21 ENCOUNTER — Other Ambulatory Visit (HOSPITAL_BASED_OUTPATIENT_CLINIC_OR_DEPARTMENT_OTHER): Payer: BLUE CROSS/BLUE SHIELD

## 2016-01-21 ENCOUNTER — Telehealth: Payer: Self-pay | Admitting: Internal Medicine

## 2016-01-21 ENCOUNTER — Ambulatory Visit (HOSPITAL_BASED_OUTPATIENT_CLINIC_OR_DEPARTMENT_OTHER): Payer: BLUE CROSS/BLUE SHIELD | Admitting: Internal Medicine

## 2016-01-21 ENCOUNTER — Encounter: Payer: Self-pay | Admitting: Internal Medicine

## 2016-01-21 ENCOUNTER — Ambulatory Visit (HOSPITAL_BASED_OUTPATIENT_CLINIC_OR_DEPARTMENT_OTHER): Payer: BLUE CROSS/BLUE SHIELD

## 2016-01-21 VITALS — BP 122/84 | HR 72 | Temp 98.4°F | Resp 18 | Wt 174.9 lb

## 2016-01-21 DIAGNOSIS — C3432 Malignant neoplasm of lower lobe, left bronchus or lung: Secondary | ICD-10-CM

## 2016-01-21 DIAGNOSIS — Z79899 Other long term (current) drug therapy: Secondary | ICD-10-CM

## 2016-01-21 DIAGNOSIS — C7931 Secondary malignant neoplasm of brain: Secondary | ICD-10-CM

## 2016-01-21 DIAGNOSIS — Z5112 Encounter for antineoplastic immunotherapy: Secondary | ICD-10-CM | POA: Diagnosis not present

## 2016-01-21 LAB — COMPREHENSIVE METABOLIC PANEL
ALK PHOS: 132 U/L (ref 40–150)
ALT: 22 U/L (ref 0–55)
ANION GAP: 9 meq/L (ref 3–11)
AST: 25 U/L (ref 5–34)
Albumin: 3.9 g/dL (ref 3.5–5.0)
BILIRUBIN TOTAL: 0.43 mg/dL (ref 0.20–1.20)
BUN: 20.5 mg/dL (ref 7.0–26.0)
CALCIUM: 9.3 mg/dL (ref 8.4–10.4)
CO2: 21 meq/L — AB (ref 22–29)
CREATININE: 1.1 mg/dL (ref 0.7–1.3)
Chloride: 110 mEq/L — ABNORMAL HIGH (ref 98–109)
EGFR: 78 mL/min/{1.73_m2} — ABNORMAL LOW (ref >90)
Glucose: 83 mg/dl (ref 70–140)
Potassium: 4.7 mEq/L (ref 3.5–5.1)
Sodium: 139 mEq/L (ref 136–145)
TOTAL PROTEIN: 7.3 g/dL (ref 6.4–8.3)

## 2016-01-21 LAB — TSH: TSH: 0.839 m(IU)/L (ref 0.320–4.118)

## 2016-01-21 LAB — CBC WITH DIFFERENTIAL/PLATELET
BASO%: 2.2 % — ABNORMAL HIGH (ref 0.0–2.0)
BASOS ABS: 0.2 10*3/uL — AB (ref 0.0–0.1)
EOS%: 7.2 % — AB (ref 0.0–7.0)
Eosinophils Absolute: 0.5 10*3/uL (ref 0.0–0.5)
HCT: 46.8 % (ref 38.4–49.9)
HEMOGLOBIN: 15.9 g/dL (ref 13.0–17.1)
LYMPH%: 28.6 % (ref 14.0–49.0)
MCH: 32.1 pg (ref 27.2–33.4)
MCHC: 33.9 g/dL (ref 32.0–36.0)
MCV: 94.7 fL (ref 79.3–98.0)
MONO#: 0.7 10*3/uL (ref 0.1–0.9)
MONO%: 9 % (ref 0.0–14.0)
NEUT#: 4 10*3/uL (ref 1.5–6.5)
NEUT%: 53 % (ref 39.0–75.0)
Platelets: 335 10*3/uL (ref 140–400)
RBC: 4.94 10*6/uL (ref 4.20–5.82)
RDW: 13.1 % (ref 11.0–14.6)
WBC: 7.6 10*3/uL (ref 4.0–10.3)
lymph#: 2.2 10*3/uL (ref 0.9–3.3)

## 2016-01-21 MED ORDER — SODIUM CHLORIDE 0.9 % IV SOLN
Freq: Once | INTRAVENOUS | Status: AC
  Administered 2016-01-21: 10:00:00 via INTRAVENOUS

## 2016-01-21 MED ORDER — SODIUM CHLORIDE 0.9 % IV SOLN
200.0000 mg | Freq: Once | INTRAVENOUS | Status: AC
  Administered 2016-01-21: 200 mg via INTRAVENOUS
  Filled 2016-01-21: qty 8

## 2016-01-21 NOTE — Progress Notes (Signed)
[place] [phone]   [phone]  OFFICE PROGRESS NOTE  FRIED, [name], MD [address] [place] [address]  DIAGNOSIS: Stage IV (T2a, N1, M1b) non-small cell lung cancer, adenocarcinoma, negative EGFR mutation but equivocal EGFR amplification, negative ALK gene translocation and negative ROS 1 but with PDL-1 expression 100% presented with left lower lobe lung mass in addition to left hilar adenopathy and solitary metastatic brain lesion diagnosed in December 2016.  PRIOR THERAPY:  1) status post stereotactic radiotherapy and surgical resection diagnosed in December 2016. 2) status post video bronchoscopy with left VATS and wedge resection of the left upper lobe and left lower lobe superior segmentectomy with lymph node dissection under the care of Dr. Servando Snare on 01/14/2015.  CURRENT THERAPY: Immunotherapy with Ketruda (pembrolizumab) 200 MG IV every 3 weeks, first dose 02/18/2015. Status post 17 cycles.  INTERVAL HISTORY: Timothy Obrien 56 y.o. male returns to the clinic today for follow-up visit. The patient is feeling fine today with no specific complaints. He was under a lot of stress recently dealing with the change in his insurance. He has no complaints today. He denied having any chest pain, shortness of breath, cough or hemoptysis. He has no fever or chills. He denied having any nausea, vomiting, diarrhea or constipation. He has no significant weight loss or night sweats. He is here for evaluation before starting cycle #18 of his treatment with immunotherapy.  MEDICAL HISTORY: Past Medical History:  Diagnosis Date  . Bronchitis   . Chronic fatigue 09/17/2015  . Hypertension 12/31/2015  . Lung cancer ([place])    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  . Pneumonia   . Seizures ([place])    last 12/11/14  . Shortness of breath dyspnea    with exertion  . Tobacco abuse     ALLERGIES:  has No Known Allergies.  MEDICATIONS:    Current Outpatient Prescriptions  Medication Sig Dispense Refill  . mirtazapine (REMERON) 30 MG tablet TAKE 1 TABLET (30 MG TOTAL) BY MOUTH AT BEDTIME. 90 tablet 0  . sodium chloride 0.9 % SOLN 50 mL with pembrolizumab 100 MG/4ML SOLN 200 mg Inject 200 mg into the vein every 21 ( twenty-one) days. 12 Dose 0   No current facility-administered medications for this visit.     SURGICAL HISTORY:  Past Surgical History:  Procedure Laterality Date  . APPLICATION OF CRANIAL NAVIGATION N/A 12/26/2014   Procedure: APPLICATION OF CRANIAL NAVIGATION;  Surgeon: Kevan Ny Ditty, MD;  Location: [facility];  Service: Neurosurgery;  Laterality: N/A;  . CRANIOTOMY N/A 12/26/2014   Procedure: CRANIOTOMY TUMOR EXCISION with BrainLab;  Surgeon: Kevan Ny Ditty, MD;  Location: [facility];  Service: Neurosurgery;  Laterality: N/A;  CRANIOTOMY TUMOR EXCISION with Stealth  . HERNIA REPAIR    . VASECTOMY    . VIDEO ASSISTED THORACOSCOPY (VATS)/WEDGE RESECTION Left 01/14/2015   Procedure: VIDEO ASSISTED THORACOSCOPY (VATS)/WEDGE RESECTION, Superior segmentectomy left  lower lobe, wedge resection of left upper lobe, multiple lymph node disection, On Q insertion.;  Surgeon: Grace Isaac, MD;  Location: [facility];  Service: Thoracic;  Laterality: Left;  Marland Kitchen VIDEO BRONCHOSCOPY Bilateral 12/16/2014   Procedure: VIDEO BRONCHOSCOPY WITH FLUORO;  Surgeon: Collene Gobble, MD;  Location: [facility];  Service: Cardiopulmonary;  Laterality: Bilateral;  . VIDEO BRONCHOSCOPY N/A 01/14/2015   Procedure: VIDEO BRONCHOSCOPY;  Surgeon: Grace Isaac, MD;  Location: [facility];  Service: Thoracic;  Laterality: N/A;    REVIEW OF SYSTEMS:  A comprehensive review of systems was negative.   PHYSICAL EXAMINATION: General appearance: alert, cooperative and no distress Head: Normocephalic, without obvious abnormality, atraumatic Neck: no adenopathy, no JVD, supple, symmetrical, trachea midline and thyroid not enlarged,  symmetric, no tenderness/mass/nodules Lymph nodes: Cervical, supraclavicular, and axillary nodes normal. Resp: clear to auscultation bilaterally Back: symmetric, no curvature. ROM normal. No CVA tenderness. Cardio: regular rate and rhythm, S1, S2 normal, no murmur, click, rub or gallop GI: soft, non-tender; bowel sounds normal; no masses,  no organomegaly Extremities: extremities normal, atraumatic, no cyanosis or edema  ECOG PERFORMANCE STATUS: 0 - Asymptomatic  There were no vitals taken for this visit.  LABORATORY DATA: Lab Results  Component Value Date   WBC 6.9 12/31/2015   HGB 15.3 12/31/2015   HCT 44.7 12/31/2015   MCV 94.1 12/31/2015   PLT 275 12/31/2015      Chemistry      Component Value Date/Time   NA 139 12/31/2015 1146   K 4.1 12/31/2015 1146   CL 109 01/16/2015 0450   CO2 18 (L) 12/31/2015 1146   BUN 15.7 12/31/2015 1146   CREATININE 1.0 12/31/2015 1146      Component Value Date/Time   CALCIUM 9.0 12/31/2015 1146   ALKPHOS 137 12/31/2015 1146   AST 23 12/31/2015 1146   ALT 19 12/31/2015 1146   BILITOT 0.42 12/31/2015 1146       RADIOGRAPHIC STUDIES: No results found.  ASSESSMENT AND PLAN:  This is a very pleasant 56 years old white male with a stage IV non-small cell lung cancer, adenocarcinoma with solitary brain metastasis status post stereotactic radiotherapy followed by surgical resection of the brain tumor as well as surgical resection of the lung mass. He is currently undergoing treatment with immunotherapy with Ketruda (pembrolizumab) status post 17 cycles. Has been tolerating his treatment well with no significant adverse effects. I recommended for the patient to proceed with cycle #18 today as a scheduled. He will come back for follow-up visit in 3 weeks after repeating CT scan of the chest, abdomen and pelvis for restaging of his disease. He was advised to call if he has any concerning symptoms in the interval. The patient voices  understanding of current disease status and treatment options and is in agreement with the current care plan.  All questions were answered. The patient knows to call the clinic with any problems, questions or concerns. We can certainly see the patient much sooner if necessary. I spent 10 minutes counseling the patient face to face. The total time spent in the appointment was 15 minutes.   Disclaimer: This note was dictated with voice recognition software. Similar sounding words can inadvertently be transcribed and may not be corrected upon review.

## 2016-01-21 NOTE — Patient Instructions (Signed)
[facility] Discharge Instructions for Patients Receiving Chemotherapy  Today you received the following chemotherapy agents: Keytruda   To help prevent nausea and vomiting after your treatment, we encourage you to take your nausea medication as directed    If you develop nausea and vomiting that is not controlled by your nausea medication, call the clinic.   BELOW ARE SYMPTOMS THAT SHOULD BE REPORTED IMMEDIATELY:  *FEVER GREATER THAN 100.5 F  *CHILLS WITH OR WITHOUT FEVER  NAUSEA AND VOMITING THAT IS NOT CONTROLLED WITH YOUR NAUSEA MEDICATION  *UNUSUAL SHORTNESS OF BREATH  *UNUSUAL BRUISING OR BLEEDING  TENDERNESS IN MOUTH AND THROAT WITH OR WITHOUT PRESENCE OF ULCERS  *URINARY PROBLEMS  *BOWEL PROBLEMS  UNUSUAL RASH Items with * indicate a potential emergency and should be followed up as soon as possible.  Feel free to call the clinic you have any questions or concerns. The clinic phone number is [phone].  Please show the CHEMO ALERT CARD at check-in to the Emergency Department and triage nurse.   

## 2016-01-21 NOTE — Telephone Encounter (Signed)
Gave patient avs report and appointments for February. Central radiology will call re ct.

## 2016-02-08 ENCOUNTER — Other Ambulatory Visit: Payer: Self-pay | Admitting: Internal Medicine

## 2016-02-09 ENCOUNTER — Ambulatory Visit (HOSPITAL_COMMUNITY)
Admission: RE | Admit: 2016-02-09 | Discharge: 2016-02-09 | Disposition: A | Discharge status: Home / Self Care | Payer: BLUE CROSS/BLUE SHIELD | Source: Ambulatory Visit | Attending: Internal Medicine | Admitting: Internal Medicine

## 2016-02-09 ENCOUNTER — Encounter (HOSPITAL_COMMUNITY): Payer: Self-pay

## 2016-02-09 DIAGNOSIS — C3432 Malignant neoplasm of lower lobe, left bronchus or lung: Secondary | ICD-10-CM | POA: Diagnosis not present

## 2016-02-09 DIAGNOSIS — I700 Atherosclerosis of aorta: Secondary | ICD-10-CM | POA: Insufficient documentation

## 2016-02-09 DIAGNOSIS — C7931 Secondary malignant neoplasm of brain: Secondary | ICD-10-CM | POA: Diagnosis present

## 2016-02-09 DIAGNOSIS — Z5112 Encounter for antineoplastic immunotherapy: Secondary | ICD-10-CM | POA: Diagnosis present

## 2016-02-09 MED ORDER — SODIUM CHLORIDE 0.9 % IJ SOLN
INTRAMUSCULAR | Status: AC
  Filled 2016-02-09: qty 50

## 2016-02-09 MED ORDER — IOPAMIDOL (ISOVUE-300) INJECTION 61%
INTRAVENOUS | Status: AC
  Filled 2016-02-09: qty 100

## 2016-02-09 MED ORDER — IOPAMIDOL (ISOVUE-300) INJECTION 61%
100.0000 mL | Freq: Once | INTRAVENOUS | Status: AC | PRN
  Administered 2016-02-09: 100 mL via INTRAVENOUS

## 2016-02-11 ENCOUNTER — Telehealth: Payer: Self-pay | Admitting: Internal Medicine

## 2016-02-11 ENCOUNTER — Ambulatory Visit (HOSPITAL_BASED_OUTPATIENT_CLINIC_OR_DEPARTMENT_OTHER): Payer: BLUE CROSS/BLUE SHIELD | Admitting: Internal Medicine

## 2016-02-11 ENCOUNTER — Ambulatory Visit (HOSPITAL_BASED_OUTPATIENT_CLINIC_OR_DEPARTMENT_OTHER): Payer: BLUE CROSS/BLUE SHIELD

## 2016-02-11 ENCOUNTER — Telehealth: Payer: Self-pay | Admitting: *Deleted

## 2016-02-11 ENCOUNTER — Encounter: Payer: Self-pay | Admitting: Internal Medicine

## 2016-02-11 ENCOUNTER — Other Ambulatory Visit (HOSPITAL_BASED_OUTPATIENT_CLINIC_OR_DEPARTMENT_OTHER): Payer: BLUE CROSS/BLUE SHIELD

## 2016-02-11 VITALS — BP 134/87 | HR 78 | Temp 98.6°F | Resp 18 | Ht 72.0 in | Wt 177.6 lb

## 2016-02-11 DIAGNOSIS — R531 Weakness: Secondary | ICD-10-CM

## 2016-02-11 DIAGNOSIS — C3432 Malignant neoplasm of lower lobe, left bronchus or lung: Secondary | ICD-10-CM

## 2016-02-11 DIAGNOSIS — K0889 Other specified disorders of teeth and supporting structures: Secondary | ICD-10-CM | POA: Diagnosis not present

## 2016-02-11 DIAGNOSIS — E440 Moderate protein-calorie malnutrition: Secondary | ICD-10-CM

## 2016-02-11 DIAGNOSIS — Z9889 Other specified postprocedural states: Secondary | ICD-10-CM

## 2016-02-11 DIAGNOSIS — Z720 Tobacco use: Secondary | ICD-10-CM

## 2016-02-11 DIAGNOSIS — Z5112 Encounter for antineoplastic immunotherapy: Secondary | ICD-10-CM | POA: Diagnosis not present

## 2016-02-11 DIAGNOSIS — C7931 Secondary malignant neoplasm of brain: Secondary | ICD-10-CM

## 2016-02-11 DIAGNOSIS — F329 Major depressive disorder, single episode, unspecified: Secondary | ICD-10-CM

## 2016-02-11 DIAGNOSIS — F32A Depression, unspecified: Secondary | ICD-10-CM | POA: Insufficient documentation

## 2016-02-11 HISTORY — DX: Depression, unspecified: F32.A

## 2016-02-11 LAB — CBC WITH DIFFERENTIAL/PLATELET
BASO%: 0.2 % (ref 0.0–2.0)
Basophils Absolute: 0 10*3/uL (ref 0.0–0.1)
EOS ABS: 0.2 10*3/uL (ref 0.0–0.5)
EOS%: 2 % (ref 0.0–7.0)
HCT: 45.1 % (ref 38.4–49.9)
HGB: 16.1 g/dL (ref 13.0–17.1)
LYMPH%: 19.9 % (ref 14.0–49.0)
MCH: 32.1 pg (ref 27.2–33.4)
MCHC: 35.7 g/dL (ref 32.0–36.0)
MCV: 89.8 fL (ref 79.3–98.0)
MONO#: 1.3 10*3/uL — ABNORMAL HIGH (ref 0.1–0.9)
MONO%: 10.7 % (ref 0.0–14.0)
NEUT%: 67.2 % (ref 39.0–75.0)
NEUTROS ABS: 8.1 10*3/uL — AB (ref 1.5–6.5)
Platelets: 239 10*3/uL (ref 140–400)
RBC: 5.02 10*6/uL (ref 4.20–5.82)
RDW: 12.9 % (ref 11.0–14.6)
WBC: 12 10*3/uL — AB (ref 4.0–10.3)
lymph#: 2.4 10*3/uL (ref 0.9–3.3)

## 2016-02-11 LAB — COMPREHENSIVE METABOLIC PANEL
ALT: 16 U/L (ref 0–55)
AST: 22 U/L (ref 5–34)
Albumin: 4 g/dL (ref 3.5–5.0)
Alkaline Phosphatase: 128 U/L (ref 40–150)
Anion Gap: 10 mEq/L (ref 3–11)
BILIRUBIN TOTAL: 0.49 mg/dL (ref 0.20–1.20)
BUN: 11.8 mg/dL (ref 7.0–26.0)
CHLORIDE: 107 meq/L (ref 98–109)
CO2: 19 meq/L — AB (ref 22–29)
Calcium: 9.2 mg/dL (ref 8.4–10.4)
Creatinine: 1 mg/dL (ref 0.7–1.3)
EGFR: 88 mL/min/{1.73_m2} — AB (ref >90)
GLUCOSE: 100 mg/dL (ref 70–140)
Potassium: 4.3 mEq/L (ref 3.5–5.1)
SODIUM: 137 meq/L (ref 136–145)
TOTAL PROTEIN: 7.4 g/dL (ref 6.4–8.3)

## 2016-02-11 MED ORDER — SODIUM CHLORIDE 0.9 % IV SOLN
200.0000 mg | Freq: Once | INTRAVENOUS | Status: AC
  Administered 2016-02-11: 200 mg via INTRAVENOUS
  Filled 2016-02-11: qty 8

## 2016-02-11 MED ORDER — SODIUM CHLORIDE 0.9 % IV SOLN
Freq: Once | INTRAVENOUS | Status: AC
  Administered 2016-02-11: 10:00:00 via INTRAVENOUS

## 2016-02-11 NOTE — Telephone Encounter (Signed)
Appointments scheduled per 02/11/16 los. Patient was given a copy of the appointment schedule and AVS report per 02/11/16 los.

## 2016-02-11 NOTE — Telephone Encounter (Signed)
Per 2/1 LOS and staff message I have scheduled appts and gave calendar. Notified the scheduler.

## 2016-02-11 NOTE — Patient Instructions (Signed)
[facility] Discharge Instructions for Patients Receiving Chemotherapy  Today you received the following chemotherapy agents:  Keytruda.  To help prevent nausea and vomiting after your treatment, we encourage you to take your nausea medication as prescribed.   If you develop nausea and vomiting that is not controlled by your nausea medication, call the clinic.   BELOW ARE SYMPTOMS THAT SHOULD BE REPORTED IMMEDIATELY:  *FEVER GREATER THAN 100.5 F  *CHILLS WITH OR WITHOUT FEVER  NAUSEA AND VOMITING THAT IS NOT CONTROLLED WITH YOUR NAUSEA MEDICATION  *UNUSUAL SHORTNESS OF BREATH  *UNUSUAL BRUISING OR BLEEDING  TENDERNESS IN MOUTH AND THROAT WITH OR WITHOUT PRESENCE OF ULCERS  *URINARY PROBLEMS  *BOWEL PROBLEMS  UNUSUAL RASH Items with * indicate a potential emergency and should be followed up as soon as possible.  Feel free to call the clinic you have any questions or concerns. The clinic phone number is [phone].  Please show the CHEMO ALERT CARD at check-in to the Emergency Department and triage nurse.   

## 2016-02-11 NOTE — Telephone Encounter (Signed)
Message sent to chemo scheduler to be added per 02/11/16 los. Appointments scheduled per 02/11/16 los. Patient was given a copy of the appointment schedule and AVS report per 02/11/16 los.

## 2016-02-11 NOTE — Progress Notes (Signed)
[facility] [phone]   Fax:([phone]  OFFICE PROGRESS NOTE  FRIED, [name], MD [address] [place] [address]  DIAGNOSIS:  Stage IV (T2a, N1, M1b) non-small cell lung cancer, adenocarcinoma, negative EGFR mutation but equivocal EGFR amplification, negative ALK gene translocation and negative ROS 1 but with PDL-1 expression 100% presented with left lower lobe lung mass in addition to left hilar adenopathy and solitary metastatic brain lesion diagnosed in December 2016.  PRIOR THERAPY: 1) status post stereotactic radiotherapy and surgical resection diagnosed in December 2016. 2) status post video bronchoscopy with left VATS and wedge resection of the left upper lobe and left lower lobe superior segmentectomy with lymph node dissection under the care of Dr. Servando Snare on 01/14/2015.  CURRENT THERAPY: First-line treatment with immunotherapy with Ketruda (pembrolizumab) 200 mg IV every 3 weeks status post 18 cycles. First cycle was given 02/18/2015.  INTERVAL HISTORY: Timothy Obrien 56 y.o. male returns to the clinic today for follow-up visit. The patient is currently on treatment with immunotherapy with Ketruda (pembrolizumab) status post 18 cycles and he is tolerating his treatment well with no significant adverse effects. He has no fever or chills. He has no chest pain, shortness breath, cough or hemoptysis. He denied having any weight loss or night sweats. He has no nausea, vomiting, diarrhea or constipation. He has a broken tooth and he is seeing his dentist after the infusion today for evaluation and temporary crown. The patient had repeat CT scan of the chest performed recently and he is here for evaluation and discussion of his scan results and treatment options.  MEDICAL HISTORY: Past Medical History:  Diagnosis Date  . Bronchitis   . Chronic fatigue 09/17/2015  . Hypertension 12/31/2015  . Lung cancer ([facility])    non small cell lung ca  with brain met  . Pneumonia   . Seizures ([facility])    last 12/11/14  . Shortness of breath dyspnea    with exertion  . Tobacco abuse     ALLERGIES:  has No Known Allergies.  MEDICATIONS:  Current Outpatient Prescriptions  Medication Sig Dispense Refill  . ibuprofen (ADVIL,MOTRIN) 800 MG tablet     . mirtazapine (REMERON) 30 MG tablet TAKE 1 TABLET BY MOUTH AT BEDTIME 90 tablet 0  . sodium chloride 0.9 % SOLN 50 mL with pembrolizumab 100 MG/4ML SOLN 200 mg Inject 200 mg into the vein every 21 ( twenty-one) days. 12 Dose 0   No current facility-administered medications for this visit.     SURGICAL HISTORY:  Past Surgical History:  Procedure Laterality Date  . APPLICATION OF CRANIAL NAVIGATION N/A 12/26/2014   Procedure: APPLICATION OF CRANIAL NAVIGATION;  Surgeon: Kevan Ny Ditty, MD;  Location: [facility];  Service: Neurosurgery;  Laterality: N/A;  . CRANIOTOMY N/A 12/26/2014   Procedure: CRANIOTOMY TUMOR EXCISION with BrainLab;  Surgeon: Kevan Ny Ditty, MD;  Location: [facility];  Service: Neurosurgery;  Laterality: N/A;  CRANIOTOMY TUMOR EXCISION with Stealth  . HERNIA REPAIR    . VASECTOMY    . VIDEO ASSISTED THORACOSCOPY (VATS)/WEDGE RESECTION Left 01/14/2015   Procedure: VIDEO ASSISTED THORACOSCOPY (VATS)/WEDGE RESECTION, Superior segmentectomy left  lower lobe, wedge resection of left upper lobe, multiple lymph node disection, On Q insertion.;  Surgeon: Grace Isaac, MD;  Location: [facility];  Service: Thoracic;  Laterality: Left;  Marland Kitchen VIDEO BRONCHOSCOPY Bilateral 12/16/2014   Procedure: VIDEO BRONCHOSCOPY WITH FLUORO;  Surgeon: Rose Fillers  Lamonte Sakai, MD;  Location: [facility];  Service: Cardiopulmonary;  Laterality: Bilateral;  . VIDEO BRONCHOSCOPY N/A 01/14/2015   Procedure: VIDEO BRONCHOSCOPY;  Surgeon: Grace Isaac, MD;  Location: [facility];  Service: Thoracic;  Laterality: N/A;    REVIEW OF SYSTEMS:  Constitutional: negative Eyes: negative Ears, nose, mouth, throat,  and face: positive for Dental ache Respiratory: negative Cardiovascular: negative Gastrointestinal: negative Genitourinary:negative Integument/breast: negative Hematologic/lymphatic: negative Musculoskeletal:negative Neurological: negative Behavioral/Psych: negative Endocrine: negative Allergic/Immunologic: negative   PHYSICAL EXAMINATION: General appearance: alert, cooperative and no distress Head: Normocephalic, without obvious abnormality, atraumatic Neck: no adenopathy, no JVD, supple, symmetrical, trachea midline and thyroid not enlarged, symmetric, no tenderness/mass/nodules Lymph nodes: Cervical, supraclavicular, and axillary nodes normal. Resp: clear to auscultation bilaterally Back: symmetric, no curvature. ROM normal. No CVA tenderness. Cardio: regular rate and rhythm, S1, S2 normal, no murmur, click, rub or gallop GI: soft, non-tender; bowel sounds normal; no masses,  no organomegaly Extremities: extremities normal, atraumatic, no cyanosis or edema Neurologic: Alert and oriented X 3, normal strength and tone. Normal symmetric reflexes. Normal coordination and gait  ECOG PERFORMANCE STATUS: 0 - Asymptomatic  Blood pressure 134/87, pulse 78, temperature 98.6 F (37 C), temperature source Oral, resp. rate 18, height 6' (1.829 m), weight 177 lb 9.6 oz (80.6 kg), SpO2 98 %.  LABORATORY DATA: Lab Results  Component Value Date   WBC 12.0 (H) 02/11/2016   HGB 16.1 02/11/2016   HCT 45.1 02/11/2016   MCV 89.8 02/11/2016   PLT 239 02/11/2016      Chemistry      Component Value Date/Time   NA 137 02/11/2016 0749   K 4.3 02/11/2016 0749   CL 109 01/16/2015 0450   CO2 19 (L) 02/11/2016 0749   BUN 11.8 02/11/2016 0749   CREATININE 1.0 02/11/2016 0749      Component Value Date/Time   CALCIUM 9.2 02/11/2016 0749   ALKPHOS 128 02/11/2016 0749   AST 22 02/11/2016 0749   ALT 16 02/11/2016 0749   BILITOT 0.49 02/11/2016 0749       RADIOGRAPHIC STUDIES: Ct Chest W  Contrast  Result Date: 02/09/2016 CLINICAL DATA:  Lung carcinoma with brain metastasis diagnosed December 2016. Keytruda immunotherapy in progress. Radiation therapy complete. EXAM: CT CHEST, ABDOMEN, AND PELVIS WITH CONTRAST TECHNIQUE: Multidetector CT imaging of the chest, abdomen and pelvis was performed following the standard protocol during bolus administration of intravenous contrast. CONTRAST:  120m ISOVUE-300 IOPAMIDOL (ISOVUE-300) INJECTION 61% COMPARISON:  12/07/2015 FINDINGS: CT CHEST FINDINGS Cardiovascular: No significant vascular findings. Normal heart size. No pericardial effusion. Mediastinum/Nodes: No axillary or supraclavicular adenopathy. No mediastinal adenopathy. Esophagus normal. Lungs/Pleura: Centrilobular emphysema in the upper lobes. Postsurgical change LEFT upper lobe. Nodular pleural-parenchymal thickening inferior to the surgical site measures 13 mm by 13 mm (image 99, series 6). This compares to 13 mm x 11 mm on comparison exam for no interval change. No new pulmonary nodules. Musculoskeletal: No aggressive osseous lesion. CT ABDOMEN AND PELVIS FINDINGS Hepatobiliary: No focal hepatic lesion. No biliary ductal dilatation. Gallbladder is normal. Common bile duct is normal. Pancreas: Pancreas is normal. No ductal dilatation. No pancreatic inflammation. Spleen: Normal spleen Adrenals/urinary tract: Adrenal glands and kidneys are normal. The ureters and bladder normal. Stomach/Bowel: Stomach, small-bowel, appendix, cecum normal. There are diverticula of the sigmoid colon without acute inflammation. Rectum normal Vascular/Lymphatic: Abdominal aorta is normal caliber. There is no retroperitoneal or periportal lymphadenopathy. No pelvic lymphadenopathy. Reproductive: Prostate normal Other: No peritoneal metastasis Musculoskeletal: No aggressive osseous lesion. IMPRESSION: Chest  Impression: 1. Stable nodular thickening inferior to the surgical site in the LEFT lower lobe. 2. No evidence new  malignancy in the thorax. Abdomen / Pelvis Impression: 1. No evidence of metastatic disease in the abdomen pelvis. 2.  Atherosclerotic calcification of the aorta. Electronically Signed   By: Suzy Bouchard M.D.   On: 02/09/2016 10:58   Ct Abdomen Pelvis W Contrast  Result Date: 02/09/2016 CLINICAL DATA:  Lung carcinoma with brain metastasis diagnosed December 2016. Keytruda immunotherapy in progress. Radiation therapy complete. EXAM: CT CHEST, ABDOMEN, AND PELVIS WITH CONTRAST TECHNIQUE: Multidetector CT imaging of the chest, abdomen and pelvis was performed following the standard protocol during bolus administration of intravenous contrast. CONTRAST:  12m ISOVUE-300 IOPAMIDOL (ISOVUE-300) INJECTION 61% COMPARISON:  12/07/2015 FINDINGS: CT CHEST FINDINGS Cardiovascular: No significant vascular findings. Normal heart size. No pericardial effusion. Mediastinum/Nodes: No axillary or supraclavicular adenopathy. No mediastinal adenopathy. Esophagus normal. Lungs/Pleura: Centrilobular emphysema in the upper lobes. Postsurgical change LEFT upper lobe. Nodular pleural-parenchymal thickening inferior to the surgical site measures 13 mm by 13 mm (image 99, series 6). This compares to 13 mm x 11 mm on comparison exam for no interval change. No new pulmonary nodules. Musculoskeletal: No aggressive osseous lesion. CT ABDOMEN AND PELVIS FINDINGS Hepatobiliary: No focal hepatic lesion. No biliary ductal dilatation. Gallbladder is normal. Common bile duct is normal. Pancreas: Pancreas is normal. No ductal dilatation. No pancreatic inflammation. Spleen: Normal spleen Adrenals/urinary tract: Adrenal glands and kidneys are normal. The ureters and bladder normal. Stomach/Bowel: Stomach, small-bowel, appendix, cecum normal. There are diverticula of the sigmoid colon without acute inflammation. Rectum normal Vascular/Lymphatic: Abdominal aorta is normal caliber. There is no retroperitoneal or periportal lymphadenopathy. No pelvic  lymphadenopathy. Reproductive: Prostate normal Other: No peritoneal metastasis Musculoskeletal: No aggressive osseous lesion. IMPRESSION: Chest Impression: 1. Stable nodular thickening inferior to the surgical site in the LEFT lower lobe. 2. No evidence new malignancy in the thorax. Abdomen / Pelvis Impression: 1. No evidence of metastatic disease in the abdomen pelvis. 2.  Atherosclerotic calcification of the aorta. Electronically Signed   By: SSuzy BouchardM.D.   On: 02/09/2016 10:58    ASSESSMENT AND PLAN:  This is a very pleasant 56years old white male with metastatic non-small cell lung cancer, adenocarcinoma with positive PDL 1 expression of 100% The patient is currently undergoing treatment with Ketruda (pembrolizumab) 200 mg IV every 3 weeks, status post 18 cycles. He is tolerating the treatment well. He had repeat CT scan of the chest, abdomen and pelvis performed recently. I personally and independently reviewed the scan images and discuss the results with the patient today. I recommended for him to continue his treatment with KHungary(pembrolizumab) with the same dose and he will start cycle #19 today. He would come back for follow-up visit in 3 weeks for evaluation before starting cycle #20. For the dental ache and broken tooth, the patient will see his dentist later today for evaluation and management. He is also currently on ibuprofen. For the depression, the patient will continue on Remeron. He was advised to call immediately if he has any concerning symptoms in the interval. The patient voices understanding of current disease status and treatment options and is in agreement with the current care plan.  All questions were answered. The patient knows to call the clinic with any problems, questions or concerns. We can certainly see the patient much sooner if necessary.  I spent 15 minutes counseling the patient face to face. The total time spent in  the appointment was 25  minutes.  Disclaimer: This note was dictated with voice recognition software. Similar sounding words can inadvertently be transcribed and may not be corrected upon review.

## 2016-02-26 ENCOUNTER — Encounter: Payer: Self-pay | Admitting: Internal Medicine

## 2016-02-26 NOTE — Progress Notes (Signed)
Pt is approved w/ [facility] for [employer] from 01/11/16 to 01/09/17 for $25,000.  I emailed approval letter to Lilian Coma & Elmyra Ricks in billing and gave a copy to HIM to scan in pt's chart.

## 2016-03-03 ENCOUNTER — Ambulatory Visit (HOSPITAL_BASED_OUTPATIENT_CLINIC_OR_DEPARTMENT_OTHER): Payer: BLUE CROSS/BLUE SHIELD

## 2016-03-03 ENCOUNTER — Telehealth: Payer: Self-pay | Admitting: Internal Medicine

## 2016-03-03 ENCOUNTER — Ambulatory Visit (HOSPITAL_BASED_OUTPATIENT_CLINIC_OR_DEPARTMENT_OTHER): Payer: BLUE CROSS/BLUE SHIELD | Admitting: Internal Medicine

## 2016-03-03 ENCOUNTER — Other Ambulatory Visit (HOSPITAL_BASED_OUTPATIENT_CLINIC_OR_DEPARTMENT_OTHER): Payer: BLUE CROSS/BLUE SHIELD

## 2016-03-03 ENCOUNTER — Encounter: Payer: Self-pay | Admitting: Internal Medicine

## 2016-03-03 VITALS — BP 123/84 | HR 70 | Temp 98.1°F | Resp 18 | Ht 72.0 in | Wt 177.0 lb

## 2016-03-03 DIAGNOSIS — C7931 Secondary malignant neoplasm of brain: Secondary | ICD-10-CM

## 2016-03-03 DIAGNOSIS — C3432 Malignant neoplasm of lower lobe, left bronchus or lung: Secondary | ICD-10-CM

## 2016-03-03 DIAGNOSIS — Z5112 Encounter for antineoplastic immunotherapy: Secondary | ICD-10-CM

## 2016-03-03 LAB — COMPREHENSIVE METABOLIC PANEL
ALBUMIN: 3.8 g/dL (ref 3.5–5.0)
ALK PHOS: 112 U/L (ref 40–150)
ALT: 27 U/L (ref 0–55)
AST: 26 U/L (ref 5–34)
Anion Gap: 10 mEq/L (ref 3–11)
BILIRUBIN TOTAL: 0.37 mg/dL (ref 0.20–1.20)
BUN: 20 mg/dL (ref 7.0–26.0)
CO2: 17 mEq/L — ABNORMAL LOW (ref 22–29)
CREATININE: 1 mg/dL (ref 0.7–1.3)
Calcium: 8.8 mg/dL (ref 8.4–10.4)
Chloride: 112 mEq/L — ABNORMAL HIGH (ref 98–109)
EGFR: 86 mL/min/{1.73_m2} — AB (ref >90)
GLUCOSE: 95 mg/dL (ref 70–140)
Potassium: 4.3 mEq/L (ref 3.5–5.1)
Sodium: 139 mEq/L (ref 136–145)
TOTAL PROTEIN: 6.9 g/dL (ref 6.4–8.3)

## 2016-03-03 LAB — CBC WITH DIFFERENTIAL/PLATELET
BASO%: 1.1 % (ref 0.0–2.0)
Basophils Absolute: 0.1 10*3/uL (ref 0.0–0.1)
EOS ABS: 0.4 10*3/uL (ref 0.0–0.5)
EOS%: 5 % (ref 0.0–7.0)
HCT: 44.7 % (ref 38.4–49.9)
HEMOGLOBIN: 15.2 g/dL (ref 13.0–17.1)
LYMPH%: 33.7 % (ref 14.0–49.0)
MCH: 31.9 pg (ref 27.2–33.4)
MCHC: 34 g/dL (ref 32.0–36.0)
MCV: 93.9 fL (ref 79.3–98.0)
MONO#: 0.8 10*3/uL (ref 0.1–0.9)
MONO%: 10.9 % (ref 0.0–14.0)
NEUT%: 49.3 % (ref 39.0–75.0)
NEUTROS ABS: 3.7 10*3/uL (ref 1.5–6.5)
PLATELETS: 256 10*3/uL (ref 140–400)
RBC: 4.76 10*6/uL (ref 4.20–5.82)
RDW: 12.8 % (ref 11.0–14.6)
WBC: 7.4 10*3/uL (ref 4.0–10.3)
lymph#: 2.5 10*3/uL (ref 0.9–3.3)

## 2016-03-03 MED ORDER — SODIUM CHLORIDE 0.9 % IV SOLN
200.0000 mg | Freq: Once | INTRAVENOUS | Status: AC
  Administered 2016-03-03: 200 mg via INTRAVENOUS
  Filled 2016-03-03: qty 8

## 2016-03-03 MED ORDER — SODIUM CHLORIDE 0.9 % IV SOLN
Freq: Once | INTRAVENOUS | Status: AC
  Administered 2016-03-03: 10:00:00 via INTRAVENOUS

## 2016-03-03 NOTE — Telephone Encounter (Signed)
Lab, chemo and follow up with Dr Julien Nordmann was scheduled for 05/05/16, per 03/03/16 los. Patient was given a copy of the AVS report and appointment schedule per 03/03/16 los.

## 2016-03-03 NOTE — Progress Notes (Signed)
[facility] Telephone:([phone]   Fax:([phone]) [phone]  OFFICE PROGRESS NOTE  FRIED, Timothy Graff, MD [address] [place] [address]  DIAGNOSIS:  Stage IV (T2a, N1, M1b) non-small cell lung cancer, adenocarcinoma, negative EGFR mutation but equivocal EGFR amplification, negative ALK gene translocation and negative ROS 1 but with PDL-1 expression 100% presented with left lower lobe lung mass in addition to left hilar adenopathy and solitary metastatic brain lesion diagnosed in December 2016.  PRIOR THERAPY: 1) status post stereotactic radiotherapy and surgical resection diagnosed in December 2016. 2) status post video bronchoscopy with left VATS and wedge resection of the left upper lobe and left lower lobe superior segmentectomy with lymph node dissection under the care of Dr. Servando Obrien on 01/14/2015.  CURRENT THERAPY: First-line treatment with immunotherapy with Ketruda (pembrolizumab) 200 mg IV every 3 weeks status post 19 cycles. First cycle was given 02/18/2015.  INTERVAL HISTORY: Timothy Obrien 56 y.o. male returns to the clinic today for follow-up visit. The patient is currently on treatment with Ketruda (pembrolizumab) every 3 weeks status post 19 cycles and tolerating his treatment well. He denied having any chest pain, shortness of breath, cough or hemoptysis. He has no fever or chills. He denied having any weight loss or night sweats. He has no nausea, vomiting, diarrhea or constipation. He is here today for evaluation before starting cycle #20.  MEDICAL HISTORY: Past Medical History:  Diagnosis Date  . Bronchitis   . Chronic fatigue 09/17/2015  . Depression 02/11/2016  . Hypertension 12/31/2015  . Lung cancer ([facility])    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  . Pneumonia   . Seizures ([facility])    last 12/11/14  . Shortness of breath dyspnea    with exertion  . Tobacco abuse     ALLERGIES:  has No Known Allergies.  MEDICATIONS:  Current  Outpatient Prescriptions  Medication Sig Dispense Refill  . ibuprofen (ADVIL,MOTRIN) 800 MG tablet     . mirtazapine (REMERON) 30 MG tablet TAKE 1 TABLET BY MOUTH AT BEDTIME 90 tablet 0  . sodium chloride 0.9 % SOLN 50 mL with pembrolizumab 100 MG/4ML SOLN 200 mg Inject 200 mg into the vein every 21 ( twenty-one) days. 12 Dose 0   No current facility-administered medications for this visit.     SURGICAL HISTORY:  Past Surgical History:  Procedure Laterality Date  . APPLICATION OF CRANIAL NAVIGATION N/A 12/26/2014   Procedure: APPLICATION OF CRANIAL NAVIGATION;  Surgeon: Timothy Ny Ditty, MD;  Location: [facility];  Service: Neurosurgery;  Laterality: N/A;  . CRANIOTOMY N/A 12/26/2014   Procedure: CRANIOTOMY TUMOR EXCISION with BrainLab;  Surgeon: Timothy Ny Ditty, MD;  Location: [facility];  Service: Neurosurgery;  Laterality: N/A;  CRANIOTOMY TUMOR EXCISION with Stealth  . HERNIA REPAIR    . VASECTOMY    . VIDEO ASSISTED THORACOSCOPY (VATS)/WEDGE RESECTION Left 01/14/2015   Procedure: VIDEO ASSISTED THORACOSCOPY (VATS)/WEDGE RESECTION, Superior segmentectomy left  lower lobe, wedge resection of left upper lobe, multiple lymph node disection, On Q insertion.;  Surgeon: Timothy Isaac, MD;  Location: [facility];  Service: Thoracic;  Laterality: Left;  Marland Kitchen VIDEO BRONCHOSCOPY Bilateral 12/16/2014   Procedure: VIDEO BRONCHOSCOPY WITH FLUORO;  Surgeon: Timothy Gobble, MD;  Location: [facility];  Service: Cardiopulmonary;  Laterality: Bilateral;  . VIDEO BRONCHOSCOPY N/A 01/14/2015   Procedure: VIDEO BRONCHOSCOPY;  Surgeon: Timothy Isaac, MD;  Location: [facility];  Service: Thoracic;  Laterality: N/A;    REVIEW OF SYSTEMS:  A comprehensive review of systems was negative.   PHYSICAL EXAMINATION: General appearance: alert, cooperative and no distress Head: Normocephalic, without obvious abnormality, atraumatic Neck: no adenopathy, no JVD, supple, symmetrical, trachea midline and thyroid  not enlarged, symmetric, no tenderness/mass/nodules Lymph nodes: Cervical, supraclavicular, and axillary nodes normal. Resp: clear to auscultation bilaterally Back: symmetric, no curvature. ROM normal. No CVA tenderness. Cardio: regular rate and rhythm, S1, S2 normal, no murmur, click, rub or gallop GI: soft, non-tender; bowel sounds normal; no masses,  no organomegaly Extremities: extremities normal, atraumatic, no cyanosis or edema  ECOG PERFORMANCE STATUS: 0 - Asymptomatic  Blood pressure 123/84, pulse 70, temperature 98.1 F (36.7 C), temperature source Oral, resp. rate 18, height 6' (1.829 m), weight 177 lb (80.3 kg), SpO2 97 %.  LABORATORY DATA: Lab Results  Component Value Date   WBC 7.4 03/03/2016   HGB 15.2 03/03/2016   HCT 44.7 03/03/2016   MCV 93.9 03/03/2016   PLT 256 03/03/2016      Chemistry      Component Value Date/Time   NA 137 02/11/2016 0749   K 4.3 02/11/2016 0749   CL 109 01/16/2015 0450   CO2 19 (L) 02/11/2016 0749   BUN 11.8 02/11/2016 0749   CREATININE 1.0 02/11/2016 0749      Component Value Date/Time   CALCIUM 9.2 02/11/2016 0749   ALKPHOS 128 02/11/2016 0749   AST 22 02/11/2016 0749   ALT 16 02/11/2016 0749   BILITOT 0.49 02/11/2016 0749       RADIOGRAPHIC STUDIES: Ct Chest W Contrast  Result Date: 02/09/2016 CLINICAL DATA:  Lung carcinoma with brain metastasis diagnosed December 2016. Keytruda immunotherapy in progress. Radiation therapy complete. EXAM: CT CHEST, ABDOMEN, AND PELVIS WITH CONTRAST TECHNIQUE: Multidetector CT imaging of the chest, abdomen and pelvis was performed following the standard protocol during bolus administration of intravenous contrast. CONTRAST:  148m ISOVUE-300 IOPAMIDOL (ISOVUE-300) INJECTION 61% COMPARISON:  12/07/2015 FINDINGS: CT CHEST FINDINGS Cardiovascular: No significant vascular findings. Normal heart size. No pericardial effusion. Mediastinum/Nodes: No axillary or supraclavicular adenopathy. No mediastinal  adenopathy. Esophagus normal. Lungs/Pleura: Centrilobular emphysema in the upper lobes. Postsurgical change LEFT upper lobe. Nodular pleural-parenchymal thickening inferior to the surgical site measures 13 mm by 13 mm (image 99, series 6). This compares to 13 mm x 11 mm on comparison exam for no interval change. No new pulmonary nodules. Musculoskeletal: No aggressive osseous lesion. CT ABDOMEN AND PELVIS FINDINGS Hepatobiliary: No focal hepatic lesion. No biliary ductal dilatation. Gallbladder is normal. Common bile duct is normal. Pancreas: Pancreas is normal. No ductal dilatation. No pancreatic inflammation. Spleen: Normal spleen Adrenals/urinary tract: Adrenal glands and kidneys are normal. The ureters and bladder normal. Stomach/Bowel: Stomach, small-bowel, appendix, cecum normal. There are diverticula of the sigmoid colon without acute inflammation. Rectum normal Vascular/Lymphatic: Abdominal aorta is normal caliber. There is no retroperitoneal or periportal lymphadenopathy. No pelvic lymphadenopathy. Reproductive: Prostate normal Other: No peritoneal metastasis Musculoskeletal: No aggressive osseous lesion. IMPRESSION: Chest Impression: 1. Stable nodular thickening inferior to the surgical site in the LEFT lower lobe. 2. No evidence new malignancy in the thorax. Abdomen / Pelvis Impression: 1. No evidence of metastatic disease in the abdomen pelvis. 2.  Atherosclerotic calcification of the aorta. Electronically Signed   By: SSuzy BouchardM.D.   On: 02/09/2016 10:58   Ct Abdomen Pelvis W Contrast  Result Date: 02/09/2016 CLINICAL DATA:  Lung carcinoma with brain metastasis diagnosed December 2016. Keytruda immunotherapy in progress.  Radiation therapy complete. EXAM: CT CHEST, ABDOMEN, AND PELVIS WITH CONTRAST TECHNIQUE: Multidetector CT imaging of the chest, abdomen and pelvis was performed following the standard protocol during bolus administration of intravenous contrast. CONTRAST:  122m ISOVUE-300  IOPAMIDOL (ISOVUE-300) INJECTION 61% COMPARISON:  12/07/2015 FINDINGS: CT CHEST FINDINGS Cardiovascular: No significant vascular findings. Normal heart size. No pericardial effusion. Mediastinum/Nodes: No axillary or supraclavicular adenopathy. No mediastinal adenopathy. Esophagus normal. Lungs/Pleura: Centrilobular emphysema in the upper lobes. Postsurgical change LEFT upper lobe. Nodular pleural-parenchymal thickening inferior to the surgical site measures 13 mm by 13 mm (image 99, series 6). This compares to 13 mm x 11 mm on comparison exam for no interval change. No new pulmonary nodules. Musculoskeletal: No aggressive osseous lesion. CT ABDOMEN AND PELVIS FINDINGS Hepatobiliary: No focal hepatic lesion. No biliary ductal dilatation. Gallbladder is normal. Common bile duct is normal. Pancreas: Pancreas is normal. No ductal dilatation. No pancreatic inflammation. Spleen: Normal spleen Adrenals/urinary tract: Adrenal glands and kidneys are normal. The ureters and bladder normal. Stomach/Bowel: Stomach, small-bowel, appendix, cecum normal. There are diverticula of the sigmoid colon without acute inflammation. Rectum normal Vascular/Lymphatic: Abdominal aorta is normal caliber. There is no retroperitoneal or periportal lymphadenopathy. No pelvic lymphadenopathy. Reproductive: Prostate normal Other: No peritoneal metastasis Musculoskeletal: No aggressive osseous lesion. IMPRESSION: Chest Impression: 1. Stable nodular thickening inferior to the surgical site in the LEFT lower lobe. 2. No evidence new malignancy in the thorax. Abdomen / Pelvis Impression: 1. No evidence of metastatic disease in the abdomen pelvis. 2.  Atherosclerotic calcification of the aorta. Electronically Signed   By: SSuzy BouchardM.D.   On: 02/09/2016 10:58    ASSESSMENT AND PLAN:  This is a very pleasant 56years old white male with metastatic non-small cell lung cancer, adenocarcinoma with positive PDL 1 expression of 100%. He is  currently undergoing treatment with immunotherapy with Ketruda (pembrolizumab) status post 19 cycles. The patient is tolerating the treatment well with no significant adverse effects. I recommended for him to proceed with cycle #20 today as scheduled. He would come back for follow-up visit in 3 weeks for evaluation before starting the next cycle of his treatment. The patient was advised to call immediately if he has any concerning symptoms in the interval. The patient voices understanding of current disease status and treatment options and is in agreement with the current care plan.  All questions were answered. The patient knows to call the clinic with any problems, questions or concerns. We can certainly see the patient much sooner if necessary. I spent 10 minutes counseling the patient face to face. The total time spent in the appointment was 15 minutes.  Disclaimer: This note was dictated with voice recognition software. Similar sounding words can inadvertently be transcribed and may not be corrected upon review.

## 2016-03-03 NOTE — Patient Instructions (Signed)
[facility] Discharge Instructions for Patients Receiving Chemotherapy  Today you received the following chemotherapy agents:  Keytruda.  To help prevent nausea and vomiting after your treatment, we encourage you to take your nausea medication as prescribed.   If you develop nausea and vomiting that is not controlled by your nausea medication, call the clinic.   BELOW ARE SYMPTOMS THAT SHOULD BE REPORTED IMMEDIATELY:  *FEVER GREATER THAN 100.5 F  *CHILLS WITH OR WITHOUT FEVER  NAUSEA AND VOMITING THAT IS NOT CONTROLLED WITH YOUR NAUSEA MEDICATION  *UNUSUAL SHORTNESS OF BREATH  *UNUSUAL BRUISING OR BLEEDING  TENDERNESS IN MOUTH AND THROAT WITH OR WITHOUT PRESENCE OF ULCERS  *URINARY PROBLEMS  *BOWEL PROBLEMS  UNUSUAL RASH Items with * indicate a potential emergency and should be followed up as soon as possible.  Feel free to call the clinic you have any questions or concerns. The clinic phone number is [phone].  Please show the CHEMO ALERT CARD at check-in to the Emergency Department and triage nurse.   

## 2016-03-24 ENCOUNTER — Other Ambulatory Visit (HOSPITAL_BASED_OUTPATIENT_CLINIC_OR_DEPARTMENT_OTHER): Payer: BLUE CROSS/BLUE SHIELD

## 2016-03-24 ENCOUNTER — Encounter: Payer: Self-pay | Admitting: Internal Medicine

## 2016-03-24 ENCOUNTER — Telehealth: Payer: Self-pay | Admitting: Internal Medicine

## 2016-03-24 ENCOUNTER — Ambulatory Visit (HOSPITAL_BASED_OUTPATIENT_CLINIC_OR_DEPARTMENT_OTHER): Payer: BLUE CROSS/BLUE SHIELD

## 2016-03-24 ENCOUNTER — Ambulatory Visit (HOSPITAL_BASED_OUTPATIENT_CLINIC_OR_DEPARTMENT_OTHER): Payer: BLUE CROSS/BLUE SHIELD | Admitting: Internal Medicine

## 2016-03-24 VITALS — BP 120/85 | HR 79 | Temp 97.7°F | Resp 18 | Ht 72.0 in | Wt 180.9 lb

## 2016-03-24 DIAGNOSIS — R21 Rash and other nonspecific skin eruption: Secondary | ICD-10-CM

## 2016-03-24 DIAGNOSIS — C7931 Secondary malignant neoplasm of brain: Secondary | ICD-10-CM | POA: Diagnosis not present

## 2016-03-24 DIAGNOSIS — C3432 Malignant neoplasm of lower lobe, left bronchus or lung: Secondary | ICD-10-CM | POA: Diagnosis not present

## 2016-03-24 DIAGNOSIS — Z5112 Encounter for antineoplastic immunotherapy: Secondary | ICD-10-CM | POA: Diagnosis not present

## 2016-03-24 LAB — COMPREHENSIVE METABOLIC PANEL
ALT: 22 U/L (ref 0–55)
AST: 22 U/L (ref 5–34)
Albumin: 3.9 g/dL (ref 3.5–5.0)
Alkaline Phosphatase: 120 U/L (ref 40–150)
Anion Gap: 10 mEq/L (ref 3–11)
BILIRUBIN TOTAL: 0.29 mg/dL (ref 0.20–1.20)
BUN: 19.1 mg/dL (ref 7.0–26.0)
CALCIUM: 9.1 mg/dL (ref 8.4–10.4)
CO2: 18 mEq/L — ABNORMAL LOW (ref 22–29)
CREATININE: 1.1 mg/dL (ref 0.7–1.3)
Chloride: 108 mEq/L (ref 98–109)
EGFR: 78 mL/min/{1.73_m2} — ABNORMAL LOW (ref >90)
GLUCOSE: 95 mg/dL (ref 70–140)
POTASSIUM: 4.5 meq/L (ref 3.5–5.1)
SODIUM: 136 meq/L (ref 136–145)
TOTAL PROTEIN: 7.2 g/dL (ref 6.4–8.3)

## 2016-03-24 LAB — CBC WITH DIFFERENTIAL/PLATELET
BASO%: 0.9 % (ref 0.0–2.0)
BASOS ABS: 0.1 10*3/uL (ref 0.0–0.1)
EOS%: 4.5 % (ref 0.0–7.0)
Eosinophils Absolute: 0.3 10*3/uL (ref 0.0–0.5)
HEMATOCRIT: 46.5 % (ref 38.4–49.9)
HEMOGLOBIN: 15.7 g/dL (ref 13.0–17.1)
LYMPH#: 2.1 10*3/uL (ref 0.9–3.3)
LYMPH%: 29.9 % (ref 14.0–49.0)
MCH: 31.7 pg (ref 27.2–33.4)
MCHC: 33.9 g/dL (ref 32.0–36.0)
MCV: 93.7 fL (ref 79.3–98.0)
MONO#: 0.7 10*3/uL (ref 0.1–0.9)
MONO%: 9.9 % (ref 0.0–14.0)
NEUT%: 54.8 % (ref 39.0–75.0)
NEUTROS ABS: 3.9 10*3/uL (ref 1.5–6.5)
Platelets: 273 10*3/uL (ref 140–400)
RBC: 4.96 10*6/uL (ref 4.20–5.82)
RDW: 13.4 % (ref 11.0–14.6)
WBC: 7.1 10*3/uL (ref 4.0–10.3)

## 2016-03-24 MED ORDER — SODIUM CHLORIDE 0.9 % IV SOLN
200.0000 mg | Freq: Once | INTRAVENOUS | Status: AC
  Administered 2016-03-24: 200 mg via INTRAVENOUS
  Filled 2016-03-24: qty 8

## 2016-03-24 MED ORDER — SODIUM CHLORIDE 0.9 % IV SOLN
Freq: Once | INTRAVENOUS | Status: AC
  Administered 2016-03-24: 11:00:00 via INTRAVENOUS

## 2016-03-24 NOTE — Patient Instructions (Signed)
[facility] Discharge Instructions for Patients Receiving Chemotherapy  Today you received the following chemotherapy agents:  Keytruda.  To help prevent nausea and vomiting after your treatment, we encourage you to take your nausea medication as prescribed.   If you develop nausea and vomiting that is not controlled by your nausea medication, call the clinic.   BELOW ARE SYMPTOMS THAT SHOULD BE REPORTED IMMEDIATELY:  *FEVER GREATER THAN 100.5 F  *CHILLS WITH OR WITHOUT FEVER  NAUSEA AND VOMITING THAT IS NOT CONTROLLED WITH YOUR NAUSEA MEDICATION  *UNUSUAL SHORTNESS OF BREATH  *UNUSUAL BRUISING OR BLEEDING  TENDERNESS IN MOUTH AND THROAT WITH OR WITHOUT PRESENCE OF ULCERS  *URINARY PROBLEMS  *BOWEL PROBLEMS  UNUSUAL RASH Items with * indicate a potential emergency and should be followed up as soon as possible.  Feel free to call the clinic you have any questions or concerns. The clinic phone number is [phone].  Please show the CHEMO ALERT CARD at check-in to the Emergency Department and triage nurse.   

## 2016-03-24 NOTE — Telephone Encounter (Signed)
Appointments scheduled per 03/24/16 los. Patient was given 2 bottles of contrast with a copy of the instructions, copy of the AVS report and appointment schedule, per 03/24/16 los.

## 2016-03-24 NOTE — Progress Notes (Signed)
[place] [phone]   [phone]  OFFICE PROGRESS NOTE  FRIED, [name], MD [address] [place] [address]  DIAGNOSIS:  Stage IV (T2a, N1, M1b) non-small cell lung cancer, adenocarcinoma, negative EGFR mutation but equivocal EGFR amplification, negative ALK gene translocation and negative ROS 1 but with PDL-1 expression 100% presented with left lower lobe lung mass in addition to left hilar adenopathy and solitary metastatic brain lesion diagnosed in December 2016.  PRIOR THERAPY: 1) status post stereotactic radiotherapy and surgical resection diagnosed in December 2016. 2) status post video bronchoscopy with left VATS and wedge resection of the left upper lobe and left lower lobe superior segmentectomy with lymph node dissection under the care of Dr. Servando Snare on 01/14/2015.  CURRENT THERAPY: First-line treatment with immunotherapy with Ketruda (pembrolizumab) 200 mg IV every 3 weeks status post 20 cycles. First cycle was given 02/18/2015.  INTERVAL HISTORY: Timothy Obrien 56 y.o. male came to the clinic today for follow-up visit. The patient is currently undergoing treatment with Ketruda (pembrolizumab) every 3 weeks status post 20 cycles and tolerating his treatment well with no significant adverse effects. He denied having any chest pain, shortness breath, cough or hemoptysis. He has no fever or chills. He denied having any significant weight loss or night sweats. He has mild skin rash on the face. He is here today for evaluation before starting cycle #21.  MEDICAL HISTORY: Past Medical History:  Diagnosis Date  . Bronchitis   . Chronic fatigue 09/17/2015  . Depression 02/11/2016  . Hypertension 12/31/2015  . Lung cancer ([place])    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  . Pneumonia   . Seizures ([place])    last 12/11/14  . Shortness of breath dyspnea    with exertion  . Tobacco abuse     ALLERGIES:  has No Known  Allergies.  MEDICATIONS:  Current Outpatient Prescriptions  Medication Sig Dispense Refill  . ibuprofen (ADVIL,MOTRIN) 800 MG tablet     . mirtazapine (REMERON) 30 MG tablet TAKE 1 TABLET BY MOUTH AT BEDTIME 90 tablet 0  . sodium chloride 0.9 % SOLN 50 mL with pembrolizumab 100 MG/4ML SOLN 200 mg Inject 200 mg into the vein every 21 ( twenty-one) days. 12 Dose 0   No current facility-administered medications for this visit.     SURGICAL HISTORY:  Past Surgical History:  Procedure Laterality Date  . APPLICATION OF CRANIAL NAVIGATION N/A 12/26/2014   Procedure: APPLICATION OF CRANIAL NAVIGATION;  Surgeon: Kevan Ny Ditty, MD;  Location: [place] [facility];  Service: Neurosurgery;  Laterality: N/A;  . CRANIOTOMY N/A 12/26/2014   Procedure: CRANIOTOMY TUMOR EXCISION with BrainLab;  Surgeon: Kevan Ny Ditty, MD;  Location: [place] [facility];  Service: Neurosurgery;  Laterality: N/A;  CRANIOTOMY TUMOR EXCISION with Stealth  . HERNIA REPAIR    . VASECTOMY    . VIDEO ASSISTED THORACOSCOPY (VATS)/WEDGE RESECTION Left 01/14/2015   Procedure: VIDEO ASSISTED THORACOSCOPY (VATS)/WEDGE RESECTION, Superior segmentectomy left  lower lobe, wedge resection of left upper lobe, multiple lymph node disection, On Q insertion.;  Surgeon: Grace Isaac, MD;  Location: [facility];  Service: Thoracic;  Laterality: Left;  Marland Kitchen VIDEO BRONCHOSCOPY Bilateral 12/16/2014   Procedure: VIDEO BRONCHOSCOPY WITH FLUORO;  Surgeon: Collene Gobble, MD;  Location: [facility];  Service: Cardiopulmonary;  Laterality: Bilateral;  . VIDEO BRONCHOSCOPY N/A 01/14/2015   Procedure: VIDEO BRONCHOSCOPY;  Surgeon: Grace Isaac, MD;  Location:  [facility];  Service: Thoracic;  Laterality: N/A;    REVIEW OF SYSTEMS:  A comprehensive review of systems was negative.   PHYSICAL EXAMINATION: General appearance: alert, cooperative and no distress Head: Normocephalic, without obvious abnormality, atraumatic Neck: no adenopathy, no JVD, supple,  symmetrical, trachea midline and thyroid not enlarged, symmetric, no tenderness/mass/nodules Lymph nodes: Cervical, supraclavicular, and axillary nodes normal. Resp: clear to auscultation bilaterally Back: symmetric, no curvature. ROM normal. No CVA tenderness. Cardio: regular rate and rhythm, S1, S2 normal, no murmur, click, rub or gallop GI: soft, non-tender; bowel sounds normal; no masses,  no organomegaly Extremities: extremities normal, atraumatic, no cyanosis or edema  ECOG PERFORMANCE STATUS: 0 - Asymptomatic  Blood pressure 120/85, pulse 79, temperature 97.7 F (36.5 C), temperature source Oral, resp. rate 18, height 6' (1.829 m), weight 180 lb 14.4 oz (82.1 kg), SpO2 98 %.  LABORATORY DATA: Lab Results  Component Value Date   WBC 7.1 03/24/2016   HGB 15.7 03/24/2016   HCT 46.5 03/24/2016   MCV 93.7 03/24/2016   PLT 273 03/24/2016      Chemistry      Component Value Date/Time   NA 136 03/24/2016 0942   K 4.5 03/24/2016 0942   CL 109 01/16/2015 0450   CO2 18 (L) 03/24/2016 0942   BUN 19.1 03/24/2016 0942   CREATININE 1.1 03/24/2016 0942      Component Value Date/Time   CALCIUM 9.1 03/24/2016 0942   ALKPHOS 120 03/24/2016 0942   AST 22 03/24/2016 0942   ALT 22 03/24/2016 0942   BILITOT 0.29 03/24/2016 0942       RADIOGRAPHIC STUDIES: No results found.  ASSESSMENT AND PLAN:  This is a very pleasant 56 years old white male with metastatic non-small cell lung cancer, adenocarcinoma with positive PDL 1 expression 100%. He is currently undergoing treatment with immunotherapy with Ketruda (pembrolizumab) status post 20 cycles. He is tolerating the treatment well. I recommended for him to proceed with cycle #21 today as scheduled. I will see him back for follow-up visit in 3 weeks for evaluation after repeating CT scan of the chest, abdomen and pelvis for restaging of his disease. For the mild skin rash, we will monitor for now. He was advised to call immediately if  he has any concerning symptoms in the interval. The patient voices understanding of current disease status and treatment options and is in agreement with the current care plan.  All questions were answered. The patient knows to call the clinic with any problems, questions or concerns. We can certainly see the patient much sooner if necessary. I spent 10 minutes counseling the patient face to face. The total time spent in the appointment was 15 minutes.   Disclaimer: This note was dictated with voice recognition software. Similar sounding words can inadvertently be transcribed and may not be corrected upon review.

## 2016-04-11 ENCOUNTER — Ambulatory Visit (HOSPITAL_COMMUNITY)
Admission: RE | Admit: 2016-04-11 | Discharge: 2016-04-11 | Disposition: A | Discharge status: Home / Self Care | Payer: BLUE CROSS/BLUE SHIELD | Source: Ambulatory Visit | Attending: Internal Medicine | Admitting: Internal Medicine

## 2016-04-11 ENCOUNTER — Encounter (HOSPITAL_COMMUNITY): Payer: Self-pay

## 2016-04-11 DIAGNOSIS — C7931 Secondary malignant neoplasm of brain: Secondary | ICD-10-CM

## 2016-04-11 DIAGNOSIS — Z5112 Encounter for antineoplastic immunotherapy: Secondary | ICD-10-CM | POA: Diagnosis not present

## 2016-04-11 DIAGNOSIS — J984 Other disorders of lung: Secondary | ICD-10-CM | POA: Diagnosis not present

## 2016-04-11 DIAGNOSIS — C3432 Malignant neoplasm of lower lobe, left bronchus or lung: Secondary | ICD-10-CM

## 2016-04-11 MED ORDER — IOPAMIDOL (ISOVUE-300) INJECTION 61%
100.0000 mL | Freq: Once | INTRAVENOUS | Status: AC | PRN
  Administered 2016-04-11: 100 mL via INTRAVENOUS

## 2016-04-11 MED ORDER — IOPAMIDOL (ISOVUE-300) INJECTION 61%
INTRAVENOUS | Status: AC
Start: 2016-04-11 — End: 2016-04-11
  Filled 2016-04-11: qty 100

## 2016-04-12 ENCOUNTER — Other Ambulatory Visit: Payer: Self-pay

## 2016-04-12 DIAGNOSIS — C7931 Secondary malignant neoplasm of brain: Secondary | ICD-10-CM

## 2016-04-12 DIAGNOSIS — C3432 Malignant neoplasm of lower lobe, left bronchus or lung: Secondary | ICD-10-CM

## 2016-04-13 ENCOUNTER — Other Ambulatory Visit: Payer: Self-pay | Admitting: Radiation Therapy

## 2016-04-13 DIAGNOSIS — C7931 Secondary malignant neoplasm of brain: Secondary | ICD-10-CM

## 2016-04-14 ENCOUNTER — Telehealth: Payer: Self-pay | Admitting: Internal Medicine

## 2016-04-14 ENCOUNTER — Encounter: Payer: Self-pay | Admitting: Internal Medicine

## 2016-04-14 ENCOUNTER — Other Ambulatory Visit (HOSPITAL_BASED_OUTPATIENT_CLINIC_OR_DEPARTMENT_OTHER): Payer: BLUE CROSS/BLUE SHIELD

## 2016-04-14 ENCOUNTER — Ambulatory Visit (HOSPITAL_BASED_OUTPATIENT_CLINIC_OR_DEPARTMENT_OTHER): Payer: BLUE CROSS/BLUE SHIELD | Admitting: Internal Medicine

## 2016-04-14 ENCOUNTER — Ambulatory Visit (HOSPITAL_BASED_OUTPATIENT_CLINIC_OR_DEPARTMENT_OTHER): Payer: BLUE CROSS/BLUE SHIELD

## 2016-04-14 VITALS — BP 134/81 | HR 68 | Temp 97.8°F | Resp 18 | Ht 72.0 in | Wt 186.1 lb

## 2016-04-14 DIAGNOSIS — C7931 Secondary malignant neoplasm of brain: Secondary | ICD-10-CM

## 2016-04-14 DIAGNOSIS — C3432 Malignant neoplasm of lower lobe, left bronchus or lung: Secondary | ICD-10-CM

## 2016-04-14 DIAGNOSIS — F329 Major depressive disorder, single episode, unspecified: Secondary | ICD-10-CM | POA: Diagnosis not present

## 2016-04-14 DIAGNOSIS — Z5112 Encounter for antineoplastic immunotherapy: Secondary | ICD-10-CM

## 2016-04-14 LAB — COMPREHENSIVE METABOLIC PANEL
ALT: 25 U/L (ref 0–55)
AST: 23 U/L (ref 5–34)
Albumin: 3.6 g/dL (ref 3.5–5.0)
Alkaline Phosphatase: 111 U/L (ref 40–150)
Anion Gap: 9 mEq/L (ref 3–11)
BILIRUBIN TOTAL: 0.29 mg/dL (ref 0.20–1.20)
BUN: 17.7 mg/dL (ref 7.0–26.0)
CO2: 21 meq/L — AB (ref 22–29)
Calcium: 8.7 mg/dL (ref 8.4–10.4)
Chloride: 111 mEq/L — ABNORMAL HIGH (ref 98–109)
Creatinine: 1 mg/dL (ref 0.7–1.3)
EGFR: 89 mL/min/{1.73_m2} — AB (ref >90)
GLUCOSE: 92 mg/dL (ref 70–140)
POTASSIUM: 4.1 meq/L (ref 3.5–5.1)
SODIUM: 140 meq/L (ref 136–145)
TOTAL PROTEIN: 6.6 g/dL (ref 6.4–8.3)

## 2016-04-14 LAB — CBC WITH DIFFERENTIAL/PLATELET
BASO%: 0.8 % (ref 0.0–2.0)
Basophils Absolute: 0.1 10*3/uL (ref 0.0–0.1)
EOS ABS: 0.4 10*3/uL (ref 0.0–0.5)
EOS%: 5.8 % (ref 0.0–7.0)
HCT: 43.3 % (ref 38.4–49.9)
HGB: 14.5 g/dL (ref 13.0–17.1)
LYMPH%: 31.1 % (ref 14.0–49.0)
MCH: 31.6 pg (ref 27.2–33.4)
MCHC: 33.5 g/dL (ref 32.0–36.0)
MCV: 94.3 fL (ref 79.3–98.0)
MONO#: 0.7 10*3/uL (ref 0.1–0.9)
MONO%: 10.1 % (ref 0.0–14.0)
NEUT%: 52.2 % (ref 39.0–75.0)
NEUTROS ABS: 3.7 10*3/uL (ref 1.5–6.5)
Platelets: 251 10*3/uL (ref 140–400)
RBC: 4.59 10*6/uL (ref 4.20–5.82)
RDW: 13.8 % (ref 11.0–14.6)
WBC: 7.1 10*3/uL (ref 4.0–10.3)
lymph#: 2.2 10*3/uL (ref 0.9–3.3)

## 2016-04-14 LAB — TSH: TSH: 0.556 m[IU]/L (ref 0.320–4.118)

## 2016-04-14 MED ORDER — SODIUM CHLORIDE 0.9 % IV SOLN
200.0000 mg | Freq: Once | INTRAVENOUS | Status: AC
  Administered 2016-04-14: 200 mg via INTRAVENOUS
  Filled 2016-04-14: qty 8

## 2016-04-14 MED ORDER — SODIUM CHLORIDE 0.9 % IV SOLN
Freq: Once | INTRAVENOUS | Status: AC
  Administered 2016-04-14: 09:00:00 via INTRAVENOUS

## 2016-04-14 NOTE — Patient Instructions (Signed)
[facility] Discharge Instructions for Patients Receiving Chemotherapy  Today you received the following chemotherapy agents:  Keytruda.  To help prevent nausea and vomiting after your treatment, we encourage you to take your nausea medication as prescribed.   If you develop nausea and vomiting that is not controlled by your nausea medication, call the clinic.   BELOW ARE SYMPTOMS THAT SHOULD BE REPORTED IMMEDIATELY:  *FEVER GREATER THAN 100.5 F  *CHILLS WITH OR WITHOUT FEVER  NAUSEA AND VOMITING THAT IS NOT CONTROLLED WITH YOUR NAUSEA MEDICATION  *UNUSUAL SHORTNESS OF BREATH  *UNUSUAL BRUISING OR BLEEDING  TENDERNESS IN MOUTH AND THROAT WITH OR WITHOUT PRESENCE OF ULCERS  *URINARY PROBLEMS  *BOWEL PROBLEMS  UNUSUAL RASH Items with * indicate a potential emergency and should be followed up as soon as possible.  Feel free to call the clinic you have any questions or concerns. The clinic phone number is [phone].  Please show the CHEMO ALERT CARD at check-in to the Emergency Department and triage nurse.   

## 2016-04-14 NOTE — Telephone Encounter (Signed)
Per 04/14/2016 los. Patient already had appts added - no additional appts needed.

## 2016-04-14 NOTE — Progress Notes (Signed)
[facility] Telephone:([phone]   [phone]  OFFICE PROGRESS NOTE  FRIED, [name], MD [address] [place] [address]  DIAGNOSIS:  Stage IV (T2a, N1, M1b) non-small cell lung cancer, adenocarcinoma, negative EGFR mutation but equivocal EGFR amplification, negative ALK gene translocation and negative ROS 1 but with PDL-1 expression 100% presented with left lower lobe lung mass in addition to left hilar adenopathy and solitary metastatic brain lesion diagnosed in December 2016.  PRIOR THERAPY: 1) status post stereotactic radiotherapy and surgical resection diagnosed in December 2016. 2) status post video bronchoscopy with left VATS and wedge resection of the left upper lobe and left lower lobe superior segmentectomy with lymph node dissection under the care of Dr. Servando Snare on 01/14/2015.  CURRENT THERAPY: First-line treatment with immunotherapy with Ketruda (pembrolizumab) 200 mg IV every 3 weeks status post 21 cycles. First cycle was given 02/18/2015.  INTERVAL HISTORY: Timothy Obrien 56 y.o. male returns to the clinic today for follow-up visit. The patient has been doing fine with no specific complaints. He is currently on treatment with Ketruda (pembrolizumab) every 3 weeks status post 21 cycles and tolerating the treatment well. He denied having any skin rash or diarrhea. He has no weight loss or night sweats. He has no nausea, vomiting, diarrhea or constipation. The patient has no fever or chills. He denied having any current chest pain, shortness of breath, cough or hemoptysis. He had repeat CT scan of the chest, abdomen and pelvis performed recently and he is here for evaluation and discussion of his scan results.  MEDICAL HISTORY: Past Medical History:  Diagnosis Date  . Bronchitis   . Chronic fatigue 09/17/2015  . Depression 02/11/2016  . Hypertension 12/31/2015  . Lung cancer ([place])    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  . Pneumonia    . Seizures ([place])    last 12/11/14  . Shortness of breath dyspnea    with exertion  . Tobacco abuse     ALLERGIES:  has No Known Allergies.  MEDICATIONS:  Current Outpatient Prescriptions  Medication Sig Dispense Refill  . mirtazapine (REMERON) 30 MG tablet TAKE 1 TABLET BY MOUTH AT BEDTIME 90 tablet 0  . sodium chloride 0.9 % SOLN 50 mL with pembrolizumab 100 MG/4ML SOLN 200 mg Inject 200 mg into the vein every 21 ( twenty-one) days. 12 Dose 0   No current facility-administered medications for this visit.     SURGICAL HISTORY:  Past Surgical History:  Procedure Laterality Date  . APPLICATION OF CRANIAL NAVIGATION N/A 12/26/2014   Procedure: APPLICATION OF CRANIAL NAVIGATION;  Surgeon: Kevan Ny Ditty, MD;  Location: [facility];  Service: Neurosurgery;  Laterality: N/A;  . CRANIOTOMY N/A 12/26/2014   Procedure: CRANIOTOMY TUMOR EXCISION with BrainLab;  Surgeon: Kevan Ny Ditty, MD;  Location: [facility];  Service: Neurosurgery;  Laterality: N/A;  CRANIOTOMY TUMOR EXCISION with Stealth  . HERNIA REPAIR    . VASECTOMY    . VIDEO ASSISTED THORACOSCOPY (VATS)/WEDGE RESECTION Left 01/14/2015   Procedure: VIDEO ASSISTED THORACOSCOPY (VATS)/WEDGE RESECTION, Superior segmentectomy left  lower lobe, wedge resection of left upper lobe, multiple lymph node disection, On Q insertion.;  Surgeon: Grace Isaac, MD;  Location: [facility];  Service: Thoracic;  Laterality: Left;  Marland Kitchen VIDEO BRONCHOSCOPY Bilateral 12/16/2014   Procedure: VIDEO BRONCHOSCOPY WITH FLUORO;  Surgeon: Collene Gobble, MD;  Location: [facility];  Service: Cardiopulmonary;  Laterality: Bilateral;  .  VIDEO BRONCHOSCOPY N/A 01/14/2015   Procedure: VIDEO BRONCHOSCOPY;  Surgeon: Grace Isaac, MD;  Location: [facility];  Service: Thoracic;  Laterality: N/A;    REVIEW OF SYSTEMS:  Constitutional: negative Eyes: negative Ears, nose, mouth, throat, and face: negative Respiratory: negative Cardiovascular:  negative Gastrointestinal: negative Genitourinary:negative Integument/breast: negative Hematologic/lymphatic: negative Musculoskeletal:negative Neurological: negative Behavioral/Psych: negative Endocrine: negative Allergic/Immunologic: negative   PHYSICAL EXAMINATION: General appearance: alert, cooperative and no distress Head: Normocephalic, without obvious abnormality, atraumatic Neck: no adenopathy, no JVD, supple, symmetrical, trachea midline and thyroid not enlarged, symmetric, no tenderness/mass/nodules Lymph nodes: Cervical, supraclavicular, and axillary nodes normal. Resp: clear to auscultation bilaterally Back: symmetric, no curvature. ROM normal. No CVA tenderness. Cardio: regular rate and rhythm, S1, S2 normal, no murmur, click, rub or gallop GI: soft, non-tender; bowel sounds normal; no masses,  no organomegaly Extremities: extremities normal, atraumatic, no cyanosis or edema Neurologic: Alert and oriented X 3, normal strength and tone. Normal symmetric reflexes. Normal coordination and gait  ECOG PERFORMANCE STATUS: 0 - Asymptomatic  Blood pressure 134/81, pulse 68, temperature 97.8 F (36.6 C), temperature source Oral, resp. rate 18, height 6' (1.829 m), weight 186 lb 1.6 oz (84.4 kg), SpO2 98 %.  LABORATORY DATA: Lab Results  Component Value Date   WBC 7.1 04/14/2016   HGB 14.5 04/14/2016   HCT 43.3 04/14/2016   MCV 94.3 04/14/2016   PLT 251 04/14/2016      Chemistry      Component Value Date/Time   NA 136 03/24/2016 0942   K 4.5 03/24/2016 0942   CL 109 01/16/2015 0450   CO2 18 (L) 03/24/2016 0942   BUN 19.1 03/24/2016 0942   CREATININE 1.1 03/24/2016 0942      Component Value Date/Time   CALCIUM 9.1 03/24/2016 0942   ALKPHOS 120 03/24/2016 0942   AST 22 03/24/2016 0942   ALT 22 03/24/2016 0942   BILITOT 0.29 03/24/2016 0942       RADIOGRAPHIC STUDIES: Ct Chest W Contrast  Result Date: 04/11/2016 CLINICAL DATA:  Lung cancer diagnosed in  December 2016 status post surgery and intracranial radiation therapy. Keytruda in progress EXAM: CT CHEST, ABDOMEN, AND PELVIS WITH CONTRAST TECHNIQUE: Multidetector CT imaging of the chest, abdomen and pelvis was performed following the standard protocol during bolus administration of intravenous contrast. CONTRAST:  178m ISOVUE-300 IOPAMIDOL (ISOVUE-300) INJECTION 61% COMPARISON:  CT 02/09/2016 and 12/07/2015. FINDINGS: CT CHEST FINDINGS Cardiovascular: Coronary artery atherosclerosis. No acute vascular findings are demonstrated. The heart size is normal. There is small amount of pericardial fluid versus thickening anteriorly which appears unchanged. Mediastinum/Nodes: There are no enlarged mediastinal, hilar or axillary lymph nodes. The thyroid gland, trachea and esophagus demonstrate no significant findings. Lungs/Pleura: There is no pleural effusion. The treated lesion in the superior segment of the left lower lobe is not significantly changed, measuring 16 x 13 mm on image 91. There is adjacent parenchymal scarring which is best appreciated on the reformatted images. There are stable postsurgical changes posterior to the left hilum, along the major fissure. There is stable mild left lower lobe nodularity along the costophrenic angle on image 131. No new or enlarging pulmonary nodules are seen. There is stable emphysema and central airway thickening. Musculoskeletal/Chest wall: No chest wall mass or suspicious osseous findings. CT ABDOMEN AND PELVIS FINDINGS Hepatobiliary: Stable probable cyst inferiorly in the right hepatic lobe on image 65. No new or enlarging hepatic lesions. No evidence of gallstones, gallbladder wall thickening or biliary dilatation. Pancreas: Unremarkable. No pancreatic ductal  dilatation or surrounding inflammatory changes. Spleen: Normal in size without focal abnormality. Adrenals/Urinary Tract: Both adrenal glands appear normal. Stable small low-density lesions in the upper and lower  poles the right kidney, likely cysts. No evidence of enhancing renal mass. No urinary tract calculus or hydronephrosis. The bladder appears unremarkable for its degree of distention. Stomach/Bowel: No evidence of bowel wall thickening, distention or surrounding inflammatory change. Stable mild distal colonic diverticulosis. The appendix appears normal. Vascular/Lymphatic: There are no enlarged abdominal or pelvic lymph nodes. Mild aortic and branch vessel atherosclerosis. Reproductive: The prostate gland and seminal vesicles appear unremarkable. Other: Stable small umbilical hernia containing only fat. No ascites or peritoneal nodularity. Musculoskeletal: No acute or significant osseous findings. Degenerative disc disease at L5-S1. IMPRESSION: 1. No evidence of metastatic disease within the chest, abdomen or pelvis. 2. Stable postsurgical changes and scarring in the left lung. 3. Stable incidental findings, including probable hepatic and renal cysts and mild atherosclerosis. Electronically Signed   By: Richardean Sale M.D.   On: 04/11/2016 09:12   Ct Abdomen Pelvis W Contrast  Result Date: 04/11/2016 CLINICAL DATA:  Lung cancer diagnosed in December 2016 status post surgery and intracranial radiation therapy. Keytruda in progress EXAM: CT CHEST, ABDOMEN, AND PELVIS WITH CONTRAST TECHNIQUE: Multidetector CT imaging of the chest, abdomen and pelvis was performed following the standard protocol during bolus administration of intravenous contrast. CONTRAST:  190m ISOVUE-300 IOPAMIDOL (ISOVUE-300) INJECTION 61% COMPARISON:  CT 02/09/2016 and 12/07/2015. FINDINGS: CT CHEST FINDINGS Cardiovascular: Coronary artery atherosclerosis. No acute vascular findings are demonstrated. The heart size is normal. There is small amount of pericardial fluid versus thickening anteriorly which appears unchanged. Mediastinum/Nodes: There are no enlarged mediastinal, hilar or axillary lymph nodes. The thyroid gland, trachea and esophagus  demonstrate no significant findings. Lungs/Pleura: There is no pleural effusion. The treated lesion in the superior segment of the left lower lobe is not significantly changed, measuring 16 x 13 mm on image 91. There is adjacent parenchymal scarring which is best appreciated on the reformatted images. There are stable postsurgical changes posterior to the left hilum, along the major fissure. There is stable mild left lower lobe nodularity along the costophrenic angle on image 131. No new or enlarging pulmonary nodules are seen. There is stable emphysema and central airway thickening. Musculoskeletal/Chest wall: No chest wall mass or suspicious osseous findings. CT ABDOMEN AND PELVIS FINDINGS Hepatobiliary: Stable probable cyst inferiorly in the right hepatic lobe on image 65. No new or enlarging hepatic lesions. No evidence of gallstones, gallbladder wall thickening or biliary dilatation. Pancreas: Unremarkable. No pancreatic ductal dilatation or surrounding inflammatory changes. Spleen: Normal in size without focal abnormality. Adrenals/Urinary Tract: Both adrenal glands appear normal. Stable small low-density lesions in the upper and lower poles the right kidney, likely cysts. No evidence of enhancing renal mass. No urinary tract calculus or hydronephrosis. The bladder appears unremarkable for its degree of distention. Stomach/Bowel: No evidence of bowel wall thickening, distention or surrounding inflammatory change. Stable mild distal colonic diverticulosis. The appendix appears normal. Vascular/Lymphatic: There are no enlarged abdominal or pelvic lymph nodes. Mild aortic and branch vessel atherosclerosis. Reproductive: The prostate gland and seminal vesicles appear unremarkable. Other: Stable small umbilical hernia containing only fat. No ascites or peritoneal nodularity. Musculoskeletal: No acute or significant osseous findings. Degenerative disc disease at L5-S1. IMPRESSION: 1. No evidence of metastatic  disease within the chest, abdomen or pelvis. 2. Stable postsurgical changes and scarring in the left lung. 3. Stable incidental findings, including probable hepatic and  renal cysts and mild atherosclerosis. Electronically Signed   By: Richardean Sale M.D.   On: 04/11/2016 09:12    ASSESSMENT AND PLAN:  This is a very pleasant 56 years old white male with metastatic non-small cell lung cancer, adenocarcinoma with positive PDL 1 expression 100%. The patient has been doing fine on his current treatment with Ketruda (pembrolizumab) status post 21 cycles. He has no significant adverse effects from this treatment. He had repeat CT scan of the chest, abdomen and pelvis performed recently. I personally and independently reviewed the scan images and discuss the results with the patient today. His CT scan showed no evidence for disease progression. I recommended for the patient to continue his current treatment with Ketruda (pembrolizumab) every 3 weeks. He will proceed with cycle #22 today. The patient is scheduled to have repeat MRI of the brain next week. I will see him back for follow-up visit in 3 weeks for evaluation before the next cycle of his treatment with Ketruda (pembrolizumab). For depression, he will continue his treatment with Remeron. The patient was advised to call immediately if he has any concerning symptoms in the interval. The patient voices understanding of current disease status and treatment options and is in agreement with the current care plan.  All questions were answered. The patient knows to call the clinic with any problems, questions or concerns. We can certainly see the patient much sooner if necessary.  Disclaimer: This note was dictated with voice recognition software. Similar sounding words can inadvertently be transcribed and may not be corrected upon review.

## 2016-04-20 NOTE — Progress Notes (Addendum)
Timothy Mead. Ahlin 56 y.o. gentleman with a 3.6 cm left frontal brain metastasis from non-small cell lung cancer of the left lower lobe of the lung  Review 04-21-16 MRI brain w wo contrast  Headache:None Pain:No Dizziness:NO Nausea/Vomiting/Ataxia:No Visional changes(Blurred/Diplopia (double vision), blind spots and peripheral vision changes):Wears glasses Ring in ears:Non Fatigue:None Cognitive changes:Denies short term or long term memory issues. Weight: Wt Readings from Last 3 Encounters:  04/25/16 181 lb 9.6 oz (82.4 kg)  04/14/16 186 lb 1.6 oz (84.4 kg)  03/24/16 180 lb 14.4 oz (82.1 kg)   Appetite:Good Imaging:04-21-16 MRI w wo contrast Lab:04-14-16 Cmet, CBCw diff, TSH 04-14-16 saw Dr. Julien Nordmann The patient has been doing fine on his current treatment with Ketruda (pembrolizumab) status post 21 cycles. He has no significant adverse effects from this treatment, I recommended for the patient to continue his current treatment with [name] (pembrolizumab) every 3 weeks I will see him back for follow-up visit in 3 weeks for evaluation before the next cycle of his treatment with Ketruda (pembrolizumab BP 126/87   Pulse 80   Temp 97.6 F (36.4 C) (Oral)   Resp 18   Ht (!) 6" (0.152 m)   Wt 181 lb 9.6 oz (82.4 kg)   SpO2 98%   BMI 3546.63 kg/m

## 2016-04-21 ENCOUNTER — Ambulatory Visit
Admission: RE | Admit: 2016-04-21 | Discharge: 2016-04-21 | Disposition: A | Discharge status: Home / Self Care | Payer: BLUE CROSS/BLUE SHIELD | Source: Ambulatory Visit | Attending: Radiation Oncology | Admitting: Radiation Oncology

## 2016-04-21 DIAGNOSIS — C7931 Secondary malignant neoplasm of brain: Secondary | ICD-10-CM

## 2016-04-21 MED ORDER — GADOBENATE DIMEGLUMINE 529 MG/ML IV SOLN
17.0000 mL | Freq: Once | INTRAVENOUS | Status: AC | PRN
  Administered 2016-04-21: 17 mL via INTRAVENOUS

## 2016-04-25 ENCOUNTER — Encounter: Payer: Self-pay | Admitting: Radiation Oncology

## 2016-04-25 ENCOUNTER — Ambulatory Visit
Admission: RE | Admit: 2016-04-25 | Discharge: 2016-04-25 | Disposition: A | Discharge status: Home / Self Care | Payer: BLUE CROSS/BLUE SHIELD | Source: Ambulatory Visit | Attending: Radiation Oncology | Admitting: Radiation Oncology

## 2016-04-25 VITALS — BP 126/87 | HR 80 | Temp 97.6°F | Resp 18 | Ht <= 58 in | Wt 181.6 lb

## 2016-04-25 DIAGNOSIS — Z9889 Other specified postprocedural states: Secondary | ICD-10-CM | POA: Diagnosis not present

## 2016-04-25 DIAGNOSIS — C7931 Secondary malignant neoplasm of brain: Secondary | ICD-10-CM | POA: Diagnosis present

## 2016-04-25 DIAGNOSIS — Z79899 Other long term (current) drug therapy: Secondary | ICD-10-CM | POA: Diagnosis not present

## 2016-04-25 DIAGNOSIS — Z87891 Personal history of nicotine dependence: Secondary | ICD-10-CM | POA: Diagnosis not present

## 2016-04-25 DIAGNOSIS — Z803 Family history of malignant neoplasm of breast: Secondary | ICD-10-CM | POA: Diagnosis not present

## 2016-04-25 DIAGNOSIS — C3432 Malignant neoplasm of lower lobe, left bronchus or lung: Secondary | ICD-10-CM | POA: Diagnosis present

## 2016-04-25 DIAGNOSIS — I10 Essential (primary) hypertension: Secondary | ICD-10-CM | POA: Insufficient documentation

## 2016-04-25 NOTE — Progress Notes (Signed)
Radiation Oncology         [phone] ________________________________  Name: SUNDEEP DESTIN MRN: [id number]  Date: 04/25/2016  DOB: December 24, 1960  Follow-Up Visit Note  CC: Abigail Miyamoto, MD  Ditty, Kevan Ny, *  Diagnosis:  56 y.o.  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 Brain metastasis (HCC) 198.3 C79.31   2. Primary cancer of left lower lobe of lung (HCC) 162.5 C34.32     Interval Since Last Radiation:    12/25/2014 Preop SRS Treatment: Left frontal 36 mm target was treated using 4 Dynamic Conformal Arcs to a prescription dose of 16 Gy.  ExacTrac registration was performed for each couch angle.    Narrative:  Mr. Ciavarella is a pleasant 56 y.o. 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 Julien Nordmann, and was treated with preoperative SRS to a solitary brain metastasis in 2016, followed by surgical resection. He continues in surveillance and his disease has been stable on brain imaging. He returns today in brain clinic and his last MRI on 04/21/16 revealed stability in the size of the previously treated lesion with mild increase in enhancement of the cavity. He is currently receiving [place] as his systemic therapy and has received a total of 22 cycles to date.  On review of systems, the patient reports that he is doing well overall. He denies any chest pain, shortness of breath, cough, fevers, chills, night sweats, unintended weight changes. He denies any bowel or bladder disturbances, and denies abdominal pain, nausea or vomiting. He denies headaches, visual or auditory disturbances, seizures, or tremor. He denies any new musculoskeletal or joint aches or pains, new skin lesions or concerns. A complete review of systems is obtained and is otherwise negative.    .   Past  Medical History:  Past Medical History:  Diagnosis Date  . Bronchitis   . Chronic fatigue 09/17/2015  . Depression 02/11/2016  . Hypertension 12/31/2015  . Lung cancer ([place])    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  . Pneumonia   . Seizures ([place])    last 12/11/14  . Shortness of breath dyspnea    with exertion  . Tobacco abuse     Past Surgical History: Past Surgical History:  Procedure Laterality Date  . APPLICATION OF CRANIAL NAVIGATION N/A 12/26/2014   Procedure: APPLICATION OF CRANIAL NAVIGATION;  Surgeon: Kevan Ny Ditty, MD;  Location: [place];  Service: Neurosurgery;  Laterality: N/A;  . CRANIOTOMY N/A 12/26/2014   Procedure: CRANIOTOMY TUMOR EXCISION with BrainLab;  Surgeon: Kevan Ny Ditty, MD;  Location: [place] [facility];  Service: Neurosurgery;  Laterality: N/A;  CRANIOTOMY TUMOR EXCISION with Stealth  . HERNIA REPAIR    . VASECTOMY    . VIDEO ASSISTED THORACOSCOPY (VATS)/WEDGE RESECTION Left 01/14/2015   Procedure: VIDEO ASSISTED THORACOSCOPY (VATS)/WEDGE RESECTION, Superior segmentectomy left  lower lobe, wedge resection of left upper lobe, multiple lymph node disection, On Q insertion.;  Surgeon: Grace Isaac, MD;  Location: [facility];  Service: Thoracic;  Laterality: Left;  Marland Kitchen VIDEO BRONCHOSCOPY Bilateral 12/16/2014   Procedure: VIDEO BRONCHOSCOPY WITH FLUORO;  Surgeon: Collene Gobble, MD;  Location: [facility];  Service: Cardiopulmonary;  Laterality: Bilateral;  . VIDEO BRONCHOSCOPY N/A 01/14/2015   Procedure:  VIDEO BRONCHOSCOPY;  Surgeon: Grace Isaac, MD;  Location: [facility];  Service: Thoracic;  Laterality: N/A;    Social History:  Social History   Social History  . Marital status: Divorced    Spouse name: N/A  . Number of children: N/A  . Years of education: N/A   Occupational History  . Not on file.   Social History Main Topics  . Smoking status: Former Smoker    Packs/day: 1.00    Years: 36.00    Types: Cigarettes    Start date: 12/14/1979      Quit date: 10/07/2015  . Smokeless tobacco: Current User     Comment: Vapes: Few cigars a day. Quit cigarettes after easter.  . Alcohol use 21.0 oz/week    35 Cans of beer per week     Comment: daily 6 beers a day for the past fefw years.   . Drug use: Yes    Types: Marijuana     Comment: last time 01/04/15  . Sexual activity: Not Currently   Other Topics Concern  . 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 He does have a cat but no other home pets. No mold exposure. Recent travel to [place] but with symptoms at the onset of travel.    Family History: Family History  Problem Relation Age of Onset  . Breast cancer Mother   . Colon polyps Mother   . Arthritis Father      ALLERGIES:  has No Known Allergies.  Meds: Current Outpatient Prescriptions  Medication Sig Dispense Refill  . mirtazapine (REMERON) 30 MG tablet TAKE 1 TABLET BY MOUTH AT BEDTIME 90 tablet 0  . sodium chloride 0.9 % SOLN 50 mL with pembrolizumab 100 MG/4ML SOLN 200 mg Inject 200 mg into the vein every 21 ( twenty-one) days. 12 Dose 0   No current facility-administered medications for this encounter.     Physical Findings: Wt Readings from Last 3 Encounters:  04/25/16 181 lb 9.6 oz (82.4 kg)  04/14/16 186 lb 1.6 oz (84.4 kg)  03/24/16 180 lb 14.4 oz (82.1 kg)   Temp Readings from Last 3 Encounters:  04/25/16 97.6 F (36.4 C) (Oral)  04/14/16 97.8 F (36.6 C) (Oral)  03/24/16 97.7 F (36.5 C) (Oral)   BP Readings from Last 3 Encounters:  04/25/16 126/87  04/14/16 134/81  03/24/16 120/85   Pulse Readings from Last 3 Encounters:  04/25/16 80  04/14/16 68  03/24/16 79  Pain Assessment Pain Score: 0-No pain/10  In general this is a well appearing caucasian male in no acute distress. He's alert and oriented x4 and appropriate throughout the examination. Cardiopulmonary assessment is negative for acute distress and  h exhibits normal effort. He is grossly intact neurologically.   Lab Findings: Lab Results  Component Value Date   WBC 7.1 04/14/2016   WBC 8.8 01/16/2015   HGB 14.5 04/14/2016   HCT 43.3 04/14/2016   PLT 251 04/14/2016    Lab Results  Component Value Date   NA 140 04/14/2016   K 4.1 04/14/2016   CHLORIDE 111 (H) 04/14/2016   CO2 21 (L) 04/14/2016   GLUCOSE 92 04/14/2016   BUN 17.7 04/14/2016   CREATININE 1.0 04/14/2016   BILITOT 0.29 04/14/2016   ALKPHOS 111 04/14/2016   AST 23 04/14/2016   ALT 25 04/14/2016   PROT 6.6 04/14/2016   ALBUMIN 3.6 04/14/2016   CALCIUM 8.7  04/14/2016   ANIONGAP 9 04/14/2016   ANIONGAP 7 01/16/2015    Radiographic Findings: Ct Chest W Contrast  Result Date: 04/11/2016 CLINICAL DATA:  Lung cancer diagnosed in December 2016 status post surgery and intracranial radiation therapy. Keytruda in progress EXAM: CT CHEST, ABDOMEN, AND PELVIS WITH CONTRAST TECHNIQUE: Multidetector CT imaging of the chest, abdomen and pelvis was performed following the standard protocol during bolus administration of intravenous contrast. CONTRAST:  122m ISOVUE-300 IOPAMIDOL (ISOVUE-300) INJECTION 61% COMPARISON:  CT 02/09/2016 and 12/07/2015. FINDINGS: CT CHEST FINDINGS Cardiovascular: Coronary artery atherosclerosis. No acute vascular findings are demonstrated. The heart size is normal. There is small amount of pericardial fluid versus thickening anteriorly which appears unchanged. Mediastinum/Nodes: There are no enlarged mediastinal, hilar or axillary lymph nodes. The thyroid gland, trachea and esophagus demonstrate no significant findings. Lungs/Pleura: There is no pleural effusion. The treated lesion in the superior segment of the left lower lobe is not significantly changed, measuring 16 x 13 mm on image 91. There is adjacent parenchymal scarring which is best appreciated on the reformatted images. There are stable postsurgical changes posterior to the left hilum, along the  major fissure. There is stable mild left lower lobe nodularity along the costophrenic angle on image 131. No new or enlarging pulmonary nodules are seen. There is stable emphysema and central airway thickening. Musculoskeletal/Chest wall: No chest wall mass or suspicious osseous findings. CT ABDOMEN AND PELVIS FINDINGS Hepatobiliary: Stable probable cyst inferiorly in the right hepatic lobe on image 65. No new or enlarging hepatic lesions. No evidence of gallstones, gallbladder wall thickening or biliary dilatation. Pancreas: Unremarkable. No pancreatic ductal dilatation or surrounding inflammatory changes. Spleen: Normal in size without focal abnormality. Adrenals/Urinary Tract: Both adrenal glands appear normal. Stable small low-density lesions in the upper and lower poles the right kidney, likely cysts. No evidence of enhancing renal mass. No urinary tract calculus or hydronephrosis. The bladder appears unremarkable for its degree of distention. Stomach/Bowel: No evidence of bowel wall thickening, distention or surrounding inflammatory change. Stable mild distal colonic diverticulosis. The appendix appears normal. Vascular/Lymphatic: There are no enlarged abdominal or pelvic lymph nodes. Mild aortic and branch vessel atherosclerosis. Reproductive: The prostate gland and seminal vesicles appear unremarkable. Other: Stable small umbilical hernia containing only fat. No ascites or peritoneal nodularity. Musculoskeletal: No acute or significant osseous findings. Degenerative disc disease at L5-S1. IMPRESSION: 1. No evidence of metastatic disease within the chest, abdomen or pelvis. 2. Stable postsurgical changes and scarring in the left lung. 3. Stable incidental findings, including probable hepatic and renal cysts and mild atherosclerosis. Electronically Signed   By: WRichardean SaleM.D.   On: 04/11/2016 09:12   Mr BJeri CosWOJContrast  Result Date: 04/21/2016 CLINICAL DATA:  56year old male status post  preoperative SRS to a solitary brain left frontal brain metastasis due to non-small cell lung cancer. [facility]. Subsequent encounter. EXAM: MRI HEAD WITHOUT AND WITH CONTRAST TECHNIQUE: Multiplanar, multiecho pulse sequences of the brain and surrounding structures were obtained without and with intravenous contrast. CONTRAST:  182mMULTIHANCE GADOBENATE DIMEGLUMINE 529 MG/ML IV SOLN COMPARISON:  11/09/2015 brain MRI and earlier. FINDINGS: Brain: Left superior frontal gyrus resection in treatment cavity is stable in size and configuration on precontrast images with stable mild surrounding T2 and FLAIR hyperintensity and no regional mass effect. Stable mild hemosiderin. Following contrast there is increased but still faint rim enhancement of the cavity, while the anterior cavity region nodular enhancement is slightly increased  in conspicuity (series 10, image 128). Remaining marginal T1 intrinsic hyperintensity and vascular appearing enhancement is stable. No suspicious diffusion signal changes. Stable brain enhancement elsewhere with no other suspicious lesion. No dural thickening. Elsewhere stable gray and white matter signal. No restricted diffusion to suggest acute infarction. No midline shift, mass effect, ventriculomegaly, extra-axial collection or acute intracranial hemorrhage. Cervicomedullary junction and pituitary are within normal limits. Vascular: Major intracranial vascular flow voids are stable. Dural venous sinuses are normally enhancing. Skull and upper cervical spine: Stable changes of left vertex craniotomy. Visualized bone marrow signal is within normal limits. Negative visualized cervical spine and spinal cord. Sinuses/Orbits: Stable and negative; right maxillary sinus mucous retention cyst. Other: Visible internal auditory structures appear normal. Mastoids are clear. Negative scalp soft tissues. IMPRESSION: 1. Faintly increased rim and nodular  enhancement at the left superior frontal gyrus treatment cavity since October 2017, but stable surrounding FLAIR signal and no associated mass effect. This could be evolution of treatment effect and the appearance is not conclusive for disease recurrence. Still, consider a 3 month rather than 6 month followup MRI from today. 2. Elsewhere stable and negative brain. Electronically Signed   By: Genevie Ann M.D.   On: 04/21/2016 11:27   Ct Abdomen Pelvis W Contrast  Result Date: 04/11/2016 CLINICAL DATA:  Lung cancer diagnosed in December 2016 status post surgery and intracranial radiation therapy. Keytruda in progress EXAM: CT CHEST, ABDOMEN, AND PELVIS WITH CONTRAST TECHNIQUE: Multidetector CT imaging of the chest, abdomen and pelvis was performed following the standard protocol during bolus administration of intravenous contrast. CONTRAST:  175m ISOVUE-300 IOPAMIDOL (ISOVUE-300) INJECTION 61% COMPARISON:  CT 02/09/2016 and 12/07/2015. FINDINGS: CT CHEST FINDINGS Cardiovascular: Coronary artery atherosclerosis. No acute vascular findings are demonstrated. The heart size is normal. There is small amount of pericardial fluid versus thickening anteriorly which appears unchanged. Mediastinum/Nodes: There are no enlarged mediastinal, hilar or axillary lymph nodes. The thyroid gland, trachea and esophagus demonstrate no significant findings. Lungs/Pleura: There is no pleural effusion. The treated lesion in the superior segment of the left lower lobe is not significantly changed, measuring 16 x 13 mm on image 91. There is adjacent parenchymal scarring which is best appreciated on the reformatted images. There are stable postsurgical changes posterior to the left hilum, along the major fissure. There is stable mild left lower lobe nodularity along the costophrenic angle on image 131. No new or enlarging pulmonary nodules are seen. There is stable emphysema and central airway thickening. Musculoskeletal/Chest wall: No chest  wall mass or suspicious osseous findings. CT ABDOMEN AND PELVIS FINDINGS Hepatobiliary: Stable probable cyst inferiorly in the right hepatic lobe on image 65. No new or enlarging hepatic lesions. No evidence of gallstones, gallbladder wall thickening or biliary dilatation. Pancreas: Unremarkable. No pancreatic ductal dilatation or surrounding inflammatory changes. Spleen: Normal in size without focal abnormality. Adrenals/Urinary Tract: Both adrenal glands appear normal. Stable small low-density lesions in the upper and lower poles the right kidney, likely cysts. No evidence of enhancing renal mass. No urinary tract calculus or hydronephrosis. The bladder appears unremarkable for its degree of distention. Stomach/Bowel: No evidence of bowel wall thickening, distention or surrounding inflammatory change. Stable mild distal colonic diverticulosis. The appendix appears normal. Vascular/Lymphatic: There are no enlarged abdominal or pelvic lymph nodes. Mild aortic and branch vessel atherosclerosis. Reproductive: The prostate gland and seminal vesicles appear unremarkable. Other: Stable small umbilical hernia containing only fat. No ascites or peritoneal nodularity. Musculoskeletal: No acute or significant osseous findings. Degenerative disc disease  at L5-S1. IMPRESSION: 1. No evidence of metastatic disease within the chest, abdomen or pelvis. 2. Stable postsurgical changes and scarring in the left lung. 3. Stable incidental findings, including probable hepatic and renal cysts and mild atherosclerosis. Electronically Signed   By: Richardean Sale M.D.   On: 04/11/2016 09:12    Impression/Plan:  1. 56 y.o. gentleman withStage IV, T2a, N1, M1b NSCLC, adenocarcinoma of the left lower lobe with brain metastases.  The patient's imaging overall was suggestive of post radiotherapy effect with the enhancement discrepancy. Dr. Tammi Klippel would recommend repeating his imaging in 3 months' time for further evaluation, but after that  if the patient is still interested in extending his interval, we could consider moving his schedule to q4 months. He is in agreement and will contact us with questions or concerns.     Carola Rhine, PAC

## 2016-04-25 NOTE — Addendum Note (Signed)
Encounter addended by: Malena Edman, RN on: 04/25/2016 10:10 AM<BR>    Actions taken: Charge Capture section accepted

## 2016-05-02 ENCOUNTER — Telehealth: Payer: Self-pay | Admitting: Internal Medicine

## 2016-05-02 ENCOUNTER — Telehealth: Payer: Self-pay | Admitting: Medical Oncology

## 2016-05-02 DIAGNOSIS — G4700 Insomnia, unspecified: Secondary | ICD-10-CM

## 2016-05-02 MED ORDER — MIRTAZAPINE 30 MG PO TABS: 30.0000 mg | ORAL_TABLET | Freq: Every day | ORAL | 0 refills | Status: DC

## 2016-05-02 NOTE — Telephone Encounter (Signed)
MM PAL - moved lab/fu from 4/26 to 4/25 with LT. Chemo remains 4/26. Left message for patient re change and confirming lab/LT 4/25 and chemo 4/26.

## 2016-05-02 NOTE — Telephone Encounter (Signed)
Called in remeron.

## 2016-05-03 ENCOUNTER — Telehealth: Payer: Self-pay | Admitting: Internal Medicine

## 2016-05-03 NOTE — Telephone Encounter (Signed)
Patient called to move 4/26 tx to 4/25 with lab/fu that was rescheduled from 4/26 due to MM PAL. Per patient needs all three appointments to be same day.

## 2016-05-04 ENCOUNTER — Ambulatory Visit (HOSPITAL_BASED_OUTPATIENT_CLINIC_OR_DEPARTMENT_OTHER): Payer: BLUE CROSS/BLUE SHIELD

## 2016-05-04 ENCOUNTER — Ambulatory Visit (HOSPITAL_BASED_OUTPATIENT_CLINIC_OR_DEPARTMENT_OTHER): Payer: BLUE CROSS/BLUE SHIELD | Admitting: Nurse Practitioner

## 2016-05-04 ENCOUNTER — Other Ambulatory Visit (HOSPITAL_BASED_OUTPATIENT_CLINIC_OR_DEPARTMENT_OTHER): Payer: BLUE CROSS/BLUE SHIELD

## 2016-05-04 VITALS — BP 142/81 | HR 71 | Temp 98.3°F | Resp 18 | Ht 72.0 in | Wt 182.3 lb

## 2016-05-04 DIAGNOSIS — C7931 Secondary malignant neoplasm of brain: Secondary | ICD-10-CM

## 2016-05-04 DIAGNOSIS — F329 Major depressive disorder, single episode, unspecified: Secondary | ICD-10-CM

## 2016-05-04 DIAGNOSIS — Z5112 Encounter for antineoplastic immunotherapy: Secondary | ICD-10-CM

## 2016-05-04 DIAGNOSIS — C3432 Malignant neoplasm of lower lobe, left bronchus or lung: Secondary | ICD-10-CM | POA: Diagnosis not present

## 2016-05-04 LAB — CBC WITH DIFFERENTIAL/PLATELET
BASO%: 1 % (ref 0.0–2.0)
BASOS ABS: 0.1 10*3/uL (ref 0.0–0.1)
EOS%: 2.2 % (ref 0.0–7.0)
Eosinophils Absolute: 0.2 10*3/uL (ref 0.0–0.5)
HCT: 44.7 % (ref 38.4–49.9)
HGB: 15.2 g/dL (ref 13.0–17.1)
LYMPH#: 2 10*3/uL (ref 0.9–3.3)
LYMPH%: 25.1 % (ref 14.0–49.0)
MCH: 32 pg (ref 27.2–33.4)
MCHC: 34 g/dL (ref 32.0–36.0)
MCV: 94.1 fL (ref 79.3–98.0)
MONO#: 1 10*3/uL — ABNORMAL HIGH (ref 0.1–0.9)
MONO%: 12.2 % (ref 0.0–14.0)
NEUT#: 4.8 10*3/uL (ref 1.5–6.5)
NEUT%: 59.5 % (ref 39.0–75.0)
Platelets: 258 10*3/uL (ref 140–400)
RBC: 4.75 10*6/uL (ref 4.20–5.82)
RDW: 13.9 % (ref 11.0–14.6)
WBC: 8.1 10*3/uL (ref 4.0–10.3)

## 2016-05-04 LAB — COMPREHENSIVE METABOLIC PANEL
ALBUMIN: 3.8 g/dL (ref 3.5–5.0)
ALK PHOS: 114 U/L (ref 40–150)
ALT: 19 U/L (ref 0–55)
AST: 22 U/L (ref 5–34)
Anion Gap: 11 mEq/L (ref 3–11)
BUN: 21.9 mg/dL (ref 7.0–26.0)
CALCIUM: 9.1 mg/dL (ref 8.4–10.4)
CO2: 22 mEq/L (ref 22–29)
Chloride: 108 mEq/L (ref 98–109)
Creatinine: 1.3 mg/dL (ref 0.7–1.3)
EGFR: 63 mL/min/{1.73_m2} — AB (ref >90)
Glucose: 99 mg/dl (ref 70–140)
POTASSIUM: 4 meq/L (ref 3.5–5.1)
Sodium: 141 mEq/L (ref 136–145)
Total Bilirubin: 0.34 mg/dL (ref 0.20–1.20)
Total Protein: 7 g/dL (ref 6.4–8.3)

## 2016-05-04 MED ORDER — SODIUM CHLORIDE 0.9 % IV SOLN
Freq: Once | INTRAVENOUS | Status: AC
  Administered 2016-05-04: 15:00:00 via INTRAVENOUS

## 2016-05-04 MED ORDER — SODIUM CHLORIDE 0.9 % IV SOLN
200.0000 mg | Freq: Once | INTRAVENOUS | Status: AC
  Administered 2016-05-04: 200 mg via INTRAVENOUS
  Filled 2016-05-04: qty 8

## 2016-05-04 NOTE — Progress Notes (Signed)
  [facility] OFFICE PROGRESS NOTE   DIAGNOSIS: Stage IV (T2a, N1, M1b) non-small cell lung cancer, adenocarcinoma, negative EGFR mutation but equivocal EGFR amplification, negative ALK gene translocation and negative ROS 1 but with PDL-1 expression 100%presented with left lower lobe lung mass in addition to left hilar adenopathy and solitary metastatic brain lesion diagnosed in December 2016.  PRIOR THERAPY: 1) status post stereotactic radiotherapy and surgical resection diagnosed in December 2016. 2) status post video bronchoscopy with left VATS and wedge resection of the left upper lobe and left lower lobe superior segmentectomy with lymph node dissection under the care of Dr. Servando Snare on 01/14/2015.  CURRENT THERAPY: First-line treatment with immunotherapy with Ketruda (pembrolizumab) 200 mg IV every 3 weeks status post 22 cycles. First cycle was given 02/18/2015.    INTERVAL HISTORY:   Mr. Michaelsen returns as scheduled. He completed cycle 22 Pembrolizumab 04/14/2016. He denies nausea/vomiting. No mouth sores. No diarrhea. No rash. No shortness of breath. No cough. He denies pain. He has a good appetite.  Objective:  Vital signs in last 24 hours:  Blood pressure (!) 142/81, pulse 71, temperature 98.3 F (36.8 C), temperature source Oral, resp. rate 18, height 6' (1.829 m), weight 182 lb 4.8 oz (82.7 kg), SpO2 98 %.    HEENT: No thrush or ulcers. Resp: Lungs clear bilaterally. Cardio: Regular rate and rhythm. GI: Abdomen soft and nontender. No hepatomegaly. Vascular: No leg edema. Neuro: Alert and oriented.  Skin: No rash.    Lab Results:  Lab Results  Component Value Date   WBC 8.1 05/04/2016   HGB 15.2 05/04/2016   HCT 44.7 05/04/2016   MCV 94.1 05/04/2016   PLT 258 05/04/2016   NEUTROABS 4.8 05/04/2016    Imaging:  No results found.  Medications: I have reviewed the patient's current medications.  Assessment/Plan: 1. Stage IV non-small cell  lung cancer presenting with a solitary brain metastasis status post SRS followed by resection; also status post wedge resection left upper lobe as well as left lower lobe superior segmentectomy with lymph node dissection. Left lower lobe resection margin positive. Molecular study with equivocal EGFR amplification, negative ALK gene translocation, negative Ros 1 but the immunohistochemical stains showed positive for PDL 1 expression at 100%. Pembrolizumab initiated 02/18/2015. 22 cycles completed to date. Most recent restaging CT scans 04/11/2016 showed no evidence for disease progression. 2. Depression. He continues Remeron.   Disposition: Mr. Gladd appears well. He has completed 22 cycles of Pembrolizumab. Plan to proceed with cycle 23 today as scheduled. He will return for a follow-up visit and cycle 24 Pembrolizumab 05/26/2016. He will contact the office in the interim with any problems.  Plan reviewed with Dr. Julien Nordmann.    Ned Card ANP/GNP-BC   05/04/2016  3:23 PM

## 2016-05-04 NOTE — Patient Instructions (Signed)
[facility] Discharge Instructions for Patients Receiving Chemotherapy  Today you received the following chemotherapy agents:  Keytruda.  To help prevent nausea and vomiting after your treatment, we encourage you to take your nausea medication as prescribed.   If you develop nausea and vomiting that is not controlled by your nausea medication, call the clinic.   BELOW ARE SYMPTOMS THAT SHOULD BE REPORTED IMMEDIATELY:  *FEVER GREATER THAN 100.5 F  *CHILLS WITH OR WITHOUT FEVER  NAUSEA AND VOMITING THAT IS NOT CONTROLLED WITH YOUR NAUSEA MEDICATION  *UNUSUAL SHORTNESS OF BREATH  *UNUSUAL BRUISING OR BLEEDING  TENDERNESS IN MOUTH AND THROAT WITH OR WITHOUT PRESENCE OF ULCERS  *URINARY PROBLEMS  *BOWEL PROBLEMS  UNUSUAL RASH Items with * indicate a potential emergency and should be followed up as soon as possible.  Feel free to call the clinic you have any questions or concerns. The clinic phone number is [phone].  Please show the CHEMO ALERT CARD at check-in to the Emergency Department and triage nurse.   

## 2016-05-05 ENCOUNTER — Other Ambulatory Visit: Payer: BLUE CROSS/BLUE SHIELD

## 2016-05-05 ENCOUNTER — Ambulatory Visit: Payer: BLUE CROSS/BLUE SHIELD

## 2016-05-05 ENCOUNTER — Ambulatory Visit: Payer: BLUE CROSS/BLUE SHIELD | Admitting: Internal Medicine

## 2016-05-16 ENCOUNTER — Telehealth: Payer: Self-pay | Admitting: Internal Medicine

## 2016-05-16 NOTE — Telephone Encounter (Signed)
Routed note to Audie Clear and Larrie Kass .

## 2016-05-16 NOTE — Telephone Encounter (Signed)
Patient called and was checking on his last chemo appointment he said that someone was suppose to call him back with the date and time and he has not heard anything.

## 2016-05-19 ENCOUNTER — Telehealth: Payer: Self-pay | Admitting: Internal Medicine

## 2016-05-19 NOTE — Telephone Encounter (Signed)
Scheduled appt per 4/25 los - called and confirmed appt with patient - patient is aware of appt date and time.

## 2016-05-26 ENCOUNTER — Ambulatory Visit (HOSPITAL_BASED_OUTPATIENT_CLINIC_OR_DEPARTMENT_OTHER): Payer: BLUE CROSS/BLUE SHIELD | Admitting: Internal Medicine

## 2016-05-26 ENCOUNTER — Other Ambulatory Visit (HOSPITAL_BASED_OUTPATIENT_CLINIC_OR_DEPARTMENT_OTHER): Payer: BLUE CROSS/BLUE SHIELD

## 2016-05-26 ENCOUNTER — Ambulatory Visit (HOSPITAL_BASED_OUTPATIENT_CLINIC_OR_DEPARTMENT_OTHER): Payer: BLUE CROSS/BLUE SHIELD

## 2016-05-26 ENCOUNTER — Telehealth: Payer: Self-pay | Admitting: Internal Medicine

## 2016-05-26 ENCOUNTER — Encounter: Payer: Self-pay | Admitting: Internal Medicine

## 2016-05-26 VITALS — BP 130/90 | HR 71 | Temp 98.8°F | Resp 20 | Ht 72.0 in | Wt 184.0 lb

## 2016-05-26 DIAGNOSIS — F329 Major depressive disorder, single episode, unspecified: Secondary | ICD-10-CM

## 2016-05-26 DIAGNOSIS — C3432 Malignant neoplasm of lower lobe, left bronchus or lung: Secondary | ICD-10-CM | POA: Diagnosis not present

## 2016-05-26 DIAGNOSIS — Z5112 Encounter for antineoplastic immunotherapy: Secondary | ICD-10-CM | POA: Diagnosis not present

## 2016-05-26 DIAGNOSIS — C7931 Secondary malignant neoplasm of brain: Secondary | ICD-10-CM

## 2016-05-26 LAB — CBC WITH DIFFERENTIAL/PLATELET
BASO%: 0.7 % (ref 0.0–2.0)
Basophils Absolute: 0.1 10*3/uL (ref 0.0–0.1)
EOS%: 4.3 % (ref 0.0–7.0)
Eosinophils Absolute: 0.4 10*3/uL (ref 0.0–0.5)
HCT: 44.8 % (ref 38.4–49.9)
HEMOGLOBIN: 15.1 g/dL (ref 13.0–17.1)
LYMPH%: 27.2 % (ref 14.0–49.0)
MCH: 32.5 pg (ref 27.2–33.4)
MCHC: 33.7 g/dL (ref 32.0–36.0)
MCV: 96.6 fL (ref 79.3–98.0)
MONO#: 1 10*3/uL — ABNORMAL HIGH (ref 0.1–0.9)
MONO%: 9.9 % (ref 0.0–14.0)
NEUT%: 57.9 % (ref 39.0–75.0)
NEUTROS ABS: 5.6 10*3/uL (ref 1.5–6.5)
Platelets: 270 10*3/uL (ref 140–400)
RBC: 4.64 10*6/uL (ref 4.20–5.82)
RDW: 14.1 % (ref 11.0–14.6)
WBC: 9.7 10*3/uL (ref 4.0–10.3)
lymph#: 2.7 10*3/uL (ref 0.9–3.3)

## 2016-05-26 LAB — COMPREHENSIVE METABOLIC PANEL
ALBUMIN: 3.9 g/dL (ref 3.5–5.0)
ALK PHOS: 111 U/L (ref 40–150)
ALT: 18 U/L (ref 0–55)
AST: 23 U/L (ref 5–34)
Anion Gap: 9 mEq/L (ref 3–11)
BILIRUBIN TOTAL: 0.41 mg/dL (ref 0.20–1.20)
BUN: 15.3 mg/dL (ref 7.0–26.0)
CO2: 20 mEq/L — ABNORMAL LOW (ref 22–29)
Calcium: 9 mg/dL (ref 8.4–10.4)
Chloride: 110 mEq/L — ABNORMAL HIGH (ref 98–109)
Creatinine: 1 mg/dL (ref 0.7–1.3)
EGFR: 84 mL/min/{1.73_m2} — ABNORMAL LOW (ref >90)
GLUCOSE: 99 mg/dL (ref 70–140)
POTASSIUM: 4.1 meq/L (ref 3.5–5.1)
SODIUM: 140 meq/L (ref 136–145)
TOTAL PROTEIN: 7.1 g/dL (ref 6.4–8.3)

## 2016-05-26 MED ORDER — SODIUM CHLORIDE 0.9 % IV SOLN
Freq: Once | INTRAVENOUS | Status: AC
  Administered 2016-05-26: 10:00:00 via INTRAVENOUS

## 2016-05-26 MED ORDER — SODIUM CHLORIDE 0.9 % IV SOLN
200.0000 mg | Freq: Once | INTRAVENOUS | Status: AC
  Administered 2016-05-26: 200 mg via INTRAVENOUS
  Filled 2016-05-26: qty 8

## 2016-05-26 NOTE — Telephone Encounter (Signed)
Gave patient avs report and appointments for June and July.  °

## 2016-05-26 NOTE — Patient Instructions (Signed)
[facility] Discharge Instructions for Patients Receiving Chemotherapy  Today you received the following chemotherapy agents keytruda   To help prevent nausea and vomiting after your treatment, we encourage you to take your nausea medication as directed  If you develop nausea and vomiting that is not controlled by your nausea medication, call the clinic.   BELOW ARE SYMPTOMS THAT SHOULD BE REPORTED IMMEDIATELY:  *FEVER GREATER THAN 100.5 F  *CHILLS WITH OR WITHOUT FEVER  NAUSEA AND VOMITING THAT IS NOT CONTROLLED WITH YOUR NAUSEA MEDICATION  *UNUSUAL SHORTNESS OF BREATH  *UNUSUAL BRUISING OR BLEEDING  TENDERNESS IN MOUTH AND THROAT WITH OR WITHOUT PRESENCE OF ULCERS  *URINARY PROBLEMS  *BOWEL PROBLEMS  UNUSUAL RASH Items with * indicate a potential emergency and should be followed up as soon as possible.  Feel free to call the clinic you have any questions or concerns. The clinic phone number is [phone].  

## 2016-05-26 NOTE — Progress Notes (Signed)
    [facility] [phone]   [phone]  OFFICE PROGRESS NOTE  Obrien, Robert, MD [address] [place] [address]  DIAGNOSIS:  Stage IV (T2a, N1, M1b) non-small cell lung cancer, adenocarcinoma, negative EGFR mutation but equivocal EGFR amplification, negative ALK gene translocation and negative ROS 1 but with PDL-1 expression 100% presented with left lower lobe lung mass in addition to left hilar adenopathy and solitary metastatic brain lesion diagnosed in December 2016.  PRIOR THERAPY: 1) status post stereotactic radiotherapy and surgical resection diagnosed in December 2016. 2) status post video bronchoscopy with left VATS and wedge resection of the left upper lobe and left lower lobe superior segmentectomy with lymph node dissection under the care of Dr. Gerhardt on 01/14/2015.  CURRENT THERAPY: First-line treatment with immunotherapy with Ketruda (pembrolizumab) 200 mg IV every 3 weeks status post 23 cycles. First cycle was given 02/18/2015.  INTERVAL HISTORY: Timothy Obrien 55 y.o. male returns to the clinic today for follow-up visit. The patient is feeling fine today with no specific complaints. He denied having any chest pain, shortness of breath, cough or hemoptysis. He denied having any fever or chills. He has no nausea, vomiting, diarrhea or constipation. He denied having any skin rash. He is tolerating his current treatment with Ketruda (pembrolizumab) fairly well. He is here today to start cycle #24 of his treatment.   MEDICAL HISTORY: Past Medical History:  Diagnosis Date  . Bronchitis   . Chronic fatigue 09/17/2015  . Depression 02/11/2016  . Hypertension 12/31/2015  . Lung cancer (HCC)    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  . Pneumonia   . Seizures (HCC)    last 12/11/14  . Shortness of breath dyspnea    with exertion  . Tobacco abuse     ALLERGIES:  has No Known Allergies.  MEDICATIONS:  Current Outpatient  Prescriptions  Medication Sig Dispense Refill  . mirtazapine (REMERON) 30 MG tablet Take 1 tablet (30 mg total) by mouth at bedtime. 90 tablet 0  . sodium chloride 0.9 % SOLN 50 mL with pembrolizumab 100 MG/4ML SOLN 200 mg Inject 200 mg into the vein every 21 ( twenty-one) days. (Patient not taking: Reported on 05/04/2016) 12 Dose 0   No current facility-administered medications for this visit.     SURGICAL HISTORY:  Past Surgical History:  Procedure Laterality Date  . APPLICATION OF CRANIAL NAVIGATION N/A 12/26/2014   Procedure: APPLICATION OF CRANIAL NAVIGATION;  Surgeon: Benjamin Jared Ditty, MD;  Location: [facility];  Service: Neurosurgery;  Laterality: N/A;  . CRANIOTOMY N/A 12/26/2014   Procedure: CRANIOTOMY TUMOR EXCISION with BrainLab;  Surgeon: Benjamin Jared Ditty, MD;  Location: [facility];  Service: Neurosurgery;  Laterality: N/A;  CRANIOTOMY TUMOR EXCISION with Stealth  . HERNIA REPAIR    . VASECTOMY    . VIDEO ASSISTED THORACOSCOPY (VATS)/WEDGE RESECTION Left 01/14/2015   Procedure: VIDEO ASSISTED THORACOSCOPY (VATS)/WEDGE RESECTION, Superior segmentectomy left  lower lobe, wedge resection of left upper lobe, multiple lymph node disection, On Q insertion.;  Surgeon: Edward B Gerhardt, MD;  Location: [facility] OR;  Service: Thoracic;  Laterality: Left;  . VIDEO BRONCHOSCOPY Bilateral 12/16/2014   Procedure: VIDEO BRONCHOSCOPY WITH FLUORO;  Surgeon: Timothy S Byrum, MD;  Location: [facility];  Service: Cardiopulmonary;  Laterality: Bilateral;  . VIDEO BRONCHOSCOPY N/A 01/14/2015   Procedure: VIDEO BRONCHOSCOPY;  Surgeon: Edward B Gerhardt, MD;  Location: [facility] OR;  Service: Thoracic;  Laterality:   N/A;    REVIEW OF SYSTEMS:  A comprehensive review of systems was negative.   PHYSICAL EXAMINATION: General appearance: alert, cooperative and no distress Head: Normocephalic, without obvious abnormality, atraumatic Neck: no adenopathy, no JVD, supple, symmetrical, trachea midline and thyroid  not enlarged, symmetric, no tenderness/mass/nodules Lymph nodes: Cervical, supraclavicular, and axillary nodes normal. Resp: clear to auscultation bilaterally Back: symmetric, no curvature. ROM normal. No CVA tenderness. Cardio: regular rate and rhythm, S1, S2 normal, no murmur, click, rub or gallop GI: soft, non-tender; bowel sounds normal; no masses,  no organomegaly Extremities: extremities normal, atraumatic, no cyanosis or edema  ECOG PERFORMANCE STATUS: 0 - Asymptomatic  Blood pressure 130/90, pulse 71, temperature 98.8 F (37.1 C), temperature source Oral, resp. rate 20, height 6' (1.829 m), weight 184 lb (83.5 kg), SpO2 98 %.  LABORATORY DATA: Lab Results  Component Value Date   WBC 9.7 05/26/2016   HGB 15.1 05/26/2016   HCT 44.8 05/26/2016   MCV 96.6 05/26/2016   PLT 270 05/26/2016      Chemistry      Component Value Date/Time   NA 141 05/04/2016 1350   K 4.0 05/04/2016 1350   CL 109 01/16/2015 0450   CO2 22 05/04/2016 1350   BUN 21.9 05/04/2016 1350   CREATININE 1.3 05/04/2016 1350      Component Value Date/Time   CALCIUM 9.1 05/04/2016 1350   ALKPHOS 114 05/04/2016 1350   AST 22 05/04/2016 1350   ALT 19 05/04/2016 1350   BILITOT 0.34 05/04/2016 1350       RADIOGRAPHIC STUDIES: No results found.  ASSESSMENT AND PLAN:  This is a very pleasant 56 years old white male with metastatic non-small cell lung cancer, adenocarcinoma with positive PDL 1 expression 100%. He is currently on treatment with Ketruda 200 mg IV every 3 weeks status post 23 cycles. He is rating the treatment well with no significant adverse effects. I recommended for him to proceed with cycle #24 today as scheduled. For the patient and the patient would like to taper his dose of Remeron and we will start with 15 mg by mouth daily for the next 1-2 months. The patient was advised to call immediately if he has any concerning symptoms in the interval. The patient voices understanding of current  disease status and treatment options and is in agreement with the current care plan. All questions were answered. The patient knows to call the clinic with any problems, questions or concerns. We can certainly see the patient much sooner if necessary. I spent 10 minutes counseling the patient face to face. The total time spent in the appointment was 15 minutes.  Disclaimer: This note was dictated with voice recognition software. Similar sounding words can inadvertently be transcribed and may not be corrected upon review.

## 2016-06-16 ENCOUNTER — Ambulatory Visit (HOSPITAL_BASED_OUTPATIENT_CLINIC_OR_DEPARTMENT_OTHER): Payer: BLUE CROSS/BLUE SHIELD | Admitting: Internal Medicine

## 2016-06-16 ENCOUNTER — Encounter: Payer: Self-pay | Admitting: Internal Medicine

## 2016-06-16 ENCOUNTER — Telehealth: Payer: Self-pay | Admitting: Internal Medicine

## 2016-06-16 ENCOUNTER — Ambulatory Visit (HOSPITAL_BASED_OUTPATIENT_CLINIC_OR_DEPARTMENT_OTHER): Payer: BLUE CROSS/BLUE SHIELD

## 2016-06-16 ENCOUNTER — Other Ambulatory Visit (HOSPITAL_BASED_OUTPATIENT_CLINIC_OR_DEPARTMENT_OTHER): Payer: BLUE CROSS/BLUE SHIELD

## 2016-06-16 VITALS — BP 118/89 | HR 70 | Temp 97.8°F | Resp 18 | Ht 72.0 in | Wt 185.9 lb

## 2016-06-16 DIAGNOSIS — Z5112 Encounter for antineoplastic immunotherapy: Secondary | ICD-10-CM | POA: Diagnosis not present

## 2016-06-16 DIAGNOSIS — C3432 Malignant neoplasm of lower lobe, left bronchus or lung: Secondary | ICD-10-CM

## 2016-06-16 DIAGNOSIS — C7931 Secondary malignant neoplasm of brain: Secondary | ICD-10-CM

## 2016-06-16 LAB — CBC WITH DIFFERENTIAL/PLATELET
BASO%: 1.4 % (ref 0.0–2.0)
BASOS ABS: 0.1 10*3/uL (ref 0.0–0.1)
EOS ABS: 0.3 10*3/uL (ref 0.0–0.5)
EOS%: 4.6 % (ref 0.0–7.0)
HCT: 44.4 % (ref 38.4–49.9)
HGB: 15.2 g/dL (ref 13.0–17.1)
LYMPH%: 25.7 % (ref 14.0–49.0)
MCH: 32.4 pg (ref 27.2–33.4)
MCHC: 34.1 g/dL (ref 32.0–36.0)
MCV: 95 fL (ref 79.3–98.0)
MONO#: 0.7 10*3/uL (ref 0.1–0.9)
MONO%: 9.5 % (ref 0.0–14.0)
NEUT#: 4.3 10*3/uL (ref 1.5–6.5)
NEUT%: 58.8 % (ref 39.0–75.0)
Platelets: 282 10*3/uL (ref 140–400)
RBC: 4.68 10*6/uL (ref 4.20–5.82)
RDW: 13.3 % (ref 11.0–14.6)
WBC: 7.3 10*3/uL (ref 4.0–10.3)
lymph#: 1.9 10*3/uL (ref 0.9–3.3)

## 2016-06-16 LAB — COMPREHENSIVE METABOLIC PANEL
ALBUMIN: 4 g/dL (ref 3.5–5.0)
ALK PHOS: 108 U/L (ref 40–150)
ALT: 23 U/L (ref 0–55)
AST: 27 U/L (ref 5–34)
Anion Gap: 11 mEq/L (ref 3–11)
BUN: 16.9 mg/dL (ref 7.0–26.0)
CALCIUM: 9 mg/dL (ref 8.4–10.4)
CO2: 19 mEq/L — ABNORMAL LOW (ref 22–29)
Chloride: 110 mEq/L — ABNORMAL HIGH (ref 98–109)
Creatinine: 1 mg/dL (ref 0.7–1.3)
EGFR: 81 mL/min/{1.73_m2} — AB (ref >90)
Glucose: 96 mg/dl (ref 70–140)
POTASSIUM: 4.3 meq/L (ref 3.5–5.1)
SODIUM: 140 meq/L (ref 136–145)
Total Bilirubin: 0.42 mg/dL (ref 0.20–1.20)
Total Protein: 7.2 g/dL (ref 6.4–8.3)

## 2016-06-16 LAB — TSH: TSH: 0.884 m[IU]/L (ref 0.320–4.118)

## 2016-06-16 MED ORDER — SODIUM CHLORIDE 0.9 % IV SOLN
Freq: Once | INTRAVENOUS | Status: AC
  Administered 2016-06-16: 10:00:00 via INTRAVENOUS

## 2016-06-16 MED ORDER — SODIUM CHLORIDE 0.9 % IV SOLN
200.0000 mg | Freq: Once | INTRAVENOUS | Status: AC
  Administered 2016-06-16: 200 mg via INTRAVENOUS
  Filled 2016-06-16: qty 8

## 2016-06-16 NOTE — Patient Instructions (Signed)
[facility] Discharge Instructions for Patients Receiving Chemotherapy  Today you received the following chemotherapy agents: Keytruda   To help prevent nausea and vomiting after your treatment, we encourage you to take your nausea medication as directed    If you develop nausea and vomiting that is not controlled by your nausea medication, call the clinic.   BELOW ARE SYMPTOMS THAT SHOULD BE REPORTED IMMEDIATELY:  *FEVER GREATER THAN 100.5 F  *CHILLS WITH OR WITHOUT FEVER  NAUSEA AND VOMITING THAT IS NOT CONTROLLED WITH YOUR NAUSEA MEDICATION  *UNUSUAL SHORTNESS OF BREATH  *UNUSUAL BRUISING OR BLEEDING  TENDERNESS IN MOUTH AND THROAT WITH OR WITHOUT PRESENCE OF ULCERS  *URINARY PROBLEMS  *BOWEL PROBLEMS  UNUSUAL RASH Items with * indicate a potential emergency and should be followed up as soon as possible.  Feel free to call the clinic you have any questions or concerns. The clinic phone number is [phone].  Please show the CHEMO ALERT CARD at check-in to the Emergency Department and triage nurse.   

## 2016-06-16 NOTE — Telephone Encounter (Signed)
Gave patient avs report and appointments for June thru August. °

## 2016-06-16 NOTE — Progress Notes (Signed)
[facility] [phone]   [phone]  OFFICE PROGRESS NOTE  Briscoe Deutscher, MD [address] [place] [address]  DIAGNOSIS:  Stage IV (T2a, N1, M1b) non-small cell lung cancer, adenocarcinoma, negative EGFR mutation but equivocal EGFR amplification, negative ALK gene translocation and negative ROS 1 but with PDL-1 expression 100% presented with left lower lobe lung mass in addition to left hilar adenopathy and solitary metastatic brain lesion diagnosed in December 2016.  PRIOR THERAPY: 1) status post stereotactic radiotherapy and surgical resection diagnosed in December 2016. 2) status post video bronchoscopy with left VATS and wedge resection of the left upper lobe and left lower lobe superior segmentectomy with lymph node dissection under the care of Dr. Servando Snare on 01/14/2015.  CURRENT THERAPY: First-line treatment with immunotherapy with Ketruda (pembrolizumab) 200 mg IV every 3 weeks status post 24 cycles. First cycle was given 02/18/2015.  INTERVAL HISTORY: Timothy Obrien 56 y.o. male returns to the clinic today for routine follow-up visit. The patient is feeling fine today with no specific complaints. He continues to tolerate his treatment with [place] (pembrolizumab) fairly well. He denied having any chest pain, shortness breath, cough or hemoptysis. He denied having any fever or chills. He has no nausea, vomiting, diarrhea or constipation. He denied having any skin rash. For evaluation before starting cycle #25.   MEDICAL HISTORY: Past Medical History:  Diagnosis Date  . Bronchitis   . Chronic fatigue 09/17/2015  . Depression 02/11/2016  . Hypertension 12/31/2015  . Lung cancer ([place])    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  . Pneumonia   . Seizures ([place])    last 12/11/14  . Shortness of breath dyspnea    with exertion  . Tobacco abuse     ALLERGIES:  has No Known Allergies.  MEDICATIONS:  Current Outpatient  Prescriptions  Medication Sig Dispense Refill  . mirtazapine (REMERON) 30 MG tablet Take 1 tablet (30 mg total) by mouth at bedtime. 90 tablet 0  . sodium chloride 0.9 % SOLN 50 mL with pembrolizumab 100 MG/4ML SOLN 200 mg Inject 200 mg into the vein every 21 ( twenty-one) days. 12 Dose 0   No current facility-administered medications for this visit.     SURGICAL HISTORY:  Past Surgical History:  Procedure Laterality Date  . APPLICATION OF CRANIAL NAVIGATION N/A 12/26/2014   Procedure: APPLICATION OF CRANIAL NAVIGATION;  Surgeon: Kevan Ny Ditty, MD;  Location: [facility];  Service: Neurosurgery;  Laterality: N/A;  . CRANIOTOMY N/A 12/26/2014   Procedure: CRANIOTOMY TUMOR EXCISION with BrainLab;  Surgeon: Kevan Ny Ditty, MD;  Location: [facility] NEUR[facility] ORS;  Service: Neurosurgery;  Laterality: N/A;  CRANIOTOMY TUMOR EXCISION with Stealth  . HERNIA REPAIR    . VASECTOMY    . VIDEO ASSISTED THORACOSCOPY (VATS)/WEDGE RESECTION Left 01/14/2015   Procedure: VIDEO ASSISTED THORACOSCOPY (VATS)/WEDGE RESECTION, Superior segmentectomy left  lower lobe, wedge resection of left upper lobe, multiple lymph node disection, On Q insertion.;  Surgeon: Grace Isaac, MD;  Location: [facility];  Service: Thoracic;  Laterality: Left;  Marland Kitchen VIDEO BRONCHOSCOPY Bilateral 12/16/2014   Procedure: VIDEO BRONCHOSCOPY WITH FLUORO;  Surgeon: Collene Gobble, MD;  Location: [facility];  Service: Cardiopulmonary;  Laterality: Bilateral;  . VIDEO BRONCHOSCOPY N/A 01/14/2015   Procedure: VIDEO BRONCHOSCOPY;  Surgeon: Grace Isaac, MD;  Location: [facility] OR;  Service: Thoracic;  Laterality: N/A;    REVIEW OF SYSTEMS:  A comprehensive review  of systems was negative.   PHYSICAL EXAMINATION: General appearance: alert, cooperative and no distress Head: Normocephalic, without obvious abnormality, atraumatic Neck: no adenopathy, no JVD, supple, symmetrical, trachea midline and thyroid not enlarged, symmetric, no  tenderness/mass/nodules Lymph nodes: Cervical, supraclavicular, and axillary nodes normal. Resp: clear to auscultation bilaterally Back: symmetric, no curvature. ROM normal. No CVA tenderness. Cardio: regular rate and rhythm, S1, S2 normal, no murmur, click, rub or gallop GI: soft, non-tender; bowel sounds normal; no masses,  no organomegaly Extremities: extremities normal, atraumatic, no cyanosis or edema  ECOG PERFORMANCE STATUS: 0 - Asymptomatic  Blood pressure 118/89, pulse 70, temperature 97.8 F (36.6 C), temperature source Oral, resp. rate 18, height 6' (1.829 m), weight 185 lb 14.4 oz (84.3 kg), SpO2 97 %.  LABORATORY DATA: Lab Results  Component Value Date   WBC 7.3 06/16/2016   HGB 15.2 06/16/2016   HCT 44.4 06/16/2016   MCV 95.0 06/16/2016   PLT 282 06/16/2016      Chemistry      Component Value Date/Time   NA 140 05/26/2016 0834   K 4.1 05/26/2016 0834   CL 109 01/16/2015 0450   CO2 20 (L) 05/26/2016 0834   BUN 15.3 05/26/2016 0834   CREATININE 1.0 05/26/2016 0834      Component Value Date/Time   CALCIUM 9.0 05/26/2016 0834   ALKPHOS 111 05/26/2016 0834   AST 23 05/26/2016 0834   ALT 18 05/26/2016 0834   BILITOT 0.41 05/26/2016 0834       RADIOGRAPHIC STUDIES: No results found.  ASSESSMENT AND PLAN:  This is a very pleasant 56 years old white male with metastatic non-small cell lung cancer, adenocarcinoma with positive PDL 1 expression of 100%. He is currently on treatment with Ketruda (pembrolizumab) every 3 weeks is status post 24 cycles and has been tolerating his treatment fairly well. I recommended for him to proceed with cycle #25 today as scheduled. I will see him back for follow-up visit in 3 weeks for evaluation before starting the next dose of his treatment. He was advised to call immediately if he has any concerning symptoms in the interval. The patient voices understanding of current disease status and treatment options and is in agreement  with the current care plan. All questions were answered. The patient knows to call the clinic with any problems, questions or concerns. We can certainly see the patient much sooner if necessary. I spent 10 minutes counseling the patient face to face. The total time spent in the appointment was 15 minutes.  Disclaimer: This note was dictated with voice recognition software. Similar sounding words can inadvertently be transcribed and may not be corrected upon review.

## 2016-07-01 ENCOUNTER — Other Ambulatory Visit: Payer: Self-pay | Admitting: *Deleted

## 2016-07-01 DIAGNOSIS — C7931 Secondary malignant neoplasm of brain: Secondary | ICD-10-CM

## 2016-07-01 DIAGNOSIS — C7949 Secondary malignant neoplasm of other parts of nervous system: Principal | ICD-10-CM

## 2016-07-07 ENCOUNTER — Ambulatory Visit (HOSPITAL_BASED_OUTPATIENT_CLINIC_OR_DEPARTMENT_OTHER): Payer: BLUE CROSS/BLUE SHIELD | Admitting: Internal Medicine

## 2016-07-07 ENCOUNTER — Telehealth: Payer: Self-pay | Admitting: Internal Medicine

## 2016-07-07 ENCOUNTER — Ambulatory Visit (HOSPITAL_BASED_OUTPATIENT_CLINIC_OR_DEPARTMENT_OTHER): Payer: BLUE CROSS/BLUE SHIELD

## 2016-07-07 ENCOUNTER — Other Ambulatory Visit (HOSPITAL_BASED_OUTPATIENT_CLINIC_OR_DEPARTMENT_OTHER): Payer: BLUE CROSS/BLUE SHIELD

## 2016-07-07 VITALS — BP 139/82 | HR 76 | Temp 98.4°F | Resp 18 | Ht 72.0 in | Wt 185.9 lb

## 2016-07-07 DIAGNOSIS — C7931 Secondary malignant neoplasm of brain: Secondary | ICD-10-CM | POA: Diagnosis not present

## 2016-07-07 DIAGNOSIS — Z5112 Encounter for antineoplastic immunotherapy: Secondary | ICD-10-CM | POA: Diagnosis not present

## 2016-07-07 DIAGNOSIS — C3432 Malignant neoplasm of lower lobe, left bronchus or lung: Secondary | ICD-10-CM

## 2016-07-07 LAB — CBC WITH DIFFERENTIAL/PLATELET
BASO%: 1.1 % (ref 0.0–2.0)
BASOS ABS: 0.1 10*3/uL (ref 0.0–0.1)
EOS%: 3.2 % (ref 0.0–7.0)
Eosinophils Absolute: 0.2 10*3/uL (ref 0.0–0.5)
HCT: 46.4 % (ref 38.4–49.9)
HGB: 15.7 g/dL (ref 13.0–17.1)
LYMPH#: 1.6 10*3/uL (ref 0.9–3.3)
LYMPH%: 24.1 % (ref 14.0–49.0)
MCH: 32.5 pg (ref 27.2–33.4)
MCHC: 34 g/dL (ref 32.0–36.0)
MCV: 95.6 fL (ref 79.3–98.0)
MONO#: 0.7 10*3/uL (ref 0.1–0.9)
MONO%: 10 % (ref 0.0–14.0)
NEUT#: 4.1 10*3/uL (ref 1.5–6.5)
NEUT%: 61.6 % (ref 39.0–75.0)
PLATELETS: 265 10*3/uL (ref 140–400)
RBC: 4.85 10*6/uL (ref 4.20–5.82)
RDW: 13.4 % (ref 11.0–14.6)
WBC: 6.6 10*3/uL (ref 4.0–10.3)

## 2016-07-07 LAB — COMPREHENSIVE METABOLIC PANEL
ALT: 19 U/L (ref 0–55)
ANION GAP: 8 meq/L (ref 3–11)
AST: 21 U/L (ref 5–34)
Albumin: 3.9 g/dL (ref 3.5–5.0)
Alkaline Phosphatase: 108 U/L (ref 40–150)
BUN: 16.9 mg/dL (ref 7.0–26.0)
CALCIUM: 9.2 mg/dL (ref 8.4–10.4)
CHLORIDE: 109 meq/L (ref 98–109)
CO2: 21 mEq/L — ABNORMAL LOW (ref 22–29)
Creatinine: 1.1 mg/dL (ref 0.7–1.3)
EGFR: 80 mL/min/{1.73_m2} — ABNORMAL LOW (ref >90)
Glucose: 88 mg/dl (ref 70–140)
POTASSIUM: 4.3 meq/L (ref 3.5–5.1)
Sodium: 138 mEq/L (ref 136–145)
Total Bilirubin: 0.31 mg/dL (ref 0.20–1.20)
Total Protein: 7.2 g/dL (ref 6.4–8.3)

## 2016-07-07 MED ORDER — SODIUM CHLORIDE 0.9 % IV SOLN
Freq: Once | INTRAVENOUS | Status: AC
  Administered 2016-07-07: 12:00:00 via INTRAVENOUS

## 2016-07-07 MED ORDER — PEMBROLIZUMAB CHEMO INJECTION 100 MG/4ML
200.0000 mg | Freq: Once | INTRAVENOUS | Status: AC
  Administered 2016-07-07: 200 mg via INTRAVENOUS
  Filled 2016-07-07: qty 8

## 2016-07-07 NOTE — Progress Notes (Addendum)
Timothy Obrien. Grondin 56 y.o. man with a left frontal brain metastasis from non-small cell lung cancer of the left lower lobe of the lung radiation completed 12-25-14  review 07-21-16 MRI brain w wo contrast, FU.  Headache:No Pain: Dizziness:No Nausea/Vomiting/Ataxia:No Visional changes(Blurred/Diplopia (double vision), blind spots and peripheral vision changes):Wears glasses for reading, Np problems Ring in ears:No Fatigue:Denies fatigue Cognitive changes:Alert and oriented x 3. Weight: Wt Readings from Last 3 Encounters:  07/28/16 184 lb 3.2 oz (83.6 kg)  07/28/16 183 lb (83 kg)  07/07/16 185 lb 14.4 oz (84.3 kg)   Appetite:Good Imaging:07-21-16 MRI w wo contrast Lab:06-16-16 Cmet, CBCw diff, TSH    06-16-16 Saw Dr. Lendon Collar is currently on treatment with [name] (pembrolizumab) every 3 weeks is status post 24 cycles and has been tolerating his treatment fairly well.    I recommended for him to proceed with cycle #25 today as scheduled.    I will see him back for follow-up visit in 3 weeks for evaluation before starting the next dose of his treatment.    BP 135/90   Pulse 64   Temp 97.6 F (36.4 C) (Oral)   Resp 18   Ht (!) 6" (0.152 m)   Wt 184 lb 3.2 oz (83.6 kg)   SpO2 98%   BMI 3597.41 kg/m

## 2016-07-07 NOTE — Patient Instructions (Signed)
[facility] Discharge Instructions for Patients Receiving Chemotherapy  Today you received the following chemotherapy agents: Keytruda   To help prevent nausea and vomiting after your treatment, we encourage you to take your nausea medication as directed    If you develop nausea and vomiting that is not controlled by your nausea medication, call the clinic.   BELOW ARE SYMPTOMS THAT SHOULD BE REPORTED IMMEDIATELY:  *FEVER GREATER THAN 100.5 F  *CHILLS WITH OR WITHOUT FEVER  NAUSEA AND VOMITING THAT IS NOT CONTROLLED WITH YOUR NAUSEA MEDICATION  *UNUSUAL SHORTNESS OF BREATH  *UNUSUAL BRUISING OR BLEEDING  TENDERNESS IN MOUTH AND THROAT WITH OR WITHOUT PRESENCE OF ULCERS  *URINARY PROBLEMS  *BOWEL PROBLEMS  UNUSUAL RASH Items with * indicate a potential emergency and should be followed up as soon as possible.  Feel free to call the clinic you have any questions or concerns. The clinic phone number is [phone].  Please show the CHEMO ALERT CARD at check-in to the Emergency Department and triage nurse.   

## 2016-07-07 NOTE — Progress Notes (Signed)
[facility] Telephone:(336) [phone]   Fax:(336) [phone]  OFFICE PROGRESS NOTE  Briscoe Deutscher, MD [address] [place] [address]  DIAGNOSIS:  Stage IV (T2a, N1, M1b) non-small cell lung cancer, adenocarcinoma, negative EGFR mutation but equivocal EGFR amplification, negative ALK gene translocation and negative ROS 1 but with PDL-1 expression 100% presented with left lower lobe lung mass in addition to left hilar adenopathy and solitary metastatic brain lesion diagnosed in December 2016.  PRIOR THERAPY: 1) status post stereotactic radiotherapy and surgical resection diagnosed in December 2016. 2) status post video bronchoscopy with left VATS and wedge resection of the left upper lobe and left lower lobe superior segmentectomy with lymph node dissection under the care of Dr. Servando Snare on 01/14/2015.  CURRENT THERAPY: First-line treatment with immunotherapy with Ketruda (pembrolizumab) 200 mg IV every 3 weeks status post 25 cycles. First cycle was given 02/18/2015.  INTERVAL HISTORY: Timothy Obrien 56 y.o. male returns to the clinic today for follow-up visit. The patient is feeling fine today was no specific complaints. He is tolerating his current treatment with immunotherapy fairly well. He denied having any chest pain, shortness of breath, cough or hemoptysis. He has no nausea, vomiting, diarrhea or constipation. He has no weight loss or night sweats. He is here today for evaluation before starting cycle #26.  MEDICAL HISTORY: Past Medical History:  Diagnosis Date  . Bronchitis   . Chronic fatigue 09/17/2015  . Depression 02/11/2016  . Hypertension 12/31/2015  . Lung cancer ([place])    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  . Pneumonia   . Seizures ([place])    last 12/11/14  . Shortness of breath dyspnea    with exertion  . Tobacco abuse     ALLERGIES:  has No Known Allergies.  MEDICATIONS:  Current Outpatient Prescriptions  Medication Sig Dispense  Refill  . mirtazapine (REMERON) 30 MG tablet Take 1 tablet (30 mg total) by mouth at bedtime. 90 tablet 0  . sodium chloride 0.9 % SOLN 50 mL with pembrolizumab 100 MG/4ML SOLN 200 mg Inject 200 mg into the vein every 21 ( twenty-one) days. 12 Dose 0   No current facility-administered medications for this visit.     SURGICAL HISTORY:  Past Surgical History:  Procedure Laterality Date  . APPLICATION OF CRANIAL NAVIGATION N/A 12/26/2014   Procedure: APPLICATION OF CRANIAL NAVIGATION;  Surgeon: Kevan Ny Ditty, MD;  Location: [facility] NEUR[facility]S;  Service: Neurosurgery;  Laterality: N/A;  . CRANIOTOMY N/A 12/26/2014   Procedure: CRANIOTOMY TUMOR EXCISION with BrainLab;  Surgeon: Kevan Ny Ditty, MD;  Location: [facility] NEURO ORS;  Service: Neurosurgery;  Laterality: N/A;  CRANIOTOMY TUMOR EXCISION with Stealth  . HERNIA REPAIR    . VASECTOMY    . VIDEO ASSISTED THORACOSCOPY (VATS)/WEDGE RESECTION Left 01/14/2015   Procedure: VIDEO ASSISTED THORACOSCOPY (VATS)/WEDGE RESECTION, Superior segmentectomy left  lower lobe, wedge resection of left upper lobe, multiple lymph node disection, On Q insertion.;  Surgeon: Grace Isaac, MD;  Location: [facility];  Service: Thoracic;  Laterality: Left;  Marland Kitchen VIDEO BRONCHOSCOPY Bilateral 12/16/2014   Procedure: VIDEO BRONCHOSCOPY WITH FLUORO;  Surgeon: Collene Gobble, MD;  Location: [facility];  Service: Cardiopulmonary;  Laterality: Bilateral;  . VIDEO BRONCHOSCOPY N/A 01/14/2015   Procedure: VIDEO BRONCHOSCOPY;  Surgeon: Grace Isaac, MD;  Location: [facility] OR;  Service: Thoracic;  Laterality: N/A;    REVIEW OF SYSTEMS:  A comprehensive review of systems was  negative.   PHYSICAL EXAMINATION: General appearance: alert, cooperative and no distress Head: Normocephalic, without obvious abnormality, atraumatic Neck: no adenopathy, no JVD, supple, symmetrical, trachea midline and thyroid not enlarged, symmetric, no tenderness/mass/nodules Lymph nodes: Cervical,  supraclavicular, and axillary nodes normal. Resp: clear to auscultation bilaterally Back: symmetric, no curvature. ROM normal. No CVA tenderness. Cardio: regular rate and rhythm, S1, S2 normal, no murmur, click, rub or gallop GI: soft, non-tender; bowel sounds normal; no masses,  no organomegaly Extremities: extremities normal, atraumatic, no cyanosis or edema  ECOG PERFORMANCE STATUS: 0 - Asymptomatic  Blood pressure 139/82, pulse 76, temperature 98.4 F (36.9 C), temperature source Oral, resp. rate 18, height 6' (1.829 m), weight 185 lb 14.4 oz (84.3 kg), SpO2 97 %.  LABORATORY DATA: Lab Results  Component Value Date   WBC 6.6 07/07/2016   HGB 15.7 07/07/2016   HCT 46.4 07/07/2016   MCV 95.6 07/07/2016   PLT 265 07/07/2016      Chemistry      Component Value Date/Time   NA 140 06/16/2016 0851   K 4.3 06/16/2016 0851   CL 109 01/16/2015 0450   CO2 19 (L) 06/16/2016 0851   BUN 16.9 06/16/2016 0851   CREATININE 1.0 06/16/2016 0851      Component Value Date/Time   CALCIUM 9.0 06/16/2016 0851   ALKPHOS 108 06/16/2016 0851   AST 27 06/16/2016 0851   ALT 23 06/16/2016 0851   BILITOT 0.42 06/16/2016 0851       RADIOGRAPHIC STUDIES: No results found.  ASSESSMENT AND PLAN:  This is a very pleasant 56 years old white male with metastatic non-small cell lung cancer, adenocarcinoma with positive PDL 1 expression of 100%. He is currently undergoing treatment with Ketruda (pembrolizumab) 200 mg IV every 3 weeks, status post 25 cycles. The patient has been tolerating the treatment well with no significant adverse effects. I recommended for the patient to proceed with cycle #26 today as scheduled. I will see him back for follow-up visit in 3 weeks for evaluation after repeating CT scan of the chest, abdomen and pelvis for restaging of his disease. The patient was advised to call immediately if he has any concerning symptoms in the interval. The patient voices understanding of  current disease status and treatment options and is in agreement with the current care plan. All questions were answered. The patient knows to call the clinic with any problems, questions or concerns. We can certainly see the patient much sooner if necessary. I spent 10 minutes counseling the patient face to face. The total time spent in the appointment was 15 minutes.  Disclaimer: This note was dictated with voice recognition software. Similar sounding words can inadvertently be transcribed and may not be corrected upon review.

## 2016-07-07 NOTE — Telephone Encounter (Signed)
Patient was given 2 bottles of oral Contrast with a copy of the instructions. Patient was given a copy of the AVS report and appointment schedule per 07/07/16 los.

## 2016-07-08 ENCOUNTER — Encounter: Payer: Self-pay | Admitting: Internal Medicine

## 2016-07-21 ENCOUNTER — Ambulatory Visit
Admission: RE | Admit: 2016-07-21 | Discharge: 2016-07-21 | Disposition: A | Discharge status: Home / Self Care | Payer: BLUE CROSS/BLUE SHIELD | Source: Ambulatory Visit | Attending: Radiation Oncology | Admitting: Radiation Oncology

## 2016-07-21 DIAGNOSIS — C7931 Secondary malignant neoplasm of brain: Secondary | ICD-10-CM

## 2016-07-21 DIAGNOSIS — C7949 Secondary malignant neoplasm of other parts of nervous system: Principal | ICD-10-CM

## 2016-07-21 MED ORDER — GADOBENATE DIMEGLUMINE 529 MG/ML IV SOLN
17.0000 mL | Freq: Once | INTRAVENOUS | Status: AC | PRN
  Administered 2016-07-21: 17 mL via INTRAVENOUS

## 2016-07-25 ENCOUNTER — Ambulatory Visit (HOSPITAL_COMMUNITY)
Admission: RE | Admit: 2016-07-25 | Discharge: 2016-07-25 | Disposition: A | Discharge status: Home / Self Care | Payer: BLUE CROSS/BLUE SHIELD | Source: Ambulatory Visit | Attending: Internal Medicine | Admitting: Internal Medicine

## 2016-07-25 ENCOUNTER — Encounter (HOSPITAL_COMMUNITY): Payer: Self-pay

## 2016-07-25 ENCOUNTER — Ambulatory Visit: Payer: Self-pay | Admitting: Radiation Oncology

## 2016-07-25 DIAGNOSIS — C7931 Secondary malignant neoplasm of brain: Secondary | ICD-10-CM | POA: Diagnosis not present

## 2016-07-25 DIAGNOSIS — I700 Atherosclerosis of aorta: Secondary | ICD-10-CM | POA: Diagnosis not present

## 2016-07-25 DIAGNOSIS — K5730 Diverticulosis of large intestine without perforation or abscess without bleeding: Secondary | ICD-10-CM | POA: Diagnosis not present

## 2016-07-25 DIAGNOSIS — R918 Other nonspecific abnormal finding of lung field: Secondary | ICD-10-CM | POA: Insufficient documentation

## 2016-07-25 DIAGNOSIS — Z5112 Encounter for antineoplastic immunotherapy: Secondary | ICD-10-CM

## 2016-07-25 DIAGNOSIS — I2510 Atherosclerotic heart disease of native coronary artery without angina pectoris: Secondary | ICD-10-CM | POA: Insufficient documentation

## 2016-07-25 DIAGNOSIS — R911 Solitary pulmonary nodule: Secondary | ICD-10-CM | POA: Insufficient documentation

## 2016-07-25 DIAGNOSIS — M5137 Other intervertebral disc degeneration, lumbosacral region: Secondary | ICD-10-CM | POA: Insufficient documentation

## 2016-07-25 DIAGNOSIS — C3432 Malignant neoplasm of lower lobe, left bronchus or lung: Secondary | ICD-10-CM | POA: Insufficient documentation

## 2016-07-25 DIAGNOSIS — N2889 Other specified disorders of kidney and ureter: Secondary | ICD-10-CM | POA: Insufficient documentation

## 2016-07-25 DIAGNOSIS — J432 Centrilobular emphysema: Secondary | ICD-10-CM | POA: Diagnosis not present

## 2016-07-25 DIAGNOSIS — K7689 Other specified diseases of liver: Secondary | ICD-10-CM | POA: Insufficient documentation

## 2016-07-25 DIAGNOSIS — M47896 Other spondylosis, lumbar region: Secondary | ICD-10-CM | POA: Insufficient documentation

## 2016-07-25 MED ORDER — IOPAMIDOL (ISOVUE-300) INJECTION 61%
100.0000 mL | Freq: Once | INTRAVENOUS | Status: AC | PRN
  Administered 2016-07-25: 100 mL via INTRAVENOUS

## 2016-07-25 MED ORDER — IOPAMIDOL (ISOVUE-300) INJECTION 61%
INTRAVENOUS | Status: AC
  Filled 2016-07-25: qty 100

## 2016-07-28 ENCOUNTER — Encounter: Payer: Self-pay | Admitting: Internal Medicine

## 2016-07-28 ENCOUNTER — Encounter: Payer: Self-pay | Admitting: Urology

## 2016-07-28 ENCOUNTER — Other Ambulatory Visit (HOSPITAL_BASED_OUTPATIENT_CLINIC_OR_DEPARTMENT_OTHER): Payer: BLUE CROSS/BLUE SHIELD

## 2016-07-28 ENCOUNTER — Ambulatory Visit (HOSPITAL_BASED_OUTPATIENT_CLINIC_OR_DEPARTMENT_OTHER): Payer: BLUE CROSS/BLUE SHIELD | Admitting: Internal Medicine

## 2016-07-28 ENCOUNTER — Ambulatory Visit
Admission: RE | Admit: 2016-07-28 | Discharge: 2016-07-28 | Disposition: A | Discharge status: Home / Self Care | Payer: BLUE CROSS/BLUE SHIELD | Source: Ambulatory Visit | Attending: Urology | Admitting: Urology

## 2016-07-28 ENCOUNTER — Telehealth: Payer: Self-pay | Admitting: Internal Medicine

## 2016-07-28 ENCOUNTER — Inpatient Hospital Stay
Admission: RE | Admit: 2016-07-28 | Discharge: 2016-07-28 | Disposition: A | Discharge status: Home / Self Care | Payer: BLUE CROSS/BLUE SHIELD | Source: Ambulatory Visit | Attending: Urology | Admitting: Urology

## 2016-07-28 ENCOUNTER — Ambulatory Visit (HOSPITAL_BASED_OUTPATIENT_CLINIC_OR_DEPARTMENT_OTHER): Payer: BLUE CROSS/BLUE SHIELD

## 2016-07-28 VITALS — BP 135/90 | HR 64 | Temp 97.6°F | Resp 18 | Ht 72.0 in | Wt 183.0 lb

## 2016-07-28 VITALS — BP 152/92 | HR 64

## 2016-07-28 VITALS — BP 135/90 | HR 64 | Temp 97.6°F | Resp 18 | Ht <= 58 in | Wt 184.2 lb

## 2016-07-28 DIAGNOSIS — Z87891 Personal history of nicotine dependence: Secondary | ICD-10-CM | POA: Diagnosis not present

## 2016-07-28 DIAGNOSIS — F329 Major depressive disorder, single episode, unspecified: Secondary | ICD-10-CM

## 2016-07-28 DIAGNOSIS — Z85118 Personal history of other malignant neoplasm of bronchus and lung: Secondary | ICD-10-CM | POA: Diagnosis not present

## 2016-07-28 DIAGNOSIS — C7931 Secondary malignant neoplasm of brain: Secondary | ICD-10-CM | POA: Insufficient documentation

## 2016-07-28 DIAGNOSIS — I10 Essential (primary) hypertension: Secondary | ICD-10-CM | POA: Diagnosis not present

## 2016-07-28 DIAGNOSIS — Z9889 Other specified postprocedural states: Secondary | ICD-10-CM | POA: Diagnosis not present

## 2016-07-28 DIAGNOSIS — Z5112 Encounter for antineoplastic immunotherapy: Secondary | ICD-10-CM

## 2016-07-28 DIAGNOSIS — C3432 Malignant neoplasm of lower lobe, left bronchus or lung: Secondary | ICD-10-CM

## 2016-07-28 DIAGNOSIS — F32A Depression, unspecified: Secondary | ICD-10-CM

## 2016-07-28 LAB — COMPREHENSIVE METABOLIC PANEL
ALBUMIN: 3.8 g/dL (ref 3.5–5.0)
ALK PHOS: 99 U/L (ref 40–150)
ALT: 19 U/L (ref 0–55)
AST: 21 U/L (ref 5–34)
Anion Gap: 11 mEq/L (ref 3–11)
BILIRUBIN TOTAL: 0.33 mg/dL (ref 0.20–1.20)
BUN: 17.9 mg/dL (ref 7.0–26.0)
CALCIUM: 9 mg/dL (ref 8.4–10.4)
CO2: 19 mEq/L — ABNORMAL LOW (ref 22–29)
CREATININE: 1.1 mg/dL (ref 0.7–1.3)
Chloride: 108 mEq/L (ref 98–109)
EGFR: 73 mL/min/{1.73_m2} — ABNORMAL LOW (ref >90)
GLUCOSE: 93 mg/dL (ref 70–140)
Potassium: 4.4 mEq/L (ref 3.5–5.1)
SODIUM: 138 meq/L (ref 136–145)
TOTAL PROTEIN: 7 g/dL (ref 6.4–8.3)

## 2016-07-28 LAB — CBC WITH DIFFERENTIAL/PLATELET
BASO%: 0.8 % (ref 0.0–2.0)
Basophils Absolute: 0.1 10*3/uL (ref 0.0–0.1)
EOS ABS: 0.3 10*3/uL (ref 0.0–0.5)
EOS%: 3.5 % (ref 0.0–7.0)
HEMATOCRIT: 45.6 % (ref 38.4–49.9)
HEMOGLOBIN: 15.3 g/dL (ref 13.0–17.1)
LYMPH#: 1.9 10*3/uL (ref 0.9–3.3)
LYMPH%: 26.2 % (ref 14.0–49.0)
MCH: 32.5 pg (ref 27.2–33.4)
MCHC: 33.6 g/dL (ref 32.0–36.0)
MCV: 96.8 fL (ref 79.3–98.0)
MONO#: 0.8 10*3/uL (ref 0.1–0.9)
MONO%: 10.9 % (ref 0.0–14.0)
NEUT%: 58.6 % (ref 39.0–75.0)
NEUTROS ABS: 4.2 10*3/uL (ref 1.5–6.5)
Platelets: 246 10*3/uL (ref 140–400)
RBC: 4.71 10*6/uL (ref 4.20–5.82)
RDW: 12.9 % (ref 11.0–14.6)
WBC: 7.1 10*3/uL (ref 4.0–10.3)

## 2016-07-28 MED ORDER — SODIUM CHLORIDE 0.9 % IV SOLN
200.0000 mg | Freq: Once | INTRAVENOUS | Status: AC
  Administered 2016-07-28: 200 mg via INTRAVENOUS
  Filled 2016-07-28: qty 8

## 2016-07-28 MED ORDER — SODIUM CHLORIDE 0.9 % IV SOLN
Freq: Once | INTRAVENOUS | Status: AC
  Administered 2016-07-28: 10:00:00 via INTRAVENOUS

## 2016-07-28 MED ORDER — PENTOXIFYLLINE ER 400 MG PO TBCR: 400.0000 mg | EXTENDED_RELEASE_TABLET | Freq: Two times a day (BID) | ORAL | 5 refills | Status: DC

## 2016-07-28 NOTE — Telephone Encounter (Signed)
Scheduled appt per 7/19 los - Patient called to the treatment area before schedule was finished - patient to get new schedule in the treatment area.

## 2016-07-28 NOTE — Progress Notes (Signed)
[place] [phone]   [phone]  OFFICE PROGRESS NOTE  Briscoe Deutscher, MD [address] [place] [address]  DIAGNOSIS:  Stage IV (T2a, N1, M1b) non-small cell lung cancer, adenocarcinoma, negative EGFR mutation but equivocal EGFR amplification, negative ALK gene translocation and negative ROS 1 but with PDL-1 expression 100% presented with left lower lobe lung mass in addition to left hilar adenopathy and solitary metastatic brain lesion diagnosed in December 2016.  PRIOR THERAPY: 1) status post stereotactic radiotherapy and surgical resection diagnosed in December 2016. 2) status post video bronchoscopy with left VATS and wedge resection of the left upper lobe and left lower lobe superior segmentectomy with lymph node dissection under the care of Dr. Servando Snare on 01/14/2015.  CURRENT THERAPY: First-line treatment with immunotherapy with Ketruda (pembrolizumab) 200 mg IV every 3 weeks status post 26 cycles. First cycle was given 02/18/2015.  INTERVAL HISTORY: Timothy Obrien 56 y.o. male returns to the clinic today for follow-up visit accompanied by his ex-wife. The patient is feeling fine today with no specific complaints. He was a little bit anxious about his scan results. He denied having any chest pain, shortness of breath, cough or hemoptysis. He denied having any fever or chills. He has no nausea, vomiting, diarrhea or constipation. The patient has no weight loss or night sweats. He has been tolerating his treatment with [name] (pembrolizumab) fairly well. He had repeat MRI of the brain as well as CT scan of the chest, abdomen and pelvis performed recently and he is here for evaluation and discussion of his scan results and treatment options.  MEDICAL HISTORY: Past Medical History:  Diagnosis Date  . Bronchitis   . Chronic fatigue 09/17/2015  . Depression 02/11/2016  . Hypertension 12/31/2015  . Lung cancer ([place])    non small cell  lung ca with brain met  . Pneumonia   . Seizures ([place])    last 12/11/14  . Shortness of breath dyspnea    with exertion  . Tobacco abuse     ALLERGIES:  has No Known Allergies.  MEDICATIONS:  Current Outpatient Prescriptions  Medication Sig Dispense Refill  . mirtazapine (REMERON) 30 MG tablet Take 1 tablet (30 mg total) by mouth at bedtime. 90 tablet 0  . sodium chloride 0.9 % SOLN 50 mL with pembrolizumab 100 MG/4ML SOLN 200 mg Inject 200 mg into the vein every 21 ( twenty-one) days. 12 Dose 0   No current facility-administered medications for this visit.     SURGICAL HISTORY:  Past Surgical History:  Procedure Laterality Date  . APPLICATION OF CRANIAL NAVIGATION N/A 12/26/2014   Procedure: APPLICATION OF CRANIAL NAVIGATION;  Surgeon: Kevan Ny Ditty, MD;  Location: [facility];  Service: Neurosurgery;  Laterality: N/A;  . CRANIOTOMY N/A 12/26/2014   Procedure: CRANIOTOMY TUMOR EXCISION with BrainLab;  Surgeon: Kevan Ny Ditty, MD;  Location: [facility];  Service: Neurosurgery;  Laterality: N/A;  CRANIOTOMY TUMOR EXCISION with [facility]  . HERNIA REPAIR    . VASECTOMY    . VIDEO ASSISTED THORACOSCOPY (VATS)/WEDGE RESECTION Left 01/14/2015   Procedure: VIDEO ASSISTED THORACOSCOPY (VATS)/WEDGE RESECTION, Superior segmentectomy left  lower lobe, wedge resection of left upper lobe, multiple lymph node disection, On Q insertion.;  Surgeon: Grace Isaac, MD;  Location: [facility];  Service: Thoracic;  Laterality: Left;  Marland Kitchen VIDEO BRONCHOSCOPY Bilateral 12/16/2014   Procedure: VIDEO BRONCHOSCOPY WITH FLUORO;  Surgeon: Collene Gobble, MD;  Location:  [facility];  Service: Cardiopulmonary;  Laterality: Bilateral;  . VIDEO BRONCHOSCOPY N/A 01/14/2015   Procedure: VIDEO BRONCHOSCOPY;  Surgeon: Grace Isaac, MD;  Location: [facility];  Service: Thoracic;  Laterality: N/A;    REVIEW OF SYSTEMS:  Constitutional: negative Eyes: negative Ears, nose, mouth, throat, and face:  negative Respiratory: negative Cardiovascular: negative Gastrointestinal: negative Genitourinary:negative Integument/breast: negative Hematologic/lymphatic: negative Musculoskeletal:negative Neurological: negative Behavioral/Psych: positive for anxiety and depression Endocrine: negative Allergic/Immunologic: negative   PHYSICAL EXAMINATION: General appearance: alert, cooperative and no distress Head: Normocephalic, without obvious abnormality, atraumatic Neck: no adenopathy, no JVD, supple, symmetrical, trachea midline and thyroid not enlarged, symmetric, no tenderness/mass/nodules Lymph nodes: Cervical, supraclavicular, and axillary nodes normal. Resp: clear to auscultation bilaterally Back: symmetric, no curvature. ROM normal. No CVA tenderness. Cardio: regular rate and rhythm, S1, S2 normal, no murmur, click, rub or gallop GI: soft, non-tender; bowel sounds normal; no masses,  no organomegaly Extremities: extremities normal, atraumatic, no cyanosis or edema Neurologic: Alert and oriented X 3, normal strength and tone. Normal symmetric reflexes. Normal coordination and gait  ECOG PERFORMANCE STATUS: 0 - Asymptomatic  Blood pressure 135/90, pulse 64, temperature 97.6 F (36.4 C), temperature source Oral, resp. rate 18, height 6' (1.829 m), weight 183 lb (83 kg), SpO2 100 %.  LABORATORY DATA: Lab Results  Component Value Date   WBC 7.1 07/28/2016   HGB 15.3 07/28/2016   HCT 45.6 07/28/2016   MCV 96.8 07/28/2016   PLT 246 07/28/2016      Chemistry      Component Value Date/Time   NA 138 07/07/2016 1046   K 4.3 07/07/2016 1046   CL 109 01/16/2015 0450   CO2 21 (L) 07/07/2016 1046   BUN 16.9 07/07/2016 1046   CREATININE 1.1 07/07/2016 1046      Component Value Date/Time   CALCIUM 9.2 07/07/2016 1046   ALKPHOS 108 07/07/2016 1046   AST 21 07/07/2016 1046   ALT 19 07/07/2016 1046   BILITOT 0.31 07/07/2016 1046       RADIOGRAPHIC STUDIES: Ct Chest W  Contrast  Result Date: 07/25/2016 CLINICAL DATA:  Lung cancer with brain metastatic disease, restaging. EXAM: CT CHEST, ABDOMEN, AND PELVIS WITH CONTRAST TECHNIQUE: Multidetector CT imaging of the chest, abdomen and pelvis was performed following the standard protocol during bolus administration of intravenous contrast. CONTRAST:  14m ISOVUE-300 IOPAMIDOL (ISOVUE-300) INJECTION 61% COMPARISON:  Multiple exams, including 04/11/2016 FINDINGS: CT CHEST FINDINGS Cardiovascular: Atherosclerotic calcification of the aortic arch and of the left anterior descending coronary artery. Mediastinum/Nodes: No pathologic adenopathy. Lungs/Pleura: New prominent centrilobular emphysema. New mild scarring, right middle lobe. New scarring medially in the left lower lobe. New new new rounded density medially in the left lower lobe along a region of bandlike scarring has a maximum thickness of 1.1 cm on image 97/4, previously 1.3 cm by my measurements. A nodule along the left posterior costophrenic angle measures 0.6 by 0.8 cm on image 146/4, previously 0.6 by 0.9 cm by my measurements. Musculoskeletal: Old left posterolateral healed rib fractures. CT ABDOMEN PELVIS FINDINGS Hepatobiliary: 5 mm nonspecific lesion in segment 6 of the liver, image 66/2, not appreciably changed. Contracted gallbladder. Pancreas: Unremarkable Spleen: Unremarkable Adrenals/Urinary Tract: Several stable hypodense right renal lesions are stable and statistically likely to be benign but technically nonspecific. Stomach/Bowel: Sigmoid colon diverticulosis. Vascular/Lymphatic: Aortoiliac atherosclerotic vascular disease. No pathologic adenopathy. Reproductive: Unremarkable Other: No supplemental non-categorized findings. Musculoskeletal: Lumbar spondylosis and degenerative disc disease at the L5-S1 level. Small umbilical hernia contains adipose tissue.  IMPRESSION: 1. Bandlike density in the left lower lobe with slight nodularity currently measuring 1.1 cm in  thickness, previously 1.3 cm. Stable small nodule along the costophrenic angle. No new or enlarging pulmonary nodules. 2. Several stable small hepatic and right renal hypodense lesions are technically nonspecific but statistically likely to be small cysts. 3. Aortic Atherosclerosis (ICD10-I70.0) and Emphysema (ICD10-J43.9). Coronary atherosclerosis. 4. Sigmoid colon diverticulosis. 5. Lumbar spondylosis and degenerative disc disease at L5-S1. Electronically Signed   By: Van Clines M.D.   On: 07/25/2016 10:37   Mr [name] UU Contrast  Result Date: 07/21/2016 CLINICAL DATA:  Metastatic lung cancer. Craniotomy with radiotherapy EXAM: MRI HEAD WITHOUT AND WITH CONTRAST TECHNIQUE: Multiplanar, multiecho pulse sequences of the brain and surrounding structures were obtained without and with intravenous contrast. CONTRAST:  30m MULTIHANCE GADOBENATE DIMEGLUMINE 529 MG/ML IV SOLN COMPARISON:  MRI 04/21/2016, 11/09/2015 FINDINGS: Brain: Left frontal parietal lesion shows progressive enhancement since prior studies. There is a rim of enhancement which is irregular. The enhancing lesion now measures 11 x 14 x 14 mm. Small amount of methemoglobin is also present within the lesion. Surrounding white matter hyperintensity slightly increased from the prior study. No new lesions identified. Ventricle size normal. No acute infarct. Vascular: Normal arterial flow void. Skull and upper cervical spine: Negative for metastatic disease. The left parietal craniotomy. Sinuses/Orbits: Mucous retention cyst right maxillary sinus. Normal orbit. Other: None IMPRESSION: Progressive enhancement of the lesion in the left frontal parietal lobe. Slight increase in surrounding edema. Findings could be due to treatment effect versus recurrent tumor. No new lesions. Electronically Signed   By: CFranchot GalloM.D.   On: 07/21/2016 09:34   Ct Abdomen Pelvis W Contrast  Result Date: 07/25/2016 CLINICAL DATA:  Lung cancer with brain  metastatic disease, restaging. EXAM: CT CHEST, ABDOMEN, AND PELVIS WITH CONTRAST TECHNIQUE: Multidetector CT imaging of the chest, abdomen and pelvis was performed following the standard protocol during bolus administration of intravenous contrast. CONTRAST:  1021mISOVUE-300 IOPAMIDOL (ISOVUE-300) INJECTION 61% COMPARISON:  Multiple exams, including 04/11/2016 FINDINGS: CT CHEST FINDINGS Cardiovascular: Atherosclerotic calcification of the aortic arch and of the left anterior descending coronary artery. Mediastinum/Nodes: No pathologic adenopathy. Lungs/Pleura: New prominent centrilobular emphysema. New mild scarring, right middle lobe. New scarring medially in the left lower lobe. New new new rounded density medially in the left lower lobe along a region of bandlike scarring has a maximum thickness of 1.1 cm on image 97/4, previously 1.3 cm by my measurements. A nodule along the left posterior costophrenic angle measures 0.6 by 0.8 cm on image 146/4, previously 0.6 by 0.9 cm by my measurements. Musculoskeletal: Old left posterolateral healed rib fractures. CT ABDOMEN PELVIS FINDINGS Hepatobiliary: 5 mm nonspecific lesion in segment 6 of the liver, image 66/2, not appreciably changed. Contracted gallbladder. Pancreas: Unremarkable Spleen: Unremarkable Adrenals/Urinary Tract: Several stable hypodense right renal lesions are stable and statistically likely to be benign but technically nonspecific. Stomach/Bowel: Sigmoid colon diverticulosis. Vascular/Lymphatic: Aortoiliac atherosclerotic vascular disease. No pathologic adenopathy. Reproductive: Unremarkable Other: No supplemental non-categorized findings. Musculoskeletal: Lumbar spondylosis and degenerative disc disease at the L5-S1 level. Small umbilical hernia contains adipose tissue. IMPRESSION: 1. Bandlike density in the left lower lobe with slight nodularity currently measuring 1.1 cm in thickness, previously 1.3 cm. Stable small nodule along the costophrenic  angle. No new or enlarging pulmonary nodules. 2. Several stable small hepatic and right renal hypodense lesions are technically nonspecific but statistically likely to be small cysts. 3. Aortic Atherosclerosis (ICD10-I70.0) and Emphysema (  ICD10-J43.9). Coronary atherosclerosis. 4. Sigmoid colon diverticulosis. 5. Lumbar spondylosis and degenerative disc disease at L5-S1. Electronically Signed   By: Van Clines M.D.   On: 07/25/2016 10:37    ASSESSMENT AND PLAN:  This is a very pleasant 56 years old white male with metastatic non-small cell lung cancer, adenocarcinoma with positive PDL 1 expression of 100%. He is currently undergoing treatment with Ketruda (pembrolizumab) 200 mg IV every 3 weeks, status post 26 cycles. The patient has been tolerating the treatment well with no significant adverse effects. He had repeat CT scan of the chest, abdomen and pelvis performed recently that showed no concerning findings for disease progression. He had bandlike density in the left lower lobe that actually improved compared to the previous scan. His MRI of the brain showed progressive enhancement of the lesion in the left frontal parietal lobe. He is scheduled to see radiation oncology later today for evaluation and discussion of option for management of these findings. I discussed the scan results with the patient and his ex-wife. I recommended for him to continue his current treatment with [place] (pembrolizumab) and the patient will proceed with cycle #27 today. For hypertension, I advised him to monitor his blood pressure closed at home and to consult with primary care physician for consideration of treatment if persisted to be high. For depression and anxiety, the patient will continue his current treatment with Remeron. He was advised to call immediately if he has any concerning symptoms in the interval. The patient voices understanding of current disease status and treatment options and is in  agreement with the current care plan. All questions were answered. The patient knows to call the clinic with any problems, questions or concerns. We can certainly see the patient much sooner if necessary.  Disclaimer: This note was dictated with voice recognition software. Similar sounding words can inadvertently be transcribed and may not be corrected upon review.

## 2016-07-28 NOTE — Progress Notes (Signed)
Per Dr. Julien Nordmann okay to proceed with b/p 152/92.

## 2016-07-28 NOTE — Progress Notes (Signed)
Radiation Oncology         [phone] ________________________________  Name: CECILIA NISHIKAWA MRN: [id number]  Date: 07/28/2016  DOB: 1960-07-03  Follow-Up Visit Note  CC: Briscoe Deutscher, MD  Ditty, Kevan Ny, *  Diagnosis:  56 y.o.  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 Brain metastasis ([place]) C79.31     Interval Since Last Radiation:    12/25/2014 Preop SRS Treatment: Left frontal 36 mm target was treated using 4 Dynamic Conformal Arcs to a prescription dose of 16 Gy.  ExacTrac registration was performed for each couch angle.    Narrative:  Mr. Edgecombe is a pleasant 56 y.o. 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 Julien Nordmann.  He was treated with preoperative SRS to a solitary brain metastasis in 12/2014, followed by surgical resection. He continues in surveillance and his disease has been stable on brain imaging. His previous MRI on 04/21/16 revealed stability in the size of the previously treated lesion with mild increase in enhancement of the cavity. Recent MRI imaging on 07/21/16 shows some progressive enhancement but stable size and no new lesions. He is currently receiving Keytruda as his systemic therapy and has received a total of 26 cycles to date.  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  He presents today with his Ex-wife.  On review of systems, the patient reports that he is doing well overall. He denies any chest pain, shortness of breath, cough, fevers, chills, night sweats, unintended weight changes. He denies any bowel or bladder disturbances, and denies abdominal pain,  nausea or vomiting. He denies headaches, visual or auditory disturbances, seizures, or tremor. He denies any new musculoskeletal or joint aches or pains, new skin lesions or concerns. A complete review of systems is obtained and is otherwise negative.   Past Medical History:  Past Medical History:  Diagnosis Date  . Bronchitis   . Chronic fatigue 09/17/2015  . Depression 02/11/2016  . Hypertension 12/31/2015  . Lung cancer ([place])    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  . Pneumonia   . Seizures ([place])    last 12/11/14  . Shortness of breath dyspnea    with exertion  . Tobacco abuse     Past Surgical History: Past Surgical History:  Procedure Laterality Date  . APPLICATION OF CRANIAL NAVIGATION N/A 12/26/2014   Procedure: APPLICATION OF CRANIAL NAVIGATION;  Surgeon: Kevan Ny Ditty, MD;  Location: [facility] [facility];  Service: Neurosurgery;  Laterality: N/A;  . CRANIOTOMY N/A 12/26/2014   Procedure: CRANIOTOMY TUMOR EXCISION with BrainLab;  Surgeon: Kevan Ny Ditty, MD;  Location: [place] [facility];  Service: Neurosurgery;  Laterality: N/A;  CRANIOTOMY TUMOR EXCISION with Stealth  . HERNIA REPAIR    . VASECTOMY    . VIDEO ASSISTED THORACOSCOPY (VATS)/WEDGE RESECTION Left 01/14/2015   Procedure: VIDEO ASSISTED THORACOSCOPY (VATS)/WEDGE RESECTION, Superior segmentectomy left  lower lobe, wedge resection of left upper lobe, multiple lymph node disection, On Q insertion.;  Surgeon: Grace Isaac, MD;  Location: [facility];  Service: Thoracic;  Laterality: Left;  Marland Kitchen VIDEO BRONCHOSCOPY Bilateral  12/16/2014   Procedure: VIDEO BRONCHOSCOPY WITH FLUORO;  Surgeon: Collene Gobble, MD;  Location: [facility];  Service: Cardiopulmonary;  Laterality: Bilateral;  . VIDEO BRONCHOSCOPY N/A 01/14/2015   Procedure: VIDEO BRONCHOSCOPY;  Surgeon: Grace Isaac, MD;  [facility];  Service: Thoracic;  Laterality: N/A;    Social History:  Social History   Social History  . Marital status: Divorced     Spouse name: N/A  . Number of children: N/A  . Years of education: N/A   Occupational History  . Not on file.   Social History Main Topics  . Smoking status: Former Smoker    Packs/day: 1.00    Years: 36.00    Types: Cigarettes    Start date: 12/14/1979    Quit date: 10/07/2015  . Smokeless tobacco: Current User     Comment: Vapes: Few cigars a day. Quit cigarettes after easter.  . Alcohol use 21.0 oz/week    35 Cans of beer per week     Comment: daily 6 beers a day for the past fefw years.   . Drug use: Yes    Types: Marijuana     Comment: last time 01/04/15  . Sexual activity: Not Currently   Other Topics Concern  . 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 He does have a cat but no other home pets. No mold exposure. Recent travel to [place] but with symptoms at the onset of travel.    Family History: Family History  Problem Relation Age of Onset  . Breast cancer Mother   . Colon polyps Mother   . Arthritis Father      ALLERGIES:  has No Known Allergies.  Meds: Current Outpatient Prescriptions  Medication Sig Dispense Refill  . mirtazapine (REMERON) 30 MG tablet Take 1 tablet (30 mg total) by mouth at bedtime. 90 tablet 0  . sodium chloride 0.9 % SOLN 50 mL with pembrolizumab 100 MG/4ML SOLN 200 mg Inject 200 mg into the vein every 21 ( twenty-one) days. (Patient not taking: Reported on 07/28/2016) 12 Dose 0   No current facility-administered medications for this encounter.     Physical Findings: Wt Readings from Last 3 Encounters:  07/28/16 184 lb 3.2 oz (83.6 kg)  07/28/16 183 lb (83 kg)  07/07/16 185 lb 14.4 oz (84.3 kg)   Temp Readings from Last 3 Encounters:  07/28/16 97.6 F (36.4 C) (Oral)  07/28/16 97.6 F (36.4 C) (Oral)  07/07/16 98.4 F (36.9 C) (Oral)   BP Readings from Last 3 Encounters:  07/28/16 135/90  07/28/16 (!) 152/92  07/28/16 135/90   Pulse Readings  from Last 3 Encounters:  07/28/16 64  07/28/16 64  07/28/16 64  Pain Assessment Pain Score: 0-No pain/10  In general this is a well appearing caucasian male in no acute distress. He's alert and oriented x4 and appropriate throughout the examination. Cardiopulmonary assessment is negative for acute distress and he exhibits normal effort. He is grossly intact neurologically.   Lab Findings: Lab Results  Component Value Date   WBC 7.1 07/28/2016   WBC 8.8 01/16/2015   HGB 15.3 07/28/2016   HCT 45.6 07/28/2016   PLT 246 07/28/2016    Lab Results  Component Value Date   NA 138 07/28/2016   K 4.4 07/28/2016   CHLORIDE 108 07/28/2016   CO2 19 (L) 07/28/2016   GLUCOSE 93 07/28/2016   BUN 17.9 07/28/2016  CREATININE 1.1 07/28/2016   BILITOT 0.33 07/28/2016   ALKPHOS 99 07/28/2016   AST 21 07/28/2016   ALT 19 07/28/2016   PROT 7.0 07/28/2016   ALBUMIN 3.8 07/28/2016   CALCIUM 9.0 07/28/2016   ANIONGAP 11 07/28/2016   ANIONGAP 7 01/16/2015    Radiographic Findings: Ct Chest W Contrast  Result Date: 07/25/2016 CLINICAL DATA:  Lung cancer with brain metastatic disease, restaging. EXAM: CT CHEST, ABDOMEN, AND PELVIS WITH CONTRAST TECHNIQUE: Multidetector CT imaging of the chest, abdomen and pelvis was performed following the standard protocol during bolus administration of intravenous contrast. CONTRAST:  170m ISOVUE-300 IOPAMIDOL (ISOVUE-300) INJECTION 61% COMPARISON:  Multiple exams, including 04/11/2016 FINDINGS: CT CHEST FINDINGS Cardiovascular: Atherosclerotic calcification of the aortic arch and of the left anterior descending coronary artery. Mediastinum/Nodes: No pathologic adenopathy. Lungs/Pleura: New prominent centrilobular emphysema. New mild scarring, right middle lobe. New scarring medially in the left lower lobe. New new new rounded density medially in the left lower lobe along a region of bandlike scarring has a maximum thickness of 1.1 cm on image 97/4, previously 1.3  cm by my measurements. A nodule along the left posterior costophrenic angle measures 0.6 by 0.8 cm on image 146/4, previously 0.6 by 0.9 cm by my measurements. Musculoskeletal: Old left posterolateral healed rib fractures. CT ABDOMEN PELVIS FINDINGS Hepatobiliary: 5 mm nonspecific lesion in segment 6 of the liver, image 66/2, not appreciably changed. Contracted gallbladder. Pancreas: Unremarkable Spleen: Unremarkable Adrenals/Urinary Tract: Several stable hypodense right renal lesions are stable and statistically likely to be benign but technically nonspecific. Stomach/Bowel: Sigmoid colon diverticulosis. Vascular/Lymphatic: Aortoiliac atherosclerotic vascular disease. No pathologic adenopathy. Reproductive: Unremarkable Other: No supplemental non-categorized findings. Musculoskeletal: Lumbar spondylosis and degenerative disc disease at the L5-S1 level. Small umbilical hernia contains adipose tissue. IMPRESSION: 1. Bandlike density in the left lower lobe with slight nodularity currently measuring 1.1 cm in thickness, previously 1.3 cm. Stable small nodule along the costophrenic angle. No new or enlarging pulmonary nodules. 2. Several stable small hepatic and right renal hypodense lesions are technically nonspecific but statistically likely to be small cysts. 3. Aortic Atherosclerosis (ICD10-I70.0) and Emphysema (ICD10-J43.9). Coronary atherosclerosis. 4. Sigmoid colon diverticulosis. 5. Lumbar spondylosis and degenerative disc disease at L5-S1. Electronically Signed   By: WVan ClinesM.D.   On: 07/25/2016 10:37   Mr BJeri CosWCXContrast  Result Date: 07/21/2016 CLINICAL DATA:  Metastatic lung cancer. Craniotomy with radiotherapy EXAM: MRI HEAD WITHOUT AND WITH CONTRAST TECHNIQUE: Multiplanar, multiecho pulse sequences of the brain and surrounding structures were obtained without and with intravenous contrast. CONTRAST:  13mMULTIHANCE GADOBENATE DIMEGLUMINE 529 MG/ML IV SOLN COMPARISON:  MRI 04/21/2016,  11/09/2015 FINDINGS: Brain: Left frontal parietal lesion shows progressive enhancement since prior studies. There is a rim of enhancement which is irregular. The enhancing lesion now measures 11 x 14 x 14 mm. Small amount of methemoglobin is also present within the lesion. Surrounding white matter hyperintensity slightly increased from the prior study. No new lesions identified. Ventricle size normal. No acute infarct. Vascular: Normal arterial flow void. Skull and upper cervical spine: Negative for metastatic disease. The left parietal craniotomy. Sinuses/Orbits: Mucous retention cyst right maxillary sinus. Normal orbit. Other: None IMPRESSION: Progressive enhancement of the lesion in the left frontal parietal lobe. Slight increase in surrounding edema. Findings could be due to treatment effect versus recurrent tumor. No new lesions. Electronically Signed   By: ChFranchot Gallo.D.   On: 07/21/2016 09:34   Ct Abdomen Pelvis W Contrast  Result Date: 07/25/2016  CLINICAL DATA:  Lung cancer with brain metastatic disease, restaging. EXAM: CT CHEST, ABDOMEN, AND PELVIS WITH CONTRAST TECHNIQUE: Multidetector CT imaging of the chest, abdomen and pelvis was performed following the standard protocol during bolus administration of intravenous contrast. CONTRAST:  145m ISOVUE-300 IOPAMIDOL (ISOVUE-300) INJECTION 61% COMPARISON:  Multiple exams, including 04/11/2016 FINDINGS: CT CHEST FINDINGS Cardiovascular: Atherosclerotic calcification of the aortic arch and of the left anterior descending coronary artery. Mediastinum/Nodes: No pathologic adenopathy. Lungs/Pleura: New prominent centrilobular emphysema. New mild scarring, right middle lobe. New scarring medially in the left lower lobe. New new new rounded density medially in the left lower lobe along a region of bandlike scarring has a maximum thickness of 1.1 cm on image 97/4, previously 1.3 cm by my measurements. A nodule along the left posterior costophrenic angle  measures 0.6 by 0.8 cm on image 146/4, previously 0.6 by 0.9 cm by my measurements. Musculoskeletal: Old left posterolateral healed rib fractures. CT ABDOMEN PELVIS FINDINGS Hepatobiliary: 5 mm nonspecific lesion in segment 6 of the liver, image 66/2, not appreciably changed. Contracted gallbladder. Pancreas: Unremarkable Spleen: Unremarkable Adrenals/Urinary Tract: Several stable hypodense right renal lesions are stable and statistically likely to be benign but technically nonspecific. Stomach/Bowel: Sigmoid colon diverticulosis. Vascular/Lymphatic: Aortoiliac atherosclerotic vascular disease. No pathologic adenopathy. Reproductive: Unremarkable Other: No supplemental non-categorized findings. Musculoskeletal: Lumbar spondylosis and degenerative disc disease at the L5-S1 level. Small umbilical hernia contains adipose tissue. IMPRESSION: 1. Bandlike density in the left lower lobe with slight nodularity currently measuring 1.1 cm in thickness, previously 1.3 cm. Stable small nodule along the costophrenic angle. No new or enlarging pulmonary nodules. 2. Several stable small hepatic and right renal hypodense lesions are technically nonspecific but statistically likely to be small cysts. 3. Aortic Atherosclerosis (ICD10-I70.0) and Emphysema (ICD10-J43.9). Coronary atherosclerosis. 4. Sigmoid colon diverticulosis. 5. Lumbar spondylosis and degenerative disc disease at L5-S1. Electronically Signed   By: WVan ClinesM.D.   On: 07/25/2016 10:37    Impression/Plan:  1. 56y.o. gentleman withStage IV, T2a, N1, M1b NSCLC, adenocarcinoma of the left lower lobe with brain metastases.  The patient's imaging overall was suggestive of post radiotherapy effect with progressive enhancement of the treated lesion. Dr. MTammi Klippelwould recommend a trial of Trental 4034mpo BID and Vitamin E 400 IU BID with plans to repeat his imaging in 2 months' time for further evaluation. He is in agreement and will contact usKoreaith questions  or concerns. 2. Anxiety/depression.  Patient is currently taking Remeron for depression but is interested in changing medications due to the side effect of weight gain.  I have advised that [facility] Dr. StOrpah Melter He is in agreement and will call to arrange an appointment in the near future.     AsNicholos JohnsPA-C

## 2016-07-28 NOTE — Addendum Note (Signed)
Encounter addended by: Malena Edman, RN on: 07/28/2016  3:03 PM<BR>    Actions taken: Charge Capture section accepted

## 2016-07-28 NOTE — Patient Instructions (Signed)
[facility] Discharge Instructions for Patients Receiving Chemotherapy  Today you received the following chemotherapy agents: Keytruda   To help prevent nausea and vomiting after your treatment, we encourage you to take your nausea medication as directed    If you develop nausea and vomiting that is not controlled by your nausea medication, call the clinic.   BELOW ARE SYMPTOMS THAT SHOULD BE REPORTED IMMEDIATELY:  *FEVER GREATER THAN 100.5 F  *CHILLS WITH OR WITHOUT FEVER  NAUSEA AND VOMITING THAT IS NOT CONTROLLED WITH YOUR NAUSEA MEDICATION  *UNUSUAL SHORTNESS OF BREATH  *UNUSUAL BRUISING OR BLEEDING  TENDERNESS IN MOUTH AND THROAT WITH OR WITHOUT PRESENCE OF ULCERS  *URINARY PROBLEMS  *BOWEL PROBLEMS  UNUSUAL RASH Items with * indicate a potential emergency and should be followed up as soon as possible.  Feel free to call the clinic you have any questions or concerns. The clinic phone number is [phone].  Please show the CHEMO ALERT CARD at check-in to the Emergency Department and triage nurse.   

## 2016-08-18 ENCOUNTER — Ambulatory Visit (HOSPITAL_BASED_OUTPATIENT_CLINIC_OR_DEPARTMENT_OTHER): Payer: BLUE CROSS/BLUE SHIELD | Admitting: Internal Medicine

## 2016-08-18 ENCOUNTER — Telehealth: Payer: Self-pay | Admitting: Internal Medicine

## 2016-08-18 ENCOUNTER — Encounter: Payer: Self-pay | Admitting: Internal Medicine

## 2016-08-18 ENCOUNTER — Other Ambulatory Visit (HOSPITAL_BASED_OUTPATIENT_CLINIC_OR_DEPARTMENT_OTHER): Payer: BLUE CROSS/BLUE SHIELD

## 2016-08-18 ENCOUNTER — Ambulatory Visit (HOSPITAL_BASED_OUTPATIENT_CLINIC_OR_DEPARTMENT_OTHER): Payer: BLUE CROSS/BLUE SHIELD

## 2016-08-18 VITALS — BP 131/89 | HR 65 | Temp 97.6°F | Resp 18 | Ht <= 58 in | Wt 183.6 lb

## 2016-08-18 DIAGNOSIS — C7931 Secondary malignant neoplasm of brain: Secondary | ICD-10-CM

## 2016-08-18 DIAGNOSIS — Z5112 Encounter for antineoplastic immunotherapy: Secondary | ICD-10-CM

## 2016-08-18 DIAGNOSIS — C771 Secondary and unspecified malignant neoplasm of intrathoracic lymph nodes: Secondary | ICD-10-CM | POA: Diagnosis not present

## 2016-08-18 DIAGNOSIS — C3432 Malignant neoplasm of lower lobe, left bronchus or lung: Secondary | ICD-10-CM

## 2016-08-18 LAB — COMPREHENSIVE METABOLIC PANEL
ALBUMIN: 3.7 g/dL (ref 3.5–5.0)
ALK PHOS: 99 U/L (ref 40–150)
ALT: 19 U/L (ref 0–55)
AST: 23 U/L (ref 5–34)
Anion Gap: 9 mEq/L (ref 3–11)
BUN: 17.4 mg/dL (ref 7.0–26.0)
CO2: 19 mEq/L — ABNORMAL LOW (ref 22–29)
Calcium: 9.1 mg/dL (ref 8.4–10.4)
Chloride: 111 mEq/L — ABNORMAL HIGH (ref 98–109)
Creatinine: 1.1 mg/dL (ref 0.7–1.3)
EGFR: 78 mL/min/{1.73_m2} — ABNORMAL LOW (ref >90)
GLUCOSE: 90 mg/dL (ref 70–140)
POTASSIUM: 4.3 meq/L (ref 3.5–5.1)
SODIUM: 139 meq/L (ref 136–145)
Total Bilirubin: 0.36 mg/dL (ref 0.20–1.20)
Total Protein: 7 g/dL (ref 6.4–8.3)

## 2016-08-18 LAB — CBC WITH DIFFERENTIAL/PLATELET
BASO%: 0.8 % (ref 0.0–2.0)
Basophils Absolute: 0.1 10*3/uL (ref 0.0–0.1)
EOS ABS: 0.2 10*3/uL (ref 0.0–0.5)
EOS%: 2.9 % (ref 0.0–7.0)
HCT: 45.9 % (ref 38.4–49.9)
HEMOGLOBIN: 15.7 g/dL (ref 13.0–17.1)
LYMPH%: 26.1 % (ref 14.0–49.0)
MCH: 32.8 pg (ref 27.2–33.4)
MCHC: 34.2 g/dL (ref 32.0–36.0)
MCV: 96 fL (ref 79.3–98.0)
MONO#: 0.7 10*3/uL (ref 0.1–0.9)
MONO%: 10.9 % (ref 0.0–14.0)
NEUT#: 3.7 10*3/uL (ref 1.5–6.5)
NEUT%: 59.3 % (ref 39.0–75.0)
Platelets: 246 10*3/uL (ref 140–400)
RBC: 4.78 10*6/uL (ref 4.20–5.82)
RDW: 13 % (ref 11.0–14.6)
WBC: 6.3 10*3/uL (ref 4.0–10.3)
lymph#: 1.7 10*3/uL (ref 0.9–3.3)

## 2016-08-18 MED ORDER — SODIUM CHLORIDE 0.9 % IV SOLN
200.0000 mg | Freq: Once | INTRAVENOUS | Status: AC
  Administered 2016-08-18: 200 mg via INTRAVENOUS
  Filled 2016-08-18: qty 8

## 2016-08-18 MED ORDER — SODIUM CHLORIDE 0.9 % IV SOLN
Freq: Once | INTRAVENOUS | Status: AC
  Administered 2016-08-18: 11:00:00 via INTRAVENOUS

## 2016-08-18 NOTE — Telephone Encounter (Signed)
Scheduled appt per 8/9 los - Gave patient AVS and calender per los.  

## 2016-08-18 NOTE — Progress Notes (Signed)
[facility] [phone]   Fax:(336) [phone]  OFFICE PROGRESS NOTE  Briscoe Deutscher, MD [address] [place] [address]  DIAGNOSIS:  Stage IV (T2a, N1, M1b) non-small cell lung cancer, adenocarcinoma, negative EGFR mutation but equivocal EGFR amplification, negative ALK gene translocation and negative ROS 1 but with PDL-1 expression 100% presented with left lower lobe lung mass in addition to left hilar adenopathy and solitary metastatic brain lesion diagnosed in December 2016.  PRIOR THERAPY: 1) status post stereotactic radiotherapy and surgical resection diagnosed in December 2016. 2) status post video bronchoscopy with left VATS and wedge resection of the left upper lobe and left lower lobe superior segmentectomy with lymph node dissection under the care of Dr. Servando Snare on 01/14/2015.  CURRENT THERAPY: First-line treatment with immunotherapy with Ketruda (pembrolizumab) 200 mg IV every 3 weeks status post 27 cycles. First cycle was given 02/18/2015.  INTERVAL HISTORY: Timothy Obrien 56 y.o. male returns to the clinic today for follow-up visit. The patient is feeling fine today with no specific complaints. He continues to tolerate his treatment with [place] (pembrolizumab) fairly well. He denied having any skin rash or diarrhea. He denied having any chest pain, shortness of breath, cough or hemoptysis. He has no fever or chills. He has no nausea, vomiting, diarrhea or constipation. He denied having any significant weight loss or night sweats.  MEDICAL HISTORY: Past Medical History:  Diagnosis Date  . Bronchitis   . Chronic fatigue 09/17/2015  . Depression 02/11/2016  . Hypertension 12/31/2015  . Lung cancer ([facility])    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  . Pneumonia   . Seizures ([facility])    last 12/11/14  . Shortness of breath dyspnea    with exertion  . Tobacco abuse     ALLERGIES:  has No Known Allergies.  MEDICATIONS:  Current  Outpatient Prescriptions  Medication Sig Dispense Refill  . ALPRAZolam (XANAX) 0.25 MG tablet Take 1 tablet by mouth daily as needed.    . mirtazapine (REMERON) 30 MG tablet Take 1 tablet (30 mg total) by mouth at bedtime. 90 tablet 0  . pentoxifylline (TRENTAL) 400 MG CR tablet Take 1 tablet (400 mg total) by mouth 2 (two) times daily after a meal. 60 tablet 5  . sodium chloride 0.9 % SOLN 50 mL with pembrolizumab 100 MG/4ML SOLN 200 mg Inject 200 mg into the vein every 21 ( twenty-one) days. 12 Dose 0  . vitamin E 400 UNIT capsule Take 400 Units by mouth daily.     No current facility-administered medications for this visit.     SURGICAL HISTORY:  Past Surgical History:  Procedure Laterality Date  . APPLICATION OF CRANIAL NAVIGATION N/A 12/26/2014   Procedure: APPLICATION OF CRANIAL NAVIGATION;  Surgeon: Kevan Ny Ditty, MD;  Location: [facility];  Service: Neurosurgery;  Laterality: N/A;  . CRANIOTOMY N/A 12/26/2014   Procedure: CRANIOTOMY TUMOR EXCISION with BrainLab;  Surgeon: Kevan Ny Ditty, MD;  Location: [facility];  Service: Neurosurgery;  Laterality: N/A;  CRANIOTOMY TUMOR EXCISION with Stealth  . HERNIA REPAIR    . VASECTOMY    . VIDEO ASSISTED THORACOSCOPY (VATS)/WEDGE RESECTION Left 01/14/2015   Procedure: VIDEO ASSISTED THORACOSCOPY (VATS)/WEDGE RESECTION, Superior segmentectomy left  lower lobe, wedge resection of left upper lobe, multiple lymph node disection, On Q insertion.;  Surgeon: Grace Isaac, MD;  Location: [facility];  Service: Thoracic;  Laterality: Left;  Marland Kitchen VIDEO BRONCHOSCOPY  Bilateral 12/16/2014   Procedure: VIDEO BRONCHOSCOPY WITH FLUORO;  Surgeon: Collene Gobble, MD;  Location: [facility];  Service: Cardiopulmonary;  Laterality: Bilateral;  . VIDEO BRONCHOSCOPY N/A 01/14/2015   Procedure: VIDEO BRONCHOSCOPY;  Surgeon: Grace Isaac, MD;  Location[facility];  Service: Thoracic;  Laterality: N/A;    REVIEW OF SYSTEMS:  A comprehensive review of  systems was negative.   PHYSICAL EXAMINATION: General appearance: alert, cooperative and no distress Head: Normocephalic, without obvious abnormality, atraumatic Neck: no adenopathy, no JVD, supple, symmetrical, trachea midline and thyroid not enlarged, symmetric, no tenderness/mass/nodules Lymph nodes: Cervical, supraclavicular, and axillary nodes normal. Resp: clear to auscultation bilaterally Back: symmetric, no curvature. ROM normal. No CVA tenderness. Cardio: regular rate and rhythm, S1, S2 normal, no murmur, click, rub or gallop GI: soft, non-tender; bowel sounds normal; no masses,  no organomegaly Extremities: extremities normal, atraumatic, no cyanosis or edema  ECOG PERFORMANCE STATUS: 0 - Asymptomatic  Blood pressure 131/89, pulse 65, temperature 97.6 F (36.4 C), temperature source Oral, resp. rate 18, height (!) 6" (0.152 m), weight 183 lb 9.6 oz (83.3 kg), SpO2 97 %.  LABORATORY DATA: Lab Results  Component Value Date   WBC 6.3 08/18/2016   HGB 15.7 08/18/2016   HCT 45.9 08/18/2016   MCV 96.0 08/18/2016   PLT 246 08/18/2016      Chemistry      Component Value Date/Time   NA 139 08/18/2016 0924   K 4.3 08/18/2016 0924   CL 109 01/16/2015 0450   CO2 19 (L) 08/18/2016 0924   BUN 17.4 08/18/2016 0924   CREATININE 1.1 08/18/2016 0924      Component Value Date/Time   CALCIUM 9.1 08/18/2016 0924   ALKPHOS 99 08/18/2016 0924   AST 23 08/18/2016 0924   ALT 19 08/18/2016 0924   BILITOT 0.36 08/18/2016 0924       RADIOGRAPHIC STUDIES: Ct Chest W Contrast  Result Date: 07/25/2016 CLINICAL DATA:  Lung cancer with brain metastatic disease, restaging. EXAM: CT CHEST, ABDOMEN, AND PELVIS WITH CONTRAST TECHNIQUE: Multidetector CT imaging of the chest, abdomen and pelvis was performed following the standard protocol during bolus administration of intravenous contrast. CONTRAST:  158m ISOVUE-300 IOPAMIDOL (ISOVUE-300) INJECTION 61% COMPARISON:  Multiple exams, including  04/11/2016 FINDINGS: CT CHEST FINDINGS Cardiovascular: Atherosclerotic calcification of the aortic arch and of the left anterior descending coronary artery. Mediastinum/Nodes: No pathologic adenopathy. Lungs/Pleura: New prominent centrilobular emphysema. New mild scarring, right middle lobe. New scarring medially in the left lower lobe. New new new rounded density medially in the left lower lobe along a region of bandlike scarring has a maximum thickness of 1.1 cm on image 97/4, previously 1.3 cm by my measurements. A nodule along the left posterior costophrenic angle measures 0.6 by 0.8 cm on image 146/4, previously 0.6 by 0.9 cm by my measurements. Musculoskeletal: Old left posterolateral healed rib fractures. CT ABDOMEN PELVIS FINDINGS Hepatobiliary: 5 mm nonspecific lesion in segment 6 of the liver, image 66/2, not appreciably changed. Contracted gallbladder. Pancreas: Unremarkable Spleen: Unremarkable Adrenals/Urinary Tract: Several stable hypodense right renal lesions are stable and statistically likely to be benign but technically nonspecific. Stomach/Bowel: Sigmoid colon diverticulosis. Vascular/Lymphatic: Aortoiliac atherosclerotic vascular disease. No pathologic adenopathy. Reproductive: Unremarkable Other: No supplemental non-categorized findings. Musculoskeletal: Lumbar spondylosis and degenerative disc disease at the L5-S1 level. Small umbilical hernia contains adipose tissue. IMPRESSION: 1. Bandlike density in the left lower lobe with slight nodularity currently measuring 1.1 cm in thickness, previously 1.3 cm. Stable small nodule along  the costophrenic angle. No new or enlarging pulmonary nodules. 2. Several stable small hepatic and right renal hypodense lesions are technically nonspecific but statistically likely to be small cysts. 3. Aortic Atherosclerosis (ICD10-I70.0) and Emphysema (ICD10-J43.9). Coronary atherosclerosis. 4. Sigmoid colon diverticulosis. 5. Lumbar spondylosis and degenerative disc  disease at L5-S1. Electronically Signed   By: Van Clines M.D.   On: 07/25/2016 10:37   Mr [name] NW Contrast  Result Date: 07/21/2016 CLINICAL DATA:  Metastatic lung cancer. Craniotomy with radiotherapy EXAM: MRI HEAD WITHOUT AND WITH CONTRAST TECHNIQUE: Multiplanar, multiecho pulse sequences of the brain and surrounding structures were obtained without and with intravenous contrast. CONTRAST:  18m MULTIHANCE GADOBENATE DIMEGLUMINE 529 MG/ML IV SOLN COMPARISON:  MRI 04/21/2016, 11/09/2015 FINDINGS: Brain: Left frontal parietal lesion shows progressive enhancement since prior studies. There is a rim of enhancement which is irregular. The enhancing lesion now measures 11 x 14 x 14 mm. Small amount of methemoglobin is also present within the lesion. Surrounding white matter hyperintensity slightly increased from the prior study. No new lesions identified. Ventricle size normal. No acute infarct. Vascular: Normal arterial flow void. Skull and upper cervical spine: Negative for metastatic disease. The left parietal craniotomy. Sinuses/Orbits: Mucous retention cyst right maxillary sinus. Normal orbit. Other: None IMPRESSION: Progressive enhancement of the lesion in the left frontal parietal lobe. Slight increase in surrounding edema. Findings could be due to treatment effect versus recurrent tumor. No new lesions. Electronically Signed   By: CFranchot GalloM.D.   On: 07/21/2016 09:34   Ct Abdomen Pelvis W Contrast  Result Date: 07/25/2016 CLINICAL DATA:  Lung cancer with brain metastatic disease, restaging. EXAM: CT CHEST, ABDOMEN, AND PELVIS WITH CONTRAST TECHNIQUE: Multidetector CT imaging of the chest, abdomen and pelvis was performed following the standard protocol during bolus administration of intravenous contrast. CONTRAST:  1041mISOVUE-300 IOPAMIDOL (ISOVUE-300) INJECTION 61% COMPARISON:  Multiple exams, including 04/11/2016 FINDINGS: CT CHEST FINDINGS Cardiovascular: Atherosclerotic  calcification of the aortic arch and of the left anterior descending coronary artery. Mediastinum/Nodes: No pathologic adenopathy. Lungs/Pleura: New prominent centrilobular emphysema. New mild scarring, right middle lobe. New scarring medially in the left lower lobe. New new new rounded density medially in the left lower lobe along a region of bandlike scarring has a maximum thickness of 1.1 cm on image 97/4, previously 1.3 cm by my measurements. A nodule along the left posterior costophrenic angle measures 0.6 by 0.8 cm on image 146/4, previously 0.6 by 0.9 cm by my measurements. Musculoskeletal: Old left posterolateral healed rib fractures. CT ABDOMEN PELVIS FINDINGS Hepatobiliary: 5 mm nonspecific lesion in segment 6 of the liver, image 66/2, not appreciably changed. Contracted gallbladder. Pancreas: Unremarkable Spleen: Unremarkable Adrenals/Urinary Tract: Several stable hypodense right renal lesions are stable and statistically likely to be benign but technically nonspecific. Stomach/Bowel: Sigmoid colon diverticulosis. Vascular/Lymphatic: Aortoiliac atherosclerotic vascular disease. No pathologic adenopathy. Reproductive: Unremarkable Other: No supplemental non-categorized findings. Musculoskeletal: Lumbar spondylosis and degenerative disc disease at the L5-S1 level. Small umbilical hernia contains adipose tissue. IMPRESSION: 1. Bandlike density in the left lower lobe with slight nodularity currently measuring 1.1 cm in thickness, previously 1.3 cm. Stable small nodule along the costophrenic angle. No new or enlarging pulmonary nodules. 2. Several stable small hepatic and right renal hypodense lesions are technically nonspecific but statistically likely to be small cysts. 3. Aortic Atherosclerosis (ICD10-I70.0) and Emphysema (ICD10-J43.9). Coronary atherosclerosis. 4. Sigmoid colon diverticulosis. 5. Lumbar spondylosis and degenerative disc disease at L5-S1. Electronically Signed   By: WaCindra Eves.  On: 07/25/2016 10:37    ASSESSMENT AND PLAN:  This is a very pleasant 56 years old white male with metastatic non-small cell lung cancer, adenocarcinoma with positive PDL 1 expression of 100%. He is currently undergoing treatment with Ketruda (pembrolizumab) 200 mg IV every 3 weeks, status post 27 cycles. The patient has been tolerating the treatment well with no significant adverse effects. I recommended for the patient to proceed with cycle #28 today as a scheduled. I will see him back for follow-up visit in 3 weeks for evaluation before starting the next dose of his treatment. He was advised to call immediately if he has any concerning symptoms in the interval. The patient voices understanding of current disease status and treatment options and is in agreement with the current care plan. All questions were answered. The patient knows to call the clinic with any problems, questions or concerns. We can certainly see the patient much sooner if necessary.  Disclaimer: This note was dictated with voice recognition software. Similar sounding words can inadvertently be transcribed and may not be corrected upon review.

## 2016-08-18 NOTE — Patient Instructions (Signed)
[facility] Discharge Instructions for Patients Receiving Chemotherapy  Today you received the following chemotherapy agents Keytruda  To help prevent nausea and vomiting after your treatment, we encourage you to take your nausea medication    If you develop nausea and vomiting that is not controlled by your nausea medication, call the clinic.   BELOW ARE SYMPTOMS THAT SHOULD BE REPORTED IMMEDIATELY:  *FEVER GREATER THAN 100.5 F  *CHILLS WITH OR WITHOUT FEVER  NAUSEA AND VOMITING THAT IS NOT CONTROLLED WITH YOUR NAUSEA MEDICATION  *UNUSUAL SHORTNESS OF BREATH  *UNUSUAL BRUISING OR BLEEDING  TENDERNESS IN MOUTH AND THROAT WITH OR WITHOUT PRESENCE OF ULCERS  *URINARY PROBLEMS  *BOWEL PROBLEMS  UNUSUAL RASH Items with * indicate a potential emergency and should be followed up as soon as possible.  Feel free to call the clinic you have any questions or concerns. The clinic phone number is [phone].  Please show the [facility] at check-in to the Emergency Department and triage nurse.

## 2016-08-25 ENCOUNTER — Other Ambulatory Visit: Payer: Self-pay | Admitting: Radiation Therapy

## 2016-08-25 DIAGNOSIS — C7931 Secondary malignant neoplasm of brain: Secondary | ICD-10-CM

## 2016-09-08 ENCOUNTER — Other Ambulatory Visit (HOSPITAL_BASED_OUTPATIENT_CLINIC_OR_DEPARTMENT_OTHER): Payer: BLUE CROSS/BLUE SHIELD

## 2016-09-08 ENCOUNTER — Encounter: Payer: Self-pay | Admitting: Internal Medicine

## 2016-09-08 ENCOUNTER — Ambulatory Visit (HOSPITAL_BASED_OUTPATIENT_CLINIC_OR_DEPARTMENT_OTHER): Payer: BLUE CROSS/BLUE SHIELD | Admitting: Internal Medicine

## 2016-09-08 ENCOUNTER — Telehealth: Payer: Self-pay | Admitting: Internal Medicine

## 2016-09-08 ENCOUNTER — Ambulatory Visit (HOSPITAL_BASED_OUTPATIENT_CLINIC_OR_DEPARTMENT_OTHER): Payer: BLUE CROSS/BLUE SHIELD

## 2016-09-08 VITALS — BP 127/84 | HR 78 | Temp 98.1°F | Resp 18 | Wt 184.8 lb

## 2016-09-08 DIAGNOSIS — C3432 Malignant neoplasm of lower lobe, left bronchus or lung: Secondary | ICD-10-CM

## 2016-09-08 DIAGNOSIS — C7931 Secondary malignant neoplasm of brain: Secondary | ICD-10-CM

## 2016-09-08 DIAGNOSIS — C771 Secondary and unspecified malignant neoplasm of intrathoracic lymph nodes: Secondary | ICD-10-CM

## 2016-09-08 DIAGNOSIS — Z5112 Encounter for antineoplastic immunotherapy: Secondary | ICD-10-CM

## 2016-09-08 LAB — CBC WITH DIFFERENTIAL/PLATELET
BASO%: 0.6 % (ref 0.0–2.0)
Basophils Absolute: 0 10*3/uL (ref 0.0–0.1)
EOS ABS: 0.3 10*3/uL (ref 0.0–0.5)
EOS%: 3.9 % (ref 0.0–7.0)
HEMATOCRIT: 46.3 % (ref 38.4–49.9)
HGB: 15.6 g/dL (ref 13.0–17.1)
LYMPH%: 23.8 % (ref 14.0–49.0)
MCH: 32.2 pg (ref 27.2–33.4)
MCHC: 33.7 g/dL (ref 32.0–36.0)
MCV: 95.7 fL (ref 79.3–98.0)
MONO#: 0.8 10*3/uL (ref 0.1–0.9)
MONO%: 11.4 % (ref 0.0–14.0)
NEUT%: 60.3 % (ref 39.0–75.0)
NEUTROS ABS: 4.2 10*3/uL (ref 1.5–6.5)
PLATELETS: 239 10*3/uL (ref 140–400)
RBC: 4.84 10*6/uL (ref 4.20–5.82)
RDW: 13 % (ref 11.0–14.6)
WBC: 6.9 10*3/uL (ref 4.0–10.3)
lymph#: 1.7 10*3/uL (ref 0.9–3.3)

## 2016-09-08 LAB — COMPREHENSIVE METABOLIC PANEL
ALBUMIN: 3.7 g/dL (ref 3.5–5.0)
ALK PHOS: 100 U/L (ref 40–150)
ALT: 22 U/L (ref 0–55)
ANION GAP: 9 meq/L (ref 3–11)
AST: 26 U/L (ref 5–34)
BILIRUBIN TOTAL: 0.4 mg/dL (ref 0.20–1.20)
BUN: 15.2 mg/dL (ref 7.0–26.0)
CALCIUM: 9.1 mg/dL (ref 8.4–10.4)
CO2: 19 mEq/L — ABNORMAL LOW (ref 22–29)
Chloride: 111 mEq/L — ABNORMAL HIGH (ref 98–109)
Creatinine: 1 mg/dL (ref 0.7–1.3)
EGFR: 86 mL/min/{1.73_m2} — AB (ref >90)
Glucose: 91 mg/dl (ref 70–140)
Potassium: 4.1 mEq/L (ref 3.5–5.1)
Sodium: 139 mEq/L (ref 136–145)
TOTAL PROTEIN: 7 g/dL (ref 6.4–8.3)

## 2016-09-08 MED ORDER — PEMBROLIZUMAB CHEMO INJECTION 100 MG/4ML
200.0000 mg | Freq: Once | INTRAVENOUS | Status: AC
  Administered 2016-09-08: 200 mg via INTRAVENOUS
  Filled 2016-09-08: qty 8

## 2016-09-08 MED ORDER — SODIUM CHLORIDE 0.9 % IV SOLN
Freq: Once | INTRAVENOUS | Status: AC
  Administered 2016-09-08: 11:00:00 via INTRAVENOUS

## 2016-09-08 NOTE — Patient Instructions (Signed)
[facility] Discharge Instructions for Patients Receiving Chemotherapy  Today you received the following chemotherapy agents Keytruda  To help prevent nausea and vomiting after your treatment, we encourage you to take your nausea medication as dirercted   If you develop nausea and vomiting that is not controlled by your nausea medication, call the clinic.   BELOW ARE SYMPTOMS THAT SHOULD BE REPORTED IMMEDIATELY:  *FEVER GREATER THAN 100.5 F  *CHILLS WITH OR WITHOUT FEVER  NAUSEA AND VOMITING THAT IS NOT CONTROLLED WITH YOUR NAUSEA MEDICATION  *UNUSUAL SHORTNESS OF BREATH  *UNUSUAL BRUISING OR BLEEDING  TENDERNESS IN MOUTH AND THROAT WITH OR WITHOUT PRESENCE OF ULCERS  *URINARY PROBLEMS  *BOWEL PROBLEMS  UNUSUAL RASH Items with * indicate a potential emergency and should be followed up as soon as possible.  Feel free to call the clinic you have any questions or concerns. The clinic phone number is [phone].  Please show the [facility] at check-in to the Emergency Department and triage nurse.

## 2016-09-08 NOTE — Progress Notes (Signed)
[facility] [phone]   Fax:([phone]  OFFICE PROGRESS NOTE  Briscoe Deutscher, MD [address] [place] [address]  DIAGNOSIS:  Stage IV (T2a, N1, M1b) non-small cell lung cancer, adenocarcinoma, negative EGFR mutation but equivocal EGFR amplification, negative ALK gene translocation and negative ROS 1 but with PDL-1 expression 100% presented with left lower lobe lung mass in addition to left hilar adenopathy and solitary metastatic brain lesion diagnosed in December 2016.  PRIOR THERAPY: 1) status post stereotactic radiotherapy and surgical resection diagnosed in December 2016. 2) status post video bronchoscopy with left VATS and wedge resection of the left upper lobe and left lower lobe superior segmentectomy with lymph node dissection under the care of Dr. Servando Snare on 01/14/2015.  CURRENT THERAPY: First-line treatment with immunotherapy with Ketruda (pembrolizumab) 200 mg IV every 3 weeks status post 28 cycles. First cycle was given 02/18/2015.  INTERVAL HISTORY: Timothy Obrien 56 y.o. male returns to the clinic today for follow-up visit. The patient is feeling fine today with no specific complaints. He continues to tolerate his treatment with [place] (pembrolizumab) fairly well. He denied having any chest pain, shortness of breath, cough or hemoptysis. He denied having any fever or chills. The patient has no nausea, vomiting, diarrhea or constipation. He has no significant skin rash. He is here today for evaluation before starting cycle #29.  MEDICAL HISTORY: Past Medical History:  Diagnosis Date  . Bronchitis   . Chronic fatigue 09/17/2015  . Depression 02/11/2016  . Hypertension 12/31/2015  . Lung cancer ([facility])    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  . Pneumonia   . Seizures ([facility])    last 12/11/14  . Shortness of breath dyspnea    with exertion  . Tobacco abuse     ALLERGIES:  has No Known Allergies.  MEDICATIONS:  Current  Outpatient Prescriptions  Medication Sig Dispense Refill  . ALPRAZolam (XANAX) 0.25 MG tablet Take 1 tablet by mouth daily as needed.    . mirtazapine (REMERON) 30 MG tablet Take 1 tablet (30 mg total) by mouth at bedtime. 90 tablet 0  . pentoxifylline (TRENTAL) 400 MG CR tablet Take 1 tablet (400 mg total) by mouth 2 (two) times daily after a meal. 60 tablet 5  . sodium chloride 0.9 % SOLN 50 mL with pembrolizumab 100 MG/4ML SOLN 200 mg Inject 200 mg into the vein every 21 ( twenty-one) days. 12 Dose 0  . vitamin E 400 UNIT capsule Take 400 Units by mouth daily.     No current facility-administered medications for this visit.     SURGICAL HISTORY:  Past Surgical History:  Procedure Laterality Date  . APPLICATION OF CRANIAL NAVIGATION N/A 12/26/2014   Procedure: APPLICATION OF CRANIAL NAVIGATION;  Surgeon: Kevan Ny Ditty, MD;  Location: [facility]UR[facility];  Service: Neurosurgery;  Laterality: N/A;  . CRANIOTOMY N/A 12/26/2014   Procedure: CRANIOTOMY TUMOR EXCISION with BrainLab;  Surgeon: Kevan Ny Ditty, MD;  Location: [facility] NEURO ORS;  Service: Neurosurgery;  Laterality: N/A;  CRANIOTOMY TUMOR EXCISION with Stealth  . HERNIA REPAIR    . VASECTOMY    . VIDEO ASSISTED THORACOSCOPY (VATS)/WEDGE RESECTION Left 01/14/2015   Procedure: VIDEO ASSISTED THORACOSCOPY (VATS)/WEDGE RESECTION, Superior segmentectomy left  lower lobe, wedge resection of left upper lobe, multiple lymph node disection, On Q insertion.;  Surgeon: Grace Isaac, MD;  Location: [facility];  Service: Thoracic;  Laterality: Left;  Marland Kitchen VIDEO BRONCHOSCOPY  Bilateral 12/16/2014   Procedure: VIDEO BRONCHOSCOPY WITH FLUORO;  Surgeon: Collene Gobble, MD;  Location: [facility];  Service: Cardiopulmonary;  Laterality: Bilateral;  . VIDEO BRONCHOSCOPY N/A 01/14/2015   Procedure: VIDEO BRONCHOSCOPY;  Surgeon: Grace Isaac, MD;  Location: [facility] OR;  Service: Thoracic;  Laterality: N/A;    REVIEW OF SYSTEMS:  A comprehensive review of  systems was negative.   PHYSICAL EXAMINATION: General appearance: alert, cooperative and no distress Head: Normocephalic, without obvious abnormality, atraumatic Neck: no adenopathy, no JVD, supple, symmetrical, trachea midline and thyroid not enlarged, symmetric, no tenderness/mass/nodules Lymph nodes: Cervical, supraclavicular, and axillary nodes normal. Resp: clear to auscultation bilaterally Back: symmetric, no curvature. ROM normal. No CVA tenderness. Cardio: regular rate and rhythm, S1, S2 normal, no murmur, click, rub or gallop GI: soft, non-tender; bowel sounds normal; no masses,  no organomegaly Extremities: extremities normal, atraumatic, no cyanosis or edema  ECOG PERFORMANCE STATUS: 0 - Asymptomatic  Blood pressure 127/84, pulse 78, temperature 98.1 F (36.7 C), temperature source Oral, resp. rate 18, weight 184 lb 12.8 oz (83.8 kg), SpO2 96 %.  LABORATORY DATA: Lab Results  Component Value Date   WBC 6.9 09/08/2016   HGB 15.6 09/08/2016   HCT 46.3 09/08/2016   MCV 95.7 09/08/2016   PLT 239 09/08/2016      Chemistry      Component Value Date/Time   NA 139 09/08/2016 0914   K 4.1 09/08/2016 0914   CL 109 01/16/2015 0450   CO2 19 (L) 09/08/2016 0914   BUN 15.2 09/08/2016 0914   CREATININE 1.0 09/08/2016 0914      Component Value Date/Time   CALCIUM 9.1 09/08/2016 0914   ALKPHOS 100 09/08/2016 0914   AST 26 09/08/2016 0914   ALT 22 09/08/2016 0914   BILITOT 0.40 09/08/2016 0914       RADIOGRAPHIC STUDIES: No results found.  ASSESSMENT AND PLAN:  This is a very pleasant 56 years old white male with metastatic non-small cell lung cancer, adenocarcinoma with positive PDL 1 expression of 100%. He is currently undergoing treatment with Ketruda (pembrolizumab) 200 mg IV every 3 weeks, status post 28 cycles. The patient continues to tolerate his treatment fairly well with no significant adverse effects. I recommended for him to proceed with cycle #29 today as  scheduled. I would see him back for follow-up visit in 3 weeks for evaluation before starting cycle #30. He was advised to call immediately if he has any concerning symptoms in the interval. The patient voices understanding of current disease status and treatment options and is in agreement with the current care plan. All questions were answered. The patient knows to call the clinic with any problems, questions or concerns. We can certainly see the patient much sooner if necessary.  Disclaimer: This note was dictated with voice recognition software. Similar sounding words can inadvertently be transcribed and may not be corrected upon review.

## 2016-09-08 NOTE — Telephone Encounter (Signed)
Scheduled appt per 8/30 los - Gave patient AVS and calender per los.

## 2016-09-13 ENCOUNTER — Other Ambulatory Visit: Payer: Self-pay | Admitting: *Deleted

## 2016-09-13 DIAGNOSIS — G4700 Insomnia, unspecified: Secondary | ICD-10-CM

## 2016-09-13 MED ORDER — MIRTAZAPINE 30 MG PO TABS: 30.0000 mg | ORAL_TABLET | Freq: Every day | ORAL | 0 refills | Status: DC

## 2016-09-15 ENCOUNTER — Ambulatory Visit
Admission: RE | Admit: 2016-09-15 | Discharge: 2016-09-15 | Disposition: A | Discharge status: Home / Self Care | Payer: BLUE CROSS/BLUE SHIELD | Source: Ambulatory Visit | Attending: Radiation Oncology | Admitting: Radiation Oncology

## 2016-09-15 DIAGNOSIS — C7931 Secondary malignant neoplasm of brain: Secondary | ICD-10-CM

## 2016-09-15 MED ORDER — GADOBENATE DIMEGLUMINE 529 MG/ML IV SOLN
18.0000 mL | Freq: Once | INTRAVENOUS | Status: AC | PRN
  Administered 2016-09-15: 18 mL via INTRAVENOUS

## 2016-09-19 ENCOUNTER — Ambulatory Visit: Payer: Self-pay | Admitting: Radiation Oncology

## 2016-09-19 NOTE — Progress Notes (Signed)
Jamse Mead. Weimann 56 y.o. man with a left frontal brain metastasis from non-small cell lung cancer of the left lower lobe of the lung radiation completed 12-25-14 review 09-15-16 MRI brain w wo contrast, FU.  Headache:No Pain:No Dizziness:No Nausea/Vomiting/Ataxia:No Visional changes(Blurred/Diplopia (double vision), blind spots and peripheral vision changes):Wears glasses for reading,  Ring in ears:No Fatigue:No Cognitive changes:Alert and oriented x 3. Weight: Wt Readings from Last 3 Encounters:  09/20/16 187 lb 6.4 oz (85 kg)  09/08/16 184 lb 12.8 oz (83.8 kg)  08/18/16 183 lb 9.6 oz (83.3 kg)   Appetite:Good Imaging:09-15-16 MRI w wo contrast Lab:09-08-16 Cmet, CBCw diff 09-08-16 Saw Dr. Julien Nordmann First-line treatment with immunotherapy with [name] (pembrolizumab) 200 mg IV every 3 weeks status post 28 cycles. First cycle was given 02/18/2015  Taking Trental 400mg  bid BP 123/83   Pulse 69   Temp 97.6 F (36.4 C) (Oral)   Resp 18   Ht (!) 6" (0.152 m)   Wt 187 lb 6.4 oz (85 kg)   SpO2 96%   BMI 3659.91 kg/m

## 2016-09-20 ENCOUNTER — Ambulatory Visit
Admission: RE | Admit: 2016-09-20 | Discharge: 2016-09-20 | Disposition: A | Discharge status: Home / Self Care | Payer: BLUE CROSS/BLUE SHIELD | Source: Ambulatory Visit | Attending: Urology | Admitting: Urology

## 2016-09-20 ENCOUNTER — Encounter: Payer: Self-pay | Admitting: Urology

## 2016-09-20 VITALS — BP 123/83 | HR 69 | Temp 97.6°F | Resp 18 | Ht <= 58 in | Wt 187.4 lb

## 2016-09-20 DIAGNOSIS — C7931 Secondary malignant neoplasm of brain: Secondary | ICD-10-CM

## 2016-09-20 NOTE — Progress Notes (Signed)
Radiation Oncology         [phone] ________________________________  Name: Timothy Obrien MRN: [id number]  Date: 09/20/2016  DOB: Jan 26, 1960  Follow-Up Visit Note  CC: Timothy Deutscher, MD  Ditty, Kevan Ny, *  Diagnosis:  56 y.o.  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 Brain metastasis ([place]) C79.31     Interval Since Last Radiation:  1 year, 9 months  12/25/2014 Preop SRS Treatment: Left frontal 36 mm target was treated using 4 Dynamic Conformal Arcs to a prescription dose of 16 Gy.  ExacTrac registration was performed for each couch angle.    Narrative:  Mr. Blank is a pleasant 56 y.o. 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 Julien Nordmann.  He was treated with preoperative SRS to a solitary brain metastasis in 12/2014, followed by surgical resection. He continues in surveillance and his disease has been stable on brain imaging. His previous MRI imaging on 07/21/16 showed some progressive enhancement but stable size and no new lesions. 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  Recent MRI on 09/15/16 shows decreased size of left frontoparietal lesion with similar surrounding edema. Findings suggest interval response to therapy and there are no new lesions.   He is currently receiving Keytruda as his systemic therapy and has received a total of 29 cycles to date.  His case was discussed at recent multidisciplinary brain conference and recommendation is to continue Trental and Vitamin  E, with repeat brain MRI in 3 months.  He presents today with his  Ex-wife.  On review of systems, the patient reports that he is doing well overall. He denies any chest pain, shortness of breath, cough, fevers, chills, night sweats, unintended weight changes. He denies any bowel or bladder disturbances, and denies abdominal pain, nausea or vomiting. He denies headaches, visual or auditory disturbances, imbalance, weakness, seizures, or tremor. He denies any new musculoskeletal or joint aches or pains, new skin lesions or concerns. A complete review of systems is obtained and is otherwise negative.   Past Medical History:  Past Medical History:  Diagnosis Date  . Bronchitis   . Chronic fatigue 09/17/2015  . Depression 02/11/2016  . Hypertension 12/31/2015  . Lung cancer ([place])    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  . Pneumonia   . Seizures ([place])    last 12/11/14  . Shortness of breath dyspnea    with exertion  . Tobacco abuse     Past Surgical History: Past Surgical History:  Procedure Laterality Date  . APPLICATION OF CRANIAL NAVIGATION N/A 12/26/2014   Procedure: APPLICATION OF CRANIAL NAVIGATION;  Surgeon: Kevan Ny Ditty, MD;  Location: [facility];  Service: Neurosurgery;  Laterality: N/A;  . CRANIOTOMY N/A 12/26/2014   Procedure: CRANIOTOMY TUMOR EXCISION with BrainLab;  Surgeon: Kevan Ny Ditty, MD;  Location: [facility];  Service: Neurosurgery;  Laterality: N/A;  CRANIOTOMY TUMOR EXCISION with Stealth  . HERNIA REPAIR    . VASECTOMY    . VIDEO ASSISTED THORACOSCOPY (VATS)/WEDGE RESECTION  Left 01/14/2015   Procedure: VIDEO ASSISTED THORACOSCOPY (VATS)/WEDGE RESECTION, Superior segmentectomy left  lower lobe, wedge resection of left upper lobe, multiple lymph node disection, On Q insertion.;  Surgeon: Grace Isaac, MD;  Location: [facility];  Service: Thoracic;  Laterality: Left;  Marland Kitchen VIDEO BRONCHOSCOPY Bilateral 12/16/2014   Procedure: VIDEO BRONCHOSCOPY WITH FLUORO;  Surgeon: Collene Gobble, MD;  Location: [facility];  Service:  Cardiopulmonary;  Laterality: Bilateral;  . VIDEO BRONCHOSCOPY N/A 01/14/2015   Procedure: VIDEO BRONCHOSCOPY;  Surgeon: Grace Isaac, MD;  Location: [facility] OR;  Service: Thoracic;  Laterality: N/A;    Social History:  Social History   Social History  . Marital status: Divorced    Spouse name: N/A  . Number of children: N/A  . Years of education: N/A   Occupational History  . Not on file.   Social History Main Topics  . Smoking status: Former Smoker    Packs/day: 1.00    Years: 36.00    Types: Cigarettes    Start date: 12/14/1979    Quit date: 10/07/2015  . Smokeless tobacco: Current User     Comment: Vapes: Few cigars a day. Quit cigarettes after easter.  . Alcohol use 21.0 oz/week    35 Cans of beer per week     Comment: daily 6 beers a day for the past fefw years.   . Drug use: Yes    Types: Marijuana     Comment: last time 01/04/15  . Sexual activity: Not Currently   Other Topics Concern  . 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 He does have a cat but no other home pets. No mold exposure. Recent travel to [place] but with symptoms at the onset of travel.    Family History: Family History  Problem Relation Age of Onset  . Breast cancer Mother   . Colon polyps Mother   . Arthritis Father      ALLERGIES:  has No Known Allergies.  Meds: Current Outpatient Prescriptions  Medication Sig Dispense Refill  . mirtazapine (REMERON) 30 MG tablet Take 1 tablet (30 mg total) by mouth at bedtime. 90 tablet 0  . pentoxifylline (TRENTAL) 400 MG CR tablet Take 1 tablet (400 mg total) by mouth 2 (two) times daily after a meal. 60 tablet 5  . sodium chloride 0.9 % SOLN 50 mL with pembrolizumab 100 MG/4ML SOLN 200 mg Inject 200 mg into the vein every 21 ( twenty-one) days. 12 Dose 0  . vitamin E 400 UNIT capsule Take 400 Units by mouth daily.    Marland Kitchen ALPRAZolam (XANAX) 0.25 MG tablet Take 1 tablet by  mouth daily as needed.     No current facility-administered medications for this encounter.     Physical Findings: Wt Readings from Last 3 Encounters:  09/20/16 187 lb 6.4 oz (85 kg)  09/08/16 184 lb 12.8 oz (83.8 kg)  08/18/16 183 lb 9.6 oz (83.3 kg)   Temp Readings from Last 3 Encounters:  09/20/16 97.6 F (36.4 C) (Oral)  09/08/16 98.1 F (36.7 C) (Oral)  08/18/16 97.6 F (36.4 C) (Oral)   BP Readings from Last 3 Encounters:  09/20/16 123/83  09/08/16 127/84  08/18/16 131/89   Pulse Readings from Last 3 Encounters:  09/20/16 69  09/08/16 78  08/18/16 65  Pain Assessment Pain Score: 0-No pain/10  In general this is a well appearing caucasian male in no acute  distress. He's alert and oriented x4 and appropriate throughout the examination. Cardiopulmonary assessment is negative for acute distress and he exhibits normal effort. He is grossly intact neurologically.   Lab Findings: Lab Results  Component Value Date   WBC 6.9 09/08/2016   WBC 8.8 01/16/2015   HGB 15.6 09/08/2016   HCT 46.3 09/08/2016   PLT 239 09/08/2016    Lab Results  Component Value Date   NA 139 09/08/2016   K 4.1 09/08/2016   CHLORIDE 111 (H) 09/08/2016   CO2 19 (L) 09/08/2016   GLUCOSE 91 09/08/2016   BUN 15.2 09/08/2016   CREATININE 1.0 09/08/2016   BILITOT 0.40 09/08/2016   ALKPHOS 100 09/08/2016   AST 26 09/08/2016   ALT 22 09/08/2016   PROT 7.0 09/08/2016   ALBUMIN 3.7 09/08/2016   CALCIUM 9.1 09/08/2016   ANIONGAP 9 09/08/2016   ANIONGAP 7 01/16/2015    Radiographic Findings: Mr [name] BJ Contrast  Result Date: 09/15/2016 CLINICAL DATA:  Follow-up examination for intracranial metastasis, SRS protocol. Status post resection and radiation therapy. EXAM: MRI HEAD WITHOUT AND WITH CONTRAST TECHNIQUE: Multiplanar, multiecho pulse sequences of the brain and surrounding structures were obtained without and with intravenous contrast. CONTRAST:  94m MULTIHANCE GADOBENATE DIMEGLUMINE  529 MG/ML IV SOLN COMPARISON:  Previous MRI from 07/21/2016. FINDINGS: Brain: The left frontoparietal region has slightly collapsed any a 1, with decreased cystic changes seen centrally. Lesion is slightly decreased in size now measuring 10 x 14 x 10 mm (previously 11 x 14 x 14 mm). Intrinsic susceptibility artifact with T1 shortening again noted. Surrounding T2/FLAIR signal abnormality is not significantly changed. No significant regional mass effect. No new lesions identified. No other abnormal enhancement. No mass effect or midline shift. No acute infarct. Ventricles normal size without hydrocephalus. No extra-axial fluid collection. Vascular: Major intracranial vascular flow voids are maintained. Skull and upper cervical spine: Craniocervical junction normal. Visualized upper cervical spine unremarkable. Bone marrow signal intensity within normal limits. No focal osseous lesions. Sequelae of prior left frontoparietal craniotomy noted. Sinuses/Orbits: Globes and orbital soft tissues within normal limits. Retention cyst noted within the right maxillary sinus. Paranasal sinuses are otherwise clear. Trace right mastoid effusion. Inner ear structures normal. Other: None. IMPRESSION: 1. Slight interval collapse with decreased size of left frontoparietal lesion with similar surrounding edema. Findings suggest interval response to therapy. 2. No new lesions. Electronically Signed   By: BJeannine BogaM.D.   On: 09/15/2016 15:42    Impression/Plan:  1. 56y.o. gentleman with Stage IV, T2a, N1, M1b NSCLC, adenocarcinoma of the left lower lobe with brain metastases.  The patient's recent MRI suggests an interval response to therapy with a decrease in size of left the treated frontoparietal lesion and no new lesions.  The recommendation is to continue Trental '400mg'$  po BID and Vitamin E 400 IU BID with plans to repeat his imaging in 3 months' time for further evaluation. He is interested in potentially increasing  the interval between MRI scans so I [facility] and I will let him know the recommendation at the time of his next follow-up. He appears to have a good understanding of this plan and is in agreement.  He will contact [place] questions or concerns in the interim. 2. Anxiety/depression.  Patient continues taking Remeron for depression but is interested in changing medications due to the side effect of weight gain.  He has a scheduled appointment  with Dr. [name] next week with plans to address this concern and discuss other potential treatment options.     Nicholos Johns, PA-C

## 2016-09-29 ENCOUNTER — Encounter: Payer: Self-pay | Admitting: Internal Medicine

## 2016-09-29 ENCOUNTER — Ambulatory Visit (HOSPITAL_BASED_OUTPATIENT_CLINIC_OR_DEPARTMENT_OTHER): Payer: BLUE CROSS/BLUE SHIELD | Admitting: Internal Medicine

## 2016-09-29 ENCOUNTER — Ambulatory Visit (HOSPITAL_BASED_OUTPATIENT_CLINIC_OR_DEPARTMENT_OTHER): Payer: BLUE CROSS/BLUE SHIELD

## 2016-09-29 ENCOUNTER — Telehealth: Payer: Self-pay | Admitting: Internal Medicine

## 2016-09-29 ENCOUNTER — Other Ambulatory Visit (HOSPITAL_BASED_OUTPATIENT_CLINIC_OR_DEPARTMENT_OTHER): Payer: BLUE CROSS/BLUE SHIELD

## 2016-09-29 DIAGNOSIS — Z5112 Encounter for antineoplastic immunotherapy: Secondary | ICD-10-CM

## 2016-09-29 DIAGNOSIS — C7931 Secondary malignant neoplasm of brain: Secondary | ICD-10-CM

## 2016-09-29 DIAGNOSIS — C3432 Malignant neoplasm of lower lobe, left bronchus or lung: Secondary | ICD-10-CM

## 2016-09-29 DIAGNOSIS — C3490 Malignant neoplasm of unspecified part of unspecified bronchus or lung: Secondary | ICD-10-CM

## 2016-09-29 LAB — CBC WITH DIFFERENTIAL/PLATELET
BASO%: 1.2 % (ref 0.0–2.0)
Basophils Absolute: 0.1 10*3/uL (ref 0.0–0.1)
EOS%: 4.8 % (ref 0.0–7.0)
Eosinophils Absolute: 0.3 10*3/uL (ref 0.0–0.5)
HCT: 46.4 % (ref 38.4–49.9)
HGB: 15.7 g/dL (ref 13.0–17.1)
LYMPH#: 2.1 10*3/uL (ref 0.9–3.3)
LYMPH%: 29.2 % (ref 14.0–49.0)
MCH: 32 pg (ref 27.2–33.4)
MCHC: 33.9 g/dL (ref 32.0–36.0)
MCV: 94.5 fL (ref 79.3–98.0)
MONO#: 0.7 10*3/uL (ref 0.1–0.9)
MONO%: 9.9 % (ref 0.0–14.0)
NEUT%: 54.9 % (ref 39.0–75.0)
NEUTROS ABS: 3.9 10*3/uL (ref 1.5–6.5)
Platelets: 269 10*3/uL (ref 140–400)
RBC: 4.91 10*6/uL (ref 4.20–5.82)
RDW: 13.8 % (ref 11.0–14.6)
WBC: 7.1 10*3/uL (ref 4.0–10.3)

## 2016-09-29 LAB — COMPREHENSIVE METABOLIC PANEL
ALT: 20 U/L (ref 0–55)
AST: 24 U/L (ref 5–34)
Albumin: 3.7 g/dL (ref 3.5–5.0)
Alkaline Phosphatase: 96 U/L (ref 40–150)
Anion Gap: 10 mEq/L (ref 3–11)
BUN: 17 mg/dL (ref 7.0–26.0)
CHLORIDE: 111 meq/L — AB (ref 98–109)
CO2: 18 meq/L — AB (ref 22–29)
CREATININE: 1 mg/dL (ref 0.7–1.3)
Calcium: 9.1 mg/dL (ref 8.4–10.4)
EGFR: 82 mL/min/{1.73_m2} — ABNORMAL LOW (ref >90)
GLUCOSE: 94 mg/dL (ref 70–140)
Potassium: 4.5 mEq/L (ref 3.5–5.1)
Sodium: 139 mEq/L (ref 136–145)
TOTAL PROTEIN: 7.1 g/dL (ref 6.4–8.3)
Total Bilirubin: 0.3 mg/dL (ref 0.20–1.20)

## 2016-09-29 MED ORDER — SODIUM CHLORIDE 0.9 % IV SOLN
200.0000 mg | Freq: Once | INTRAVENOUS | Status: AC
  Administered 2016-09-29: 200 mg via INTRAVENOUS
  Filled 2016-09-29: qty 8

## 2016-09-29 MED ORDER — SODIUM CHLORIDE 0.9 % IV SOLN
Freq: Once | INTRAVENOUS | Status: AC
  Administered 2016-09-29: 10:00:00 via INTRAVENOUS

## 2016-09-29 NOTE — Telephone Encounter (Signed)
Scheduled appt per 9/20 los - patient to get new schedule in the treatment area - called before schedule was finished. Pt aware to pick up schedule there. Central radiology to contact with ct schedule.

## 2016-09-29 NOTE — Progress Notes (Signed)
[facility] [phone]   Fax:([phone]  OFFICE PROGRESS NOTE  Briscoe Deutscher, MD [address] [place] [address]  DIAGNOSIS:  Stage IV (T2a, N1, M1b) non-small cell lung cancer, adenocarcinoma, negative EGFR mutation but equivocal EGFR amplification, negative ALK gene translocation and negative ROS 1 but with PDL-1 expression 100% presented with left lower lobe lung mass in addition to left hilar adenopathy and solitary metastatic brain lesion diagnosed in December 2016.  PRIOR THERAPY: 1) status post stereotactic radiotherapy and surgical resection diagnosed in December 2016. 2) status post video bronchoscopy with left VATS and wedge resection of the left upper lobe and left lower lobe superior segmentectomy with lymph node dissection under the care of Dr. Servando Snare on 01/14/2015.  CURRENT THERAPY: First-line treatment with immunotherapy with Ketruda (pembrolizumab) 200 mg IV every 3 weeks status post 29 cycles. First cycle was given 02/18/2015.  INTERVAL HISTORY: Timothy Obrien 56 y.o. male returns to the clinic today for routine follow-up visit. The patient is feeling fine today was no specific complaints. He tolerated the last cycle of his treatment with [place] fairly well. He denied having any fever or chills. He has no nausea, vomiting, diarrhea or constipation. He denied having any weight loss or night sweats. The patient has no chest pain, shortness of breath, cough or hemoptysis. He is here today for evaluation before starting cycle #30.   MEDICAL HISTORY: Past Medical History:  Diagnosis Date  . Bronchitis   . Chronic fatigue 09/17/2015  . Depression 02/11/2016  . Hypertension 12/31/2015  . Lung cancer ([place])    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  . Pneumonia   . Seizures ([place])    last 12/11/14  . Shortness of breath dyspnea    with exertion  . Tobacco abuse     ALLERGIES:  has No Known Allergies.  MEDICATIONS:    Current Outpatient Prescriptions  Medication Sig Dispense Refill  . ALPRAZolam (XANAX) 0.25 MG tablet Take 1 tablet by mouth daily as needed.    . mirtazapine (REMERON) 30 MG tablet Take 1 tablet (30 mg total) by mouth at bedtime. 90 tablet 0  . pentoxifylline (TRENTAL) 400 MG CR tablet Take 1 tablet (400 mg total) by mouth 2 (two) times daily after a meal. 60 tablet 5  . sodium chloride 0.9 % SOLN 50 mL with pembrolizumab 100 MG/4ML SOLN 200 mg Inject 200 mg into the vein every 21 ( twenty-one) days. 12 Dose 0  . vitamin E 400 UNIT capsule Take 400 Units by mouth daily.     No current facility-administered medications for this visit.     SURGICAL HISTORY:  Past Surgical History:  Procedure Laterality Date  . APPLICATION OF CRANIAL NAVIGATION N/A 12/26/2014   Procedure: APPLICATION OF CRANIAL NAVIGATION;  Surgeon: Kevan Ny Ditty, MD;  Location: [facility]UR[facility] ORS;  Service: Neurosurgery;  Laterality: N/A;  . CRANIOTOMY N/A 12/26/2014   Procedure: CRANIOTOMY TUMOR EXCISION with BrainLab;  Surgeon: Kevan Ny Ditty, MD;  Location: [facility]URO ORS;  Service: Neurosurgery;  Laterality: N/A;  CRANIOTOMY TUMOR EXCISION with Stealth  . HERNIA REPAIR    . VASECTOMY    . VIDEO ASSISTED THORACOSCOPY (VATS)/WEDGE RESECTION Left 01/14/2015   Procedure: VIDEO ASSISTED THORACOSCOPY (VATS)/WEDGE RESECTION, Superior segmentectomy left  lower lobe, wedge resection of left upper lobe, multiple lymph node disection, On Q insertion.;  Surgeon: Grace Isaac, MD;  Location: [facility];  Service: Thoracic;  Laterality: Left;  Marland Kitchen VIDEO BRONCHOSCOPY Bilateral 12/16/2014   Procedure: VIDEO BRONCHOSCOPY WITH FLUORO;  Surgeon: Collene Gobble, MD;  Location: [facility];  Service: Cardiopulmonary;  Laterality: Bilateral;  . VIDEO BRONCHOSCOPY N/A 01/14/2015   Procedure: VIDEO BRONCHOSCOPY;  Surgeon: Grace Isaac, MD;  Location: [facility] OR;  Service: Thoracic;  Laterality: N/A;    REVIEW OF SYSTEMS:  A comprehensive  review of systems was negative.   PHYSICAL EXAMINATION: General appearance: alert, cooperative and no distress Head: Normocephalic, without obvious abnormality, atraumatic Neck: no adenopathy, no JVD, supple, symmetrical, trachea midline and thyroid not enlarged, symmetric, no tenderness/mass/nodules Lymph nodes: Cervical, supraclavicular, and axillary nodes normal. Resp: clear to auscultation bilaterally Back: symmetric, no curvature. ROM normal. No CVA tenderness. Cardio: regular rate and rhythm, S1, S2 normal, no murmur, click, rub or gallop GI: soft, non-tender; bowel sounds normal; no masses,  no organomegaly Extremities: extremities normal, atraumatic, no cyanosis or edema  ECOG PERFORMANCE STATUS: 0 - Asymptomatic  Blood pressure 134/81, pulse 69, temperature 98 F (36.7 C), temperature source Oral, resp. rate 17, weight 191 lb 11.2 oz (87 kg), SpO2 97 %.  LABORATORY DATA: Lab Results  Component Value Date   WBC 7.1 09/29/2016   HGB 15.7 09/29/2016   HCT 46.4 09/29/2016   MCV 94.5 09/29/2016   PLT 269 09/29/2016      Chemistry      Component Value Date/Time   NA 139 09/08/2016 0914   K 4.1 09/08/2016 0914   CL 109 01/16/2015 0450   CO2 19 (L) 09/08/2016 0914   BUN 15.2 09/08/2016 0914   CREATININE 1.0 09/08/2016 0914      Component Value Date/Time   CALCIUM 9.1 09/08/2016 0914   ALKPHOS 100 09/08/2016 0914   AST 26 09/08/2016 0914   ALT 22 09/08/2016 0914   BILITOT 0.40 09/08/2016 0914       RADIOGRAPHIC STUDIES: Mr [name] JO Contrast  Result Date: 09/15/2016 CLINICAL DATA:  Follow-up examination for intracranial metastasis, SRS protocol. Status post resection and radiation therapy. EXAM: MRI HEAD WITHOUT AND WITH CONTRAST TECHNIQUE: Multiplanar, multiecho pulse sequences of the brain and surrounding structures were obtained without and with intravenous contrast. CONTRAST:  13m MULTIHANCE GADOBENATE DIMEGLUMINE 529 MG/ML IV SOLN COMPARISON:  Previous MRI from  07/21/2016. FINDINGS: Brain: The left frontoparietal region has slightly collapsed any a 1, with decreased cystic changes seen centrally. Lesion is slightly decreased in size now measuring 10 x 14 x 10 mm (previously 11 x 14 x 14 mm). Intrinsic susceptibility artifact with T1 shortening again noted. Surrounding T2/FLAIR signal abnormality is not significantly changed. No significant regional mass effect. No new lesions identified. No other abnormal enhancement. No mass effect or midline shift. No acute infarct. Ventricles normal size without hydrocephalus. No extra-axial fluid collection. Vascular: Major intracranial vascular flow voids are maintained. Skull and upper cervical spine: Craniocervical junction normal. Visualized upper cervical spine unremarkable. Bone marrow signal intensity within normal limits. No focal osseous lesions. Sequelae of prior left frontoparietal craniotomy noted. Sinuses/Orbits: Globes and orbital soft tissues within normal limits. Retention cyst noted within the right maxillary sinus. Paranasal sinuses are otherwise clear. Trace right mastoid effusion. Inner ear structures normal. Other: None. IMPRESSION: 1. Slight interval collapse with decreased size of left frontoparietal lesion with similar surrounding edema. Findings suggest interval response to therapy. 2. No new lesions. Electronically Signed   By: BJeannine BogaM.D.   On: 09/15/2016 15:42    ASSESSMENT AND PLAN:  This is a  very pleasant 56 years old white male with metastatic non-small cell lung cancer, adenocarcinoma with positive PDL 1 expression of 100%. He is currently undergoing treatment with Ketruda (pembrolizumab) 200 mg IV every 3 weeks, status post 29 cycles. The patient tolerated the last cycle fairly well. I recommended for him to proceed with cycle #3 today as scheduled. I would see him back for follow-up visit in 3 weeks for evaluation after repeating CT scan of the chest, abdomen and pelvis for  restaging of his disease. The patient was advised to call immediately if he has any concerning symptoms in the interval. The patient voices understanding of current disease status and treatment options and is in agreement with the current care plan. All questions were answered. The patient knows to call the clinic with any problems, questions or concerns. We can certainly see the patient much sooner if necessary.  Disclaimer: This note was dictated with voice recognition software. Similar sounding words can inadvertently be transcribed and may not be corrected upon review.

## 2016-09-29 NOTE — Patient Instructions (Signed)
[facility] Discharge Instructions for Patients Receiving Chemotherapy  Today you received the following chemotherapy agents Keytruda  To help prevent nausea and vomiting after your treatment, we encourage you to take your nausea medication    If you develop nausea and vomiting that is not controlled by your nausea medication, call the clinic.   BELOW ARE SYMPTOMS THAT SHOULD BE REPORTED IMMEDIATELY:  *FEVER GREATER THAN 100.5 F  *CHILLS WITH OR WITHOUT FEVER  NAUSEA AND VOMITING THAT IS NOT CONTROLLED WITH YOUR NAUSEA MEDICATION  *UNUSUAL SHORTNESS OF BREATH  *UNUSUAL BRUISING OR BLEEDING  TENDERNESS IN MOUTH AND THROAT WITH OR WITHOUT PRESENCE OF ULCERS  *URINARY PROBLEMS  *BOWEL PROBLEMS  UNUSUAL RASH Items with * indicate a potential emergency and should be followed up as soon as possible.  Feel free to call the clinic you have any questions or concerns. The clinic phone number is [phone].  Please show the [facility] at check-in to the Emergency Department and triage nurse.

## 2016-10-18 ENCOUNTER — Ambulatory Visit (HOSPITAL_COMMUNITY)
Admission: RE | Admit: 2016-10-18 | Discharge: 2016-10-18 | Disposition: A | Discharge status: Home / Self Care | Payer: BLUE CROSS/BLUE SHIELD | Source: Ambulatory Visit | Attending: Internal Medicine | Admitting: Internal Medicine

## 2016-10-18 ENCOUNTER — Encounter (HOSPITAL_COMMUNITY): Payer: Self-pay

## 2016-10-18 DIAGNOSIS — J439 Emphysema, unspecified: Secondary | ICD-10-CM | POA: Insufficient documentation

## 2016-10-18 DIAGNOSIS — J984 Other disorders of lung: Secondary | ICD-10-CM | POA: Insufficient documentation

## 2016-10-18 DIAGNOSIS — Z9889 Other specified postprocedural states: Secondary | ICD-10-CM | POA: Insufficient documentation

## 2016-10-18 DIAGNOSIS — C3490 Malignant neoplasm of unspecified part of unspecified bronchus or lung: Secondary | ICD-10-CM | POA: Diagnosis present

## 2016-10-18 MED ORDER — IOPAMIDOL (ISOVUE-300) INJECTION 61%
INTRAVENOUS | Status: AC
  Filled 2016-10-18: qty 100

## 2016-10-18 MED ORDER — IOPAMIDOL (ISOVUE-300) INJECTION 61%
100.0000 mL | Freq: Once | INTRAVENOUS | Status: AC | PRN
  Administered 2016-10-18: 100 mL via INTRAVENOUS

## 2016-10-20 ENCOUNTER — Other Ambulatory Visit (HOSPITAL_BASED_OUTPATIENT_CLINIC_OR_DEPARTMENT_OTHER): Payer: BLUE CROSS/BLUE SHIELD

## 2016-10-20 ENCOUNTER — Ambulatory Visit (HOSPITAL_BASED_OUTPATIENT_CLINIC_OR_DEPARTMENT_OTHER): Payer: BLUE CROSS/BLUE SHIELD

## 2016-10-20 ENCOUNTER — Ambulatory Visit (HOSPITAL_BASED_OUTPATIENT_CLINIC_OR_DEPARTMENT_OTHER): Payer: BLUE CROSS/BLUE SHIELD | Admitting: Internal Medicine

## 2016-10-20 ENCOUNTER — Other Ambulatory Visit: Payer: Self-pay | Admitting: Internal Medicine

## 2016-10-20 ENCOUNTER — Telehealth: Payer: Self-pay | Admitting: Internal Medicine

## 2016-10-20 ENCOUNTER — Encounter: Payer: Self-pay | Admitting: Internal Medicine

## 2016-10-20 VITALS — BP 110/76 | HR 72 | Temp 97.5°F | Resp 18 | Ht 72.0 in | Wt 188.5 lb

## 2016-10-20 DIAGNOSIS — Z5112 Encounter for antineoplastic immunotherapy: Secondary | ICD-10-CM

## 2016-10-20 DIAGNOSIS — C771 Secondary and unspecified malignant neoplasm of intrathoracic lymph nodes: Secondary | ICD-10-CM

## 2016-10-20 DIAGNOSIS — C3432 Malignant neoplasm of lower lobe, left bronchus or lung: Secondary | ICD-10-CM

## 2016-10-20 DIAGNOSIS — C7931 Secondary malignant neoplasm of brain: Secondary | ICD-10-CM

## 2016-10-20 LAB — COMPREHENSIVE METABOLIC PANEL
ALBUMIN: 3.6 g/dL (ref 3.5–5.0)
ALT: 16 U/L (ref 0–55)
ANION GAP: 10 meq/L (ref 3–11)
AST: 23 U/L (ref 5–34)
Alkaline Phosphatase: 92 U/L (ref 40–150)
BILIRUBIN TOTAL: 0.48 mg/dL (ref 0.20–1.20)
BUN: 10.7 mg/dL (ref 7.0–26.0)
CALCIUM: 8.8 mg/dL (ref 8.4–10.4)
CHLORIDE: 111 meq/L — AB (ref 98–109)
CO2: 17 mEq/L — ABNORMAL LOW (ref 22–29)
CREATININE: 0.9 mg/dL (ref 0.7–1.3)
EGFR: >60 mL/min/{1.73_m2} (ref >60)
Glucose: 103 mg/dl (ref 70–140)
Potassium: 3.9 mEq/L (ref 3.5–5.1)
Sodium: 138 mEq/L (ref 136–145)
TOTAL PROTEIN: 6.8 g/dL (ref 6.4–8.3)

## 2016-10-20 LAB — CBC WITH DIFFERENTIAL/PLATELET
BASO%: 0.8 % (ref 0.0–2.0)
Basophils Absolute: 0.1 10*3/uL (ref 0.0–0.1)
EOS%: 1.8 % (ref 0.0–7.0)
Eosinophils Absolute: 0.2 10*3/uL (ref 0.0–0.5)
HEMATOCRIT: 43.8 % (ref 38.4–49.9)
HEMOGLOBIN: 14.8 g/dL (ref 13.0–17.1)
LYMPH#: 2.2 10*3/uL (ref 0.9–3.3)
LYMPH%: 20.7 % (ref 14.0–49.0)
MCH: 32.3 pg (ref 27.2–33.4)
MCHC: 33.8 g/dL (ref 32.0–36.0)
MCV: 95.5 fL (ref 79.3–98.0)
MONO#: 1 10*3/uL — AB (ref 0.1–0.9)
MONO%: 9.1 % (ref 0.0–14.0)
NEUT%: 67.6 % (ref 39.0–75.0)
NEUTROS ABS: 7.2 10*3/uL — AB (ref 1.5–6.5)
PLATELETS: 262 10*3/uL (ref 140–400)
RBC: 4.59 10*6/uL (ref 4.20–5.82)
RDW: 13.5 % (ref 11.0–14.6)
WBC: 10.7 10*3/uL — AB (ref 4.0–10.3)

## 2016-10-20 LAB — TSH: TSH: 0.724 m[IU]/L (ref 0.320–4.118)

## 2016-10-20 MED ORDER — SODIUM CHLORIDE 0.9 % IV SOLN
200.0000 mg | Freq: Once | INTRAVENOUS | Status: AC
  Administered 2016-10-20: 200 mg via INTRAVENOUS
  Filled 2016-10-20: qty 8

## 2016-10-20 MED ORDER — SODIUM CHLORIDE 0.9 % IV SOLN
Freq: Once | INTRAVENOUS | Status: AC
  Administered 2016-10-20: 10:00:00 via INTRAVENOUS

## 2016-10-20 NOTE — Patient Instructions (Signed)
[facility] Discharge Instructions for Patients Receiving Chemotherapy  Today you received the following chemotherapy agents:  Keytruda.  To help prevent nausea and vomiting after your treatment, we encourage you to take your nausea medication as directed.   If you develop nausea and vomiting that is not controlled by your nausea medication, call the clinic.   BELOW ARE SYMPTOMS THAT SHOULD BE REPORTED IMMEDIATELY:  *FEVER GREATER THAN 100.5 F  *CHILLS WITH OR WITHOUT FEVER  NAUSEA AND VOMITING THAT IS NOT CONTROLLED WITH YOUR NAUSEA MEDICATION  *UNUSUAL SHORTNESS OF BREATH  *UNUSUAL BRUISING OR BLEEDING  TENDERNESS IN MOUTH AND THROAT WITH OR WITHOUT PRESENCE OF ULCERS  *URINARY PROBLEMS  *BOWEL PROBLEMS  UNUSUAL RASH Items with * indicate a potential emergency and should be followed up as soon as possible.  Feel free to call the clinic should you have any questions or concerns. The clinic phone number is [phone].  Please show the CHEMO ALERT CARD at check-in to the Emergency Department and triage nurse.    

## 2016-10-20 NOTE — Telephone Encounter (Signed)
Gave calendar for November and December

## 2016-10-20 NOTE — Progress Notes (Signed)
[place] Telephone:([phone]) [phone]   Fax:(336) [phone]  OFFICE PROGRESS NOTE  Timothy Deutscher, MD [address] [place] [address]  DIAGNOSIS:  Stage IV (T2a, N1, M1b) non-small cell lung cancer, adenocarcinoma, negative EGFR mutation but equivocal EGFR amplification, negative ALK gene translocation and negative ROS 1 but with PDL-1 expression 100% presented with left lower lobe lung mass in addition to left hilar adenopathy and solitary metastatic brain lesion diagnosed in December 2016.  PRIOR THERAPY: 1) status post stereotactic radiotherapy and surgical resection diagnosed in December 2016. 2) status post video bronchoscopy with left VATS and wedge resection of the left upper lobe and left lower lobe superior segmentectomy with lymph node dissection under the care of Dr. Servando Snare on 01/14/2015.  CURRENT THERAPY: First-line treatment with immunotherapy with Ketruda (pembrolizumab) 200 mg IV every 3 weeks status post 30 cycles. First cycle was given 02/18/2015.  INTERVAL HISTORY: Timothy Obrien 56 y.o. male returns to the clinic today for follow-up visit. The patient continues to tolerate his treatment with [name] (pembrolizumab) fairly well. He denied having any chest pain, shortness of breath, cough or hemoptysis. He has no weight loss or night sweats. He has no nausea, vomiting, diarrhea or constipation. The patient denied having any headache or visual changes. He had repeat CT scan of the chest, abdomen and pelvis performed recently and he is here for evaluation and discussion of his scan results.   MEDICAL HISTORY: Past Medical History:  Diagnosis Date  . Bronchitis   . Chronic fatigue 09/17/2015  . Depression 02/11/2016  . Hypertension 12/31/2015  . Lung cancer ([facility])    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  . Pneumonia   . Seizures ([facility])    last 12/11/14  . Shortness of breath dyspnea    with exertion  . Tobacco abuse     ALLERGIES:  has No  Known Allergies.  MEDICATIONS:  Current Outpatient Prescriptions  Medication Sig Dispense Refill  . ALPRAZolam (XANAX) 0.25 MG tablet Take 1 tablet by mouth daily as needed.    . mirtazapine (REMERON) 30 MG tablet Take 1 tablet (30 mg total) by mouth at bedtime. 90 tablet 0  . pentoxifylline (TRENTAL) 400 MG CR tablet Take 1 tablet (400 mg total) by mouth 2 (two) times daily after a meal. 60 tablet 5  . sodium chloride 0.9 % SOLN 50 mL with pembrolizumab 100 MG/4ML SOLN 200 mg Inject 200 mg into the vein every 21 ( twenty-one) days. 12 Dose 0  . vitamin E 400 UNIT capsule Take 400 Units by mouth daily.     No current facility-administered medications for this visit.     SURGICAL HISTORY:  Past Surgical History:  Procedure Laterality Date  . APPLICATION OF CRANIAL NAVIGATION N/A 12/26/2014   Procedure: APPLICATION OF CRANIAL NAVIGATION;  Surgeon: Kevan Ny Ditty, MD;  Location: [facility];  Service: Neurosurgery;  Laterality: N/A;  . CRANIOTOMY N/A 12/26/2014   Procedure: CRANIOTOMY TUMOR EXCISION with [facility];  Surgeon: Kevan Ny Ditty, MD;  Location: [facility];  Service: Neurosurgery;  Laterality: N/A;  CRANIOTOMY TUMOR EXCISION with [facility]  . HERNIA REPAIR    . VASECTOMY    . VIDEO ASSISTED THORACOSCOPY (VATS)/WEDGE RESECTION Left 01/14/2015   Procedure: VIDEO ASSISTED THORACOSCOPY (VATS)/WEDGE RESECTION, Superior segmentectomy left  lower lobe, wedge resection of left upper lobe, multiple lymph node disection, On Q insertion.;  Surgeon: Grace Isaac, MD;  Location: [facility];  Service: Thoracic;  Laterality: Left;  . VIDEO BRONCHOSCOPY Bilateral 12/16/2014   Procedure: VIDEO BRONCHOSCOPY WITH FLUORO;  Surgeon: Robert S Byrum, MD;  Location: [facility];  Service: Cardiopulmonary;  Laterality: Bilateral;  . VIDEO BRONCHOSCOPY N/A 01/14/2015   Procedure: VIDEO BRONCHOSCOPY;  Surgeon: Edward B Gerhardt, MD;  Location: [facility];  Service: Thoracic;  Laterality: N/A;    REVIEW  OF SYSTEMS:  Constitutional: negative Eyes: negative Ears, nose, mouth, throat, and face: negative Respiratory: negative Cardiovascular: negative Gastrointestinal: negative Genitourinary:negative Integument/breast: negative Hematologic/lymphatic: negative Musculoskeletal:negative Neurological: negative Behavioral/Psych: negative Endocrine: negative Allergic/Immunologic: negative   PHYSICAL EXAMINATION: General appearance: alert, cooperative and no distress Head: Normocephalic, without obvious abnormality, atraumatic Neck: no adenopathy, no JVD, supple, symmetrical, trachea midline and thyroid not enlarged, symmetric, no tenderness/mass/nodules Lymph nodes: Cervical, supraclavicular, and axillary nodes normal. Resp: clear to auscultation bilaterally Back: symmetric, no curvature. ROM normal. No CVA tenderness. Cardio: regular rate and rhythm, S1, S2 normal, no murmur, click, rub or gallop GI: soft, non-tender; bowel sounds normal; no masses,  no organomegaly Extremities: extremities normal, atraumatic, no cyanosis or edema Neurologic: Alert and oriented X 3, normal strength and tone. Normal symmetric reflexes. Normal coordination and gait  ECOG PERFORMANCE STATUS: 0 - Asymptomatic  Blood pressure 110/76, pulse 72, temperature (!) 97.5 F (36.4 C), temperature source Oral, resp. rate 18, height 6' (1.829 m), weight 188 lb 8 oz (85.5 kg), SpO2 100 %.  LABORATORY DATA: Lab Results  Component Value Date   WBC 10.7 (H) 10/20/2016   HGB 14.8 10/20/2016   HCT 43.8 10/20/2016   MCV 95.5 10/20/2016   PLT 262 10/20/2016      Chemistry      Component Value Date/Time   NA 138 10/20/2016 0842   K 3.9 10/20/2016 0842   CL 109 01/16/2015 0450   CO2 17 (L) 10/20/2016 0842   BUN 10.7 10/20/2016 0842   CREATININE 0.9 10/20/2016 0842      Component Value Date/Time   CALCIUM 8.8 10/20/2016 0842   ALKPHOS 92 10/20/2016 0842   AST 23 10/20/2016 0842   ALT 16 10/20/2016 0842   BILITOT  0.48 10/20/2016 0842       RADIOGRAPHIC STUDIES: Ct Chest W Contrast  Result Date: 10/18/2016 CLINICAL DATA:  Restaging non-small cell lung cancer. EXAM: CT CHEST, ABDOMEN, AND PELVIS WITH CONTRAST TECHNIQUE: Multidetector CT imaging of the chest, abdomen and pelvis was performed following the standard protocol during bolus administration of intravenous contrast. CONTRAST:  100mL ISOVUE-300 IOPAMIDOL (ISOVUE-300) INJECTION 61% COMPARISON:  Numerous prior studies.  The most recent is 07/25/2016 FINDINGS: CT CHEST FINDINGS Cardiovascular: The heart is normal in size. No pericardial effusion. Mild tortuosity and minimal calcification involving the thoracic aorta. Scattered coronary artery calcifications noted. Mediastinum/Nodes: No mediastinal or hilar mass or adenopathy. The esophagus is grossly normal. Lungs/Pleura: Stable emphysematous changes and areas of pulmonary scarring. Stable nodular density in the superior segment of the left lower lobe adjacent to the suture line. This measures a maximum of 13 x 10 mm. It most recently measured 15 x 10.5 mm. Musculoskeletal: No significant bony findings. No worrisome bone lesions. 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 CT ABDOMEN PELVIS FINDINGS Hepatobiliary: No focal hepatic lesions or intrahepatic biliary dilatation. The gallbladder is mildly contracted. No common bile duct dilatation. Pancreas: No mass, inflammation or ductal dilatation. Spleen: Normal size.  No focal lesions. Adrenals/Urinary Tract: The adrenal glands and kidneys are normal. No ureteral or bladder calculi. Stable small right renal cyst.   Stomach/Bowel: The stomach, duodenum, and colon are unremarkable. No acute inflammatory or mass lesions. Vascular/Lymphatic: The aorta is normal in caliber. No dissection. Advanced atherosclerotic calcifications, particularly at the bifurcation. The branch vessels are patent. The major venous structures are patent. No mesenteric or  retroperitoneal mass or adenopathy. Small scattered lymph nodes are noted. Reproductive: The prostate gland and seminal vesicles are unremarkable. Other: No pelvic mass or adenopathy. No free pelvic fluid collections. No inguinal mass or adenopathy. Small periumbilical abdominal wall hernia containing fat. Musculoskeletal: No significant bony findings. IMPRESSION: 1. Stable postoperative scarring changes involving the left lung with a thick band of scar tissue near the suture line. 2. No mediastinal or hilar lymphadenopathy. 3. Stable emphysematous changes and pulmonary scarring. No acute pulmonary findings. 4. No findings for abdominal/pelvic metastatic disease or osseous metastatic disease. Electronically Signed   By: P.  Gallerani M.D.   On: 10/18/2016 14:32   Ct Abdomen Pelvis W Contrast  Result Date: 10/18/2016 CLINICAL DATA:  Restaging non-small cell lung cancer. EXAM: CT CHEST, ABDOMEN, AND PELVIS WITH CONTRAST TECHNIQUE: Multidetector CT imaging of the chest, abdomen and pelvis was performed following the standard protocol during bolus administration of intravenous contrast. CONTRAST:  100mL ISOVUE-300 IOPAMIDOL (ISOVUE-300) INJECTION 61% COMPARISON:  Numerous prior studies.  The most recent is 07/25/2016 FINDINGS: CT CHEST FINDINGS Cardiovascular: The heart is normal in size. No pericardial effusion. Mild tortuosity and minimal calcification involving the thoracic aorta. Scattered coronary artery calcifications noted. Mediastinum/Nodes: No mediastinal or hilar mass or adenopathy. The esophagus is grossly normal. Lungs/Pleura: Stable emphysematous changes and areas of pulmonary scarring. Stable nodular density in the superior segment of the left lower lobe adjacent to the suture line. This measures a maximum of 13 x 10 mm. It most recently measured 15 x 10.5 mm. Musculoskeletal: No significant bony findings. No worrisome bone lesions. 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 CT  ABDOMEN PELVIS FINDINGS Hepatobiliary: No focal hepatic lesions or intrahepatic biliary dilatation. The gallbladder is mildly contracted. No common bile duct dilatation. Pancreas: No mass, inflammation or ductal dilatation. Spleen: Normal size.  No focal lesions. Adrenals/Urinary Tract: The adrenal glands and kidneys are normal. No ureteral or bladder calculi. Stable small right renal cyst. Stomach/Bowel: The stomach, duodenum, and colon are unremarkable. No acute inflammatory or mass lesions. Vascular/Lymphatic: The aorta is normal in caliber. No dissection. Advanced atherosclerotic calcifications, particularly at the bifurcation. The branch vessels are patent. The major venous structures are patent. No mesenteric or retroperitoneal mass or adenopathy. Small scattered lymph nodes are noted. Reproductive: The prostate gland and seminal vesicles are unremarkable. Other: No pelvic mass or adenopathy. No free pelvic fluid collections. No inguinal mass or adenopathy. Small periumbilical abdominal wall hernia containing fat. Musculoskeletal: No significant bony findings. IMPRESSION: 1. Stable postoperative scarring changes involving the left lung with a thick band of scar tissue near the suture line. 2. No mediastinal or hilar lymphadenopathy. 3. Stable emphysematous changes and pulmonary scarring. No acute pulmonary findings. 4. No findings for abdominal/pelvic metastatic disease or osseous metastatic disease. Electronically Signed   By: P.  Gallerani M.D.   On: 10/18/2016 14:32    ASSESSMENT AND PLAN:  This is a very pleasant 55 years old white male with metastatic non-small cell lung cancer, adenocarcinoma with positive PDL 1 expression of 100%. He is currently undergoing treatment with Ketruda (pembrolizumab) 200 mg IV every 3 weeks, status post 30 cycles. He tolerated the last cycle of his treatment fairly well with no significant adverse effects.   He had repeat CT scan of the chest, abdomen and pelvis  performed recently. I personally and independently reviewed the scans and discuss the results with the patient today. His scan showed no evidence for disease progression. I recommended for the patient to continue his current treatment with Ketruda and he will proceed with cycle #30 one today. The patient has a lot of question about his treatment after 2 years of Ketruda. He has concern about stopping Ketruda after 2 years because he is doing very well. I recommended for him to continue his treatment for now and we will discuss this option in more details when the patient reached the 2 years final dose. He was advised to call immediately if he has any concerning symptoms in the interval. The patient voices understanding of current disease status and treatment options and is in agreement with the current care plan. All questions were answered. The patient knows to call the clinic with any problems, questions or concerns. We can certainly see the patient much sooner if necessary.  Disclaimer: This note was dictated with voice recognition software. Similar sounding words can inadvertently be transcribed and may not be corrected upon review.       

## 2016-11-10 ENCOUNTER — Encounter: Payer: Self-pay | Admitting: *Deleted

## 2016-11-10 ENCOUNTER — Telehealth: Payer: Self-pay

## 2016-11-10 ENCOUNTER — Ambulatory Visit (HOSPITAL_BASED_OUTPATIENT_CLINIC_OR_DEPARTMENT_OTHER): Payer: BLUE CROSS/BLUE SHIELD | Admitting: Internal Medicine

## 2016-11-10 ENCOUNTER — Encounter: Payer: Self-pay | Admitting: Internal Medicine

## 2016-11-10 ENCOUNTER — Ambulatory Visit (HOSPITAL_BASED_OUTPATIENT_CLINIC_OR_DEPARTMENT_OTHER): Payer: BLUE CROSS/BLUE SHIELD

## 2016-11-10 ENCOUNTER — Other Ambulatory Visit (HOSPITAL_BASED_OUTPATIENT_CLINIC_OR_DEPARTMENT_OTHER): Payer: BLUE CROSS/BLUE SHIELD

## 2016-11-10 VITALS — BP 135/83 | HR 67 | Temp 97.7°F | Resp 20 | Ht 72.0 in | Wt 183.6 lb

## 2016-11-10 DIAGNOSIS — C771 Secondary and unspecified malignant neoplasm of intrathoracic lymph nodes: Secondary | ICD-10-CM

## 2016-11-10 DIAGNOSIS — Z5112 Encounter for antineoplastic immunotherapy: Secondary | ICD-10-CM

## 2016-11-10 DIAGNOSIS — C3432 Malignant neoplasm of lower lobe, left bronchus or lung: Secondary | ICD-10-CM

## 2016-11-10 DIAGNOSIS — C7931 Secondary malignant neoplasm of brain: Secondary | ICD-10-CM

## 2016-11-10 LAB — COMPREHENSIVE METABOLIC PANEL
ALBUMIN: 3.9 g/dL (ref 3.5–5.0)
ALK PHOS: 86 U/L (ref 40–150)
ALT: 18 U/L (ref 0–55)
ANION GAP: 10 meq/L (ref 3–11)
AST: 24 U/L (ref 5–34)
BILIRUBIN TOTAL: 0.37 mg/dL (ref 0.20–1.20)
BUN: 10 mg/dL (ref 7.0–26.0)
CALCIUM: 8.7 mg/dL (ref 8.4–10.4)
CO2: 18 mEq/L — ABNORMAL LOW (ref 22–29)
CREATININE: 0.9 mg/dL (ref 0.7–1.3)
Chloride: 112 mEq/L — ABNORMAL HIGH (ref 98–109)
Glucose: 102 mg/dl (ref 70–140)
Potassium: 4.1 mEq/L (ref 3.5–5.1)
Sodium: 139 mEq/L (ref 136–145)
TOTAL PROTEIN: 6.9 g/dL (ref 6.4–8.3)

## 2016-11-10 LAB — CBC WITH DIFFERENTIAL/PLATELET
BASO%: 1.3 % (ref 0.0–2.0)
Basophils Absolute: 0.1 10*3/uL (ref 0.0–0.1)
EOS%: 1.2 % (ref 0.0–7.0)
Eosinophils Absolute: 0.1 10*3/uL (ref 0.0–0.5)
HEMATOCRIT: 44.7 % (ref 38.4–49.9)
HGB: 15.1 g/dL (ref 13.0–17.1)
LYMPH#: 1.3 10*3/uL (ref 0.9–3.3)
LYMPH%: 19.8 % (ref 14.0–49.0)
MCH: 32.5 pg (ref 27.2–33.4)
MCHC: 33.9 g/dL (ref 32.0–36.0)
MCV: 96 fL (ref 79.3–98.0)
MONO#: 0.6 10*3/uL (ref 0.1–0.9)
MONO%: 9.3 % (ref 0.0–14.0)
NEUT%: 68.4 % (ref 39.0–75.0)
NEUTROS ABS: 4.5 10*3/uL (ref 1.5–6.5)
PLATELETS: 288 10*3/uL (ref 140–400)
RBC: 4.66 10*6/uL (ref 4.20–5.82)
RDW: 13.6 % (ref 11.0–14.6)
WBC: 6.6 10*3/uL (ref 4.0–10.3)

## 2016-11-10 MED ORDER — SODIUM CHLORIDE 0.9 % IV SOLN
200.0000 mg | Freq: Once | INTRAVENOUS | Status: AC
  Administered 2016-11-10: 200 mg via INTRAVENOUS
  Filled 2016-11-10: qty 8

## 2016-11-10 MED ORDER — SODIUM CHLORIDE 0.9 % IV SOLN
Freq: Once | INTRAVENOUS | Status: AC
  Administered 2016-11-10: 11:00:00 via INTRAVENOUS

## 2016-11-10 NOTE — Telephone Encounter (Signed)
Scheduled appointments for January. Infusion will print patient avs and calender. Per 11/1 los

## 2016-11-10 NOTE — Patient Instructions (Signed)
[facility] Discharge Instructions for Patients Receiving Chemotherapy  Today you received the following chemotherapy agents:  Keytruda.  To help prevent nausea and vomiting after your treatment, we encourage you to take your nausea medication as directed.   If you develop nausea and vomiting that is not controlled by your nausea medication, call the clinic.   BELOW ARE SYMPTOMS THAT SHOULD BE REPORTED IMMEDIATELY:  *FEVER GREATER THAN 100.5 F  *CHILLS WITH OR WITHOUT FEVER  NAUSEA AND VOMITING THAT IS NOT CONTROLLED WITH YOUR NAUSEA MEDICATION  *UNUSUAL SHORTNESS OF BREATH  *UNUSUAL BRUISING OR BLEEDING  TENDERNESS IN MOUTH AND THROAT WITH OR WITHOUT PRESENCE OF ULCERS  *URINARY PROBLEMS  *BOWEL PROBLEMS  UNUSUAL RASH Items with * indicate a potential emergency and should be followed up as soon as possible.  Feel free to call the clinic should you have any questions or concerns. The clinic phone number is [phone].  Please show the CHEMO ALERT CARD at check-in to the Emergency Department and triage nurse.    

## 2016-11-10 NOTE — Progress Notes (Signed)
[place] [phone]) [phone]   [phone]  OFFICE PROGRESS NOTE  Briscoe Deutscher, MD [address] [place] [address]  DIAGNOSIS:  Stage IV (T2a, N1, M1b) non-small cell lung cancer, adenocarcinoma, negative EGFR mutation but equivocal EGFR amplification, negative ALK gene translocation and negative ROS 1 but with PDL-1 expression 100% presented with left lower lobe lung mass in addition to left hilar adenopathy and solitary metastatic brain lesion diagnosed in December 2016.  PRIOR THERAPY: 1) status post stereotactic radiotherapy and surgical resection diagnosed in December 2016. 2) status post video bronchoscopy with left VATS and wedge resection of the left upper lobe and left lower lobe superior segmentectomy with lymph node dissection under the care of Dr. Servando Snare on 01/14/2015.  CURRENT THERAPY: First-line treatment with immunotherapy with Ketruda (pembrolizumab) 200 mg IV every 3 weeks status post 31 cycles. First cycle was given 02/18/2015.  INTERVAL HISTORY: Timothy Obrien 56 y.o. male returns to the clinic today for follow-up visit.  The patient continues to tolerate his treatment with [facility] fairly well.  He denied having any chest pain, shortness of breath, cough or hemoptysis.  He denied having any fever or chills.  He has no nausea, vomiting, diarrhea or constipation.  Is here today for evaluation before starting cycle #32.    MEDICAL HISTORY: Past Medical History:  Diagnosis Date  . Bronchitis   . Chronic fatigue 09/17/2015  . Depression 02/11/2016  . Hypertension 12/31/2015  . Lung cancer ([place])    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  . Pneumonia   . Seizures ([place])    last 12/11/14  . Shortness of breath dyspnea    with exertion  . Tobacco abuse     ALLERGIES:  has No Known Allergies.  MEDICATIONS:  Current Outpatient Prescriptions  Medication Sig Dispense Refill  . ALPRAZolam (XANAX) 0.25 MG tablet Take 1  tablet by mouth daily as needed.    . mirtazapine (REMERON) 30 MG tablet Take 1 tablet (30 mg total) by mouth at bedtime. 90 tablet 0  . pentoxifylline (TRENTAL) 400 MG CR tablet Take 1 tablet (400 mg total) by mouth 2 (two) times daily after a meal. 60 tablet 5  . sodium chloride 0.9 % SOLN 50 mL with pembrolizumab 100 MG/4ML SOLN 200 mg Inject 200 mg into the vein every 21 ( twenty-one) days. 12 Dose 0  . vitamin E 400 UNIT capsule Take 400 Units by mouth daily.     No current facility-administered medications for this visit.     SURGICAL HISTORY:  Past Surgical History:  Procedure Laterality Date  . APPLICATION OF CRANIAL NAVIGATION N/A 12/26/2014   Procedure: APPLICATION OF CRANIAL NAVIGATION;  Surgeon: Kevan Ny Ditty, MD;  Location: [place] [facility];  Service: Neurosurgery;  Laterality: N/A;  . CRANIOTOMY N/A 12/26/2014   Procedure: CRANIOTOMY TUMOR EXCISION with BrainLab;  Surgeon: Kevan Ny Ditty, MD;  Location: [place] [facility]UR[facility] OR[facility];  Service: Neurosurgery;  Laterality: N/A;  CRANIOTOMY TUMOR EXCISION with Stealth  . HERNIA REPAIR    . VASECTOMY    . VIDEO ASSISTED THORACOSCOPY (VATS)/WEDGE RESECTION Left 01/14/2015   Procedure: VIDEO ASSISTED THORACOSCOPY (VATS)/WEDGE RESECTION, Superior segmentectomy left  lower lobe, wedge resection of left upper lobe, multiple lymph node disection, On Q insertion.;  Surgeon: Grace Isaac, MD;  Location: [place];  Service: Thoracic;  Laterality: Left;  Marland Kitchen VIDEO BRONCHOSCOPY Bilateral 12/16/2014   Procedure: VIDEO BRONCHOSCOPY WITH FLUORO;  Surgeon:  Collene Gobble, MD;  Location: [place];  Service: Cardiopulmonary;  Laterality: Bilateral;  . VIDEO BRONCHOSCOPY N/A 01/14/2015   Procedure: VIDEO BRONCHOSCOPY;  Surgeon: Grace Isaac, MD;  Location: [facility];  Service: Thoracic;  Laterality: N/A;    REVIEW OF SYSTEMS:  A comprehensive review of systems was negative.   PHYSICAL EXAMINATION: General appearance: alert, cooperative and no  distress Head: Normocephalic, without obvious abnormality, atraumatic Neck: no adenopathy, no JVD, supple, symmetrical, trachea midline and thyroid not enlarged, symmetric, no tenderness/mass/nodules Lymph nodes: Cervical, supraclavicular, and axillary nodes normal. Resp: clear to auscultation bilaterally Back: symmetric, no curvature. ROM normal. No CVA tenderness. Cardio: regular rate and rhythm, S1, S2 normal, no murmur, click, rub or gallop GI: soft, non-tender; bowel sounds normal; no masses,  no organomegaly Extremities: extremities normal, atraumatic, no cyanosis or edema  ECOG PERFORMANCE STATUS: 0 - Asymptomatic  Blood pressure 135/83, pulse 67, temperature 97.7 F (36.5 C), temperature source Oral, resp. rate 20, height 6' (1.829 m), weight 183 lb 9.6 oz (83.3 kg), SpO2 99 %.  LABORATORY DATA: Lab Results  Component Value Date   WBC 6.6 11/10/2016   HGB 15.1 11/10/2016   HCT 44.7 11/10/2016   MCV 96.0 11/10/2016   PLT 288 11/10/2016      Chemistry      Component Value Date/Time   NA 139 11/10/2016 0934   K 4.1 11/10/2016 0934   CL 109 01/16/2015 0450   CO2 18 (L) 11/10/2016 0934   BUN 10.0 11/10/2016 0934   CREATININE 0.9 11/10/2016 0934      Component Value Date/Time   CALCIUM 8.7 11/10/2016 0934   ALKPHOS 86 11/10/2016 0934   AST 24 11/10/2016 0934   ALT 18 11/10/2016 0934   BILITOT 0.37 11/10/2016 0934       RADIOGRAPHIC STUDIES: Ct Chest W Contrast  Result Date: 10/18/2016 CLINICAL DATA:  Restaging non-small cell lung cancer. EXAM: CT CHEST, ABDOMEN, AND PELVIS WITH CONTRAST TECHNIQUE: Multidetector CT imaging of the chest, abdomen and pelvis was performed following the standard protocol during bolus administration of intravenous contrast. CONTRAST:  186m ISOVUE-300 IOPAMIDOL (ISOVUE-300) INJECTION 61% COMPARISON:  Numerous prior studies.  The most recent is 07/25/2016 FINDINGS: CT CHEST FINDINGS Cardiovascular: The heart is normal in size. No  pericardial effusion. Mild tortuosity and minimal calcification involving the thoracic aorta. Scattered coronary artery calcifications noted. Mediastinum/Nodes: No mediastinal or hilar mass or adenopathy. The esophagus is grossly normal. Lungs/Pleura: Stable emphysematous changes and areas of pulmonary scarring. Stable nodular density in the superior segment of the left lower lobe adjacent to the suture line. This measures a maximum of 13 x 10 mm. It most recently measured 15 x 10.5 mm. Musculoskeletal: No significant bony findings. No worrisome bone lesions. 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 CT ABDOMEN PELVIS FINDINGS Hepatobiliary: No focal hepatic lesions or intrahepatic biliary dilatation. The gallbladder is mildly contracted. No common bile duct dilatation. Pancreas: No mass, inflammation or ductal dilatation. Spleen: Normal size.  No focal lesions. Adrenals/Urinary Tract: The adrenal glands and kidneys are normal. No ureteral or bladder calculi. Stable small right renal cyst. Stomach/Bowel: The stomach, duodenum, and colon are unremarkable. No acute inflammatory or mass lesions. Vascular/Lymphatic: The aorta is normal in caliber. No dissection. Advanced atherosclerotic calcifications, particularly at the bifurcation. The branch vessels are patent. The major venous structures are patent. No mesenteric or retroperitoneal mass or adenopathy. Small scattered lymph nodes are noted. Reproductive: The prostate gland and seminal vesicles are unremarkable.  Other: No pelvic mass or adenopathy. No free pelvic fluid collections. No inguinal mass or adenopathy. Small periumbilical abdominal wall hernia containing fat. Musculoskeletal: No significant bony findings. IMPRESSION: 1. Stable postoperative scarring changes involving the left lung with a thick band of scar tissue near the suture line. 2. No mediastinal or hilar lymphadenopathy. 3. Stable emphysematous changes and pulmonary scarring. No  acute pulmonary findings. 4. No findings for abdominal/pelvic metastatic disease or osseous metastatic disease. Electronically Signed   By: Marijo Sanes M.D.   On: 10/18/2016 14:32   Ct Abdomen Pelvis W Contrast  Result Date: 10/18/2016 CLINICAL DATA:  Restaging non-small cell lung cancer. EXAM: CT CHEST, ABDOMEN, AND PELVIS WITH CONTRAST TECHNIQUE: Multidetector CT imaging of the chest, abdomen and pelvis was performed following the standard protocol during bolus administration of intravenous contrast. CONTRAST:  126m ISOVUE-300 IOPAMIDOL (ISOVUE-300) INJECTION 61% COMPARISON:  Numerous prior studies.  The most recent is 07/25/2016 FINDINGS: CT CHEST FINDINGS Cardiovascular: The heart is normal in size. No pericardial effusion. Mild tortuosity and minimal calcification involving the thoracic aorta. Scattered coronary artery calcifications noted. Mediastinum/Nodes: No mediastinal or hilar mass or adenopathy. The esophagus is grossly normal. Lungs/Pleura: Stable emphysematous changes and areas of pulmonary scarring. Stable nodular density in the superior segment of the left lower lobe adjacent to the suture line. This measures a maximum of 13 x 10 mm. It most recently measured 15 x 10.5 mm. Musculoskeletal: No significant bony findings. No worrisome bone lesions. 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 CT ABDOMEN PELVIS FINDINGS Hepatobiliary: No focal hepatic lesions or intrahepatic biliary dilatation. The gallbladder is mildly contracted. No common bile duct dilatation. Pancreas: No mass, inflammation or ductal dilatation. Spleen: Normal size.  No focal lesions. Adrenals/Urinary Tract: The adrenal glands and kidneys are normal. No ureteral or bladder calculi. Stable small right renal cyst. Stomach/Bowel: The stomach, duodenum, and colon are unremarkable. No acute inflammatory or mass lesions. Vascular/Lymphatic: The aorta is normal in caliber. No dissection. Advanced atherosclerotic  calcifications, particularly at the bifurcation. The branch vessels are patent. The major venous structures are patent. No mesenteric or retroperitoneal mass or adenopathy. Small scattered lymph nodes are noted. Reproductive: The prostate gland and seminal vesicles are unremarkable. Other: No pelvic mass or adenopathy. No free pelvic fluid collections. No inguinal mass or adenopathy. Small periumbilical abdominal wall hernia containing fat. Musculoskeletal: No significant bony findings. IMPRESSION: 1. Stable postoperative scarring changes involving the left lung with a thick band of scar tissue near the suture line. 2. No mediastinal or hilar lymphadenopathy. 3. Stable emphysematous changes and pulmonary scarring. No acute pulmonary findings. 4. No findings for abdominal/pelvic metastatic disease or osseous metastatic disease. Electronically Signed   By: PMarijo SanesM.D.   On: 10/18/2016 14:32    ASSESSMENT AND PLAN:  This is a very pleasant 56years old white male with metastatic non-small cell lung cancer, adenocarcinoma with positive PDL 1 expression of 100%. He is currently undergoing treatment with Ketruda (pembrolizumab) 200 mg IV every 3 weeks, status post 31 cycles. He continues to tolerate this treatment well with no significant adverse effects. I recommended for the patient to proceed with cycle #32 today as a schedule. I will see him back for follow-up visit in 3 weeks for evaluation before starting cycle #33. He was advised to call immediately if he has any concerning symptoms in the interval. The patient voices understanding of current disease status and treatment options and is in agreement with the current care plan.  All questions were answered. The patient knows to call the clinic with any problems, questions or concerns. We can certainly see the patient much sooner if necessary.  Disclaimer: This note was dictated with voice recognition software. Similar sounding words can  inadvertently be transcribed and may not be corrected upon review.

## 2016-11-27 ENCOUNTER — Other Ambulatory Visit: Payer: Self-pay | Admitting: Radiation Therapy

## 2016-11-27 DIAGNOSIS — C7931 Secondary malignant neoplasm of brain: Secondary | ICD-10-CM

## 2016-11-27 DIAGNOSIS — C7949 Secondary malignant neoplasm of other parts of nervous system: Principal | ICD-10-CM

## 2016-11-28 ENCOUNTER — Other Ambulatory Visit: Payer: Self-pay | Admitting: *Deleted

## 2016-11-28 DIAGNOSIS — C3432 Malignant neoplasm of lower lobe, left bronchus or lung: Secondary | ICD-10-CM

## 2016-11-30 ENCOUNTER — Ambulatory Visit (HOSPITAL_BASED_OUTPATIENT_CLINIC_OR_DEPARTMENT_OTHER): Payer: BLUE CROSS/BLUE SHIELD | Admitting: Internal Medicine

## 2016-11-30 ENCOUNTER — Ambulatory Visit (HOSPITAL_BASED_OUTPATIENT_CLINIC_OR_DEPARTMENT_OTHER): Payer: BLUE CROSS/BLUE SHIELD

## 2016-11-30 ENCOUNTER — Other Ambulatory Visit (HOSPITAL_BASED_OUTPATIENT_CLINIC_OR_DEPARTMENT_OTHER): Payer: BLUE CROSS/BLUE SHIELD

## 2016-11-30 ENCOUNTER — Encounter: Payer: Self-pay | Admitting: Internal Medicine

## 2016-11-30 VITALS — BP 136/93 | HR 74 | Temp 97.7°F | Resp 20 | Ht 72.0 in | Wt 184.4 lb

## 2016-11-30 DIAGNOSIS — C7931 Secondary malignant neoplasm of brain: Secondary | ICD-10-CM

## 2016-11-30 DIAGNOSIS — C771 Secondary and unspecified malignant neoplasm of intrathoracic lymph nodes: Secondary | ICD-10-CM

## 2016-11-30 DIAGNOSIS — Z5112 Encounter for antineoplastic immunotherapy: Secondary | ICD-10-CM

## 2016-11-30 DIAGNOSIS — C3432 Malignant neoplasm of lower lobe, left bronchus or lung: Secondary | ICD-10-CM | POA: Diagnosis not present

## 2016-11-30 LAB — CBC WITH DIFFERENTIAL/PLATELET
BASO%: 0.9 % (ref 0.0–2.0)
Basophils Absolute: 0.1 10*3/uL (ref 0.0–0.1)
EOS%: 5.2 % (ref 0.0–7.0)
Eosinophils Absolute: 0.3 10*3/uL (ref 0.0–0.5)
HEMATOCRIT: 46 % (ref 38.4–49.9)
HEMOGLOBIN: 15.6 g/dL (ref 13.0–17.1)
LYMPH#: 2 10*3/uL (ref 0.9–3.3)
LYMPH%: 29.7 % (ref 14.0–49.0)
MCH: 32.6 pg (ref 27.2–33.4)
MCHC: 33.9 g/dL (ref 32.0–36.0)
MCV: 96 fL (ref 79.3–98.0)
MONO#: 0.8 10*3/uL (ref 0.1–0.9)
MONO%: 11.4 % (ref 0.0–14.0)
NEUT%: 52.8 % (ref 39.0–75.0)
NEUTROS ABS: 3.5 10*3/uL (ref 1.5–6.5)
PLATELETS: 264 10*3/uL (ref 140–400)
RBC: 4.79 10*6/uL (ref 4.20–5.82)
RDW: 13.2 % (ref 11.0–14.6)
WBC: 6.6 10*3/uL (ref 4.0–10.3)

## 2016-11-30 LAB — COMPREHENSIVE METABOLIC PANEL
ALBUMIN: 3.8 g/dL (ref 3.5–5.0)
ALK PHOS: 81 U/L (ref 40–150)
ALT: 25 U/L (ref 0–55)
AST: 28 U/L (ref 5–34)
Anion Gap: 9 mEq/L (ref 3–11)
BUN: 16.2 mg/dL (ref 7.0–26.0)
CO2: 19 meq/L — AB (ref 22–29)
Calcium: 9 mg/dL (ref 8.4–10.4)
Chloride: 109 mEq/L (ref 98–109)
Creatinine: 1 mg/dL (ref 0.7–1.3)
GLUCOSE: 94 mg/dL (ref 70–140)
POTASSIUM: 4.1 meq/L (ref 3.5–5.1)
Sodium: 137 mEq/L (ref 136–145)
Total Bilirubin: 0.46 mg/dL (ref 0.20–1.20)
Total Protein: 7.1 g/dL (ref 6.4–8.3)

## 2016-11-30 LAB — RESEARCH LABS

## 2016-11-30 MED ORDER — SODIUM CHLORIDE 0.9 % IV SOLN
200.0000 mg | Freq: Once | INTRAVENOUS | Status: AC
  Administered 2016-11-30: 200 mg via INTRAVENOUS
  Filled 2016-11-30: qty 8

## 2016-11-30 MED ORDER — SODIUM CHLORIDE 0.9 % IV SOLN
Freq: Once | INTRAVENOUS | Status: AC
  Administered 2016-11-30: 11:00:00 via INTRAVENOUS

## 2016-11-30 NOTE — Progress Notes (Signed)
[place] [phone]   [phone]  OFFICE PROGRESS NOTE  Briscoe Deutscher, MD [address] [place] [address]  DIAGNOSIS:  Stage IV (T2a, N1, M1b) non-small cell lung cancer, adenocarcinoma, negative EGFR mutation but equivocal EGFR amplification, negative ALK gene translocation and negative ROS 1 but with PDL-1 expression 100% presented with left lower lobe lung mass in addition to left hilar adenopathy and solitary metastatic brain lesion diagnosed in December 2016.  PRIOR THERAPY: 1) status post stereotactic radiotherapy and surgical resection diagnosed in December 2016. 2) status post video bronchoscopy with left VATS and wedge resection of the left upper lobe and left lower lobe superior segmentectomy with lymph node dissection under the care of Dr. Servando Snare on 01/14/2015.  CURRENT THERAPY: First-line treatment with immunotherapy with Ketruda (pembrolizumab) 200 mg IV every 3 weeks status post 32 cycles. First cycle was given 02/18/2015.  INTERVAL HISTORY: Timothy Obrien 56 y.o. male returns to the clinic today for follow-up visit.  The patient has no complaints today.  He continues to tolerate his immunotherapy with Beryle Flock fairly well.  He denied having any skin rash or diarrhea.  He denied having any chest pain, shortness of breath, cough or hemoptysis.  He has no significant weight loss or night sweats.  He denied having any fever or chills.  He is here today for evaluation before starting cycle #33.   MEDICAL HISTORY: Past Medical History:  Diagnosis Date  . Bronchitis   . Chronic fatigue 09/17/2015  . Depression 02/11/2016  . Hypertension 12/31/2015  . Lung cancer ([place])    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  . Pneumonia   . Seizures ([place])    last 12/11/14  . Shortness of breath dyspnea    with exertion  . Tobacco abuse     ALLERGIES:  has No Known Allergies.  MEDICATIONS:  Current Outpatient Medications    Medication Sig Dispense Refill  . ALPRAZolam (XANAX) 0.25 MG tablet Take 1 tablet by mouth daily as needed.    . mirtazapine (REMERON) 30 MG tablet Take 1 tablet (30 mg total) by mouth at bedtime. 90 tablet 0  . pentoxifylline (TRENTAL) 400 MG CR tablet Take 1 tablet (400 mg total) by mouth 2 (two) times daily after a meal. 60 tablet 5  . sodium chloride 0.9 % SOLN 50 mL with pembrolizumab 100 MG/4ML SOLN 200 mg Inject 200 mg into the vein every 21 ( twenty-one) days. 12 Dose 0  . vitamin E 400 UNIT capsule Take 400 Units by mouth daily.     No current facility-administered medications for this visit.     SURGICAL HISTORY:  Past Surgical History:  Procedure Laterality Date  . APPLICATION OF CRANIAL NAVIGATION N/A 12/26/2014   Procedure: APPLICATION OF CRANIAL NAVIGATION;  Surgeon: Kevan Ny Ditty, MD;  Location: [place] [facility];  Service: Neurosurgery;  Laterality: N/A;  . CRANIOTOMY N/A 12/26/2014   Procedure: CRANIOTOMY TUMOR EXCISION with BrainLab;  Surgeon: Kevan Ny Ditty, MD;  Location: [place] [facility];  Service: Neurosurgery;  Laterality: N/A;  CRANIOTOMY TUMOR EXCISION with Stealth  . HERNIA REPAIR    . VASECTOMY    . VIDEO ASSISTED THORACOSCOPY (VATS)/WEDGE RESECTION Left 01/14/2015   Procedure: VIDEO ASSISTED THORACOSCOPY (VATS)/WEDGE RESECTION, Superior segmentectomy left  lower lobe, wedge resection of left upper lobe, multiple lymph node disection, On Q insertion.;  Surgeon: Grace Isaac, MD;  Location: [place];  Service: Thoracic;  Laterality: Left;  Marland Kitchen VIDEO BRONCHOSCOPY Bilateral 12/16/2014   Procedure: VIDEO BRONCHOSCOPY WITH FLUORO;  Surgeon: Collene Gobble, MD;  Location: [place];  Service: Cardiopulmonary;  Laterality: Bilateral;  . VIDEO BRONCHOSCOPY N/A 01/14/2015   Procedure: VIDEO BRONCHOSCOPY;  Surgeon: Grace Isaac, MD;  Location: [facility] OR;  Service: Thoracic;  Laterality: N/A;    REVIEW OF SYSTEMS:  A comprehensive review of systems was negative.    PHYSICAL EXAMINATION: General appearance: alert, cooperative and no distress Head: Normocephalic, without obvious abnormality, atraumatic Neck: no adenopathy, no JVD, supple, symmetrical, trachea midline and thyroid not enlarged, symmetric, no tenderness/mass/nodules Lymph nodes: Cervical, supraclavicular, and axillary nodes normal. Resp: clear to auscultation bilaterally Back: symmetric, no curvature. ROM normal. No CVA tenderness. Cardio: regular rate and rhythm, S1, S2 normal, no murmur, click, rub or gallop GI: soft, non-tender; bowel sounds normal; no masses,  no organomegaly Extremities: extremities normal, atraumatic, no cyanosis or edema  ECOG PERFORMANCE STATUS: 0 - Asymptomatic  Blood pressure (!) 136/93, pulse 74, temperature 97.7 F (36.5 C), temperature source Oral, resp. rate 20, height 6' (1.829 m), weight 184 lb 6.4 oz (83.6 kg), SpO2 97 %.  LABORATORY DATA: Lab Results  Component Value Date   WBC 6.6 11/30/2016   HGB 15.6 11/30/2016   HCT 46.0 11/30/2016   MCV 96.0 11/30/2016   PLT 264 11/30/2016      Chemistry      Component Value Date/Time   NA 137 11/30/2016 0929   K 4.1 11/30/2016 0929   CL 109 01/16/2015 0450   CO2 19 (L) 11/30/2016 0929   BUN 16.2 11/30/2016 0929   CREATININE 1.0 11/30/2016 0929      Component Value Date/Time   CALCIUM 9.0 11/30/2016 0929   ALKPHOS 81 11/30/2016 0929   AST 28 11/30/2016 0929   ALT 25 11/30/2016 0929   BILITOT 0.46 11/30/2016 0929       RADIOGRAPHIC STUDIES: No results found.  ASSESSMENT AND PLAN:  This is a very pleasant 56 years old white male with metastatic non-small cell lung cancer, adenocarcinoma with positive PDL 1 expression of 100%. He is currently undergoing treatment with Ketruda (pembrolizumab) 200 mg IV every 3 weeks, status post 32 cycles. He tolerated the last cycle of his treatment very well with no specific complaints. I recommended for the patient to proceed with cycle #33 today as a  scheduled. The patient will come back for follow-up visit in 3 weeks for evaluation before the next cycle of his treatment. He was advised to call immediately if he has any concerning symptoms in the interval. The patient voices understanding of current disease status and treatment options and is in agreement with the current care plan. All questions were answered. The patient knows to call the clinic with any problems, questions or concerns. We can certainly see the patient much sooner if necessary.  Disclaimer: This note was dictated with voice recognition software. Similar sounding words can inadvertently be transcribed and may not be corrected upon review.

## 2016-11-30 NOTE — Patient Instructions (Signed)
[facility] Discharge Instructions for Patients Receiving Chemotherapy  Today you received the following chemotherapy agents:  Keytruda.  To help prevent nausea and vomiting after your treatment, we encourage you to take your nausea medication as directed.   If you develop nausea and vomiting that is not controlled by your nausea medication, call the clinic.   BELOW ARE SYMPTOMS THAT SHOULD BE REPORTED IMMEDIATELY:  *FEVER GREATER THAN 100.5 F  *CHILLS WITH OR WITHOUT FEVER  NAUSEA AND VOMITING THAT IS NOT CONTROLLED WITH YOUR NAUSEA MEDICATION  *UNUSUAL SHORTNESS OF BREATH  *UNUSUAL BRUISING OR BLEEDING  TENDERNESS IN MOUTH AND THROAT WITH OR WITHOUT PRESENCE OF ULCERS  *URINARY PROBLEMS  *BOWEL PROBLEMS  UNUSUAL RASH Items with * indicate a potential emergency and should be followed up as soon as possible.  Feel free to call the clinic should you have any questions or concerns. The clinic phone number is [phone].  Please show the CHEMO ALERT CARD at check-in to the Emergency Department and triage nurse.    

## 2016-12-20 NOTE — Progress Notes (Signed)
Timothy Obrien. Timothy Obrien 56 y.o. man with a left frontal brain metastasis from non-small cell lung cancer of the left lower lobe of the lung radiation completed 12-25-14 review 12-23-16 MRI brain w wo contrast, FU.  Headache:No Pain:No Dizziness:No Nausea/Vomiting/Ataxia:No Visional changes(Blurred/Diplopia (double vision), blind spots and peripheral vision changes):Wears glasses for reading,  Ring in ears:No Fatigue:No Cognitive changes:Alert and oriented x3. Denies having memory issues. Weight: Wt Readings from Last 3 Encounters:  12/27/16 187 lb 6.4 oz (85 kg)  12/22/16 186 lb 6.4 oz (84.6 kg)  11/30/16 184 lb 6.4 oz (83.6 kg)    Appetite:Good 12-22-16 Saw Dr. Julien Nordmann First-line treatment with immunotherapy with [name] (pembrolizumab) 200 mg IV every 3 weeks  BP (!) 144/95 (BP Location: Right Arm, Patient Position: Sitting, Cuff Size: Normal)   Pulse 85   Temp (!) 97.5 F (36.4 C) (Oral)   Resp 18   Ht (!) 6" (0.152 m)   Wt 187 lb 6.4 oz (85 kg)   SpO2 95%   BMI 3659.91 kg/m

## 2016-12-22 ENCOUNTER — Encounter: Payer: Self-pay | Admitting: Internal Medicine

## 2016-12-22 ENCOUNTER — Other Ambulatory Visit (HOSPITAL_BASED_OUTPATIENT_CLINIC_OR_DEPARTMENT_OTHER): Payer: BLUE CROSS/BLUE SHIELD

## 2016-12-22 ENCOUNTER — Telehealth: Payer: Self-pay | Admitting: Internal Medicine

## 2016-12-22 ENCOUNTER — Ambulatory Visit (HOSPITAL_BASED_OUTPATIENT_CLINIC_OR_DEPARTMENT_OTHER): Payer: BLUE CROSS/BLUE SHIELD | Admitting: Internal Medicine

## 2016-12-22 ENCOUNTER — Ambulatory Visit (HOSPITAL_BASED_OUTPATIENT_CLINIC_OR_DEPARTMENT_OTHER): Payer: BLUE CROSS/BLUE SHIELD

## 2016-12-22 VITALS — BP 146/99 | HR 75 | Temp 97.7°F | Resp 18 | Ht 72.0 in | Wt 186.4 lb

## 2016-12-22 DIAGNOSIS — C7931 Secondary malignant neoplasm of brain: Secondary | ICD-10-CM

## 2016-12-22 DIAGNOSIS — C771 Secondary and unspecified malignant neoplasm of intrathoracic lymph nodes: Secondary | ICD-10-CM

## 2016-12-22 DIAGNOSIS — C3432 Malignant neoplasm of lower lobe, left bronchus or lung: Secondary | ICD-10-CM

## 2016-12-22 DIAGNOSIS — C3431 Malignant neoplasm of lower lobe, right bronchus or lung: Secondary | ICD-10-CM

## 2016-12-22 DIAGNOSIS — Z5112 Encounter for antineoplastic immunotherapy: Secondary | ICD-10-CM

## 2016-12-22 LAB — COMPREHENSIVE METABOLIC PANEL
ALT: 19 U/L (ref 0–55)
AST: 25 U/L (ref 5–34)
Albumin: 3.8 g/dL (ref 3.5–5.0)
Alkaline Phosphatase: 86 U/L (ref 40–150)
Anion Gap: 11 mEq/L (ref 3–11)
BILIRUBIN TOTAL: 0.47 mg/dL (ref 0.20–1.20)
BUN: 15 mg/dL (ref 7.0–26.0)
CHLORIDE: 109 meq/L (ref 98–109)
CO2: 16 meq/L — AB (ref 22–29)
Calcium: 8.5 mg/dL (ref 8.4–10.4)
Creatinine: 1 mg/dL (ref 0.7–1.3)
GLUCOSE: 94 mg/dL (ref 70–140)
POTASSIUM: 4.3 meq/L (ref 3.5–5.1)
SODIUM: 136 meq/L (ref 136–145)
TOTAL PROTEIN: 6.9 g/dL (ref 6.4–8.3)

## 2016-12-22 LAB — CBC WITH DIFFERENTIAL/PLATELET
BASO%: 1.1 % (ref 0.0–2.0)
BASOS ABS: 0.1 10*3/uL (ref 0.0–0.1)
EOS ABS: 0.4 10*3/uL (ref 0.0–0.5)
EOS%: 6.2 % (ref 0.0–7.0)
HEMATOCRIT: 45.2 % (ref 38.4–49.9)
HGB: 15.2 g/dL (ref 13.0–17.1)
LYMPH#: 1.8 10*3/uL (ref 0.9–3.3)
LYMPH%: 29.2 % (ref 14.0–49.0)
MCH: 32.2 pg (ref 27.2–33.4)
MCHC: 33.6 g/dL (ref 32.0–36.0)
MCV: 95.8 fL (ref 79.3–98.0)
MONO#: 0.7 10*3/uL (ref 0.1–0.9)
MONO%: 11.2 % (ref 0.0–14.0)
NEUT#: 3.3 10*3/uL (ref 1.5–6.5)
NEUT%: 52.3 % (ref 39.0–75.0)
PLATELETS: 251 10*3/uL (ref 140–400)
RBC: 4.72 10*6/uL (ref 4.20–5.82)
RDW: 13 % (ref 11.0–14.6)
WBC: 6.3 10*3/uL (ref 4.0–10.3)

## 2016-12-22 MED ORDER — SODIUM CHLORIDE 0.9 % IV SOLN
200.0000 mg | Freq: Once | INTRAVENOUS | Status: AC
  Administered 2016-12-22: 200 mg via INTRAVENOUS
  Filled 2016-12-22: qty 8

## 2016-12-22 MED ORDER — SODIUM CHLORIDE 0.9 % IV SOLN
Freq: Once | INTRAVENOUS | Status: AC
  Administered 2016-12-22: 09:00:00 via INTRAVENOUS

## 2016-12-22 NOTE — Patient Instructions (Signed)
[facility] Discharge Instructions for Patients Receiving Chemotherapy  Today you received the following chemotherapy agents:  Keytruda.  To help prevent nausea and vomiting after your treatment, we encourage you to take your nausea medication as directed.   If you develop nausea and vomiting that is not controlled by your nausea medication, call the clinic.   BELOW ARE SYMPTOMS THAT SHOULD BE REPORTED IMMEDIATELY:  *FEVER GREATER THAN 100.5 F  *CHILLS WITH OR WITHOUT FEVER  NAUSEA AND VOMITING THAT IS NOT CONTROLLED WITH YOUR NAUSEA MEDICATION  *UNUSUAL SHORTNESS OF BREATH  *UNUSUAL BRUISING OR BLEEDING  TENDERNESS IN MOUTH AND THROAT WITH OR WITHOUT PRESENCE OF ULCERS  *URINARY PROBLEMS  *BOWEL PROBLEMS  UNUSUAL RASH Items with * indicate a potential emergency and should be followed up as soon as possible.  Feel free to call the clinic should you have any questions or concerns. The clinic phone number is [phone].  Please show the CHEMO ALERT CARD at check-in to the Emergency Department and triage nurse.    

## 2016-12-22 NOTE — Telephone Encounter (Signed)
Per los already scheduled

## 2016-12-22 NOTE — Progress Notes (Signed)
Timothy Obrien [phone]   [phone]  OFFICE PROGRESS NOTE  Timothy Deutscher, MD [address] [place] [address]  DIAGNOSIS:  Stage IV (T2a, N1, M1b) non-small cell lung cancer, adenocarcinoma, negative EGFR mutation but equivocal EGFR amplification, negative ALK gene translocation and negative ROS 1 but with PDL-1 expression 100% presented with left lower lobe lung mass in addition to left hilar adenopathy and solitary metastatic brain lesion diagnosed in December 2016.  PRIOR THERAPY: 1) status post stereotactic radiotherapy and surgical resection diagnosed in December 2016. 2) status post video bronchoscopy with left VATS and wedge resection of the left upper lobe and left lower lobe superior segmentectomy with lymph node dissection under the care of Dr. Servando Obrien on 01/14/2015.  CURRENT THERAPY: First-line treatment with immunotherapy with Timothy (pembrolizumab) 200 mg IV every 3 weeks status post 33 cycles. First cycle was given 02/18/2015.  INTERVAL HISTORY: Timothy Obrien 56 y.o. male returns to the clinic today for follow-up visit.  The patient is feeling fine today with no specific complaints.  He denied having any chest pain, shortness of breath, cough or hemoptysis.  He denied having any fever or chills.  He has no nausea, vomiting, diarrhea or constipation.  The patient is here today for evaluation before starting cycle #34 of his treatment.   MEDICAL HISTORY: Past Medical History:  Diagnosis Date  . Bronchitis   . Chronic fatigue 09/17/2015  . Depression 02/11/2016  . Hypertension 12/31/2015  . Lung cancer (Timothy Obrien)    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  . Pneumonia   . Seizures ([place])    last 12/11/14  . Shortness of breath dyspnea    with exertion  . Tobacco abuse     ALLERGIES:  has No Known Allergies.  MEDICATIONS:  Current Outpatient Medications  Medication Sig Dispense Refill  . ALPRAZolam (XANAX) 0.25 MG tablet  Take 1 tablet by mouth daily as needed.    . mirtazapine (REMERON) 30 MG tablet Take 1 tablet (30 mg total) by mouth at bedtime. 90 tablet 0  . pentoxifylline (TRENTAL) 400 MG CR tablet Take 1 tablet (400 mg total) by mouth 2 (two) times daily after a meal. 60 tablet 5  . sodium chloride 0.9 % SOLN 50 mL with pembrolizumab 100 MG/4ML SOLN 200 mg Inject 200 mg into the vein every 21 ( twenty-one) days. 12 Dose 0  . vitamin E 400 UNIT capsule Take 400 Units by mouth daily.     No current facility-administered medications for this visit.     SURGICAL HISTORY:  Past Surgical History:  Procedure Laterality Date  . APPLICATION OF CRANIAL NAVIGATION N/A 12/26/2014   Procedure: APPLICATION OF CRANIAL NAVIGATION;  Surgeon: Timothy Ny Ditty, MD;  Location: [facility];  Service: Neurosurgery;  Laterality: N/A;  . CRANIOTOMY N/A 12/26/2014   Procedure: CRANIOTOMY TUMOR EXCISION with BrainLab;  Surgeon: Timothy Ny Ditty, MD;  Location: [facility];  Service: Neurosurgery;  Laterality: N/A;  CRANIOTOMY TUMOR EXCISION with Stealth  . HERNIA REPAIR    . VASECTOMY    . VIDEO ASSISTED THORACOSCOPY (VATS)/WEDGE RESECTION Left 01/14/2015   Procedure: VIDEO ASSISTED THORACOSCOPY (VATS)/WEDGE RESECTION, Superior segmentectomy left  lower lobe, wedge resection of left upper lobe, multiple lymph node disection, On Q insertion.;  Surgeon: Timothy Isaac, MD;  Location: [facility];  Service: Thoracic;  Laterality: Left;  Marland Kitchen VIDEO BRONCHOSCOPY Bilateral 12/16/2014   Procedure: VIDEO BRONCHOSCOPY WITH  FLUORO;  Surgeon: Timothy Gobble, MD;  Location: [facility];  Service: Cardiopulmonary;  Laterality: Bilateral;  . VIDEO BRONCHOSCOPY N/A 01/14/2015   Procedure: VIDEO BRONCHOSCOPY;  Surgeon: Timothy Isaac, MD;  Location: [facility] OR;  Service: Thoracic;  Laterality: N/A;    REVIEW OF SYSTEMS:  A comprehensive review of systems was negative.   PHYSICAL EXAMINATION: General appearance: alert, cooperative and no  distress Head: Normocephalic, without obvious abnormality, atraumatic Neck: no adenopathy, no JVD, supple, symmetrical, trachea midline and thyroid not enlarged, symmetric, no tenderness/mass/nodules Lymph nodes: Cervical, supraclavicular, and axillary nodes normal. Resp: clear to auscultation bilaterally Back: symmetric, no curvature. ROM normal. No CVA tenderness. Cardio: regular rate and rhythm, S1, S2 normal, no murmur, click, rub or gallop GI: soft, non-tender; bowel sounds normal; no masses,  no organomegaly Extremities: extremities normal, atraumatic, no cyanosis or edema  ECOG PERFORMANCE STATUS: 0 - Asymptomatic  Blood pressure (!) 146/99, pulse 75, temperature 97.7 F (36.5 C), temperature source Oral, resp. rate 18, height 6' (1.829 m), weight 186 lb 6.4 oz (84.6 kg), SpO2 98 %.  LABORATORY DATA: Lab Results  Component Value Date   WBC 6.3 12/22/2016   HGB 15.2 12/22/2016   HCT 45.2 12/22/2016   MCV 95.8 12/22/2016   PLT 251 12/22/2016      Chemistry      Component Value Date/Time   NA 137 11/30/2016 0929   K 4.1 11/30/2016 0929   CL 109 01/16/2015 0450   CO2 19 (L) 11/30/2016 0929   BUN 16.2 11/30/2016 0929   CREATININE 1.0 11/30/2016 0929      Component Value Date/Time   CALCIUM 9.0 11/30/2016 0929   ALKPHOS 81 11/30/2016 0929   AST 28 11/30/2016 0929   ALT 25 11/30/2016 0929   BILITOT 0.46 11/30/2016 0929       RADIOGRAPHIC STUDIES: No results found.  ASSESSMENT AND PLAN:  This is a very pleasant 56 years old white male with metastatic non-small cell lung cancer, adenocarcinoma with positive PDL 1 expression of 100%. He is currently undergoing treatment with Timothy (pembrolizumab) 200 mg IV every 3 weeks, status post 33 cycles. He continues to tolerate this treatment fairly well with no significant adverse effects. He would proceed with cycle #34 today as a scheduled. I would see the patient back for follow-up visit in 3 weeks for evaluation before  starting cycle #35. The patient was advised to call immediately if he has any concerning symptoms in the interval. The patient voices understanding of current disease status and treatment options and is in agreement with the current care plan. All questions were answered. The patient knows to call the clinic with any problems, questions or concerns. We can certainly see the patient much sooner if necessary.  Disclaimer: This note was dictated with voice recognition software. Similar sounding words can inadvertently be transcribed and may not be corrected upon review.

## 2016-12-23 ENCOUNTER — Ambulatory Visit
Admission: RE | Admit: 2016-12-23 | Discharge: 2016-12-23 | Disposition: A | Discharge status: Home / Self Care | Payer: BLUE CROSS/BLUE SHIELD | Source: Ambulatory Visit | Attending: Radiation Oncology | Admitting: Radiation Oncology

## 2016-12-23 DIAGNOSIS — C7931 Secondary malignant neoplasm of brain: Secondary | ICD-10-CM

## 2016-12-23 DIAGNOSIS — C7949 Secondary malignant neoplasm of other parts of nervous system: Principal | ICD-10-CM

## 2016-12-23 MED ORDER — GADOBENATE DIMEGLUMINE 529 MG/ML IV SOLN
18.0000 mL | Freq: Once | INTRAVENOUS | Status: AC | PRN
  Administered 2016-12-23: 18 mL via INTRAVENOUS

## 2016-12-27 ENCOUNTER — Encounter: Payer: Self-pay | Admitting: Urology

## 2016-12-27 ENCOUNTER — Ambulatory Visit
Admission: RE | Admit: 2016-12-27 | Discharge: 2016-12-27 | Disposition: A | Discharge status: Home / Self Care | Payer: BLUE CROSS/BLUE SHIELD | Source: Ambulatory Visit | Attending: Urology | Admitting: Urology

## 2016-12-27 ENCOUNTER — Other Ambulatory Visit: Payer: Self-pay

## 2016-12-27 VITALS — BP 144/95 | HR 85 | Temp 97.5°F | Resp 18 | Ht <= 58 in | Wt 187.4 lb

## 2016-12-27 DIAGNOSIS — Z87891 Personal history of nicotine dependence: Secondary | ICD-10-CM | POA: Insufficient documentation

## 2016-12-27 DIAGNOSIS — F329 Major depressive disorder, single episode, unspecified: Secondary | ICD-10-CM | POA: Diagnosis not present

## 2016-12-27 DIAGNOSIS — C7931 Secondary malignant neoplasm of brain: Secondary | ICD-10-CM | POA: Insufficient documentation

## 2016-12-27 DIAGNOSIS — Z85118 Personal history of other malignant neoplasm of bronchus and lung: Secondary | ICD-10-CM | POA: Diagnosis not present

## 2016-12-27 DIAGNOSIS — Z9889 Other specified postprocedural states: Secondary | ICD-10-CM | POA: Diagnosis not present

## 2016-12-27 DIAGNOSIS — I10 Essential (primary) hypertension: Secondary | ICD-10-CM | POA: Insufficient documentation

## 2016-12-27 NOTE — Progress Notes (Signed)
Radiation Oncology         [phone] ________________________________  Name: Timothy Obrien MRN: [id number]  Date: 12/27/2016  DOB: 16-Jul-1960  Follow-Up Visit Note  CC: Briscoe Deutscher, MD  Ditty, Kevan Ny, *  Diagnosis:  56 y.o.  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 Brain metastasis ([place]) C79.31     Interval Since Last Radiation:  2 years  12/25/2014 Preop SRS Treatment: Left frontal 36 mm target was treated using 4 Dynamic Conformal Arcs to a prescription dose of 16 Gy.  ExacTrac registration was performed for each couch angle.    Narrative:  Timothy Obrien is a pleasant 56 y.o. 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 Julien Nordmann.  He was treated with preoperative SRS to a solitary brain metastasis in 12/2014, followed by surgical resection. He continues in surveillance and his disease has been stable on brain imaging. MRI imaging on 07/21/16 showed some progressive enhancement but stable size and no new lesions. 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  Repeat MRI on 09/15/16 showed decreased size of left frontoparietal lesion with similar surrounding edema suggesting interval response to therapy and there were no new lesions. His last systemic imaging on 10/18/16 showed disease stability without evidence of recurrent or progressive disease in the left lung and no evidence of abdominal/pelvic metastatic disease or osseous metastatic disease.  He recently saw Dr. Julien Nordmann on 12/22/2016 and the plan was to complete his last cycle of Keytruda  immunotherapy in January 2019 with repeat systemic imaging for disease restaging thereafter.  Interval History: He has continued on Keytruda as first line immunotherapy and has received a total of 33 cycles to date.  He presents today to review his recent brain MRI performed on 12/23/16 which shows continued progression of enhancement and edema in the treated left frontal lobe metastasis, currently measuring 16 x 14 mm as compared to 13 x 11 mm previously but no new lesions detected.  His case was discussed at recent multidisciplinary brain conference and recommendation is to continue Trental and Vitamin  E, with repeat brain MRI in 3 months.  On review of systems, the patient reports that he is doing well overall. He denies any chest pain, shortness of breath, cough, fevers, chills, night sweats, unintended weight changes. He denies abdominal pain, nausea, vomiting diarrhea or constipation. He denies headaches, visual or auditory disturbances, imbalance, weakness, seizures, or tremor. He has noticed that he is dragging his right foot slightly and is not sure if this is changed or if he is just noticing this and paying more attention to it recently. This is certainly not progressively worsening since he noticed it 6 weeks or so ago.  He denies any other new systemic symptoms and specifically denies numbness or tingling in the lower extremities or bowel or bladder dysfunction. He denies any new musculoskeletal or joint aches or pains, new skin lesions or concerns. A complete review of systems is obtained and is otherwise negative.   Past Medical History:  Past Medical History:  Diagnosis Date  . Bronchitis   . Chronic fatigue 09/17/2015  . Depression 02/11/2016  . Hypertension 12/31/2015  . Lung cancer ([place])    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  . Pneumonia   . Seizures ([place])    last 12/11/14  . Shortness of breath dyspnea    with exertion  . Tobacco abuse     Past Surgical History: Past  Surgical History:  Procedure Laterality Date  . APPLICATION OF CRANIAL NAVIGATION N/A 12/26/2014   Procedure: APPLICATION OF CRANIAL NAVIGATION;  Surgeon: Kevan Ny Ditty, MD;  Location: [facility];  Service: Neurosurgery;  Laterality: N/A;  . CRANIOTOMY N/A 12/26/2014   Procedure: CRANIOTOMY TUMOR EXCISION with BrainLab;  Surgeon: Kevan Ny Ditty, MD;  Location: [facility];  Service: Neurosurgery;  Laterality: N/A;  CRANIOTOMY TUMOR EXCISION with Stealth  . HERNIA REPAIR    . VASECTOMY    . VIDEO ASSISTED THORACOSCOPY (VATS)/WEDGE RESECTION Left 01/14/2015   Procedure: VIDEO ASSISTED THORACOSCOPY (VATS)/WEDGE RESECTION, Superior segmentectomy left  lower lobe, wedge resection of left upper lobe, multiple lymph node disection, On Q insertion.;  Surgeon: Grace Isaac, MD;  Location: [facility];  Service: Thoracic;  Laterality: Left;  Marland Kitchen VIDEO BRONCHOSCOPY Bilateral 12/16/2014   Procedure: VIDEO BRONCHOSCOPY WITH FLUORO;  Surgeon: Collene Gobble, MD;  Location: [facility];  Service: Cardiopulmonary;  Laterality: Bilateral;  . VIDEO BRONCHOSCOPY N/A 01/14/2015   Procedure: VIDEO BRONCHOSCOPY;  Surgeon: Grace Isaac, MD;  Location: [facility];  Service: Thoracic;  Laterality: N/A;    Social History:  Social History   Socioeconomic History  . Marital status: Divorced    Spouse name: Not on file  . Number of children: Not on file  . Years of education: Not on file  . Highest education level: Not on file  Social Needs  . Financial resource strain: Not on file  . Food insecurity - worry: Not on file  . Food insecurity - inability: Not on file  . Transportation needs - medical: Not on file  . Transportation needs - non-medical: Not on file  Occupational History  . Not on file  Tobacco Use  . Smoking status: Former Smoker    Packs/day: 1.00    Years: 36.00    Pack years: 36.00    Types: Cigarettes    Start date: 12/14/1979    Last attempt to quit: 10/07/2015    Years since  quitting: 1.2  . Smokeless tobacco: Current User  . Tobacco comment: Vapes: Few cigars a day. Quit cigarettes after easter.  Substance and Sexual Activity  . Alcohol use: Yes    Alcohol/week: 21.0 oz    Types: 35 Cans of beer per week    Comment: daily 6 beers a day for the past fefw years.   . Drug use: Yes    Types: Marijuana    Comment: last time 01/04/15  . Sexual activity: Not Currently  Other Topics Concern  . 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 He does have a cat but no other home pets. No mold exposure. Recent travel to [place] but with symptoms at the onset of travel.    Family History: Family History  Problem Relation Age of Onset  . Breast cancer Mother   . Colon polyps Mother   . Arthritis Father      ALLERGIES:  has No Known Allergies.  Meds: Current Outpatient Medications  Medication Sig Dispense Refill  . mirtazapine (REMERON) 30 MG tablet Take 1 tablet (30 mg total) by mouth at bedtime. 90 tablet 0  . pentoxifylline (TRENTAL) 400 MG CR tablet Take 1 tablet (400 mg total) by mouth 2 (two) times daily after a meal. 60 tablet 5  . sodium chloride 0.9 % SOLN 50 mL with pembrolizumab 100 MG/4ML SOLN 200 mg Inject 200 mg into the vein every 21 ( twenty-one) days. 12 Dose 0  . vitamin E 400 UNIT capsule Take 400 Units by mouth daily.    Marland Kitchen ALPRAZolam (XANAX) 0.25 MG tablet Take 1 tablet by mouth daily as needed.     No current facility-administered medications for this encounter.     Physical Findings: Wt Readings from Last 3 Encounters:  12/27/16 187 lb 6.4 oz (85 kg)  12/22/16 186 lb 6.4 oz (84.6 kg)  11/30/16 184 lb 6.4 oz (83.6 kg)   Temp Readings from Last 3 Encounters:  12/27/16 (!) 97.5 F (36.4 C) (Oral)  12/22/16 97.7 F (36.5 C) (Oral)  11/30/16 97.7 F (36.5 C) (Oral)   BP Readings from Last 3 Encounters:  12/27/16 (!) 144/95  12/22/16 (!) 146/99  11/30/16  (!) 136/93   Pulse Readings from Last 3 Encounters:  12/27/16 85  12/22/16 75  11/30/16 74  Pain Assessment Pain Score: 0-No pain/10  In general this is a well appearing caucasian male in no acute distress. He's alert and oriented x4 and appropriate throughout the examination. Cardiopulmonary assessment is negative for acute distress and he exhibits normal effort. He appears grossly intact neurologically. Sensation is intact to light touch and strength is 5/5 and equal bilaterally in the lower extremities.  He has a normal gait.   Lab Findings: Lab Results  Component Value Date   WBC 6.3 12/22/2016   WBC 8.8 01/16/2015   HGB 15.2 12/22/2016   HCT 45.2 12/22/2016   PLT 251 12/22/2016    Lab Results  Component Value Date   NA 136 12/22/2016   K 4.3 12/22/2016   CHLORIDE 109 12/22/2016   CO2 16 (L) 12/22/2016   GLUCOSE 94 12/22/2016   BUN 15.0 12/22/2016   CREATININE 1.0 12/22/2016   BILITOT 0.47 12/22/2016   ALKPHOS 86 12/22/2016   AST 25 12/22/2016   ALT 19 12/22/2016   PROT 6.9 12/22/2016   ALBUMIN 3.8 12/22/2016   CALCIUM 8.5 12/22/2016   ANIONGAP 11 12/22/2016   ANIONGAP 7 01/16/2015    Radiographic Findings: Mr [name] PZ Contrast  Result Date: 12/23/2016 CLINICAL DATA:  Follow-up treated metastatic lung cancer to the brain. Patient is status post SRS to a solitary left frontal brain metastasis December 2016. EXAM: MRI HEAD WITHOUT AND WITH CONTRAST TECHNIQUE: Multiplanar, multiecho pulse sequences of the brain and surrounding structures were obtained without and with intravenous contrast. CONTRAST:  19m MULTIHANCE GADOBENATE DIMEGLUMINE 529 MG/ML IV SOLN COMPARISON:  09/15/2016 FINDINGS: Brain: Treated left frontal metastasis with gradually progressive enhancement during 2018. Enhancing area currently measures 16 x 14 mm as compared to 13 x 11 mm previously. Most of the progression appears to be along the medial and superior aspect of the lesion. The area of  enhancement is centrally dark from blood products. This limits detection of dense cellular tumor by T2 and diffusion. Perfusion imaging would also be difficult due to potential susceptibility effects. There is mild vasogenic edema which is mildly progressed. No new lesion is seen. No infarct, hemorrhage, hydrocephalus, or shift Vascular: Major flow voids and  vascular enhancements are preserved. Skull and upper cervical spine: No visible marrow lesion. Left frontal craniotomy is unremarkable. Sinuses/Orbits: Mucous retention cysts in the floor of the right maxillary sinus. IMPRESSION: 1. Treated left frontal lobe metastasis with continued progression of enhancement and edema. The enhancing area currently measures 16 x 14 mm as compared to 13 x 11 mm previously. Remote blood products are superimposed on the enhancing area, which would significantly complicate DSC perfusion assessment. 2. No new lesion. Electronically Signed   By: Monte Fantasia M.D.   On: 12/23/2016 13:31    Impression/Plan:  1. 56 y.o. gentleman with Stage IV, T2a, N1, M1b NSCLC, adenocarcinoma of the left lower lobe with brain metastases.  The patient's recent MRI shows continued progression of enhancement and edema in the treated left frontal lobe metastasis, currently measuring 16 x 14 mm as compared to 13 x 11 mm previously but no new lesions detected.  The consensus recommendation from [facility] brain conference is to continue Trental 444m po BID and Vitamin E 400 IU BID with plans to repeat his imaging in 3 months' time for further evaluation. He is interested in potentially increasing the interval between MRI scans so I advised that we would [facility] and I will let him know the recommendation at the time of his next follow-up. Regarding his systemic treatment, he will continue under the care and direction of Dr. MJulien Nordmann Currently, the plan is to complete his last cycle of Keytruda immunotherapy in  January 2019 with repeat systemic imaging thereafter for disease restaging. He appears to have a good understanding of this plan and is in agreement.  He knows to inform [place]mediately of any changes in his symptomatology and will contact [place]ore[place]ith questions or concerns in the interim.    ANicholos Johns PA-C

## 2016-12-30 ENCOUNTER — Other Ambulatory Visit: Payer: Self-pay | Admitting: Urology

## 2016-12-30 DIAGNOSIS — C7931 Secondary malignant neoplasm of brain: Secondary | ICD-10-CM

## 2016-12-30 MED ORDER — PENTOXIFYLLINE ER 400 MG PO TBCR: 400.0000 mg | EXTENDED_RELEASE_TABLET | Freq: Two times a day (BID) | ORAL | 5 refills | Status: DC

## 2017-01-12 ENCOUNTER — Other Ambulatory Visit (HOSPITAL_BASED_OUTPATIENT_CLINIC_OR_DEPARTMENT_OTHER): Payer: BLUE CROSS/BLUE SHIELD

## 2017-01-12 ENCOUNTER — Telehealth: Payer: Self-pay | Admitting: Internal Medicine

## 2017-01-12 ENCOUNTER — Encounter: Payer: Self-pay | Admitting: Internal Medicine

## 2017-01-12 ENCOUNTER — Ambulatory Visit (HOSPITAL_BASED_OUTPATIENT_CLINIC_OR_DEPARTMENT_OTHER): Payer: BLUE CROSS/BLUE SHIELD

## 2017-01-12 ENCOUNTER — Ambulatory Visit (HOSPITAL_BASED_OUTPATIENT_CLINIC_OR_DEPARTMENT_OTHER): Payer: BLUE CROSS/BLUE SHIELD | Admitting: Internal Medicine

## 2017-01-12 VITALS — BP 135/88 | HR 72 | Temp 97.8°F | Resp 18 | Ht 72.0 in | Wt 187.0 lb

## 2017-01-12 DIAGNOSIS — C7931 Secondary malignant neoplasm of brain: Secondary | ICD-10-CM

## 2017-01-12 DIAGNOSIS — C3490 Malignant neoplasm of unspecified part of unspecified bronchus or lung: Secondary | ICD-10-CM

## 2017-01-12 DIAGNOSIS — C3432 Malignant neoplasm of lower lobe, left bronchus or lung: Secondary | ICD-10-CM

## 2017-01-12 DIAGNOSIS — R531 Weakness: Secondary | ICD-10-CM | POA: Diagnosis not present

## 2017-01-12 DIAGNOSIS — Z5112 Encounter for antineoplastic immunotherapy: Secondary | ICD-10-CM

## 2017-01-12 DIAGNOSIS — R29898 Other symptoms and signs involving the musculoskeletal system: Secondary | ICD-10-CM | POA: Insufficient documentation

## 2017-01-12 LAB — CBC WITH DIFFERENTIAL/PLATELET
BASO%: 0.9 % (ref 0.0–2.0)
Basophils Absolute: 0.1 10*3/uL (ref 0.0–0.1)
EOS%: 3.7 % (ref 0.0–7.0)
Eosinophils Absolute: 0.3 10*3/uL (ref 0.0–0.5)
HCT: 47.6 % (ref 38.4–49.9)
HGB: 15.9 g/dL (ref 13.0–17.1)
LYMPH%: 27.1 % (ref 14.0–49.0)
MCH: 31.8 pg (ref 27.2–33.4)
MCHC: 33.4 g/dL (ref 32.0–36.0)
MCV: 95.2 fL (ref 79.3–98.0)
MONO#: 0.7 10*3/uL (ref 0.1–0.9)
MONO%: 10.3 % (ref 0.0–14.0)
NEUT#: 4.1 10*3/uL (ref 1.5–6.5)
NEUT%: 58 % (ref 39.0–75.0)
PLATELETS: 267 10*3/uL (ref 140–400)
RBC: 5 10*6/uL (ref 4.20–5.82)
RDW: 13.2 % (ref 11.0–14.6)
WBC: 7 10*3/uL (ref 4.0–10.3)
lymph#: 1.9 10*3/uL (ref 0.9–3.3)

## 2017-01-12 LAB — COMPREHENSIVE METABOLIC PANEL
ALBUMIN: 3.9 g/dL (ref 3.5–5.0)
ALK PHOS: 99 U/L (ref 40–150)
ALT: 22 U/L (ref 0–55)
AST: 23 U/L (ref 5–34)
Anion Gap: 10 mEq/L (ref 3–11)
BILIRUBIN TOTAL: 0.41 mg/dL (ref 0.20–1.20)
BUN: 15.3 mg/dL (ref 7.0–26.0)
CALCIUM: 9 mg/dL (ref 8.4–10.4)
CO2: 18 mEq/L — ABNORMAL LOW (ref 22–29)
Chloride: 109 mEq/L (ref 98–109)
Creatinine: 1 mg/dL (ref 0.7–1.3)
EGFR: >60 mL/min/{1.73_m2} (ref >60)
Glucose: 91 mg/dl (ref 70–140)
Potassium: 4.4 mEq/L (ref 3.5–5.1)
Sodium: 137 mEq/L (ref 136–145)
TOTAL PROTEIN: 7.2 g/dL (ref 6.4–8.3)

## 2017-01-12 MED ORDER — SODIUM CHLORIDE 0.9 % IV SOLN
Freq: Once | INTRAVENOUS | Status: AC
  Administered 2017-01-12: 11:00:00 via INTRAVENOUS

## 2017-01-12 MED ORDER — DEXAMETHASONE 4 MG PO TABS: 4.0000 mg | ORAL_TABLET | Freq: Every day | ORAL | 0 refills | Status: DC

## 2017-01-12 MED ORDER — SODIUM CHLORIDE 0.9 % IV SOLN
200.0000 mg | Freq: Once | INTRAVENOUS | Status: AC
  Administered 2017-01-12: 200 mg via INTRAVENOUS
  Filled 2017-01-12: qty 8

## 2017-01-12 NOTE — Telephone Encounter (Signed)
Scheduled appt per 1/3 los - Gave patient AVS and calender per los.

## 2017-01-12 NOTE — Progress Notes (Signed)
[facility] [place], [place] [address] [phone]   New Patient Evaluation  Date of Service: 01/12/17 Patient Name: Timothy Obrien Patient MRN: [id number] Patient DOB: July 03, 1960 Provider: Ventura Sellers, MD  Identifying Statement:  Timothy Obrien is a 57 y.o. male with Brain metastasis ([place]) [C79.31] who presents for initial consultation and evaluation regarding cancer associated neurologic deficits.    Referring Provider: Briscoe Deutscher, MD [address] [place], [place] [address]  Primary Cancer: NSCLC Stage IV  Oncologic History: 12/25/14: MRI demonstrates left frontal mestastasis, SRS is performed followed by resection. 12/23/16: Patient describes right leg weakness, some progression noted on MRI  History of Present Illness: The patient's records from the referring physician were obtained and reviewed and the patient interviewed to confirm this HPI.  Timothy Obrien initially presented to medical attention in December 2016 after experiencing a seizure, characterized as "right sided shaking".  This led to an MRI which demonstrated an enhancing mass consistent with metastatic adenocarcinoma.  This lesion underwent pre-operative SRS with Dr. Tammi Klippel followed by resection by Dr. Cyndy Freeze.  Since that time he has been followed serially with MRI, and recently completed 2 years of Pembrolizumab infusions for lung cancer, which has remained stable.  MRI in July 2018 demonstrated possible progression or radiation necrosis.  This was followed by a stable scan 2 months later after Vitamin E and Trenta therapy.  At present, he describes weakness and clumsiness mostly affecting the right foot and leg.  This has been present for about 6 months.  He is still able to ambulate independently, but needs to be conscientious about using the leg at times.    Medications: Current Outpatient Medications on File Prior to Visit  Medication Sig  Dispense Refill  . mirtazapine (REMERON) 30 MG tablet Take 1 tablet (30 mg total) by mouth at bedtime. 90 tablet 0  . pentoxifylline (TRENTAL) 400 MG CR tablet Take 1 tablet (400 mg total) by mouth 2 (two) times daily after a meal. 60 tablet 5  . sodium chloride 0.9 % SOLN 50 mL with pembrolizumab 100 MG/4ML SOLN 200 mg Inject 200 mg into the vein every 21 ( twenty-one) days. 12 Dose 0  . vitamin E 400 UNIT capsule Take 400 Units by mouth daily.    Marland Kitchen ALPRAZolam (XANAX) 0.25 MG tablet Take 1 tablet by mouth daily as needed.     No current facility-administered medications on file prior to visit.     Allergies: No Known Allergies Past Medical History:  Past Medical History:  Diagnosis Date  . Bronchitis   . Chronic fatigue 09/17/2015  . Depression 02/11/2016  . Hypertension 12/31/2015  . Lung cancer ([facility])    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  . Pneumonia   . Seizures ([facility])    last 12/11/14  . Shortness of breath dyspnea    with exertion  . Tobacco abuse    Past Surgical History:  Past Surgical History:  Procedure Laterality Date  . APPLICATION OF CRANIAL NAVIGATION N/A 12/26/2014   Procedure: APPLICATION OF CRANIAL NAVIGATION;  Surgeon: Kevan Ny Ditty, MD;  Location: [facility];  Service: Neurosurgery;  Laterality: N/A;  . CRANIOTOMY N/A 12/26/2014   Procedure: CRANIOTOMY TUMOR EXCISION with BrainLab;  Surgeon: Kevan Ny Ditty, MD;  Location: [facility];  Service: Neurosurgery;  Laterality: N/A;  CRANIOTOMY TUMOR EXCISION with Stealth  . HERNIA REPAIR    .  VASECTOMY    . VIDEO ASSISTED THORACOSCOPY (VATS)/WEDGE RESECTION Left 01/14/2015   Procedure: VIDEO ASSISTED THORACOSCOPY (VATS)/WEDGE RESECTION, Superior segmentectomy left  lower lobe, wedge resection of left upper lobe, multiple lymph node disection, On Q insertion.;  Surgeon: Grace Isaac, MD;  Location: [facility];  Service: Thoracic;  Laterality: Left;  Marland Kitchen VIDEO BRONCHOSCOPY Bilateral 12/16/2014   Procedure:  VIDEO BRONCHOSCOPY WITH FLUORO;  Surgeon: Collene Gobble, MD;  Location: [facility];  Service: Cardiopulmonary;  Laterality: Bilateral;  . VIDEO BRONCHOSCOPY N/A 01/14/2015   Procedure: VIDEO BRONCHOSCOPY;  Surgeon: Grace Isaac, MD;  Location[facility] OR;  Service: Thoracic;  Laterality: N/A;   Social History:  Social History   Socioeconomic History  . Marital status: Divorced    Spouse name: Not on file  . Number of children: Not on file  . Years of education: Not on file  . Highest education level: Not on file  Social Needs  . Financial resource strain: Not on file  . Food insecurity - worry: Not on file  . Food insecurity - inability: Not on file  . Transportation needs - medical: Not on file  . Transportation needs - non-medical: Not on file  Occupational History  . Not on file  Tobacco Use  . Smoking status: Former Smoker    Packs/day: 1.00    Years: 36.00    Pack years: 36.00    Types: Cigarettes    Start date: 12/14/1979    Last attempt to quit: 10/07/2015    Years since quitting: 1.2  . Smokeless tobacco: Current User  . Tobacco comment: Vapes: Few cigars a day. Quit cigarettes after easter.  Substance and Sexual Activity  . Alcohol use: Yes    Alcohol/week: 21.0 oz    Types: 35 Cans of beer per week    Comment: daily 6 beers a day for the past fefw years.   . Drug use: Yes    Types: Marijuana    Comment: last time 01/04/15  . Sexual activity: Not Currently  Other Topics Concern  . 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 He does have a cat but no other home pets. No mold exposure. Recent travel to [place] but with symptoms at the onset of travel.   Family History:  Family History  Problem Relation Age of Onset  . Breast cancer Mother   . Colon polyps Mother   . Arthritis Father     Review of Systems: Constitutional: Denies fevers, chills or abnormal weight loss Eyes: Denies  blurriness of vision Ears, nose, mouth, throat, and face: Denies mucositis or sore throat Respiratory: Denies cough, dyspnea or wheezes Cardiovascular: Denies palpitation, chest discomfort or lower extremity swelling Gastrointestinal:  Denies nausea, constipation, diarrhea GU: Denies dysuria or incontinence Skin: Denies abnormal skin rashes Neurological: Per HPI Musculoskeletal: Denies joint pain, back or neck discomfort. No decrease in ROM Behavioral/Psych: Denies anxiety, disturbance in thought content, and mood instability   Physical Exam: There were no vitals filed for this visit. KPS: 80. General: Alert, cooperative, pleasant, in no acute distress Head: Normal EENT: No conjunctival injection or scleral icterus. Oral mucosa moist Lungs: Resp effort normal Cardiac: Regular rate and rhythm Abdomen: Soft, non-distended abdomen Skin: No rashes cyanosis or petechiae. Extremities: No clubbing or edema  Neurologic Exam: Mental Status: Awake, alert, attentive to examiner. Oriented to self and environment. Language is fluent with intact comprehension.  Cranial Nerves: Visual  acuity is grossly normal. Visual fields are full. Extra-ocular movements intact. No ptosis. Face is symmetric, tongue midline. Motor: Tone and bulk are normal. Power is full in both arms, he is 4+/5 in right leg. Reflexes are symmetric, no pathologic reflexes present. Intact finger to nose bilaterally Sensory: Intact to light touch and temperature Gait: Normal but tandem gait is impaired   Labs: I have reviewed the data as listed    Component Value Date/Time   NA 137 01/12/2017 0846   K 4.4 01/12/2017 0846   CL 109 01/16/2015 0450   CO2 18 (L) 01/12/2017 0846   GLUCOSE 91 01/12/2017 0846   BUN 15.3 01/12/2017 0846   CREATININE 1.0 01/12/2017 0846   CALCIUM 9.0 01/12/2017 0846   PROT 7.2 01/12/2017 0846   ALBUMIN 3.9 01/12/2017 0846   AST 23 01/12/2017 0846   ALT 22 01/12/2017 0846   ALKPHOS 99  01/12/2017 0846   BILITOT 0.41 01/12/2017 0846   GFRNONAA >60 01/16/2015 0450   GFRAA >60 01/16/2015 0450   Lab Results  Component Value Date   WBC 7.0 01/12/2017   NEUTROABS 4.1 01/12/2017   HGB 15.9 01/12/2017   HCT 47.6 01/12/2017   MCV 95.2 01/12/2017   PLT 267 01/12/2017    Imaging:  Mr Brain W Wo Contrast  Result Date: 12/23/2016 CLINICAL DATA:  Follow-up treated metastatic lung cancer to the brain. Patient is status post SRS to a solitary left frontal brain metastasis December 2016. EXAM: MRI HEAD WITHOUT AND WITH CONTRAST TECHNIQUE: Multiplanar, multiecho pulse sequences of the brain and surrounding structures were obtained without and with intravenous contrast. CONTRAST:  18mL MULTIHANCE GADOBENATE DIMEGLUMINE 529 MG/ML IV SOLN COMPARISON:  09/15/2016 FINDINGS: Brain: Treated left frontal metastasis with gradually progressive enhancement during 2018. Enhancing area currently measures 16 x 14 mm as compared to 13 x 11 mm previously. Most of the progression appears to be along the medial and superior aspect of the lesion. The area of enhancement is centrally dark from blood products. This limits detection of dense cellular tumor by T2 and diffusion. Perfusion imaging would also be difficult due to potential susceptibility effects. There is mild vasogenic edema which is mildly progressed. No new lesion is seen. No infarct, hemorrhage, hydrocephalus, or shift Vascular: Major flow voids and vascular enhancements are preserved. Skull and upper cervical spine: No visible marrow lesion. Left frontal craniotomy is unremarkable. Sinuses/Orbits: Mucous retention cysts in the floor of the right maxillary sinus. IMPRESSION: 1. Treated left frontal lobe metastasis with continued progression of enhancement and edema. The enhancing area currently measures 16 x 14 mm as compared to 13 x 11 mm previously. Remote blood products are superimposed on the enhancing area, which would significantly complicate  DSC perfusion assessment. 2. No new lesion. Electronically Signed   By: Jonathon  Watts M.D.   On: 12/23/2016 13:31    [facility] Clinician Interpretation: I have personally reviewed the radiological images as listed.  My interpretation, in the context of the patient's clinical presentation, is treatment effect vs true progression   Assessment/Plan 1. Brain metastasis ([facility])  2. Right leg weakness  Timothy Obrien's focal neurologic deficit corresponds to region of T2 signal abnomality in the left frontal lobe related to his metastasis and subsequent treatment, seen on most recent scan.  This region was not affected on scans >6 months prior.   Whatever process is leading to growth of the lesion is gradual, and will continue to be monitored with serial imaging.  We are hopeful that   the focality is due to a process such as edema or treatment related inflammation which is reversible.   We recommended a trial of dexamethasone, 41m BID for 3 days, followed by 440mdaily for 5 days.  He is to call the clinic after this trial- if not effective we will discontinue therapy.  If there is improvement in motor function we will continue decadron but may lower the dose.  Additionally, we recommended and ordered physical therapy for the leg weakness.  He should return to clinic in ~12 weeks with an MRI for review.  His case will be reviewed in multidisciplinary brain tumor board.  We spent twenty additional minutes teaching regarding the natural history, biology, and historical experience in the treatment of neurologic complications of cancer. We also provided teaching sheets for the patient to take home as an additional resource.  We appreciate the opportunity to participate in the care of WiSANFORD Obrien  All questions were answered. The patient knows to call the clinic with any problems, questions or concerns. No barriers to learning were detected.  The total time spent in the encounter was 60 minutes and more  than 50% was on counseling and review of test results   ZaVentura SellersMD Medical Director of Neuro-Oncology [facility] [place]1/03/19 1:25 PM

## 2017-01-12 NOTE — Progress Notes (Signed)
[facility] [phone]   [phone]  OFFICE PROGRESS NOTE  Briscoe Deutscher, MD [address] [place] [address]  DIAGNOSIS:  Stage IV (T2a, N1, M1b) non-small cell lung cancer, adenocarcinoma, negative EGFR mutation but equivocal EGFR amplification, negative ALK gene translocation and negative ROS 1 but with PDL-1 expression 100% presented with left lower lobe lung mass in addition to left hilar adenopathy and solitary metastatic brain lesion diagnosed in December 2016.  PRIOR THERAPY: 1) status post stereotactic radiotherapy and surgical resection diagnosed in December 2016. 2) status post video bronchoscopy with left VATS and wedge resection of the left upper lobe and left lower lobe superior segmentectomy with lymph node dissection under the care of Dr. Servando Snare on 01/14/2015.  CURRENT THERAPY: First-line treatment with immunotherapy with Ketruda (pembrolizumab) 200 mg IV every 3 weeks status post 34 cycles. First cycle was given 02/18/2015.  INTERVAL HISTORY: Timothy Obrien 57 y.o. male returns to the clinic today for follow-up visit accompanied by his ex-wife.  The patient is feeling fine today with no specific complaints except for dragging his right leg.  He is scheduled to see Dr. Mickeal Skinner later today.  He denied having any current chest pain, shortness breath, cough or hemoptysis.  He denied having any fever or chills.  He has no nausea, vomiting, diarrhea or constipation.  He continues to tolerate his treatment with immunotherapy fairly well.  He is here today for evaluation before starting the last cycle of this course.   MEDICAL HISTORY: Past Medical History:  Diagnosis Date  . Bronchitis   . Chronic fatigue 09/17/2015  . Depression 02/11/2016  . Hypertension 12/31/2015  . Lung cancer ([facility])    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  . Pneumonia   . Seizures ([facility])    last 12/11/14  . Shortness of breath dyspnea    with exertion    . Tobacco abuse     ALLERGIES:  has No Known Allergies.  MEDICATIONS:  Current Outpatient Medications  Medication Sig Dispense Refill  . ALPRAZolam (XANAX) 0.25 MG tablet Take 1 tablet by mouth daily as needed.    . mirtazapine (REMERON) 30 MG tablet Take 1 tablet (30 mg total) by mouth at bedtime. 90 tablet 0  . pentoxifylline (TRENTAL) 400 MG CR tablet Take 1 tablet (400 mg total) by mouth 2 (two) times daily after a meal. 60 tablet 5  . sodium chloride 0.9 % SOLN 50 mL with pembrolizumab 100 MG/4ML SOLN 200 mg Inject 200 mg into the vein every 21 ( twenty-one) days. 12 Dose 0  . vitamin E 400 UNIT capsule Take 400 Units by mouth daily.     No current facility-administered medications for this visit.     SURGICAL HISTORY:  Past Surgical History:  Procedure Laterality Date  . APPLICATION OF CRANIAL NAVIGATION N/A 12/26/2014   Procedure: APPLICATION OF CRANIAL NAVIGATION;  Surgeon: Kevan Ny Ditty, MD;  Location: [facility];  Service: Neurosurgery;  Laterality: N/A;  . CRANIOTOMY N/A 12/26/2014   Procedure: CRANIOTOMY TUMOR EXCISION with BrainLab;  Surgeon: Kevan Ny Ditty, MD;  Location: [facility];  Service: Neurosurgery;  Laterality: N/A;  CRANIOTOMY TUMOR EXCISION with Stealth  . HERNIA REPAIR    . VASECTOMY    . VIDEO ASSISTED THORACOSCOPY (VATS)/WEDGE RESECTION Left 01/14/2015   Procedure: VIDEO ASSISTED THORACOSCOPY (VATS)/WEDGE RESECTION, Superior segmentectomy left  lower lobe, wedge resection of left upper lobe, multiple lymph node  disection, On Q insertion.;  Surgeon: Edward B Gerhardt, MD;  Location: [facility];  Service: Thoracic;  Laterality: Left;  . VIDEO BRONCHOSCOPY Bilateral 12/16/2014   Procedure: VIDEO BRONCHOSCOPY WITH FLUORO;  Surgeon: Robert S Byrum, MD;  Location: [facility];  Service: Cardiopulmonary;  Laterality: Bilateral;  . VIDEO BRONCHOSCOPY N/A 01/14/2015   Procedure: VIDEO BRONCHOSCOPY;  Surgeon: Edward B Gerhardt, MD;  Location: [facility];   Service: Thoracic;  Laterality: N/A;    REVIEW OF SYSTEMS:  A comprehensive review of systems was negative.   PHYSICAL EXAMINATION: General appearance: alert, cooperative and no distress Head: Normocephalic, without obvious abnormality, atraumatic Neck: no adenopathy, no JVD, supple, symmetrical, trachea midline and thyroid not enlarged, symmetric, no tenderness/mass/nodules Lymph nodes: Cervical, supraclavicular, and axillary nodes normal. Resp: clear to auscultation bilaterally Back: symmetric, no curvature. ROM normal. No CVA tenderness. Cardio: regular rate and rhythm, S1, S2 normal, no murmur, click, rub or gallop GI: soft, non-tender; bowel sounds normal; no masses,  no organomegaly Extremities: extremities normal, atraumatic, no cyanosis or edema  ECOG PERFORMANCE STATUS: 0 - Asymptomatic  Blood pressure 135/88, pulse 72, temperature 97.8 F (36.6 C), temperature source Oral, resp. rate 18, height 6' (1.829 m), weight 187 lb (84.8 kg), SpO2 100 %.  LABORATORY DATA: Lab Results  Component Value Date   WBC 7.0 01/12/2017   HGB 15.9 01/12/2017   HCT 47.6 01/12/2017   MCV 95.2 01/12/2017   PLT 267 01/12/2017      Chemistry      Component Value Date/Time   NA 137 01/12/2017 0846   K 4.4 01/12/2017 0846   CL 109 01/16/2015 0450   CO2 18 (L) 01/12/2017 0846   BUN 15.3 01/12/2017 0846   CREATININE 1.0 01/12/2017 0846      Component Value Date/Time   CALCIUM 9.0 01/12/2017 0846   ALKPHOS 99 01/12/2017 0846   AST 23 01/12/2017 0846   ALT 22 01/12/2017 0846   BILITOT 0.41 01/12/2017 0846       RADIOGRAPHIC STUDIES: Mr Brain W Wo Contrast  Result Date: 12/23/2016 CLINICAL DATA:  Follow-up treated metastatic lung cancer to the brain. Patient is status post SRS to a solitary left frontal brain metastasis December 2016. EXAM: MRI HEAD WITHOUT AND WITH CONTRAST TECHNIQUE: Multiplanar, multiecho pulse sequences of the brain and surrounding structures were obtained without  and with intravenous contrast. CONTRAST:  18mL MULTIHANCE GADOBENATE DIMEGLUMINE 529 MG/ML IV SOLN COMPARISON:  09/15/2016 FINDINGS: Brain: Treated left frontal metastasis with gradually progressive enhancement during 2018. Enhancing area currently measures 16 x 14 mm as compared to 13 x 11 mm previously. Most of the progression appears to be along the medial and superior aspect of the lesion. The area of enhancement is centrally dark from blood products. This limits detection of dense cellular tumor by T2 and diffusion. Perfusion imaging would also be difficult due to potential susceptibility effects. There is mild vasogenic edema which is mildly progressed. No new lesion is seen. No infarct, hemorrhage, hydrocephalus, or shift Vascular: Major flow voids and vascular enhancements are preserved. Skull and upper cervical spine: No visible marrow lesion. Left frontal craniotomy is unremarkable. Sinuses/Orbits: Mucous retention cysts in the floor of the right maxillary sinus. IMPRESSION: 1. Treated left frontal lobe metastasis with continued progression of enhancement and edema. The enhancing area currently measures 16 x 14 mm as compared to 13 x 11 mm previously. Remote blood products are superimposed on the enhancing area, which would significantly complicate DSC perfusion assessment. 2. No new   lesion. Electronically Signed   By: Jonathon  Watts M.D.   On: 12/23/2016 13:31    ASSESSMENT AND PLAN:  This is a very pleasant 56 years old white male with metastatic non-small cell lung cancer, adenocarcinoma with positive PDL 1 expression of 100%. He is currently undergoing treatment with Ketruda (pembrolizumab) 200 mg IV every 3 weeks, status post 34 cycles. The patient tolerated the last cycle of his treatment well. I recommended for him to proceed with cycle #35 today as a scheduled.  I will see him back for follow-up visit in 1 month for evaluation after repeating CT scan of the chest, abdomen and pelvis for  restaging of his disease. He was advised to call immediately if he has any concerning symptoms in the interval. The patient voices understanding of current disease status and treatment options and is in agreement with the current care plan. All questions were answered. The patient knows to call the clinic with any problems, questions or concerns. We can certainly see the patient much sooner if necessary.  Disclaimer: This note was dictated with voice recognition software. Similar sounding words can inadvertently be transcribed and may not be corrected upon review.       

## 2017-01-12 NOTE — Patient Instructions (Signed)
[facility] Discharge Instructions for Patients Receiving Chemotherapy  Today you received the following chemotherapy agents:  Keytruda.  To help prevent nausea and vomiting after your treatment, we encourage you to take your nausea medication as directed.   If you develop nausea and vomiting that is not controlled by your nausea medication, call the clinic.   BELOW ARE SYMPTOMS THAT SHOULD BE REPORTED IMMEDIATELY:  *FEVER GREATER THAN 100.5 F  *CHILLS WITH OR WITHOUT FEVER  NAUSEA AND VOMITING THAT IS NOT CONTROLLED WITH YOUR NAUSEA MEDICATION  *UNUSUAL SHORTNESS OF BREATH  *UNUSUAL BRUISING OR BLEEDING  TENDERNESS IN MOUTH AND THROAT WITH OR WITHOUT PRESENCE OF ULCERS  *URINARY PROBLEMS  *BOWEL PROBLEMS  UNUSUAL RASH Items with * indicate a potential emergency and should be followed up as soon as possible.  Feel free to call the clinic should you have any questions or concerns. The clinic phone number is [phone].  Please show the CHEMO ALERT CARD at check-in to the Emergency Department and triage nurse.    

## 2017-01-13 ENCOUNTER — Other Ambulatory Visit: Payer: Self-pay | Admitting: *Deleted

## 2017-01-20 ENCOUNTER — Other Ambulatory Visit: Payer: Self-pay | Admitting: *Deleted

## 2017-01-20 ENCOUNTER — Telehealth: Payer: Self-pay | Admitting: *Deleted

## 2017-01-20 DIAGNOSIS — M21371 Foot drop, right foot: Secondary | ICD-10-CM

## 2017-01-20 MED ORDER — DEXAMETHASONE 4 MG PO TABS: 4.0000 mg | ORAL_TABLET | Freq: Every day | ORAL | 3 refills | Status: DC

## 2017-01-20 NOTE — Telephone Encounter (Signed)
Patient called as previously requested by Dr. Mickeal Skinner for update on effectiveness of Decadron dosing that was initated for foot drop and weakness.  Patient reports that it has helped tremendously, not completely resolved but much improved.  Per Dr. Mickeal Skinner ok to continue Decadron 4 mg daily until we see him again.  Refill ordered.    Follow up on Physical Therapy consult, [facility] to contact patient and set up new patient appt.  Patient aware.  No questions.

## 2017-01-23 ENCOUNTER — Other Ambulatory Visit: Payer: Self-pay | Admitting: *Deleted

## 2017-01-25 ENCOUNTER — Other Ambulatory Visit: Payer: Self-pay | Admitting: Radiation Therapy

## 2017-01-25 DIAGNOSIS — C7949 Secondary malignant neoplasm of other parts of nervous system: Principal | ICD-10-CM

## 2017-01-25 DIAGNOSIS — C7931 Secondary malignant neoplasm of brain: Secondary | ICD-10-CM

## 2017-01-31 ENCOUNTER — Telehealth: Payer: Self-pay | Admitting: *Deleted

## 2017-01-31 NOTE — Telephone Encounter (Signed)
Patients ex-wife called to question the order for Physical Therapy and that they had not received any phone calls from them to schedule and was concerned because the right leg was tremulous during climbing a ladder after a full day of activity.    Called [facility] Physical Therapy to question when New Patient appointment would be scheduled as the patients condition is not improving at home and he is in need of PT as soon as possible for foot drop.  Advised they will call to schedule.

## 2017-02-02 ENCOUNTER — Encounter: Payer: Self-pay | Admitting: Physical Therapy

## 2017-02-02 ENCOUNTER — Ambulatory Visit: Payer: BLUE CROSS/BLUE SHIELD

## 2017-02-02 ENCOUNTER — Ambulatory Visit: Payer: BLUE CROSS/BLUE SHIELD | Attending: Internal Medicine | Admitting: Physical Therapy

## 2017-02-02 ENCOUNTER — Ambulatory Visit: Payer: BLUE CROSS/BLUE SHIELD | Admitting: Internal Medicine

## 2017-02-02 ENCOUNTER — Other Ambulatory Visit: Payer: BLUE CROSS/BLUE SHIELD

## 2017-02-02 DIAGNOSIS — M6281 Muscle weakness (generalized): Secondary | ICD-10-CM | POA: Diagnosis present

## 2017-02-02 DIAGNOSIS — R2689 Other abnormalities of gait and mobility: Secondary | ICD-10-CM | POA: Diagnosis present

## 2017-02-02 DIAGNOSIS — R2681 Unsteadiness on feet: Secondary | ICD-10-CM | POA: Insufficient documentation

## 2017-02-02 NOTE — Patient Instructions (Addendum)
ANKLE: Pumps    Point toes down, then up. __10_ reps per set, __2-3_ sets per day   Copyright  VHI. All rights reserved.  HIP / KNEE: Extension - Sit to Stand    Sitting, lean chest forward, raise hips up from surface. Straighten hips and knees. Weight bear equally on left and right sides. Backs of legs should not push off surface. _10__ reps per set, __1-2_ sets per day  Copyright  VHI. All rights reserved.

## 2017-02-03 ENCOUNTER — Ambulatory Visit: Payer: BLUE CROSS/BLUE SHIELD | Admitting: Physical Therapy

## 2017-02-03 ENCOUNTER — Encounter: Payer: Self-pay | Admitting: Physical Therapy

## 2017-02-03 DIAGNOSIS — R2689 Other abnormalities of gait and mobility: Secondary | ICD-10-CM | POA: Diagnosis not present

## 2017-02-03 DIAGNOSIS — M6281 Muscle weakness (generalized): Secondary | ICD-10-CM

## 2017-02-03 DIAGNOSIS — R2681 Unsteadiness on feet: Secondary | ICD-10-CM

## 2017-02-03 NOTE — Patient Instructions (Signed)
ANKLE: Plantarflexion, Unilateral - Standing    Stand on leg with knee straight. Raise heel up as high as possible. __10_ reps per set, __1_ sets per day, _5__ days per week.  Hold onto a support lightly with fingertips. Repeat with knee slightly bent.  Copyright  VHI. All rights reserved.   Resisted - Four Way Hip     With theraband around right ankle, balance on left leg and complete leg kicks in the following directions:  1. Facing toward theraband, extend right leg behind you with knee straight. HOld for 3 sec. Avoid bending forward at your hips. Repeat 10 times. 2. Turn to right, slowly kick leg out to the side while keeping your toes facing forward. Hold for 3 sec. Avoid leaning to the side. Repeat 10 times.  Repeat on right and left legs.

## 2017-02-03 NOTE — Therapy (Signed)
[facility] [address] [place], [place], [address] Phone: [phone]   Fax:  [phone]  Physical Therapy Evaluation  Patient Details  Name: Timothy Obrien MRN: [id number] Date of Birth: Oct 31, 1960 Referring Provider: Rhae Hammock, RN   Encounter Date: 02/02/2017  PT End of Session - 02/02/17 2135    Visit Number  1    Number of Visits  10    Date for PT Re-Evaluation  04/03/17    Authorization Type  BC[employer]    PT Start Time  0805    PT Stop Time  0848    PT Time Calculation (min)  43 min    Activity Tolerance  Patient tolerated treatment well    Behavior During Therapy  [facility] for tasks assessed/performed       Past Medical History:  Diagnosis Date  . Bronchitis   . Chronic fatigue 09/17/2015  . Depression 02/11/2016  . Hypertension 12/31/2015  . 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 Pneumonia   . Seizures ([place])    last 12/11/14  . Shortness of breath dyspnea    with exertion  . Tobacco abuse     Past Surgical History:  Procedure Laterality Date  . APPLICATION OF CRANIAL NAVIGATION N/A 12/26/2014   Procedure: APPLICATION OF CRANIAL NAVIGATION;  Surgeon: Kevan Ny Ditty, MD;  Location: [place] [facility]UR[facility] ORS;  Service: Neurosurgery;  Laterality: N/A;  . CRANIOTOMY N/A 12/26/2014   Procedure: CRANIOTOMY TUMOR EXCISION with BrainLab;  Surgeon: Kevan Ny Ditty, MD;  Location: [place] [facility]UR[facility]S;  Service: Neurosurgery;  Laterality: N/A;  CRANIOTOMY TUMOR EXCISION with Stealth  . HERNIA REPAIR    . VASECTOMY    . VIDEO ASSISTED THORACOSCOPY (VATS)/WEDGE RESECTION Left 01/14/2015   Procedure: VIDEO ASSISTED THORACOSCOPY (VATS)/WEDGE RESECTION, Superior segmentectomy left  lower lobe, wedge resection of left upper lobe, multiple lymph node disection, On Q insertion.;  Surgeon: Grace Isaac, MD;  Location: [facility];  Service: Thoracic;  Laterality: Left;  Marland Kitchen VIDEO BRONCHOSCOPY Bilateral 12/16/2014    Procedure: VIDEO BRONCHOSCOPY WITH FLUORO;  Surgeon: Collene Gobble, MD;  Location: [facility];  Service: Cardiopulmonary;  Laterality: Bilateral;  . VIDEO BRONCHOSCOPY N/A 01/14/2015   Procedure: VIDEO BRONCHOSCOPY;  Surgeon: Grace Isaac, MD;  Location: [facility] OR;  Service: Thoracic;  Laterality: N/A;    There were no vitals filed for this visit.   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  Has been on steroids and MD suggested PT to address RLE weakness.  No falls, but did notice that RLE shaking after doing work on a ladder over the weekend.    Pertinent History  December 2016:  initial c/o seizure, characterized as "right sided shaking"; this led to an MRI which demonstrated an enhancing mass consistent with metastatic adenocarcinoma. (treated with resection and chemo) MRI in July 2018 demonstrated possible progression or radiation necrosis in brain     Patient Stated Goals  Pt's goals for therapy to get RLE stronger and get rid of the weakness.    Currently in Pain?  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   No    Is the patient reluctant to leave their home because of a fear of falling?   No      Home Environment   Living Environment  Private residence    Living Arrangements  Alone    Type of [place] Access  Stairs to enter    Entrance Stairs-Number of Steps  1    Entrance Stairs-Rails   None    Home Layout  Two level    Alternate Level Stairs-Number of Steps  13    Alternate Level Stairs-Rails  Left    Home Equipment  [place] - single point Antique canes      Prior Function   Level of Independence  Independent    Vocation  Retired      Observation/Other Assessments   Focus on Therapeutic Outcomes (FOTO)   NA      Sensation   Light Touch  Appears Intact      Tone   Assessment Location  Right Lower Extremity      ROM / Strength   AROM / PROM / Strength  AROM;PROM;Strength      AROM   Overall AROM Comments  R ankle dorsiflexion 10 degrees      PROM   Overall PROM Comments  20 degrees P/ROM R ankle dorsiflexion      Strength   Overall Strength Comments  Grossly tested 4/5 R quads and hamstrings, R ankle dorsiflrexion 3-/5 R hip abduc 3+/5, ext 3-/5    Strength Assessment Site  Hip;Knee;Ankle    Right/Left Hip  Right;Left    Right Hip Flexion  4/5    Right Hip Extension  3-/5    Right Hip ABduction  3+/5    Left Hip Flexion  5/5    Left Hip Extension  4/5    Right/Left Knee  Right;Left    Right Knee Flexion  3+/5    Right Knee Extension  4/5    Left Knee Flexion  5/5    Left Knee Extension  5/5    Right/Left Ankle  Right;Left    Right Ankle Dorsiflexion  3-/5    Right Ankle Plantar Flexion  2/5    Right Ankle Inversion  3/5    Right Ankle Eversion  3/5    Left Ankle Dorsiflexion  4/5    Left Ankle Plantar Flexion  4/5    Left Ankle Inversion  4/5    Left Ankle Eversion  4/5      Transfers   Transfers  Sit to Stand;Stand to Sit    Sit to Stand  6: Modified independent (Device/Increase time);Without upper extremity assist;From chair/3-in-1      Ambulation/Gait   Ambulation/Gait  Yes    Ambulation/Gait Assistance  6: Modified independent (Device/Increase time)    Ambulation Distance (Feet)  200 Feet    Assistive device  None    Gait Pattern  Step-through pattern;Decreased arm swing - right;Decreased step length - right;Decreased dorsiflexion -  right;Decreased weight shift to right;Decreased trunk rotation  foot slap on right; initiation of steppage gait    Ambulation Surface  Level;Unlevel    Gait velocity  11.25  sec = 2.91 ft/sec    Stairs  Yes    Stairs Assistance  6: Modified independent (Device/Increase time)    Stair Management Technique  One rail Left;Alternating pattern    Number of Stairs  4    Height of Stairs  6    Gait Comments  Trial of gait x 100 ft with foot-up brace, with improved foot clearance and heelstrike noted, but also increased foot slap noted with gait.  Following pt's assesment of stair negotiation, pt noted to have RUE /hand tremor briefly, which subsides after 5-10 seconds.      Standardized Balance Assessment   Standardized Balance Assessment  Timed Up and Go Test      Timed Up and Go Test   Normal TUG (seconds)  14.28    TUG Comments  Scores >13.5 seconds indicate increased fall risk.      RLE Tone   RLE Tone  Mild Clonus noted RLE with passive quick stretch R dflex             Objective measurements completed on examination: See above findings.              PT Education - 02/02/17 2134    Education provided  Yes    Education Details  Initial HEP-see instructions; discussed importance of equal weightbearing through RLE in standing activities; discussed trial of foot-up AFO and limitations that it accentuates foot slap with gait    Person(s) Educated  Patient    Methods  Explanation;Demonstration;Handout    Comprehension  Verbalized understanding;Returned demonstration          PT Long Term Goals - 02/02/17 2147      PT LONG TERM GOAL #1   Title  Pt will be independent with HEP for improved strength, balance, and gait.  TARGET 03/10/17    Time  5    Period  Weeks    Status  New    Target Date  03/10/17      PT LONG TERM GOAL #2   Title  Pt will improve TUG score to less than or equal to 13.5 seconds for decreased fall risk.    Time  5    Period  Weeks    Status   New    Target Date  03/10/17      PT LONG TERM GOAL #3   Title  6MWT to be assessed, with goal to be written as appropriate.    Time  5    Period  Weeks    Status  New    Target Date  03/10/17      PT LONG TERM GOAL #4   Title  Pt will ambulate at least 1000 ft, over indoor/outdoor surfaces, with appropriate device/orthotic for improved safety and efficiency with gait.    Time  5    Period  Weeks    Status  New    Target Date  03/10/17             Plan - 02/02/17 2136    Clinical Impression Statement  Pt is a 57 year old male who presents to OP PT with history of lung cancer with metastasis to brain in 2016, which was treated with resection and chemotherapy.  Approximately 6 months ago, pt noted increased RLE weakness and foot drop with gait.  Scan in July 2018  question progression versus radiation necrosis.   He presents today with decreased strength in RLE (proximal>distal), abnormal tone, decreased flexibility, decreased timing and coordination of gait (decreased trunk rotation and decreased R arm swing), foot drop, decreased balance.  Pt is at fall risk per TUG score of 14.28 seconds.  Pt has had no falls, but does note increased fatigue/shakiness in RLE with work on ladder over the weekend.  Pt will benefit from skilled PT to address the above stated deficits to decrease fall risk and improve functional mobility.    History and Personal Factors relevant to plan of care:  lung cancer with brain metastasis, RLE weakness in past 6 months with R foot drop    Clinical Presentation  Evolving    Clinical Presentation due to:  hx of cancer    Clinical Decision Making  Moderate    Rehab Potential  Good    Clinical Impairments Affecting Rehab Potential  Hx of cancer/mets    PT Frequency  2x / week    PT Duration  Other (comment) 5 weeks    PT Treatment/Interventions  ADLs/Self Care Home Management;Electrical Stimulation;DME Instruction;Gait training;Stair training;Functional mobility  training;Therapeutic activities;Therapeutic exercise;Balance training;Orthotic Fit/Training;Patient/family education;Neuromuscular re-education    PT Next Visit Plan  Perform 6MWT and write goal.  Review and continue to update HEP to address RLE weakness, R ankle weakness; gait training with additional orthotics/?Bioness-unsure due to pt's hx of cancer    Consulted and Agree with Plan of Care  Patient       Patient will benefit from skilled therapeutic intervention in order to improve the following deficits and impairments:  Abnormal gait, Decreased activity tolerance, Decreased balance, Decreased mobility, Difficulty walking, Decreased strength, Impaired flexibility, Impaired tone, Postural dysfunction  Visit Diagnosis: Other abnormalities of gait and mobility  Muscle weakness (generalized)  Unsteadiness on feet     Problem List Patient Active Problem List   Diagnosis Date Noted  . Right leg weakness 01/12/2017  . Depression 02/11/2016  . Hypertension 12/31/2015  . Chronic fatigue 09/17/2015  . Shoulder pain, left 08/06/2015  . Encounter for antineoplastic immunotherapy 03/12/2015  . Malnutrition of moderate degree 01/17/2015  . S/P craniotomy 12/26/2014  . Brain metastasis ([place]) 12/26/2014  . Primary cancer of left lower lobe of lung ([place]) 12/22/2014  . Right sided weakness 12/12/2014  . Tobacco abuse     Sosha Shepherd W. 02/03/2017, 7:58 AM  Frazier Butt., PT   [facility] [address] [place], [place], [address] Phone: [phone]   Fax:  [phone]  Name: Timothy Obrien MRN: [id number] Date of Birth: November 19, 1960

## 2017-02-03 NOTE — Therapy (Signed)
[facility] [address] [place], [place], [address] Phone: [phone]   Fax:  [phone]  Physical Therapy Treatment  Patient Details  Name: Timothy Obrien MRN: [id number] Date of Birth: 30-Aug-1960 Referring Provider: Rhae Hammock, RN   Encounter Date: 02/03/2017  PT End of Session - 02/03/17 1151    Visit Number  2    Number of Visits  10    Date for PT Re-Evaluation  04/03/17    Authorization Type  [employer]    PT Start Time  0845    PT Stop Time  0930    PT Time Calculation (min)  45 min    Activity Tolerance  Patient tolerated treatment well    Behavior During Therapy  [facility] for tasks assessed/performed       Past Medical History:  Diagnosis Date  . Bronchitis   . Chronic fatigue 09/17/2015  . Depression 02/11/2016  . Hypertension 12/31/2015  . 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 Pneumonia   . Seizures ([place])    last 12/11/14  . Shortness of breath dyspnea    with exertion  . Tobacco abuse     Past Surgical History:  Procedure Laterality Date  . APPLICATION OF CRANIAL NAVIGATION N/A 12/26/2014   Procedure: APPLICATION OF CRANIAL NAVIGATION;  Surgeon: Kevan Ny Ditty, MD;  Location: [place] NEUR[facility] ORS;  Service: Neurosurgery;  Laterality: N/A;  . CRANIOTOMY N/A 12/26/2014   Procedure: CRANIOTOMY TUMOR EXCISION with BrainLab;  Surgeon: Kevan Ny Ditty, MD;  Location: [place] [facility]UR[facility] ORS;  Service: Neurosurgery;  Laterality: N/A;  CRANIOTOMY TUMOR EXCISION with Stealth  . HERNIA REPAIR    . VASECTOMY    . VIDEO ASSISTED THORACOSCOPY (VATS)/WEDGE RESECTION Left 01/14/2015   Procedure: VIDEO ASSISTED THORACOSCOPY (VATS)/WEDGE RESECTION, Superior segmentectomy left  lower lobe, wedge resection of left upper lobe, multiple lymph node disection, On Q insertion.;  Surgeon: Grace Isaac, MD;  Location: [facility];  Service: Thoracic;  Laterality: Left;  Marland Kitchen VIDEO BRONCHOSCOPY Bilateral 12/16/2014    Procedure: VIDEO BRONCHOSCOPY WITH FLUORO;  Surgeon: Collene Gobble, MD;  Location: [facility];  Service: Cardiopulmonary;  Laterality: Bilateral;  . VIDEO BRONCHOSCOPY N/A 01/14/2015   Procedure: VIDEO BRONCHOSCOPY;  Surgeon: Grace Isaac, MD;  Location: [facility] OR;  Service: Thoracic;  Laterality: N/A;    There were no vitals filed for this visit.  Subjective Assessment - 02/03/17 0845    Subjective  No changes. No seizures since Dec 2016. Began Decadron 01/13/17 and will be taking until March when returns to see Dr. Mickeal Skinner. Plans to have MRI of brain at that time. Reports 2 episodes of extremity "shaking" in past week: 1) when standing on ladder installing smoke detector (?clonus per description), 2) RUE during PT evaluation 1/24. Inquiring re: "electrical device the other therapist told me about."     Pertinent History  December 2016:  initial c/o seizure, characterized as "right sided shaking"; this led to an MRI which demonstrated an enhancing mass consistent with metastatic adenocarcinoma. (treated with resection and chemo) MRI in July 2018 demonstrated possible progression or radiation necrosis in brain     Patient Stated Goals  Pt's goals for therapy to get RLE stronger and get rid of the weakness.    Currently in Pain?  No/denies         [facility] PT Assessment - 02/03/17 0854      6 Minute Walk- Baseline  6 Minute Walk- Baseline  yes    BP (mmHg)  142/60    HR (bpm)  73    02 Sat (%RA)  97 %    Modified Borg Scale for Dyspnea  0- Nothing at all    Perceived Rate of Exertion (Borg)  7- Very, very light      6 Minute walk- Post Test   6 Minute Walk Post Test  yes    BP (mmHg)  142/82    HR (bpm)  80    02 Sat (%RA)  98 %    Modified Borg Scale for Dyspnea  1- Very mild shortness of breath    Perceived Rate of Exertion (Borg)  10-      6 minute walk test results    Aerobic Endurance Distance Walked  1134 norm for 56 yo not found; 28-56 yo male 1634 ft    Endurance additional  comments  Rt foot drop increased with circumduction increased as progressively fatigued                  [facility] Adult PT Treatment/Exercise - 02/03/17 1125      Ambulation/Gait   Ambulation/Gait  Yes    Ambulation/Gait Assistance  6: Modified independent (Device/Increase time)    Ambulation Distance (Feet)  1134 Feet during 6 MWT    Assistive device  None    Gait Pattern  Step-through pattern;Decreased step length - left;Decreased dorsiflexion - right;Right circumduction;Decreased trunk rotation;Decreased arm swing - right    Ambulation Surface  Indoor      Exercises   Exercises  Knee/Hip;Ankle      Knee/Hip Exercises: Standing   Heel Raises  Right;Left;Both;3 sets;10 reps bil ascend, RLE descend vs RLE only    Knee Flexion  Strengthening;Both;1 set;10 reps green band    Hip Abduction  Stengthening;Both;2 sets;10 reps;Knee straight no band 5 sec hold; green band 3 sec hold    Hip Extension  Stengthening;Both;1 set;10 reps green band             PT Education - 02/03/17 1146    Education provided  Yes    Education Details  additions to HEP; results of 6 MWT (although could not find norm value for 56 yo); concern re: previous (and ?recent) seizure and inability to use Bioness for this reason;     Person(s) Educated  Patient    Methods  Explanation;Demonstration;Handout    Comprehension  Verbalized understanding;Returned demonstration          PT Long Term Goals - 02/03/17 1809      PT LONG TERM GOAL #1   Title  Pt will be independent with HEP for improved strength, balance, and gait.  TARGET 03/10/17    Time  5    Period  Weeks    Status  New      PT LONG TERM GOAL #2   Title  Pt will improve TUG score to less than or equal to 13.5 seconds for decreased fall risk.    Time  5    Period  Weeks    Status  New      PT LONG TERM GOAL #3   Title  6MWT to be assessed, with goal to be written as appropriate.    Baseline  02/03/17  1134 ft    Period  Weeks     Status  Achieved      PT LONG TERM GOAL #4   Title  Pt will ambulate at least  1000 ft, over indoor/outdoor surfaces, with appropriate device/orthotic for improved safety and efficiency with gait.    Time  5    Period  Weeks    Status  New      PT LONG TERM GOAL #5   Title  Patient will improve 6 MWT to >=1300 ft (closer to age-based norm) demonstrating improved quality/safety of gait pattern.     Time  5    Period  Weeks    Status  New            Plan - 02/04/17 1143    Clinical Impression Statement  Session focused on assessing cardiopulmonary and muscular endurance via 6 minute walk test and updating HEP to address strength and balance deficits. Discussed history of seizure and precaution/contraindication to use of functional e-stim for RLE strengthening and gait training. Patient highly motivated and anticipate will benefit from continued PT.     Rehab Potential  Good    Clinical Impairments Affecting Rehab Potential  Hx of cancer/mets    PT Frequency  2x / week    PT Duration  Other (comment) 5 weeks    PT Treatment/Interventions  ADLs/Self Care Home Management;Electrical Stimulation;DME Instruction;Gait training;Stair training;Functional mobility training;Therapeutic activities;Therapeutic exercise;Balance training;Orthotic Fit/Training;Patient/family education;Neuromuscular re-education    PT Next Visit Plan Give pt comparison of his 6MWT results to norm (His:1134 ft; norm for 56 yo not found; 38-56 yo male 1634 ft) Review and continue to update HEP to address RLE weakness, R ankle weakness; gait training with additional orthotics for foot drop    Consulted and Agree with Plan of Care  Patient       Patient will benefit from skilled therapeutic intervention in order to improve the following deficits and impairments:  Abnormal gait, Decreased activity tolerance, Decreased balance, Decreased mobility, Difficulty walking, Decreased strength, Impaired flexibility, Impaired tone,  Postural dysfunction  Visit Diagnosis: Other abnormalities of gait and mobility  Muscle weakness (generalized)  Unsteadiness on feet     Problem List Patient Active Problem List   Diagnosis Date Noted  . Right leg weakness 01/12/2017  . Depression 02/11/2016  . Hypertension 12/31/2015  . Chronic fatigue 09/17/2015  . Shoulder pain, left 08/06/2015  . Encounter for antineoplastic immunotherapy 03/12/2015  . Malnutrition of moderate degree 01/17/2015  . S/P craniotomy 12/26/2014  . Brain metastasis ([place]) 12/26/2014  . Primary cancer of left lower lobe of lung ([place]) 12/22/2014  . Right sided weakness 12/12/2014  . Tobacco abuse     Timothy Obrien, PT 02/04/2017, 11:53 AM  [facility] [address] [place], [place], [address] Phone: [phone]   Fax:  [phone]  Name: Timothy Obrien MRN: [id number] Date of Birth: [birth year]

## 2017-02-06 ENCOUNTER — Encounter: Payer: Self-pay | Admitting: Internal Medicine

## 2017-02-06 NOTE — Progress Notes (Signed)
Pt is approved w/ [employer] for [employer] from 01/10/17 to 01/09/18 for $25,000.

## 2017-02-07 ENCOUNTER — Encounter: Payer: Self-pay | Admitting: Physical Therapy

## 2017-02-07 ENCOUNTER — Ambulatory Visit: Payer: BLUE CROSS/BLUE SHIELD | Admitting: Physical Therapy

## 2017-02-07 DIAGNOSIS — M6281 Muscle weakness (generalized): Secondary | ICD-10-CM

## 2017-02-07 DIAGNOSIS — R2689 Other abnormalities of gait and mobility: Secondary | ICD-10-CM

## 2017-02-07 NOTE — Therapy (Signed)
[facility] [address] [place], [place], [address] Phone: [phone]   Fax:  [phone]  Physical Therapy Treatment  Patient Details  Name: Timothy Obrien MRN: [id number] Date of Birth: [birth year] Referring Provider: Rhae Hammock, RN   Encounter Date: 02/07/2017  PT End of Session - 02/07/17 0946    Visit Number  3    Number of Visits  10    Date for PT Re-Evaluation  04/03/17    Authorization Type  BC[employer]    PT Start Time  0850    PT Stop Time  0930    PT Time Calculation (min)  40 min    Activity Tolerance  Patient tolerated treatment well    Behavior During Therapy  [facility] for tasks assessed/performed       Past Medical History:  Diagnosis Date  . Bronchitis   . Chronic fatigue 09/17/2015  . Depression 02/11/2016  . Hypertension 12/31/2015  . Lung cancer ([facility])    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  . Pneumonia   . Seizures ([facility])    last 12/11/14  . Shortness of breath dyspnea    with exertion  . Tobacco abuse     Past Surgical History:  Procedure Laterality Date  . APPLICATION OF CRANIAL NAVIGATION N/A 12/26/2014   Procedure: APPLICATION OF CRANIAL NAVIGATION;  Surgeon: Kevan Ny Ditty, MD;  Location: [facility];  Service: Neurosurgery;  Laterality: N/A;  . CRANIOTOMY N/A 12/26/2014   Procedure: CRANIOTOMY TUMOR EXCISION with BrainLab;  Surgeon: Kevan Ny Ditty, MD;  Location: [facility];  Service: Neurosurgery;  Laterality: N/A;  CRANIOTOMY TUMOR EXCISION with Stealth  . HERNIA REPAIR    . VASECTOMY    . VIDEO ASSISTED THORACOSCOPY (VATS)/WEDGE RESECTION Left 01/14/2015   Procedure: VIDEO ASSISTED THORACOSCOPY (VATS)/WEDGE RESECTION, Superior segmentectomy left  lower lobe, wedge resection of left upper lobe, multiple lymph node disection, On Q insertion.;  Surgeon: Grace Isaac, MD;  Location: [facility];  Service: Thoracic;  Laterality: Left;  Marland Kitchen VIDEO BRONCHOSCOPY Bilateral 12/16/2014    Procedure: VIDEO BRONCHOSCOPY WITH FLUORO;  Surgeon: Collene Gobble, MD;  Location: [facility];  Service: Cardiopulmonary;  Laterality: Bilateral;  . VIDEO BRONCHOSCOPY N/A 01/14/2015   Procedure: VIDEO BRONCHOSCOPY;  Surgeon: Grace Isaac, MD;  Location: [facility];  Service: Thoracic;  Laterality: N/A;    There were no vitals filed for this visit.  Subjective Assessment - 02/07/17 0854    Subjective  No changes. Was sore after last session. Especially rt calf    Pertinent History  December 2016:  initial c/o seizure, characterized as "right sided shaking"; this led to an MRI which demonstrated an enhancing mass consistent with metastatic adenocarcinoma. (treated with resection and chemo) MRI in July 2018 demonstrated possible progression or radiation necrosis in brain     Patient Stated Goals  Pt's goals for therapy to get RLE stronger and get rid of the weakness.                      [facility] Adult PT Treatment/Exercise - 02/07/17 0900      Transfers   Sit to Stand  6: Modified independent (Device/Increase time)    Number of Reps  10 reps;2 sets one foot on 4" step; rt and then lt      Ambulation/Gait   Ambulation/Gait Assistance  6: Modified independent (Device/Increase time)    Ambulation Distance (Feet)  200 Feet 350  Assistive device  None    Gait Pattern  Step-through pattern;Decreased step length - left;Decreased dorsiflexion - right;Right circumduction;Decreased trunk rotation;Decreased arm swing - right;Right flexed knee in stance;Right foot flat    Ambulation Surface  Indoor    Gait Comments  Attempted use of Rt AFOs (anterior plate vs posterior cuff) with slightly better clearance with posterior support ([place] walk-on) however did still drag rt foot intermittently; no tripping noted. Did not have time to try a plastic, increased stiffness AFO. Also gait trainging focusing on arm swing utilizing walking pole and PT behind him doing AAROM/arm swing; pt unable  to carrryover arm swing when PT stopped assisting.       Knee/Hip Exercises: Supine   Bridges  Strengthening;Both;1 set;5 reps 5 sec hold; then marching x 10 reps; leg ext x 20 3 sec hold             PT Education - 02/07/17 0944    Education provided  Yes    Education Details  additions to HEP; results of 6 MWT compared to 109-56 yo as unable to find 50-59 norms;     Person(s) Educated  Patient    Methods  Explanation;Demonstration;Handout    Comprehension  Verbalized understanding;Returned demonstration;Need further instruction          PT Long Term Goals - 02/03/17 1809      PT LONG TERM GOAL #1   Title  Pt will be independent with HEP for improved strength, balance, and gait.  TARGET 03/10/17    Time  5    Period  Weeks    Status  New      PT LONG TERM GOAL #2   Title  Pt will improve TUG score to less than or equal to 13.5 seconds for decreased fall risk.    Time  5    Period  Weeks    Status  New      PT LONG TERM GOAL #3   Title  6MWT to be assessed, with goal to be written as appropriate.    Baseline  02/03/17  1134 ft    Period  Weeks    Status  Achieved      PT LONG TERM GOAL #4   Title  Pt will ambulate at least 1000 ft, over indoor/outdoor surfaces, with appropriate device/orthotic for improved safety and efficiency with gait.    Time  5    Period  Weeks    Status  New      PT LONG TERM GOAL #5   Title  Patient will improve 6 MWT to >=1300 ft (closer to age-based norm) demonstrating improved quality/safety of gait pattern.     Time  5    Period  Weeks    Status  New            Plan - 02/07/17 0947    Clinical Impression Statement  Session with focus on additions to HEP for strengthening and muscular endurance, in addition to gait training with trials of Rt AFOs. Will request MD order for orthotist consult for Rt AFO. Patient will continue to benefit from PT.     Rehab Potential  Good    Clinical Impairments Affecting Rehab Potential  Hx of  cancer/mets    PT Frequency  2x / week    PT Duration  Other (comment) 5 weeks    PT Treatment/Interventions  ADLs/Self Care Home Management;Electrical Stimulation;DME Instruction;Gait training;Stair training;Functional mobility training;Therapeutic activities;Therapeutic exercise;Balance training;Orthotic Fit/Training;Patient/family education;Neuromuscular re-education  PT Next Visit Plan  Review and continue to update HEP to address RLE weakness, R ankle weakness; gait training with ?plastic (more supportive) AFO for foot drop; ? add forward/backward wt-shifting at counter for hip, knee extension at pushoff    Consulted and Agree with Plan of Care  Patient       Patient will benefit from skilled therapeutic intervention in order to improve the following deficits and impairments:  Abnormal gait, Decreased activity tolerance, Decreased balance, Decreased mobility, Difficulty walking, Decreased strength, Impaired flexibility, Impaired tone, Postural dysfunction  Visit Diagnosis: Other abnormalities of gait and mobility  Muscle weakness (generalized)     Problem List Patient Active Problem List   Diagnosis Date Noted  . Right leg weakness 01/12/2017  . Depression 02/11/2016  . Hypertension 12/31/2015  . Chronic fatigue 09/17/2015  . Shoulder pain, left 08/06/2015  . Encounter for antineoplastic immunotherapy 03/12/2015  . Malnutrition of moderate degree 01/17/2015  . S/P craniotomy 12/26/2014  . Brain metastasis ([facility]) 12/26/2014  . Primary cancer of left lower lobe of lung ([facility]) 12/22/2014  . Right sided weakness 12/12/2014  . Tobacco abuse     Rexanne Mano, PT 02/07/2017, 9:51 AM  [facility] [address] [place], [place], [address] Phone: [phone]   Fax:  [phone]  Name: JACUB WAITERS MRN: [id number] Date of Birth: Jan 31, 1960

## 2017-02-07 NOTE — Patient Instructions (Signed)
Bracing With March in Bridging (Hook-Lying)    With neutral spine, tighten abdominals and hold. Lift bottom and hold, then march in place. March _10-20__ times. Do _1__ times a day.   Copyright  VHI. All rights reserved.   Bridging: with Straight Leg Raise    With legs bent, lift buttocks _6-8___ inches from floor. Then slowly extend right knee, keeping stomach tight. Hold for 3-5 seconds. Put right foot down and extend left leg. Hold 3-5 seconds. Repeat __20__ times per set. Do __1__ sets per session. Do __1__ sessions per day.  http://orth.exer.us/1104   Copyright  VHI. All rights reserved.       WALKING  Walking is a great form of exercise to increase your strength, endurance and overall fitness.  A walking program can help you start slowly and gradually build endurance as you go.  Everyone's ability is different, so each person's starting point will be different.  You do not have to follow them exactly.  The are just samples. You should simply find out what's right for you and stick to that program.   In the beginning, you'll start off walking 2-3 times a day for short distances.  As you get stronger, you'll be walking further at just 1-2 times per day.  A. You Can Walk For A Certain Length Of Time Each Day    Walk 10 minutes 2 times per day.  Increase 1-2 minutes every 3-4 days   Work up to 15-63minutes (1-2 times per day). Then can begin one 30 minutes walk.     B. You Can Walk For a Certain Distance Each Day     Distance can be substituted for time.    Example:   3 trips to mailbox (at road)   3 trips to corner of block   3 trips around the block  C. Go to local [employer] and use the track.    Walk for distance ____ around track  Or time ____ minutes  Please only do the exercises that your therapist has initialed and dated

## 2017-02-10 ENCOUNTER — Ambulatory Visit (HOSPITAL_COMMUNITY)
Admission: RE | Admit: 2017-02-10 | Discharge: 2017-02-10 | Disposition: A | Discharge status: Home / Self Care | Payer: BLUE CROSS/BLUE SHIELD | Source: Ambulatory Visit | Attending: Internal Medicine | Admitting: Internal Medicine

## 2017-02-10 ENCOUNTER — Inpatient Hospital Stay: Payer: BLUE CROSS/BLUE SHIELD | Attending: Internal Medicine

## 2017-02-10 ENCOUNTER — Encounter (HOSPITAL_COMMUNITY): Payer: Self-pay

## 2017-02-10 DIAGNOSIS — C3490 Malignant neoplasm of unspecified part of unspecified bronchus or lung: Secondary | ICD-10-CM | POA: Diagnosis present

## 2017-02-10 DIAGNOSIS — Z9889 Other specified postprocedural states: Secondary | ICD-10-CM | POA: Diagnosis not present

## 2017-02-10 DIAGNOSIS — C3432 Malignant neoplasm of lower lobe, left bronchus or lung: Secondary | ICD-10-CM | POA: Insufficient documentation

## 2017-02-10 DIAGNOSIS — I700 Atherosclerosis of aorta: Secondary | ICD-10-CM | POA: Insufficient documentation

## 2017-02-10 DIAGNOSIS — J432 Centrilobular emphysema: Secondary | ICD-10-CM | POA: Insufficient documentation

## 2017-02-10 DIAGNOSIS — Z79899 Other long term (current) drug therapy: Secondary | ICD-10-CM | POA: Diagnosis not present

## 2017-02-10 DIAGNOSIS — Z85118 Personal history of other malignant neoplasm of bronchus and lung: Secondary | ICD-10-CM | POA: Insufficient documentation

## 2017-02-10 DIAGNOSIS — C771 Secondary and unspecified malignant neoplasm of intrathoracic lymph nodes: Secondary | ICD-10-CM | POA: Insufficient documentation

## 2017-02-10 DIAGNOSIS — G936 Cerebral edema: Secondary | ICD-10-CM | POA: Insufficient documentation

## 2017-02-10 DIAGNOSIS — C7931 Secondary malignant neoplasm of brain: Secondary | ICD-10-CM | POA: Diagnosis not present

## 2017-02-10 LAB — CBC WITH DIFFERENTIAL/PLATELET
BASOS ABS: 0 10*3/uL (ref 0.0–0.1)
Basophils Relative: 0 %
EOS ABS: 0 10*3/uL (ref 0.0–0.5)
Eosinophils Relative: 0 %
HCT: 48.5 % (ref 38.4–49.9)
HEMOGLOBIN: 16.2 g/dL (ref 13.0–17.1)
Lymphocytes Relative: 8 %
Lymphs Abs: 0.9 10*3/uL (ref 0.9–3.3)
MCH: 32.3 pg (ref 27.2–33.4)
MCHC: 33.4 g/dL (ref 32.0–36.0)
MCV: 96.8 fL (ref 79.3–98.0)
MONOS PCT: 5 %
Monocytes Absolute: 0.5 10*3/uL (ref 0.1–0.9)
NEUTROS ABS: 9.9 10*3/uL — AB (ref 1.5–6.5)
NEUTROS PCT: 87 %
Platelets: 302 10*3/uL (ref 140–400)
RBC: 5.01 MIL/uL (ref 4.20–5.82)
RDW: 13.9 % (ref 11.0–14.6)
WBC: 11.4 10*3/uL — AB (ref 4.0–10.3)

## 2017-02-10 LAB — COMPREHENSIVE METABOLIC PANEL
ALBUMIN: 3.9 g/dL (ref 3.5–5.0)
ALT: 18 U/L (ref 0–55)
AST: 21 U/L (ref 5–34)
Alkaline Phosphatase: 92 U/L (ref 40–150)
Anion gap: 12 — ABNORMAL HIGH (ref 3–11)
BILIRUBIN TOTAL: 0.3 mg/dL (ref 0.2–1.2)
BUN: 16 mg/dL (ref 7–26)
CO2: 26 mmol/L (ref 22–29)
CREATININE: 1.07 mg/dL (ref 0.70–1.30)
Calcium: 9.3 mg/dL (ref 8.4–10.4)
Chloride: 100 mmol/L (ref 98–109)
GFR calc Af Amer: >60 mL/min (ref >60)
GFR calc non Af Amer: >60 mL/min (ref >60)
GLUCOSE: 93 mg/dL (ref 70–140)
Potassium: 4.4 mmol/L (ref 3.5–5.1)
SODIUM: 138 mmol/L (ref 136–145)
Total Protein: 7.7 g/dL (ref 6.4–8.3)

## 2017-02-10 LAB — TSH: TSH: 0.631 u[IU]/mL (ref 0.320–4.118)

## 2017-02-10 MED ORDER — IOPAMIDOL (ISOVUE-300) INJECTION 61%
100.0000 mL | Freq: Once | INTRAVENOUS | Status: AC | PRN
  Administered 2017-02-10: 100 mL via INTRAVENOUS

## 2017-02-10 MED ORDER — IOPAMIDOL (ISOVUE-300) INJECTION 61%
INTRAVENOUS | Status: AC
  Administered 2017-02-10: 100 mL via INTRAVENOUS
  Filled 2017-02-10: qty 100

## 2017-02-13 ENCOUNTER — Encounter: Payer: Self-pay | Admitting: Internal Medicine

## 2017-02-13 ENCOUNTER — Telehealth: Payer: Self-pay | Admitting: Internal Medicine

## 2017-02-13 ENCOUNTER — Inpatient Hospital Stay (HOSPITAL_BASED_OUTPATIENT_CLINIC_OR_DEPARTMENT_OTHER): Payer: BLUE CROSS/BLUE SHIELD | Admitting: Internal Medicine

## 2017-02-13 DIAGNOSIS — C7931 Secondary malignant neoplasm of brain: Secondary | ICD-10-CM | POA: Diagnosis not present

## 2017-02-13 DIAGNOSIS — C3432 Malignant neoplasm of lower lobe, left bronchus or lung: Secondary | ICD-10-CM | POA: Diagnosis not present

## 2017-02-13 DIAGNOSIS — C771 Secondary and unspecified malignant neoplasm of intrathoracic lymph nodes: Secondary | ICD-10-CM | POA: Diagnosis not present

## 2017-02-13 DIAGNOSIS — G936 Cerebral edema: Secondary | ICD-10-CM

## 2017-02-13 DIAGNOSIS — C3490 Malignant neoplasm of unspecified part of unspecified bronchus or lung: Secondary | ICD-10-CM

## 2017-02-13 NOTE — Telephone Encounter (Signed)
Scheduled appt per 2/4 los - Gave patient AVS and calender per los - central radiology to contact patient with ct schedule.

## 2017-02-13 NOTE — Progress Notes (Signed)
    [facility] [phone]   [phone]  OFFICE PROGRESS NOTE  Obrien, Robert, MD [address] [place] [address]  DIAGNOSIS:  Stage IV (T2a, N1, M1b) non-small cell lung cancer, adenocarcinoma, negative EGFR mutation but equivocal EGFR amplification, negative ALK gene translocation and negative ROS 1 but with PDL-1 expression 100% presented with left lower lobe lung mass in addition to left hilar adenopathy and solitary metastatic brain lesion diagnosed in December 2016.  PRIOR THERAPY: 1) status post stereotactic radiotherapy and surgical resection diagnosed in December 2016. 2) status post video bronchoscopy with left VATS and wedge resection of the left upper lobe and left lower lobe superior segmentectomy with lymph node dissection under the care of Dr. Gerhardt on 01/14/2015. 3) First-line treatment with immunotherapy with Ketruda (pembrolizumab) 200 mg IV every 3 weeks status post 35 cycles. First cycle was given 02/18/2015.  Discontinued after the patient completed 2 years of treatment.  CURRENT THERAPY: Observation.  INTERVAL HISTORY: Timothy Obrien 56 y.o. male returns to the clinic today for follow-up visit accompanied by his ex-wife.  The patient is feeling fine today with no specific complaints.  He is currently on Decadron for mild vasogenic edema in the brain.  He denied having any current chest pain, shortness of breath, cough or hemoptysis.  He denied having any fever or chills.  He has no nausea, vomiting, diarrhea or constipation.  He completed 2 years of treatment with Keytruda.  The patient had repeat CT scan of the chest, abdomen and pelvis performed recently and he is here for evaluation and discussion of his discuss results.   MEDICAL HISTORY: Past Medical History:  Diagnosis Date  . Bronchitis   . Chronic fatigue 09/17/2015  . Depression 02/11/2016  . Hypertension 12/31/2015  . Lung cancer ([facility])    non small cell  lung ca with brain met  . Pneumonia   . Seizures ([facility])    last 12/11/14  . Shortness of breath dyspnea    with exertion  . Tobacco abuse     ALLERGIES:  has No Known Allergies.  MEDICATIONS:  Current Outpatient Medications  Medication Sig Dispense Refill  . ALPRAZolam (XANAX) 0.25 MG tablet Take 1 tablet by mouth daily as needed.    . dexamethasone (DECADRON) 4 MG tablet Take 1 tablet (4 mg total) by mouth daily. 30 tablet 3  . mirtazapine (REMERON) 30 MG tablet Take 1 tablet (30 mg total) by mouth at bedtime. 90 tablet 0  . pentoxifylline (TRENTAL) 400 MG CR tablet Take 1 tablet (400 mg total) by mouth 2 (two) times daily after a meal. 60 tablet 5  . sodium chloride 0.9 % SOLN 50 mL with pembrolizumab 100 MG/4ML SOLN 200 mg Inject 200 mg into the vein every 21 ( twenty-one) days. 12 Dose 0  . vitamin E 400 UNIT capsule Take 400 Units by mouth daily.     No current facility-administered medications for this visit.     SURGICAL HISTORY:  Past Surgical History:  Procedure Laterality Date  . APPLICATION OF CRANIAL NAVIGATION N/A 12/26/2014   Procedure: APPLICATION OF CRANIAL NAVIGATION;  Surgeon: Benjamin Jared Ditty, MD;  Location: [facility];  Service: Neurosurgery;  Laterality: N/A;  . CRANIOTOMY N/A 12/26/2014   Procedure: CRANIOTOMY TUMOR EXCISION with BrainLab;  Surgeon: Benjamin Jared Ditty, MD;  Location: [facility];  Service: Neurosurgery;  Laterality: N/A;  CRANIOTOMY TUMOR EXCISION with Stealth  . HERNIA REPAIR    .   VASECTOMY    . VIDEO ASSISTED THORACOSCOPY (VATS)/WEDGE RESECTION Left 01/14/2015   Procedure: VIDEO ASSISTED THORACOSCOPY (VATS)/WEDGE RESECTION, Superior segmentectomy left  lower lobe, wedge resection of left upper lobe, multiple lymph node disection, On Q insertion.;  Surgeon: Grace Isaac, MD;  Location: [facility];  Service: Thoracic;  Laterality: Left;  Marland Kitchen VIDEO BRONCHOSCOPY Bilateral 12/16/2014   Procedure: VIDEO BRONCHOSCOPY WITH FLUORO;  Surgeon:  Collene Gobble, MD;  Location: [facility];  Service: Cardiopulmonary;  Laterality: Bilateral;  . VIDEO BRONCHOSCOPY N/A 01/14/2015   Procedure: VIDEO BRONCHOSCOPY;  Surgeon: Grace Isaac, MD;  Location: [facility] OR;  Service: Thoracic;  Laterality: N/A;    REVIEW OF SYSTEMS:  Constitutional: negative Eyes: negative Ears, nose, mouth, throat, and face: negative Respiratory: negative Cardiovascular: negative Gastrointestinal: negative Genitourinary:negative Integument/breast: negative Hematologic/lymphatic: negative Musculoskeletal:negative Neurological: negative Behavioral/Psych: negative Endocrine: negative Allergic/Immunologic: negative   PHYSICAL EXAMINATION: General appearance: alert, cooperative and no distress Head: Normocephalic, without obvious abnormality, atraumatic Neck: no adenopathy, no JVD, supple, symmetrical, trachea midline and thyroid not enlarged, symmetric, no tenderness/mass/nodules Lymph nodes: Cervical, supraclavicular, and axillary nodes normal. Resp: clear to auscultation bilaterally Back: symmetric, no curvature. ROM normal. No CVA tenderness. Cardio: regular rate and rhythm, S1, S2 normal, no murmur, click, rub or gallop GI: soft, non-tender; bowel sounds normal; no masses,  no organomegaly Extremities: extremities normal, atraumatic, no cyanosis or edema Neurologic: Alert and oriented X 3, normal strength and tone. Normal symmetric reflexes. Normal coordination and gait  ECOG PERFORMANCE STATUS: 0 - Asymptomatic  Blood pressure 136/87, pulse 87, temperature 97.7 F (36.5 C), temperature source Oral, resp. rate 17, height 6' (1.829 m), weight 192 lb 3.2 oz (87.2 kg), SpO2 98 %.  LABORATORY DATA: Lab Results  Component Value Date   WBC 11.4 (H) 02/10/2017   HGB 16.2 02/10/2017   HCT 48.5 02/10/2017   MCV 96.8 02/10/2017   PLT 302 02/10/2017      Chemistry      Component Value Date/Time   NA 138 02/10/2017 0800   NA 137 01/12/2017 0846   K 4.4  02/10/2017 0800   K 4.4 01/12/2017 0846   CL 100 02/10/2017 0800   CO2 26 02/10/2017 0800   CO2 18 (L) 01/12/2017 0846   BUN 16 02/10/2017 0800   BUN 15.3 01/12/2017 0846   CREATININE 1.07 02/10/2017 0800   CREATININE 1.0 01/12/2017 0846      Component Value Date/Time   CALCIUM 9.3 02/10/2017 0800   CALCIUM 9.0 01/12/2017 0846   ALKPHOS 92 02/10/2017 0800   ALKPHOS 99 01/12/2017 0846   AST 21 02/10/2017 0800   AST 23 01/12/2017 0846   ALT 18 02/10/2017 0800   ALT 22 01/12/2017 0846   BILITOT 0.3 02/10/2017 0800   BILITOT 0.41 01/12/2017 0846       RADIOGRAPHIC STUDIES: Ct Chest W Contrast  Result Date: 02/10/2017 CLINICAL DATA:  Lung cancer brain metastasis. Radiation therapy complete. Immunotherapy in progress. Beryle Flock) EXAM: CT CHEST, ABDOMEN, AND PELVIS WITH CONTRAST TECHNIQUE: Multidetector CT imaging of the chest, abdomen and pelvis was performed following the standard protocol during bolus administration of intravenous contrast. CONTRAST:  100 mL Isovue COMPARISON:  CT 10/18/2016 FINDINGS: CT CHEST FINDINGS Cardiovascular: No significant vascular findings. Normal heart size. No pericardial effusion. Mediastinum/Nodes: Axillary supraclavicular adenopathy. No mediastinal effusion. Esophagus. Lungs/Pleura: Centrilobular emphysema the upper lobes. No suspicious pulmonary nodules. Post therapy change in the LEFT lower lobe. Musculoskeletal: No aggressive osseous lesion. CT ABDOMEN AND PELVIS FINDINGS Hepatobiliary: No  focal hepatic lesion. No biliary ductal dilatation. Gallbladder is normal. Common bile duct is normal. Pancreas: Pancreas is normal. No ductal dilatation. No pancreatic inflammation. Spleen: Normal spleen Adrenals/urinary tract: Adrenal glands and kidneys are normal. The ureters and bladder normal. Stomach/Bowel: Stomach, small bowel, appendix, and cecum are normal. The colon and rectosigmoid colon are normal. Vascular/Lymphatic: Abdominal aorta is normal caliber with  atherosclerotic calcification. There is no retroperitoneal or periportal lymphadenopathy. No pelvic lymphadenopathy. Reproductive: Prostate normal Other: No peritoneal nodularity Musculoskeletal: No aggressive osseous lesion. IMPRESSION: Chest Impression: 1. Post therapy change in the LEFT lung without evidence local recurrence. 2. Centrilobular emphysema. Abdomen / Pelvis Impression: 1.  No evidence of metastatic disease in the abdomen pelvis. 2. Aortic Atherosclerosis (ICD10-I70.0) and Emphysema (ICD10-J43.9). Electronically Signed   By: Suzy Bouchard M.D.   On: 02/10/2017 09:47   Ct Abdomen Pelvis W Contrast  Result Date: 02/10/2017 CLINICAL DATA:  Lung cancer brain metastasis. Radiation therapy complete. Immunotherapy in progress. Beryle Flock) EXAM: CT CHEST, ABDOMEN, AND PELVIS WITH CONTRAST TECHNIQUE: Multidetector CT imaging of the chest, abdomen and pelvis was performed following the standard protocol during bolus administration of intravenous contrast. CONTRAST:  100 mL Isovue COMPARISON:  CT 10/18/2016 FINDINGS: CT CHEST FINDINGS Cardiovascular: No significant vascular findings. Normal heart size. No pericardial effusion. Mediastinum/Nodes: Axillary supraclavicular adenopathy. No mediastinal effusion. Esophagus. Lungs/Pleura: Centrilobular emphysema the upper lobes. No suspicious pulmonary nodules. Post therapy change in the LEFT lower lobe. Musculoskeletal: No aggressive osseous lesion. CT ABDOMEN AND PELVIS FINDINGS Hepatobiliary: No focal hepatic lesion. No biliary ductal dilatation. Gallbladder is normal. Common bile duct is normal. Pancreas: Pancreas is normal. No ductal dilatation. No pancreatic inflammation. Spleen: Normal spleen Adrenals/urinary tract: Adrenal glands and kidneys are normal. The ureters and bladder normal. Stomach/Bowel: Stomach, small bowel, appendix, and cecum are normal. The colon and rectosigmoid colon are normal. Vascular/Lymphatic: Abdominal aorta is normal caliber with  atherosclerotic calcification. There is no retroperitoneal or periportal lymphadenopathy. No pelvic lymphadenopathy. Reproductive: Prostate normal Other: No peritoneal nodularity Musculoskeletal: No aggressive osseous lesion. IMPRESSION: Chest Impression: 1. Post therapy change in the LEFT lung without evidence local recurrence. 2. Centrilobular emphysema. Abdomen / Pelvis Impression: 1.  No evidence of metastatic disease in the abdomen pelvis. 2. Aortic Atherosclerosis (ICD10-I70.0) and Emphysema (ICD10-J43.9). Electronically Signed   By: Suzy Bouchard M.D.   On: 02/10/2017 09:47    ASSESSMENT AND PLAN:  This is a very pleasant 57 years old white male with metastatic non-small cell lung cancer, adenocarcinoma with positive PDL 1 expression of 100%. He completed treatment with Ketruda (pembrolizumab) 200 mg IV every 3 weeks, status post 35 cycles, a total of 2 years. He had repeat CT scan of the chest, abdomen and pelvis performed recently.  I personally and independently reviewed the scans and discussed the results with the patient and his wife. His scan showed no concerning findings for disease recurrence or progression. I recommended for the patient to continue on observation with repeat CT scan of the chest, abdomen and pelvis in 2 years. For the vasogenic edema, he is currently on a taper dose of Decadron by radiation oncology. The patient was advised to call immediately if he has any concerning symptoms in the interval. The patient voices understanding of current disease status and treatment options and is in agreement with the current care plan. All questions were answered. The patient knows to call the clinic with any problems, questions or concerns. We can certainly see the patient much sooner if necessary. I spent  15 minutes counseling the patient face to face. The total time spent in the appointment was 25 minutes.  Disclaimer: This note was dictated with voice recognition software.  Similar sounding words can inadvertently be transcribed and may not be corrected upon review.

## 2017-02-14 ENCOUNTER — Encounter: Payer: Self-pay | Admitting: Physical Therapy

## 2017-02-14 ENCOUNTER — Ambulatory Visit: Payer: BLUE CROSS/BLUE SHIELD | Attending: Internal Medicine | Admitting: Physical Therapy

## 2017-02-14 DIAGNOSIS — R2681 Unsteadiness on feet: Secondary | ICD-10-CM | POA: Insufficient documentation

## 2017-02-14 DIAGNOSIS — R2689 Other abnormalities of gait and mobility: Secondary | ICD-10-CM | POA: Diagnosis present

## 2017-02-14 DIAGNOSIS — M6281 Muscle weakness (generalized): Secondary | ICD-10-CM | POA: Diagnosis not present

## 2017-02-14 NOTE — Therapy (Signed)
[facility] [address] [place], [place], [address] Phone: [phone]   Fax:  [phone]  Physical Therapy Treatment  Patient Details  Name: Timothy Obrien MRN: [id number] Date of Birth: 07-14-1960 Referring Provider: Rhae Hammock, RN   Encounter Date: 02/14/2017  PT End of Session - 02/14/17 2017    Visit Number  4    Number of Visits  10    Date for PT Re-Evaluation  04/03/17    Authorization Type  BCBS    PT Start Time  0848    PT Stop Time  0931    PT Time Calculation (min)  43 min    Activity Tolerance  Patient tolerated treatment well    Behavior During Therapy  [facility] for tasks assessed/performed       Past Medical History:  Diagnosis Date  . Bronchitis   . Chronic fatigue 09/17/2015  . Depression 02/11/2016  . Hypertension 12/31/2015  . Lung cancer ([place])    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  . Pneumonia   . Seizures ([place])    last 12/11/14  . Shortness of breath dyspnea    with exertion  . Tobacco abuse     Past Surgical History:  Procedure Laterality Date  . APPLICATION OF CRANIAL NAVIGATION N/A 12/26/2014   Procedure: APPLICATION OF CRANIAL NAVIGATION;  Surgeon: Kevan Ny Ditty, MD;  Location: [place] [facility];  Service: Neurosurgery;  Laterality: N/A;  . CRANIOTOMY N/A 12/26/2014   Procedure: CRANIOTOMY TUMOR EXCISION with BrainLab;  Surgeon: Kevan Ny Ditty, MD;  Location: [place] [facility];  Service: Neurosurgery;  Laterality: N/A;  CRANIOTOMY TUMOR EXCISION with Stealth  . HERNIA REPAIR    . VASECTOMY    . VIDEO ASSISTED THORACOSCOPY (VATS)/WEDGE RESECTION Left 01/14/2015   Procedure: VIDEO ASSISTED THORACOSCOPY (VATS)/WEDGE RESECTION, Superior segmentectomy left  lower lobe, wedge resection of left upper lobe, multiple lymph node disection, On Q insertion.;  Surgeon: Grace Isaac, MD;  Location: [facility];  Service: Thoracic;  Laterality: Left;  Marland Kitchen VIDEO BRONCHOSCOPY Bilateral 12/16/2014    Procedure: VIDEO BRONCHOSCOPY WITH FLUORO;  Surgeon: Collene Gobble, MD;  Location: [facility];  Service: Cardiopulmonary;  Laterality: Bilateral;  . VIDEO BRONCHOSCOPY N/A 01/14/2015   Procedure: VIDEO BRONCHOSCOPY;  Surgeon: Grace Isaac, MD;  Location: [facility] OR;  Service: Thoracic;  Laterality: N/A;    There were no vitals filed for this visit.  Subjective Assessment - 02/14/17 0851    Subjective  No changes, nothing new since last visit.    Pertinent History  December 2016:  initial c/o seizure, characterized as "right sided shaking"; this led to an MRI which demonstrated an enhancing mass consistent with metastatic adenocarcinoma. (treated with resection and chemo) MRI in July 2018 demonstrated possible progression or radiation necrosis in brain     Patient Stated Goals  Pt's goals for therapy to get RLE stronger and get rid of the weakness.    Currently in Pain?  No/denies                      [facility] Adult PT Treatment/Exercise - 02/14/17 0001      Ambulation/Gait   Ambulation/Gait  Yes    Ambulation/Gait Assistance  6: Modified independent (Device/Increase time)    Ambulation Distance (Feet)  200 Feet 115, then 200 ft    Assistive device  None    Gait Pattern  Step-through pattern;Decreased step length - left;Decreased dorsiflexion - right;Right circumduction;Decreased  trunk rotation;Decreased arm swing - right;Right flexed knee in stance;Right foot flat    Ambulation Surface  Level;Indoor    Pre-Gait Activities  Stagger stance position with forward/back weightshifting 10 reps each leg position, with cues for anterior hip excursion and full weightbearing through forward positioned leg.      Gait Comments  Attempted use of plastic, PLS-type AFO, with pt noting discomfort, slightly increased foot clearance and heelstrike, but stil pronounced foot slap noted with gait.  Trialed gait with no AFO, then trialed gait x at least 200 ft using blue theraband placed to simulate  AFO, with improved foot clearance, heelstrike.  Pt continues to take decreased step lenght and have decreased R arm swing.      Knee/Hip Exercises: Standing   Wall Squat  1 set;10 reps      Knee/Hip Exercises: Supine   Bridges  Strengthening;Both;1 set;10 reps then with SLR 2 sets x 10-review of HEP    Other Supine Knee/Hip Exercises  Bridging with hooklying marching x 10 reps-review of HEP last visit      Ankle Exercises: Seated   Other Seated Ankle Exercises  Heel/toe raises x 10 reps      Ankle Exercises: Stretches   Gastroc Stretch  2 reps;30 seconds RLE, using belt    Other Stretch  Standing runner's stretch, 2 x 30 seconds each leg      Ankle Exercises: Standing   Heel Raises  10 reps;3 seconds    Toe Raise  10 reps;3 seconds       Additional pre-gait activities:  Forward>back step and weightshift x 15 reps each leg, for improved step length, foot clearance, and improved push-off to initiate swing through      PT Education - 02/14/17 2016    Education provided  Yes    Education Details  Additions to [employer] instructions    Person(s) Educated  Patient    Methods  Explanation;Demonstration;Handout    Comprehension  Verbalized understanding;Returned demonstration          PT Long Term Goals - 02/03/17 1809      PT LONG TERM GOAL #1   Title  Pt will be independent with HEP for improved strength, balance, and gait.  TARGET 03/10/17    Time  5    Period  Weeks    Status  New      PT LONG TERM GOAL #2   Title  Pt will improve TUG score to less than or equal to 13.5 seconds for decreased fall risk.    Time  5    Period  Weeks    Status  New      PT LONG TERM GOAL #3   Title  6MWT to be assessed, with goal to be written as appropriate.    Baseline  02/03/17  1134 ft    Period  Weeks    Status  Achieved      PT LONG TERM GOAL #4   Title  Pt will ambulate at least 1000 ft, over indoor/outdoor surfaces, with appropriate device/orthotic for improved safety and  efficiency with gait.    Time  5    Period  Weeks    Status  New      PT LONG TERM GOAL #5   Title  Patient will improve 6 MWT to >=1300 ft (closer to age-based norm) demonstrating improved quality/safety of gait pattern.     Time  5    Period  Weeks    Status  New            Plan - 02/14/17 2018    Clinical Impression Statement  Continued to focus on RLE strengthening as well as gait training with AFO trials.  With plastic PLS type AFO, pt has only slightly improved foot clearance and notes discomfort; with theraband simulating AFO, pt noted to have improved foot clearance, heelstrike, but still decreased R step length and decreased R arm swing/trunk rotation.  Will likely benefit from orthotic consult to assist with best option for potential of AFO to assist foot clearance, heelstrike and decrease foot slap.    Rehab Potential  Good    Clinical Impairments Affecting Rehab Potential  Hx of cancer/mets    PT Frequency  2x / week    PT Duration  Other (comment) 5 weeks    PT Treatment/Interventions  ADLs/Self Care Home Management;Electrical Stimulation;DME Instruction;Gait training;Stair training;Functional mobility training;Therapeutic activities;Therapeutic exercise;Balance training;Orthotic Fit/Training;Patient/family education;Neuromuscular re-education    PT Next Visit Plan  Continue to work on RLE and R ankle weakness, gait training with simulated (theraband) AFO, working on push-off and swing through phase of gait    Consulted and Agree with Plan of Care  Patient       Patient will benefit from skilled therapeutic intervention in order to improve the following deficits and impairments:  Abnormal gait, Decreased activity tolerance, Decreased balance, Decreased mobility, Difficulty walking, Decreased strength, Impaired flexibility, Impaired tone, Postural dysfunction  Visit Diagnosis: Muscle weakness (generalized)  Other abnormalities of gait and mobility     Problem  List Patient Active Problem List   Diagnosis Date Noted  . Right leg weakness 01/12/2017  . Depression 02/11/2016  . Hypertension 12/31/2015  . Chronic fatigue 09/17/2015  . Shoulder pain, left 08/06/2015  . Encounter for antineoplastic immunotherapy 03/12/2015  . Malnutrition of moderate degree 01/17/2015  . S/P craniotomy 12/26/2014  . Brain metastasis ([place]) 12/26/2014  . Primary cancer of left lower lobe of lung ([place]) 12/22/2014  . Right sided weakness 12/12/2014  . Tobacco abuse     MARRIOTT,AMY W. 02/14/2017, 8:23 PM  Frazier Butt., PT   [facility] [place] [address] [place], [place], [address] Phone: [phone]   Fax:  [phone]  Name: Timothy Obrien MRN: [id number] Date of Birth: December 16, 1960

## 2017-02-14 NOTE — Patient Instructions (Addendum)
Toe / Heel Raise    Gently rock back on heels  and raise toes for 3 seconds. Then rock forward on toes and raise heels for 3 seconds. Repeat sequence __2 sets of 10__ times per session. Do _3-5___ sessions per week.  Copyright  VHI. All rights reserved.  HIP / KNEE: Flexion / Extension, Wall Squat    Stance: shoulder-width on floor, against wall. Place feet in front of hips. Bend hips and knees. Keep back straight and keep back on wall. Do not allow knees to bend past toes. Squeeze glutes and quads to stand. _2 sets of 10__ reps per set, _3-5__ days per week  Copyright  VHI. All rights reserved.

## 2017-02-15 ENCOUNTER — Telehealth: Payer: Self-pay | Admitting: Physical Therapy

## 2017-02-15 NOTE — Telephone Encounter (Signed)
I am the physical therapist who evaluated and has been seeing Timothy Obrien.  We have trialed several orthotic options in our treatment sessions, but he may benefit from an orthotic consult.  Could you please write order for R AFO, Orthotic consult, please?  This will allow [place] to collaborate with an orthotist on the best option for his foot drag.  Thank you.  Mady Haagensen, PT Mady Haagensen, [place] 02/15/17 12:37 PM Phone: [phone] Fax: [phone]

## 2017-02-16 ENCOUNTER — Other Ambulatory Visit: Payer: Self-pay | Admitting: *Deleted

## 2017-02-16 DIAGNOSIS — R29898 Other symptoms and signs involving the musculoskeletal system: Secondary | ICD-10-CM

## 2017-02-16 DIAGNOSIS — M21371 Foot drop, right foot: Secondary | ICD-10-CM

## 2017-02-17 ENCOUNTER — Ambulatory Visit: Payer: BLUE CROSS/BLUE SHIELD | Admitting: Physical Therapy

## 2017-02-22 ENCOUNTER — Encounter: Payer: Self-pay | Admitting: Physical Therapy

## 2017-02-22 ENCOUNTER — Ambulatory Visit: Payer: BLUE CROSS/BLUE SHIELD | Admitting: Physical Therapy

## 2017-02-22 DIAGNOSIS — R2689 Other abnormalities of gait and mobility: Secondary | ICD-10-CM

## 2017-02-22 DIAGNOSIS — M6281 Muscle weakness (generalized): Secondary | ICD-10-CM

## 2017-02-22 DIAGNOSIS — R2681 Unsteadiness on feet: Secondary | ICD-10-CM

## 2017-02-22 NOTE — Therapy (Signed)
[facility] [address] [place], [place], [address] Phone: [phone]   Fax:  [phone]  Physical Therapy Treatment  Patient Details  Name: SHO SALGUERO MRN: [id number] Date of Birth: 1960-11-01 Referring Provider: Rhae Hammock, RN   Encounter Date: 02/22/2017  PT End of Session - 02/22/17 1515    Visit Number  5    Number of Visits  10    Date for PT Re-Evaluation  04/03/17    Authorization Type  BCBS    PT Start Time  0848    PT Stop Time  0931    PT Time Calculation (min)  43 min    Activity Tolerance  Patient tolerated treatment well    Behavior During Therapy  [facility] for tasks assessed/performed       Past Medical History:  Diagnosis Date  . Bronchitis   . Chronic fatigue 09/17/2015  . Depression 02/11/2016  . Hypertension 12/31/2015  . Lung cancer ([place])    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  . Pneumonia   . Seizures ([place])    last 12/11/14  . Shortness of breath dyspnea    with exertion  . Tobacco abuse     Past Surgical History:  Procedure Laterality Date  . APPLICATION OF CRANIAL NAVIGATION N/A 12/26/2014   Procedure: APPLICATION OF CRANIAL NAVIGATION;  Surgeon: Kevan Ny Ditty, MD;  Location: [facility];  Service: Neurosurgery;  Laterality: N/A;  . CRANIOTOMY N/A 12/26/2014   Procedure: CRANIOTOMY TUMOR EXCISION with BrainLab;  Surgeon: Kevan Ny Ditty, MD;  Location: [facility] NEUR[facility] ORS;  Service: Neurosurgery;  Laterality: N/A;  CRANIOTOMY TUMOR EXCISION with Stealth  . HERNIA REPAIR    . VASECTOMY    . VIDEO ASSISTED THORACOSCOPY (VATS)/WEDGE RESECTION Left 01/14/2015   Procedure: VIDEO ASSISTED THORACOSCOPY (VATS)/WEDGE RESECTION, Superior segmentectomy left  lower lobe, wedge resection of left upper lobe, multiple lymph node disection, On Q insertion.;  Surgeon: Grace Isaac, MD;  Location: [facility];  Service: Thoracic;  Laterality: Left;  Marland Kitchen VIDEO BRONCHOSCOPY Bilateral 12/16/2014    Procedure: VIDEO BRONCHOSCOPY WITH FLUORO;  Surgeon: Collene Gobble, MD;  Location: [facility];  Service: Cardiopulmonary;  Laterality: Bilateral;  . VIDEO BRONCHOSCOPY N/A 01/14/2015   Procedure: VIDEO BRONCHOSCOPY;  Surgeon: Grace Isaac, MD;  Location: [facility] OR;  Service: Thoracic;  Laterality: N/A;    There were no vitals filed for this visit.  Subjective Assessment - 02/22/17 0850    Subjective  Feels he is not improving. Is still taking Decadron. MRI got moved to end of March. Asked to decrease to one time per week so he can work on strengthening more between his appointments. Agreeable to meet with orthotist to see what they can offer to improve his walking.     Pertinent History  December 2016:  initial c/o seizure, characterized as "right sided shaking"; this led to an MRI which demonstrated an enhancing mass consistent with metastatic adenocarcinoma. (treated with resection and chemo) MRI in July 2018 demonstrated possible progression or radiation necrosis in brain     Patient Stated Goals  Pt's goals for therapy to get RLE stronger and get rid of the weakness.    Currently in Pain?  No/denies                      [facility] Adult PT Treatment/Exercise - 02/22/17 1500      Ambulation/Gait   Ambulation/Gait  Yes    Ambulation/Gait  Assistance  5: Supervision;6: Modified independent (Device/Increase time)    Ambulation/Gait Assistance Details  on arrival, pt with increased rt steppage gait with RUE in slight flexion; gait appears very effortful; with clinic AFO (plastic, back width 4-5 cm with reinforced plastic for extra strength), pt with improved foot clearance, heelstrike and control to foot flat; after pre-gait activities with AFO, gait improved even more    Ambulation Distance (Feet)  100 Feet 400    Assistive device  None    Gait Pattern  Step-through pattern;Decreased step length - left;Decreased dorsiflexion - right;Right circumduction;Decreased trunk  rotation;Decreased arm swing - right;Right flexed knee in stance;Right foot flat;Decreased step length - right;Poor foot clearance - right    Ambulation Surface  Level;Indoor    Pre-Gait Activities  Stagger stance position with forward/back weightshifting 10 reps each leg position, with emphasis on anterior wt shift over fully extended stance leg; progressed to adding a second step and then step back x 10 each leg      Exercises   Exercises  Other Exercises    Other Exercises   Discussed exercises take lots of repetitions to try to change the brain              PT Education - 02/22/17 1513    Education provided  Yes    Education Details  see addition to HEP; education re: neuroplasticity and high number of reps with correct motor patterns to elicit change; discussed pro's and con's of rt AFO and purpose of orthotist consult (not committed to getting an AFO, but explore options)    Person(s) Educated  Patient    Methods  Explanation;Demonstration;Handout    Comprehension  Verbalized understanding;Returned demonstration;Need further instruction          PT Long Term Goals - 02/22/17 1524      PT LONG TERM GOAL #1   Title  Pt will be independent with HEP for improved strength, balance, and gait.  TARGET 03/10/17 (updated to 03/31/17)    Time  5    Period  Weeks    Status  New    Target Date  03/31/17      PT LONG TERM GOAL #2   Title  Pt will improve TUG score to less than or equal to 13.5 seconds for decreased fall risk.    Time  5    Period  Weeks    Status  New      PT LONG TERM GOAL #3   Title  6MWT to be assessed, with goal to be written as appropriate.    Baseline  02/03/17  1134 ft    Period  Weeks    Status  Achieved      PT LONG TERM GOAL #4   Title  Pt will ambulate at least 1000 ft, over indoor/outdoor surfaces, with appropriate device/orthotic for improved safety and efficiency with gait.    Time  5    Period  Weeks    Status  New      PT LONG TERM GOAL #5    Title  Patient will improve 6 MWT to >=1300 ft (closer to age-based norm) demonstrating improved quality/safety of gait pattern.     Time  5    Period  Weeks    Status  New            Plan - 02/22/17 1515    Clinical Impression Statement  Time spent answering patient's questions re: possible AFO, his HEP, and neuroplasticity.  Trialed a more rigid platic (off the shelf) AFO with best success at improving his rt foot clearance and reduction in effort of walking. Patient acknowledged that he spends a lot of energy concentrating on how to advance RLE to prevent tripping and falling. Patient agreeable to move forward with an orthotist consult to explore options for an AFO. He had no experience or preference for orthotist to use and chose [facility]. MD order, MD progress note, and pt information faxed to [facility]. Message left for Rosey Bath to try to arrange a meeting at [facility]. Patient with 5 scheduled appointments remaining and wants to spread the appointments out to 1x/week over 5 weeks. This will allow time for orthotist consult and hopeful delivery of AFO with time for gait training. New/updated certification sent to MD.     Rehab Potential  Good    Clinical Impairments Affecting Rehab Potential  Hx of cancer/mets    PT Frequency  1x / week decreased to 1x/wk 02/22/17    PT Duration  Other (comment) 5 weeks    PT Treatment/Interventions  ADLs/Self Care Home Management;DME Instruction;Gait training;Stair training;Functional mobility training;Therapeutic activities;Therapeutic exercise;Balance training;Orthotic Fit/Training;Patient/family education;Neuromuscular re-education    PT Next Visit Plan  Continue to work on RLE and R ankle weakness, gait training with simulated (theraband) AFO, working on push-off and swing through phase of gait; update pt on status of appt with [facility]    Consulted and Agree with Plan of Care  Patient       Patient will benefit from skilled therapeutic intervention  in order to improve the following deficits and impairments:  Abnormal gait, Decreased activity tolerance, Decreased balance, Decreased mobility, Difficulty walking, Decreased strength, Impaired flexibility, Impaired tone, Postural dysfunction, Decreased endurance  Visit Diagnosis: Muscle weakness (generalized) - Plan: PT plan of care cert/re-cert  Other abnormalities of gait and mobility - Plan: PT plan of care cert/re-cert  Unsteadiness on feet - Plan: PT plan of care cert/re-cert     Problem List Patient Active Problem List   Diagnosis Date Noted  . Right leg weakness 01/12/2017  . Depression 02/11/2016  . Hypertension 12/31/2015  . Chronic fatigue 09/17/2015  . Shoulder pain, left 08/06/2015  . Encounter for antineoplastic immunotherapy 03/12/2015  . Malnutrition of moderate degree 01/17/2015  . S/P craniotomy 12/26/2014  . Brain metastasis ([place]) 12/26/2014  . Primary cancer of left lower lobe of lung ([place]) 12/22/2014  . Right sided weakness 12/12/2014  . Tobacco abuse     Rexanne Mano, PT 02/22/2017, 3:32 PM  [facility] [address] [place], [place], [address] Phone: [phone]   Fax:  [phone]  Name: KIMO BANCROFT MRN: [id number] Date of Birth: 04/30/1960

## 2017-02-22 NOTE — Patient Instructions (Signed)
   Forward Weight Shift    Stand with upright posture. Left leg in front of right. Shift weight forward onto left leg, straighten hip and knee "standing tall." Shift weight back onto right leg.  __10_ reps per set, _3__ sets per day, __5_ days per week. Hold onto a support (chair or countertop). Repeat with other leg.   As you feel comfortable, can progress to adding a second step and stand tall and then back.  Copyright  VHI. All rights reserved.

## 2017-02-24 ENCOUNTER — Ambulatory Visit: Payer: BLUE CROSS/BLUE SHIELD | Admitting: Physical Therapy

## 2017-03-01 ENCOUNTER — Ambulatory Visit: Payer: BLUE CROSS/BLUE SHIELD | Admitting: Physical Therapy

## 2017-03-01 DIAGNOSIS — M6281 Muscle weakness (generalized): Secondary | ICD-10-CM

## 2017-03-01 DIAGNOSIS — R2689 Other abnormalities of gait and mobility: Secondary | ICD-10-CM

## 2017-03-01 NOTE — Therapy (Signed)
[facility] [address] [place], [place], [address] Phone: [phone]   Fax:  [phone]  Physical Therapy Treatment  Patient Details  Name: Timothy Obrien MRN: [id number] Date of Birth: 12-22-1960 Referring Provider: Rhae Hammock, RN   Encounter Date: 03/01/2017  PT End of Session - 03/01/17 1725    Visit Number  6    Number of Visits  10    Date for PT Re-Evaluation  04/03/17    Authorization Type  BCBS    PT Start Time  0848    PT Stop Time  0932    PT Time Calculation (min)  44 min    Activity Tolerance  Patient tolerated treatment well    Behavior During Therapy  [facility] for tasks assessed/performed       Past Medical History:  Diagnosis Date  . Bronchitis   . Chronic fatigue 09/17/2015  . Depression 02/11/2016  . Hypertension 12/31/2015  . Lung cancer ([place])    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  . Pneumonia   . Seizures ([place])    last 12/11/14  . Shortness of breath dyspnea    with exertion  . Tobacco abuse     Past Surgical History:  Procedure Laterality Date  . APPLICATION OF CRANIAL NAVIGATION N/A 12/26/2014   Procedure: APPLICATION OF CRANIAL NAVIGATION;  Surgeon: Kevan Ny Ditty, MD;  Location: [facility];  Service: Neurosurgery;  Laterality: N/A;  . CRANIOTOMY N/A 12/26/2014   Procedure: CRANIOTOMY TUMOR EXCISION with BrainLab;  Surgeon: Kevan Ny Ditty, MD;  Location: [facility];  Service: Neurosurgery;  Laterality: N/A;  CRANIOTOMY TUMOR EXCISION with Stealth  . HERNIA REPAIR    . VASECTOMY    . VIDEO ASSISTED THORACOSCOPY (VATS)/WEDGE RESECTION Left 01/14/2015   Procedure: VIDEO ASSISTED THORACOSCOPY (VATS)/WEDGE RESECTION, Superior segmentectomy left  lower lobe, wedge resection of left upper lobe, multiple lymph node disection, On Q insertion.;  Surgeon: Grace Isaac, MD;  Location: [facility];  Service: Thoracic;  Laterality: Left;  Marland Kitchen VIDEO BRONCHOSCOPY Bilateral 12/16/2014    Procedure: VIDEO BRONCHOSCOPY WITH FLUORO;  Surgeon: Collene Gobble, MD;  Location: [facility];  Service: Cardiopulmonary;  Laterality: Bilateral;  . VIDEO BRONCHOSCOPY N/A 01/14/2015   Procedure: VIDEO BRONCHOSCOPY;  Surgeon: Grace Isaac, MD;  Location[facility];  Service: Thoracic;  Laterality: N/A;    There were no vitals filed for this visit.  Subjective Assessment - 03/01/17 0855    Subjective  No changes since last visit    Pertinent History  December 2016:  initial c/o seizure, characterized as "right sided shaking"; this led to an MRI which demonstrated an enhancing mass consistent with metastatic adenocarcinoma. (treated with resection and chemo) MRI in July 2018 demonstrated possible progression or radiation necrosis in brain     Patient Stated Goals  Pt's goals for therapy to get RLE stronger and get rid of the weakness.    Currently in Pain?  No/denies                      [facility] Adult PT Treatment/Exercise - 03/01/17 0001      Ambulation/Gait   Ambulation/Gait  Yes    Ambulation/Gait Assistance  6: Modified independent (Device/Increase time)    Ambulation/Gait Assistance Details  Trial of Walk-On brace x 230 ft x 2, with improved foot clearance RLE.  Pt does need cues for timing of R hip flexion for initiation of stance phase.  Trial of blue rocker x 115 ft, with pt reporting blue rocker more confining and not noting real improvement of foot clearance.   After pre-gait activities below, additional 460 ft of gait training with Walk-On AFO on RLE, with pt noted to have "lurch" on RLE, tending to keep R knee flexed in stance.    Ambulation Distance (Feet)  230 Feet x 2, then 115    Assistive device  None    Gait Pattern  Step-through pattern;Decreased step length - left;Decreased dorsiflexion - right;Right circumduction;Decreased trunk rotation;Decreased arm swing - right;Right flexed knee in stance;Right foot flat;Decreased step length - right;Poor foot clearance  - right R foot clearance improved with Walk-On brace    Ambulation Surface  Level;Indoor    Pre-Gait Activities  While wearing Walk-On AFO, activities to promote R hip flexion for improved timing of R swing through:  marching in place x 20 reps, then forward step and weightshift<>back step and weightshift x 10 reps, with cues to facilitate anterior weightshift through R hip to encourage terminal knee extension in stance.  Forward step taps to 6" step, then back reach (runner's stretch position) on RLE, x 10 reps, with Clonus noted first 3 reps of forward step taps.    Gait Comments  [name], orthotist from [facility] present during initial portion of PT session today, to make recommendations on appropriate AFO for patient to help improve R foot clearance, heelstrike with gait.  After initial gait with no AFO, orthotist recommends trial of Walk-On AFO on RLE, which does improve R foot clearance (PT and patient did ask about concern of timing of L hip flexion with stance phase, but pt is able to initially correct this with cues.  Orthotist recommends pt come to office (after PT session) for further orthotic work-up and recommendations.              PT Education - 03/01/17 1724    Education provided  Yes    Education Details  options for AFO; rationale for Walk-On AFO, including light weight, and ability to assist foot clearance; discussed (and pt agreed) to orthotist appointment in clinic following PT, for further work-up and recommendations    Person(s) Educated  Patient    Methods  Explanation;Demonstration    Comprehension  Verbalized understanding          PT Long Term Goals - 02/22/17 1524      PT LONG TERM GOAL #1   Title  Pt will be independent with HEP for improved strength, balance, and gait.  TARGET 03/10/17 (updated to 03/31/17)    Time  5    Period  Weeks    Status  New    Target Date  03/31/17      PT LONG TERM GOAL #2   Title  Pt will improve TUG score to less than or equal to  13.5 seconds for decreased fall risk.    Time  5    Period  Weeks    Status  New      PT LONG TERM GOAL #3   Title  6MWT to be assessed, with goal to be written as appropriate.    Baseline  02/03/17  1134 ft    Period  Weeks    Status  Achieved      PT LONG TERM GOAL #4   Title  Pt will ambulate at least 1000 ft, over indoor/outdoor surfaces, with appropriate device/orthotic for improved safety and efficiency with gait.    Time  5    Period  Weeks    Status  New      PT LONG TERM GOAL #5   Title  Patient will improve 6 MWT to >=1300 ft (closer to age-based norm) demonstrating improved quality/safety of gait pattern.     Time  5    Period  Weeks    Status  New            Plan - 03/01/17 1726    Clinical Impression Statement  Orthotist present this visit to assist with trial of AFOs and recommendations for AFO to assist with foot clearance for more normalized gait pattern.  With trials today (in addition to previous trials), orthotist recommends Walk-On AFO as best option for dorsiflexion assist and lightweight.  With gait trials, it does improve R foot clearance, though cues are needed for timing and coordination to improved bringing R hip through swing phase and attention to heelstrike.  However, by session's end, pt has difficulty with terminal R knee extension in stance phase with gait; unsure if this is fatigue/discoordination or if this is related to AFO.  He is planning to go to orthotist office following PT visit for further recommendations.    Rehab Potential  Good    Clinical Impairments Affecting Rehab Potential  Hx of cancer/mets    PT Frequency  1x / week decreased to 1x/wk 02/22/17    PT Duration  Other (comment) 5 weeks    PT Treatment/Interventions  ADLs/Self Care Home Management;DME Instruction;Gait training;Stair training;Functional mobility training;Therapeutic activities;Therapeutic exercise;Balance training;Orthotic Fit/Training;Patient/family  education;Neuromuscular re-education    PT Next Visit Plan  Continue to work on RLE and R ankle weakness, gait training, working on push-off and swing through phase of gait; work on terminal knee extension for stance phase; update HEP as needed.    Consulted and Agree with Plan of Care  Patient       Patient will benefit from skilled therapeutic intervention in order to improve the following deficits and impairments:  Abnormal gait, Decreased activity tolerance, Decreased balance, Decreased mobility, Difficulty walking, Decreased strength, Impaired flexibility, Impaired tone, Postural dysfunction, Decreased endurance  Visit Diagnosis: Muscle weakness (generalized)  Other abnormalities of gait and mobility     Problem List Patient Active Problem List   Diagnosis Date Noted  . Right leg weakness 01/12/2017  . Depression 02/11/2016  . Hypertension 12/31/2015  . Chronic fatigue 09/17/2015  . Shoulder pain, left 08/06/2015  . Encounter for antineoplastic immunotherapy 03/12/2015  . Malnutrition of moderate degree 01/17/2015  . S/P craniotomy 12/26/2014  . Brain metastasis ([place]) 12/26/2014  . Primary cancer of left lower lobe of lung ([place]) 12/22/2014  . Right sided weakness 12/12/2014  . Tobacco abuse     MARRIOTT,AMY W. 03/01/2017, 5:32 PM  Frazier Butt., PT   [facility] [address] [place], [place], [address] Phone: [phone]   Fax:  [phone]  Name: MALVERN KADLEC MRN: [id number] Date of Birth: [birth year]

## 2017-03-03 ENCOUNTER — Ambulatory Visit: Payer: BLUE CROSS/BLUE SHIELD | Admitting: Physical Therapy

## 2017-03-08 ENCOUNTER — Encounter: Payer: Self-pay | Admitting: Physical Therapy

## 2017-03-08 ENCOUNTER — Telehealth: Payer: Self-pay | Admitting: Physical Therapy

## 2017-03-08 ENCOUNTER — Ambulatory Visit: Payer: BLUE CROSS/BLUE SHIELD | Admitting: Physical Therapy

## 2017-03-08 DIAGNOSIS — M6281 Muscle weakness (generalized): Secondary | ICD-10-CM | POA: Diagnosis not present

## 2017-03-08 NOTE — Therapy (Signed)
[facility] [address] [place], [place], [address] Phone: [phone]   Fax:  [phone]  Physical Therapy Treatment  Patient Details  Name: Timothy Obrien MRN: [id number] Date of Birth: 08-07-1960 Referring Provider: Rhae Hammock, RN   Encounter Date: 03/08/2017  PT End of Session - 03/08/17 1054    Visit Number  7    Number of Visits  10    Date for PT Re-Evaluation  04/03/17    Authorization Type  BCBS    PT Start Time  0933    PT Stop Time  1015    PT Time Calculation (min)  42 min    Activity Tolerance  Patient tolerated treatment well    Behavior During Therapy  [facility] for tasks assessed/performed       Past Medical History:  Diagnosis Date  . Bronchitis   . Chronic fatigue 09/17/2015  . Depression 02/11/2016  . Hypertension 12/31/2015  . Lung cancer ([place])    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  . Pneumonia   . Seizures ([place])    last 12/11/14  . Shortness of breath dyspnea    with exertion  . Tobacco abuse     Past Surgical History:  Procedure Laterality Date  . APPLICATION OF CRANIAL NAVIGATION N/A 12/26/2014   Procedure: APPLICATION OF CRANIAL NAVIGATION;  Surgeon: Kevan Ny Ditty, MD;  Location: [facility];  Service: Neurosurgery;  Laterality: N/A;  . CRANIOTOMY N/A 12/26/2014   Procedure: CRANIOTOMY TUMOR EXCISION with BrainLab;  Surgeon: Kevan Ny Ditty, MD;  Location: [facility];  Service: Neurosurgery;  Laterality: N/A;  CRANIOTOMY TUMOR EXCISION with Stealth  . HERNIA REPAIR    . VASECTOMY    . VIDEO ASSISTED THORACOSCOPY (VATS)/WEDGE RESECTION Left 01/14/2015   Procedure: VIDEO ASSISTED THORACOSCOPY (VATS)/WEDGE RESECTION, Superior segmentectomy left  lower lobe, wedge resection of left upper lobe, multiple lymph node disection, On Q insertion.;  Surgeon: Grace Isaac, MD;  Location: [facility];  Service: Thoracic;  Laterality: Left;  Marland Kitchen VIDEO BRONCHOSCOPY Bilateral 12/16/2014    Procedure: VIDEO BRONCHOSCOPY WITH FLUORO;  Surgeon: Collene Gobble, MD;  Location: [facility];  Service: Cardiopulmonary;  Laterality: Bilateral;  . VIDEO BRONCHOSCOPY N/A 01/14/2015   Procedure: VIDEO BRONCHOSCOPY;  Surgeon: Grace Isaac, MD;  Location: [facility] OR;  Service: Thoracic;  Laterality: N/A;    There were no vitals filed for this visit.  Subjective Assessment - 03/08/17 0935    Subjective  No changes since last visit.  Did go to the orthotist last week after PT session, and they said they did not have order and I would need to have face to face MD visit.  "I'm not sure I want to do all this if it doesn't fix it and I may just keep it in the closet anyway."   PT to follow-up with orthotist after session-as pack of info, including R AFO order was sent to [employer] via fax on 02/22/17    Pertinent History  December 2016:  initial c/o seizure, characterized as "right sided shaking"; this led to an MRI which demonstrated an enhancing mass consistent with metastatic adenocarcinoma. (treated with resection and chemo) MRI in July 2018 demonstrated possible progression or radiation necrosis in brain     Patient Stated Goals  Pt's goals for therapy to get RLE stronger and get rid of the weakness.    Currently in Pain?  No/denies  [facility] Adult PT Treatment/Exercise - 03/08/17 0001      Ambulation/Gait   Ambulation/Gait  Yes    Ambulation/Gait Assistance  6: Modified independent (Device/Increase time)      Knee/Hip Exercises: Stretches   Active Hamstring Stretch  Right;3 reps;30 seconds Seated, foot propped on floor    Gastroc Stretch  Right;3 reps;30 seconds foot propped on 2" block-cues for technique    Other Knee/Hip Stretches  Attempted runner's stretch, but pt had difficulty with achieving proper technique      Knee/Hip Exercises: Standing   Heel Raises  Both;2 sets;10 reps;3 seconds /toe raises    Functional Squat  1 set;10 reps to replace wall  squats at home, as pt does not do wall squat    Wall Squat  10 reps;2 sets    Other Standing Knee Exercises  Sidestepping along counter, 4 reps to R and L, then x 2 reps R and L standing on balance beam (cues for increased foot clearance on RLE when sidestepping to L); stagger stance forward/back rocking x 15 reps each leg    Other Standing Knee Exercises  On balance beam, attempted marching in place to work on RLE strengthening on compliant surface, but patient demonstrates episodes of Clonus (quick stretch, jerky oscillating movements in RLE) each time he strikes RLE on compliant surface-chose to then work on stretching to RLE.      Pt needs cues (verbal and tactile) throughout exercises to achieve and maintain proper technique.       PT Education - 03/08/17 1053    Education provided  Yes    Education Details  Additions to HEP-see instructions; again gave rationale for AFO to help with normalizing gait pattern, as pt does not seem fully interested (may just keep in closet when the summer time comes and I wear flip-flops)    Person(s) Educated  Patient    Methods  Explanation;Demonstration    Comprehension  Verbalized understanding          PT Long Term Goals - 02/22/17 1524      PT LONG TERM GOAL #1   Title  Pt will be independent with HEP for improved strength, balance, and gait.  TARGET 03/10/17 (updated to 03/31/17)    Time  5    Period  Weeks    Status  New    Target Date  03/31/17      PT LONG TERM GOAL #2   Title  Pt will improve TUG score to less than or equal to 13.5 seconds for decreased fall risk.    Time  5    Period  Weeks    Status  New      PT LONG TERM GOAL #3   Title  6MWT to be assessed, with goal to be written as appropriate.    Baseline  02/03/17  1134 ft    Period  Weeks    Status  Achieved      PT LONG TERM GOAL #4   Title  Pt will ambulate at least 1000 ft, over indoor/outdoor surfaces, with appropriate device/orthotic for improved safety and  efficiency with gait.    Time  5    Period  Weeks    Status  New      PT LONG TERM GOAL #5   Title  Patient will improve 6 MWT to >=1300 ft (closer to age-based norm) demonstrating improved quality/safety of gait pattern.     Time  5    Period  Weeks  Status  New            Plan - 03/08/17 1055    Clinical Impression Statement  Chose to focus PT session on RLE strengthening and stretching this visit.  Pt had gone to orthotist, but was told they could not proceed with AFO until they get order from MD and until pateint has face-to-face with his MD.  (PT had already faxed to [employer] the order for R AFO, and it is in EPIC).  Provided rationale for AFO again, that it would assist with R foot clearance so patient can focus on timing and coordination of gait pattern, given entire RLE is weak, contributing to decreased foot clearance and foot slap on RLE.  Pt does not seem fully interested in AFO at this point, stating that he may likely keep it in the closet, especially during summer months when he wears flip-flops.  Therefore, addressed strengthening in session today.  Pt continues to have difficulty with sequencing/coordinating full knee extension with RLE foot flat positions, so stretches added to HEP.    Rehab Potential  Good    Clinical Impairments Affecting Rehab Potential  Hx of cancer/mets    PT Frequency  1x / week decreased to 1x/wk 02/22/17    PT Duration  Other (comment) 5 weeks    PT Treatment/Interventions  ADLs/Self Care Home Management;DME Instruction;Gait training;Stair training;Functional mobility training;Therapeutic activities;Therapeutic exercise;Balance training;Orthotic Fit/Training;Patient/family education;Neuromuscular re-education    PT Next Visit Plan  Continue to work on RLE and R ankle weakness, gait training, working on push-off and swing through phase of gait; work on terminal knee extension for stance phase; update HEP as needed.    Consulted and Agree with Plan  of Care  Patient       Patient will benefit from skilled therapeutic intervention in order to improve the following deficits and impairments:  Abnormal gait, Decreased activity tolerance, Decreased balance, Decreased mobility, Difficulty walking, Decreased strength, Impaired flexibility, Impaired tone, Postural dysfunction, Decreased endurance  Visit Diagnosis: Muscle weakness (generalized)     Problem List Patient Active Problem List   Diagnosis Date Noted  . Right leg weakness 01/12/2017  . Depression 02/11/2016  . Hypertension 12/31/2015  . Chronic fatigue 09/17/2015  . Shoulder pain, left 08/06/2015  . Encounter for antineoplastic immunotherapy 03/12/2015  . Malnutrition of moderate degree 01/17/2015  . S/P craniotomy 12/26/2014  . Brain metastasis ([place]) 12/26/2014  . Primary cancer of left lower lobe of lung ([place]) 12/22/2014  . Right sided weakness 12/12/2014  . Tobacco abuse     Cimberly Stoffel W. 03/08/2017, 10:59 AM  Frazier Butt., PT   [facility] [address] [place], [place], [address] Phone: [phone]   Fax:  [phone]  Name: Timothy Obrien MRN: [id number] Date of Birth: 1961/01/02

## 2017-03-08 NOTE — Telephone Encounter (Signed)
Called [employer] to follow-up on patient's question about [employer] still needing order for AFO.  PT let them know that full packet of information had been faxed to them (on 02/22/17) including order from Dr. Mickeal Skinner for Right AFO, face sheet and MD notes.  [employer] states that the patient's insurance also requests a face-to-face visit to physician.   Re-faxed information, including order for right AFO.  Called patient to clarify that he will need face-to-face visit with Dr. Mickeal Skinner and then follow up with [employer] in order to proceed with AFO.  Mady Haagensen, PT

## 2017-03-08 NOTE — Patient Instructions (Addendum)
FUNCTIONAL MOBILITY: Squat    Stance: Stand at the counter, with feet shoulder-width on floor. Bend hips and knees, holding 3 seconds. Keep back straight. Do not allow knees to bend past toes. Squeeze glutes and quads to stand. _10__ reps per set, _2__ sets per day  Copyright  [employer]. All rights reserved.

## 2017-03-15 ENCOUNTER — Ambulatory Visit: Payer: BLUE CROSS/BLUE SHIELD | Admitting: Physical Therapy

## 2017-03-22 ENCOUNTER — Ambulatory Visit: Payer: BLUE CROSS/BLUE SHIELD | Admitting: Physical Therapy

## 2017-03-23 ENCOUNTER — Other Ambulatory Visit: Payer: BLUE CROSS/BLUE SHIELD

## 2017-03-28 ENCOUNTER — Ambulatory Visit: Payer: BLUE CROSS/BLUE SHIELD | Admitting: Internal Medicine

## 2017-03-28 ENCOUNTER — Ambulatory Visit: Payer: BLUE CROSS/BLUE SHIELD | Admitting: Physical Therapy

## 2017-03-31 ENCOUNTER — Other Ambulatory Visit: Payer: BLUE CROSS/BLUE SHIELD

## 2017-03-31 ENCOUNTER — Ambulatory Visit
Admission: RE | Admit: 2017-03-31 | Discharge: 2017-03-31 | Disposition: A | Discharge status: Home / Self Care | Payer: BLUE CROSS/BLUE SHIELD | Source: Ambulatory Visit | Attending: Radiation Oncology | Admitting: Radiation Oncology

## 2017-03-31 DIAGNOSIS — C7931 Secondary malignant neoplasm of brain: Secondary | ICD-10-CM

## 2017-03-31 DIAGNOSIS — C7949 Secondary malignant neoplasm of other parts of nervous system: Principal | ICD-10-CM

## 2017-03-31 MED ORDER — GADOBENATE DIMEGLUMINE 529 MG/ML IV SOLN
17.0000 mL | Freq: Once | INTRAVENOUS | Status: AC | PRN
Start: 2017-03-31 — End: 2017-03-31
  Administered 2017-03-31: 17 mL via INTRAVENOUS

## 2017-04-03 ENCOUNTER — Other Ambulatory Visit: Payer: BLUE CROSS/BLUE SHIELD

## 2017-04-05 ENCOUNTER — Ambulatory Visit: Payer: BLUE CROSS/BLUE SHIELD | Admitting: Physical Therapy

## 2017-04-05 ENCOUNTER — Ambulatory Visit: Payer: BLUE CROSS/BLUE SHIELD | Admitting: Internal Medicine

## 2017-04-06 ENCOUNTER — Telehealth: Payer: Self-pay | Admitting: *Deleted

## 2017-04-06 ENCOUNTER — Other Ambulatory Visit: Payer: Self-pay | Admitting: Radiation Therapy

## 2017-04-06 ENCOUNTER — Other Ambulatory Visit: Payer: Self-pay | Admitting: *Deleted

## 2017-04-06 DIAGNOSIS — C7931 Secondary malignant neoplasm of brain: Secondary | ICD-10-CM

## 2017-04-06 MED ORDER — DEXAMETHASONE 4 MG PO TABS: 4.0000 mg | ORAL_TABLET | Freq: Every day | ORAL | 0 refills | Status: DC

## 2017-04-06 NOTE — Telephone Encounter (Signed)
Patient's ex wife/emergency contact called because she was able to see MRI results via mychart and was questioning if it meant progression of the brain mets.    Patient also called to request Decadron refill to be called into [facility] in [place] as he had to travel there for his fathers passing and he had stopped his Decadron and symptoms of leg weakness and drop foot had worsened without Decadron.    Per [place] to advise patient and ex wife that results would be reviewed in detail at his next appointment.  Ok to refill Decadron to new location.

## 2017-04-07 ENCOUNTER — Other Ambulatory Visit: Payer: Self-pay | Admitting: Radiation Oncology

## 2017-04-07 DIAGNOSIS — C7931 Secondary malignant neoplasm of brain: Secondary | ICD-10-CM

## 2017-04-10 ENCOUNTER — Other Ambulatory Visit: Payer: Self-pay | Admitting: Radiation Therapy

## 2017-04-10 DIAGNOSIS — C7931 Secondary malignant neoplasm of brain: Secondary | ICD-10-CM

## 2017-04-10 DIAGNOSIS — C7949 Secondary malignant neoplasm of other parts of nervous system: Principal | ICD-10-CM

## 2017-04-14 ENCOUNTER — Inpatient Hospital Stay: Payer: BLUE CROSS/BLUE SHIELD | Attending: Internal Medicine | Admitting: Internal Medicine

## 2017-04-14 ENCOUNTER — Encounter: Payer: Self-pay | Admitting: Internal Medicine

## 2017-04-14 ENCOUNTER — Telehealth: Payer: Self-pay

## 2017-04-14 VITALS — BP 134/93 | HR 80 | Temp 97.7°F | Resp 18 | Ht 72.0 in | Wt 192.7 lb

## 2017-04-14 DIAGNOSIS — C3432 Malignant neoplasm of lower lobe, left bronchus or lung: Secondary | ICD-10-CM | POA: Diagnosis not present

## 2017-04-14 DIAGNOSIS — R531 Weakness: Secondary | ICD-10-CM

## 2017-04-14 DIAGNOSIS — Z720 Tobacco use: Secondary | ICD-10-CM | POA: Diagnosis not present

## 2017-04-14 DIAGNOSIS — R29898 Other symptoms and signs involving the musculoskeletal system: Secondary | ICD-10-CM

## 2017-04-14 DIAGNOSIS — C7931 Secondary malignant neoplasm of brain: Secondary | ICD-10-CM

## 2017-04-14 NOTE — Telephone Encounter (Signed)
Per 4/5 no los

## 2017-04-14 NOTE — Progress Notes (Signed)
  [facility] [address]  [place], [place] [address] [phone]   Interval Patient Evaluation  Date of Service: 04/14/17 Patient Name: Timothy Obrien Patient MRN: [id number] Patient DOB: 07/03/1960 Provider: Zachary K Vaslow, MD  Identifying Statement:  Timothy Obrien is a 56 y.o. male with Brain metastasis (HCC) [C79.31]   Primary Cancer: NSCLC Stage IV  Oncologic History: 12/25/14: MRI demonstrates left frontal mestastasis, SRS is performed followed by resection. 12/23/16: Patient describes right leg weakness, some progression noted on MRI  Interval History:  Timothy Obrien presents today for follow up after recent MRI brain.  He is "more or less the same" as last time we met in January.  He still describes some degree of clumsiness with his right foot, struggles with coordination.  At one point he tried stopping decadron altogether, which led to some frank weakness of the leg, rash and overall lethargy.  Since that time he has been taking 4mg daily as prior.  Recently has been dealing with stress of the passing of his father in [place].       Medications: Current Outpatient Medications on File Prior to Visit  Medication Sig Dispense Refill  . ALPRAZolam (XANAX) 0.25 MG tablet Take 1 tablet by mouth daily as needed.    . dexamethasone (DECADRON) 4 MG tablet Take 1 tablet (4 mg total) by mouth daily. 30 tablet 0  . mirtazapine (REMERON) 30 MG tablet Take 1 tablet (30 mg total) by mouth at bedtime. 90 tablet 0  . pentoxifylline (TRENTAL) 400 MG CR tablet Take 1 tablet (400 mg total) by mouth 2 (two) times daily after a meal. 60 tablet 5  . vitamin E 400 UNIT capsule Take 400 Units by mouth daily.     No current facility-administered medications on file prior to visit.     Allergies: No Known Allergies Past Medical History:  Past Medical History:  Diagnosis Date  . Bronchitis   . Chronic fatigue 09/17/2015  . Depression  02/11/2016  . Hypertension 12/31/2015  . Lung cancer (HCC)    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  . Pneumonia   . Seizures (HCC)    last 12/11/14  . Shortness of breath dyspnea    with exertion  . Tobacco abuse    Past Surgical History:  Past Surgical History:  Procedure Laterality Date  . APPLICATION OF CRANIAL NAVIGATION N/A 12/26/2014   Procedure: APPLICATION OF CRANIAL NAVIGATION;  Surgeon: Benjamin Jared Ditty, MD;  Location: [facility];  Service: Neurosurgery;  Laterality: N/A;  . CRANIOTOMY N/A 12/26/2014   Procedure: CRANIOTOMY TUMOR EXCISION with BrainLab;  Surgeon: Benjamin Jared Ditty, MD;  Location: [facility];  Service: Neurosurgery;  Laterality: N/A;  CRANIOTOMY TUMOR EXCISION with Stealth  . HERNIA REPAIR    . VASECTOMY    . VIDEO ASSISTED THORACOSCOPY (VATS)/WEDGE RESECTION Left 01/14/2015   Procedure: VIDEO ASSISTED THORACOSCOPY (VATS)/WEDGE RESECTION, Superior segmentectomy left  lower lobe, wedge resection of left upper lobe, multiple lymph node disection, On Q insertion.;  Surgeon: Edward B Gerhardt, MD;  Location: [facility] OR;  Service: Thoracic;  Laterality: Left;  . VIDEO BRONCHOSCOPY Bilateral 12/16/2014   Procedure: VIDEO BRONCHOSCOPY WITH FLUORO;  Surgeon: Robert S Byrum, MD;  Location: [facility] END[facility];  Service: Cardiopulmonary;  Laterality: Bilateral;  . VIDEO BRONCHOSCOPY N/A 01/14/2015   Procedure: VIDEO BRONCHOSCOPY;  Surgeon: Edward B Gerhardt, MD;  Location: [facility] OR;  Service: Thoracic;  Laterality: N/A;     Social History:  Social History   Socioeconomic History  . Marital status: Divorced    Spouse name: Not on file  . Number of children: Not on file  . Years of education: Not on file  . Highest education level: Not on file  Occupational History  . Not on file  Social Needs  . Financial resource strain: Not on file  . Food insecurity:    Worry: Not on file    Inability: Not on file  . Transportation needs:    Medical: Not on file    Non-medical: Not  on file  Tobacco Use  . Smoking status: Former Smoker    Packs/day: 1.00    Years: 36.00    Pack years: 36.00    Types: Cigarettes    Start date: 12/14/1979    Last attempt to quit: 10/07/2015    Years since quitting: 1.5  . Smokeless tobacco: Current User  . Tobacco comment: Vapes: Few cigars a day. Quit cigarettes after easter.  Substance and Sexual Activity  . Alcohol use: Yes    Alcohol/week: 21.0 oz    Types: 35 Cans of beer per week    Comment: daily 6 beers a day for the past fefw years.   . Drug use: Yes    Types: Marijuana    Comment: last time 01/04/15  . Sexual activity: Not Currently  Lifestyle  . Physical activity:    Days per week: Not on file    Minutes per session: Not on file  . Stress: Not on file  Relationships  . Social connections:    Talks on phone: Not on file    Gets together: Not on file    Attends religious service: Not on file    Active member of club or organization: Not on file    Attends meetings of clubs or organizations: Not on file    Relationship status: Not on file  . Intimate partner violence:    Fear of current or ex partner: Not on file    Emotionally abused: Not on file    Physically abused: Not on file    Forced sexual activity: Not on file  Other Topics Concern  . 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 He does have a cat but no other home pets. No mold exposure. Recent travel to [place] but with symptoms at the onset of travel.   Family History:  Family History  Problem Relation Age of Onset  . Breast cancer Mother   . Colon polyps Mother   . Arthritis Father     Review of Systems: Constitutional: Denies fevers, chills or abnormal weight loss Eyes: Denies blurriness of vision Ears, nose, mouth, throat, and face: Denies mucositis or sore throat Respiratory: Denies cough, dyspnea or wheezes Cardiovascular: Denies palpitation, chest discomfort  or lower extremity swelling Gastrointestinal:  Denies nausea, constipation, diarrhea GU: Denies dysuria or incontinence Skin: Denies abnormal skin rashes Neurological: Per HPI Musculoskeletal: Denies joint pain, back or neck discomfort. No decrease in ROM Behavioral/Psych: Denies anxiety, disturbance in thought content, and mood instability   Physical Exam: Vitals:   04/14/17 1034  BP: (!) 134/93  Pulse: 80  Resp: 18  Temp: 97.7 F (36.5 C)  SpO2: 99%   KPS: 80. General: Alert, cooperative, pleasant, in no acute distress Head: Normal EENT: No conjunctival injection or scleral icterus. Oral mucosa moist Lungs: Resp effort normal Cardiac: Regular rate and  rhythm Abdomen: Soft, non-distended abdomen Skin: No rashes cyanosis or petechiae. Extremities: No clubbing or edema  Neurologic Exam: Mental Status: Awake, alert, attentive to examiner. Oriented to self and environment. Language is fluent with intact comprehension.  Cranial Nerves: Visual acuity is grossly normal. Visual fields are full. Extra-ocular movements intact. No ptosis. Face is symmetric, tongue midline. Motor: Tone and bulk are normal. Power is full in both arms, he is 4+/5 in right leg. Reflexes are symmetric, no pathologic reflexes present. Intact finger to nose bilaterally Sensory: Intact to light touch and temperature Gait: Normal but tandem gait is impaired   Labs: I have reviewed the data as listed    Component Value Date/Time   NA 138 02/10/2017 0800   NA 137 01/12/2017 0846   K 4.4 02/10/2017 0800   K 4.4 01/12/2017 0846   CL 100 02/10/2017 0800   CO2 26 02/10/2017 0800   CO2 18 (L) 01/12/2017 0846   GLUCOSE 93 02/10/2017 0800   GLUCOSE 91 01/12/2017 0846   BUN 16 02/10/2017 0800   BUN 15.3 01/12/2017 0846   CREATININE 1.07 02/10/2017 0800   CREATININE 1.0 01/12/2017 0846   CALCIUM 9.3 02/10/2017 0800   CALCIUM 9.0 01/12/2017 0846   PROT 7.7 02/10/2017 0800   PROT 7.2 01/12/2017 0846    ALBUMIN 3.9 02/10/2017 0800   ALBUMIN 3.9 01/12/2017 0846   AST 21 02/10/2017 0800   AST 23 01/12/2017 0846   ALT 18 02/10/2017 0800   ALT 22 01/12/2017 0846   ALKPHOS 92 02/10/2017 0800   ALKPHOS 99 01/12/2017 0846   BILITOT 0.3 02/10/2017 0800   BILITOT 0.41 01/12/2017 0846   GFRNONAA >60 02/10/2017 0800   GFRAA >60 02/10/2017 0800   Lab Results  Component Value Date   WBC 11.4 (H) 02/10/2017   NEUTROABS 9.9 (H) 02/10/2017   HGB 16.2 02/10/2017   HCT 48.5 02/10/2017   MCV 96.8 02/10/2017   PLT 302 02/10/2017    Imaging:  Mr Brain W Wo Contrast  Result Date: 04/01/2017 CLINICAL DATA:  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 Lung cancer. Brain metastases. New onset of right leg weakness and foot drop beginning 6 months ago. EXAM: MRI HEAD WITHOUT AND WITH CONTRAST TECHNIQUE: Multiplanar, multiecho pulse sequences of the brain and surrounding structures were obtained without and with intravenous contrast. CONTRAST:  17mL MULTIHANCE GADOBENATE DIMEGLUMINE 529 MG/ML IV SOLN COMPARISON:  MRI of the brain 12/23/2016 and 09/15/2016. FINDINGS: Brain: There is continued increase in size of the enhancing lesion within the posterior left frontal lobe. The lesion now measures 20 x 14 x 16 mm. This compares with 13 x 10 x 10 mm in the same plane. Surrounding vasogenic edema has increased. No other foci of enhancement are present. Left frontal craniotomy is noted. Blood products are again seen at the surgical site. No acute infarct or hemorrhage is present. No significant extra-axial fluid collection is present. Ventricles are of normal size. The internal auditory canals are within normal limits bilaterally. The brainstem and cerebellum are normal. Vascular: Flow is present in the major intracranial arteries. Skull and upper cervical spine: The skull base is within normal limits. The craniocervical junction is normal. Midline sagittal structures are unremarkable. Sinuses/Orbits: A  polyp or mucous retention cyst is again noted within the inferior right maxillary sinus. The remaining paranasal sinuses and mastoid air cells are clear. Rightward nasal septal deviation is evident. IMPRESSION: 1. Continued interval growth of enhancing lesion within the posterior left frontal   lobe with progressive surrounding vasogenic edema. Findings are concerning for progressive neoplasm. Given progression over the last 12 months, this likely represents tumor progression rather than radiation necrosis. 2. No new metastatic lesions are evident. Electronically Signed   By: San Morelle M.D.   On: 04/01/2017 10:08    [facility] Clinician Interpretation: I have personally reviewed the radiological images as listed.  My interpretation, in the context of the patient's clinical presentation, is progressive disease   Assessment/Plan 1. Brain metastasis ([place])  2. Right leg weakness  Timothy Obrien is clinically stable but radiographically progressive today.  At bedside we charted the change in the left frontal lesion over the past 12 months.  There is still possibility of radiation necrosis or mixed tumor/necrosis, although some form of organic neoplasm and driving the underlying process is likely.    We extensively discussed options, which include gross resection, referral for LITT, and medical treatment with steroids and/or avastin.  Our preference was for some form of local therapy, whether surgery or LITT.  Regardless, presurgical planning with functional MRI, given proximity of lesion to primary motor cortex and relative preservation of motor function, is strongly recommended.    Will plan on obtaining and reviewing functional MRI; patient will then consult formally with neurosurgery in next 1-2 weeks to stratify surgical eligibilty.  Case will be reviewed again in multi-disciplinary tumor board once images are obtained.  Recommended continuing decadron at 75m daily for now.  We appreciate the  opportunity to participate in the care of Timothy Obrien  We will maintain open communication regarding future treatment pathways and formal follow up.  All questions were answered. The patient knows to call the clinic with any problems, questions or concerns. No barriers to learning were detected.  The total time spent in the encounter was 40 minutes and more than 50% was on counseling and review of test results   ZVentura Sellers MD Medical Director of Neuro-[facility]cology [facility]04/05/19 1:08 PM

## 2017-04-18 ENCOUNTER — Ambulatory Visit (HOSPITAL_COMMUNITY)
Admission: RE | Admit: 2017-04-18 | Discharge: 2017-04-18 | Disposition: A | Discharge status: Home / Self Care | Payer: BLUE CROSS/BLUE SHIELD | Source: Ambulatory Visit | Attending: Radiation Oncology | Admitting: Radiation Oncology

## 2017-04-18 DIAGNOSIS — C7949 Secondary malignant neoplasm of other parts of nervous system: Secondary | ICD-10-CM

## 2017-04-18 DIAGNOSIS — M899 Disorder of bone, unspecified: Secondary | ICD-10-CM | POA: Diagnosis not present

## 2017-04-18 DIAGNOSIS — C7931 Secondary malignant neoplasm of brain: Secondary | ICD-10-CM | POA: Insufficient documentation

## 2017-04-18 LAB — POCT I-STAT CREATININE: CREATININE: 1 mg/dL (ref 0.61–1.24)

## 2017-04-18 MED ORDER — GADOBENATE DIMEGLUMINE 529 MG/ML IV SOLN
20.0000 mL | Freq: Once | INTRAVENOUS | Status: AC | PRN
  Administered 2017-04-18: 20 mL via INTRAVENOUS

## 2017-05-01 ENCOUNTER — Telehealth: Payer: Self-pay | Admitting: Internal Medicine

## 2017-05-01 ENCOUNTER — Other Ambulatory Visit: Payer: Self-pay | Admitting: Neurosurgery

## 2017-05-01 NOTE — Telephone Encounter (Signed)
Patient called to reschedule due to surgery

## 2017-05-02 NOTE — Pre-Procedure Instructions (Signed)
Timothy Obrien  05/02/2017      [id number] - [place], [place] - [address] [place] [address] Phone: [phone] Fax: [phone]  [id number] - [place], [address] [place] [address] Phone: [phone] Fax: [phone]    Your procedure is scheduled on 05/12/2017.  Report to [facility] Admitting at [place].M.  Call this number if you have problems the morning of surgery:  [phone]   Remember:  Do not eat food or drink liquids after midnight.   Take these medicines the morning of surgery with A SIP OF WATERU Alprazolam (Xanax) - if needed Dexamethasone (Decadron)  7 days prior to surgery STOP taking any Aspirin (unless otherwise instructed by your surgeon), Aleve, Naproxen, Ibuprofen, Motrin, Advil, Goody's, BC's, all herbal medications, fish oil, and all vitamins     Do not wear jewelry  Do not wear lotions, powders, or colognes, or deodorant.  Men may shave face and neck.  Do not bring valuables to the hospital.  [facility] is not responsible for any belongings or valuables.  Hearing aids, eyeglasses,contacts, dentures or bridgework may not be worn into surgery.  Leave your suitcase in the car.  After surgery it may be brought to your room.  For patients admitted to the hospital, discharge time will be determined by your treatment team.  Patients discharged the day of surgery will not be allowed to drive home.   Name and phone number of your driver:    Special instructions:   [facility]- Preparing For Surgery  Before surgery, you can play an important role. Because skin is not sterile, your skin needs to be as free of germs as possible. You can reduce the number of germs on your skin by washing with CHG (chlorahexidine gluconate) Soap before surgery.  CHG is an antiseptic cleaner which kills germs and bonds with the skin  to continue killing germs even after washing.  Please do not use if you have an allergy to CHG or antibacterial soaps. If your skin becomes reddened/irritated stop using the CHG.  Do not shave (including legs and underarms) for at least 48 hours prior to first CHG shower. It is OK to shave your face.  Please follow these instructions carefully.   1. Shower the NIGHT BEFORE SURGERY and the MORNING OF SURGERY with CHG.   2. If you chose to wash your hair, wash your hair first as usual with your normal shampoo.  3. After you shampoo, rinse your hair and body thoroughly to remove the shampoo.  4. Use CHG as you would any other liquid soap. You can apply CHG directly to the skin and wash gently with a scrungie or a clean washcloth.   5. Apply the CHG Soap to your body ONLY FROM THE NECK DOWN.  Do not use on open wounds or open sores. Avoid contact with your eyes, ears, mouth and genitals (private parts). Wash Face and genitals (private parts)  with your normal soap.  6. Wash thoroughly, paying special attention to the area where your surgery will be performed.  7. Thoroughly rinse your body with warm water from the neck down.  8. DO NOT shower/wash with your normal soap after using and rinsing off the CHG Soap.  9. Pat yourself dry with a CLEAN TOWEL.  10. Wear CLEAN PAJAMAS to bed the night  before surgery, wear comfortable clothes the morning of surgery  11. Place CLEAN SHEETS on your bed the night of your first shower and DO NOT SLEEP WITH PETS.    Day of Surgery: Shower as stated above. Do not apply any deodorants/lotions. Please wear clean clothes to the hospital/surgery center.      Please read over the following fact sheets that you were given.

## 2017-05-03 ENCOUNTER — Other Ambulatory Visit: Payer: Self-pay

## 2017-05-03 ENCOUNTER — Encounter (HOSPITAL_COMMUNITY): Payer: Self-pay

## 2017-05-03 ENCOUNTER — Encounter (HOSPITAL_COMMUNITY)
Admission: RE | Admit: 2017-05-03 | Discharge: 2017-05-03 | Disposition: A | Discharge status: Home / Self Care | Payer: BLUE CROSS/BLUE SHIELD | Source: Ambulatory Visit | Attending: Neurosurgery | Admitting: Neurosurgery

## 2017-05-03 DIAGNOSIS — Z01812 Encounter for preprocedural laboratory examination: Secondary | ICD-10-CM | POA: Diagnosis present

## 2017-05-03 HISTORY — DX: Anxiety disorder, unspecified: F41.9

## 2017-05-03 LAB — TYPE AND SCREEN
ABO/RH(D): A POS
Antibody Screen: NEGATIVE

## 2017-05-03 LAB — COMPREHENSIVE METABOLIC PANEL
ALT: 23 U/L (ref 17–63)
AST: 24 U/L (ref 15–41)
Albumin: 3.8 g/dL (ref 3.5–5.0)
Alkaline Phosphatase: 70 U/L (ref 38–126)
Anion gap: 11 (ref 5–15)
BUN: 18 mg/dL (ref 6–20)
CHLORIDE: 104 mmol/L (ref 101–111)
CO2: 19 mmol/L — AB (ref 22–32)
Calcium: 8.7 mg/dL — ABNORMAL LOW (ref 8.9–10.3)
Creatinine, Ser: 1.19 mg/dL (ref 0.61–1.24)
GFR calc non Af Amer: >60 mL/min (ref >60)
Glucose, Bld: 90 mg/dL (ref 65–99)
POTASSIUM: 4 mmol/L (ref 3.5–5.1)
SODIUM: 134 mmol/L — AB (ref 135–145)
Total Bilirubin: 0.6 mg/dL (ref 0.3–1.2)
Total Protein: 6.7 g/dL (ref 6.5–8.1)

## 2017-05-03 LAB — CBC
HEMATOCRIT: 48.5 % (ref 39.0–52.0)
Hemoglobin: 16.7 g/dL (ref 13.0–17.0)
MCH: 33.1 pg (ref 26.0–34.0)
MCHC: 34.4 g/dL (ref 30.0–36.0)
MCV: 96.2 fL (ref 78.0–100.0)
Platelets: 251 10*3/uL (ref 150–400)
RBC: 5.04 MIL/uL (ref 4.22–5.81)
RDW: 14 % (ref 11.5–15.5)
WBC: 13.8 10*3/uL — AB (ref 4.0–10.5)

## 2017-05-03 NOTE — Progress Notes (Signed)
[facility]P is [facility]- (His Dr has retired)  Denies seeing a cardiologist Denis chest pain, cough, or fever Denies ever having a card cath, stress test, or echo Instructed to stop taking Trental 5-7 days prior to surgery voices understanding.

## 2017-05-04 ENCOUNTER — Ambulatory Visit (HOSPITAL_COMMUNITY)
Admission: RE | Admit: 2017-05-04 | Discharge: 2017-05-04 | Disposition: A | Discharge status: Home / Self Care | Payer: BLUE CROSS/BLUE SHIELD | Source: Ambulatory Visit | Attending: Internal Medicine | Admitting: Internal Medicine

## 2017-05-04 ENCOUNTER — Inpatient Hospital Stay: Payer: BLUE CROSS/BLUE SHIELD

## 2017-05-04 ENCOUNTER — Encounter (HOSPITAL_COMMUNITY): Payer: Self-pay

## 2017-05-04 DIAGNOSIS — C7931 Secondary malignant neoplasm of brain: Secondary | ICD-10-CM | POA: Diagnosis not present

## 2017-05-04 DIAGNOSIS — I700 Atherosclerosis of aorta: Secondary | ICD-10-CM | POA: Insufficient documentation

## 2017-05-04 DIAGNOSIS — C3490 Malignant neoplasm of unspecified part of unspecified bronchus or lung: Secondary | ICD-10-CM

## 2017-05-04 DIAGNOSIS — J439 Emphysema, unspecified: Secondary | ICD-10-CM | POA: Insufficient documentation

## 2017-05-04 LAB — CBC WITH DIFFERENTIAL (CANCER CENTER ONLY)
BASOS ABS: 0 10*3/uL (ref 0.0–0.1)
Basophils Relative: 0 %
Eosinophils Absolute: 0 10*3/uL (ref 0.0–0.5)
Eosinophils Relative: 0 %
HEMATOCRIT: 49.4 % (ref 38.4–49.9)
Hemoglobin: 16.6 g/dL (ref 13.0–17.1)
LYMPHS PCT: 8 %
Lymphs Abs: 1.2 10*3/uL (ref 0.9–3.3)
MCH: 33.1 pg (ref 27.2–33.4)
MCHC: 33.6 g/dL (ref 32.0–36.0)
MCV: 98.4 fL — AB (ref 79.3–98.0)
MONO ABS: 0.3 10*3/uL (ref 0.1–0.9)
Monocytes Relative: 2 %
NEUTROS ABS: 13.3 10*3/uL — AB (ref 1.5–6.5)
Neutrophils Relative %: 90 %
Platelet Count: 288 10*3/uL (ref 140–400)
RBC: 5.02 MIL/uL (ref 4.20–5.82)
RDW: 14 % (ref 11.0–14.6)
WBC Count: 14.9 10*3/uL — ABNORMAL HIGH (ref 4.0–10.3)

## 2017-05-04 LAB — CMP (CANCER CENTER ONLY)
ALBUMIN: 4.1 g/dL (ref 3.5–5.0)
ALT: 30 U/L (ref 0–55)
AST: 25 U/L (ref 5–34)
Alkaline Phosphatase: 83 U/L (ref 40–150)
Anion gap: 14 — ABNORMAL HIGH (ref 3–11)
BILIRUBIN TOTAL: 0.4 mg/dL (ref 0.2–1.2)
BUN: 19 mg/dL (ref 7–26)
CO2: 22 mmol/L (ref 22–29)
Calcium: 9.8 mg/dL (ref 8.4–10.4)
Chloride: 103 mmol/L (ref 98–109)
Creatinine: 1.19 mg/dL (ref 0.70–1.30)
GFR, Est AFR Am: >60 mL/min (ref >60)
GFR, Estimated: >60 mL/min (ref >60)
GLUCOSE: 108 mg/dL (ref 70–140)
Potassium: 4.3 mmol/L (ref 3.5–5.1)
Sodium: 139 mmol/L (ref 136–145)
TOTAL PROTEIN: 7.7 g/dL (ref 6.4–8.3)

## 2017-05-04 LAB — TSH: TSH: 0.544 u[IU]/mL (ref 0.320–4.118)

## 2017-05-04 MED ORDER — IOHEXOL 300 MG/ML  SOLN
100.0000 mL | Freq: Once | INTRAMUSCULAR | Status: AC | PRN
  Administered 2017-05-04: 100 mL via INTRAVENOUS

## 2017-05-08 ENCOUNTER — Other Ambulatory Visit: Payer: Self-pay | Admitting: *Deleted

## 2017-05-08 MED ORDER — DEXAMETHASONE 4 MG PO TABS: 4.0000 mg | ORAL_TABLET | Freq: Every day | ORAL | 0 refills | Status: DC

## 2017-05-11 ENCOUNTER — Other Ambulatory Visit: Payer: BLUE CROSS/BLUE SHIELD

## 2017-05-11 ENCOUNTER — Ambulatory Visit (HOSPITAL_COMMUNITY): Payer: BLUE CROSS/BLUE SHIELD

## 2017-05-11 ENCOUNTER — Other Ambulatory Visit: Payer: Self-pay | Admitting: Neurosurgery

## 2017-05-11 ENCOUNTER — Telehealth: Payer: Self-pay | Admitting: *Deleted

## 2017-05-11 NOTE — Telephone Encounter (Signed)
Received call from [facility] stating that Timothy Obrien is having a craniotomy tomorrow for new brain mets & trouble walking.  She states that he had blood work prior to CT & pre-op & next visit with Dr Julien Nordmann is 5/20 but she is concerned about him being winded with minimal exertion & wonders if he needs to be seen sooner.  She also asked if Dr Julien Nordmann makes rounds in the hospital & would this be an option to be seen in the hospital which would be easier for pt.  She reports that the pt is on decadron.  Her call back # is [phone].  Message routed to Dr Mohamed/Pod RN

## 2017-05-12 ENCOUNTER — Ambulatory Visit (HOSPITAL_COMMUNITY): Payer: BLUE CROSS/BLUE SHIELD

## 2017-05-12 ENCOUNTER — Inpatient Hospital Stay (HOSPITAL_COMMUNITY)
Admission: RE | Admit: 2017-05-12 | Discharge: 2017-05-13 | DRG: 025 | Disposition: A | Discharge status: Home / Self Care | Payer: BLUE CROSS/BLUE SHIELD | Source: Ambulatory Visit | Attending: Neurosurgery | Admitting: Neurosurgery

## 2017-05-12 ENCOUNTER — Inpatient Hospital Stay (HOSPITAL_COMMUNITY): Payer: BLUE CROSS/BLUE SHIELD

## 2017-05-12 ENCOUNTER — Other Ambulatory Visit: Payer: BLUE CROSS/BLUE SHIELD

## 2017-05-12 ENCOUNTER — Inpatient Hospital Stay (HOSPITAL_COMMUNITY)
Admission: RE | Disposition: A | Discharge status: Home / Self Care | Payer: Self-pay | Source: Ambulatory Visit | Attending: Neurosurgery

## 2017-05-12 ENCOUNTER — Telehealth: Payer: Self-pay | Admitting: Medical Oncology

## 2017-05-12 ENCOUNTER — Encounter (HOSPITAL_COMMUNITY): Payer: Self-pay | Admitting: *Deleted

## 2017-05-12 ENCOUNTER — Inpatient Hospital Stay (HOSPITAL_COMMUNITY): Payer: BLUE CROSS/BLUE SHIELD | Admitting: Vascular Surgery

## 2017-05-12 DIAGNOSIS — Z85118 Personal history of other malignant neoplasm of bronchus and lung: Secondary | ICD-10-CM | POA: Diagnosis not present

## 2017-05-12 DIAGNOSIS — Z87891 Personal history of nicotine dependence: Secondary | ICD-10-CM

## 2017-05-12 DIAGNOSIS — Z7952 Long term (current) use of systemic steroids: Secondary | ICD-10-CM

## 2017-05-12 DIAGNOSIS — I10 Essential (primary) hypertension: Secondary | ICD-10-CM | POA: Diagnosis present

## 2017-05-12 DIAGNOSIS — R531 Weakness: Secondary | ICD-10-CM | POA: Diagnosis present

## 2017-05-12 DIAGNOSIS — F419 Anxiety disorder, unspecified: Secondary | ICD-10-CM | POA: Diagnosis present

## 2017-05-12 DIAGNOSIS — G936 Cerebral edema: Secondary | ICD-10-CM | POA: Diagnosis present

## 2017-05-12 DIAGNOSIS — I6789 Other cerebrovascular disease: Secondary | ICD-10-CM | POA: Diagnosis present

## 2017-05-12 DIAGNOSIS — Z803 Family history of malignant neoplasm of breast: Secondary | ICD-10-CM

## 2017-05-12 DIAGNOSIS — T66XXXA Radiation sickness, unspecified, initial encounter: Secondary | ICD-10-CM | POA: Diagnosis present

## 2017-05-12 DIAGNOSIS — D496 Neoplasm of unspecified behavior of brain: Secondary | ICD-10-CM | POA: Diagnosis present

## 2017-05-12 HISTORY — PX: CRANIOTOMY: SHX93

## 2017-05-12 HISTORY — PX: APPLICATION OF CRANIAL NAVIGATION: SHX6578

## 2017-05-12 LAB — POCT I-STAT 7, (LYTES, BLD GAS, ICA,H+H)
Acid-base deficit: 5 mmol/L — ABNORMAL HIGH (ref 0.0–2.0)
BICARBONATE: 21.2 mmol/L (ref 20.0–28.0)
Calcium, Ion: 1.12 mmol/L — ABNORMAL LOW (ref 1.15–1.40)
HCT: 39 % (ref 39.0–52.0)
HEMOGLOBIN: 13.3 g/dL (ref 13.0–17.0)
O2 Saturation: 100 %
PCO2 ART: 39.2 mmHg (ref 32.0–48.0)
Potassium: 4.1 mmol/L (ref 3.5–5.1)
Sodium: 137 mmol/L (ref 135–145)
TCO2: 23 mmol/L (ref 22–32)
pH, Arterial: 7.332 — ABNORMAL LOW (ref 7.350–7.450)
pO2, Arterial: 177 mmHg — ABNORMAL HIGH (ref 83.0–108.0)

## 2017-05-12 SURGERY — CRANIOTOMY TUMOR EXCISION
Anesthesia: General | Site: Head | Laterality: Left

## 2017-05-12 MED ORDER — CEFAZOLIN SODIUM-DEXTROSE 2-4 GM/100ML-% IV SOLN
2.0000 g | Freq: Three times a day (TID) | INTRAVENOUS | Status: AC
  Administered 2017-05-12 – 2017-05-13 (×2): 2 g via INTRAVENOUS
  Filled 2017-05-12 (×2): qty 100

## 2017-05-12 MED ORDER — CEFAZOLIN SODIUM-DEXTROSE 2-4 GM/100ML-% IV SOLN
INTRAVENOUS | Status: AC
  Filled 2017-05-12: qty 100

## 2017-05-12 MED ORDER — SODIUM CHLORIDE 0.9 % IV SOLN
INTRAVENOUS | Status: DC
  Administered 2017-05-12 (×2): via INTRAVENOUS

## 2017-05-12 MED ORDER — MANNITOL 25 % IV SOLN
INTRAVENOUS | Status: DC | PRN
  Administered 2017-05-12: 50 g via INTRAVENOUS

## 2017-05-12 MED ORDER — LIDOCAINE-EPINEPHRINE 1 %-1:100000 IJ SOLN
INTRAMUSCULAR | Status: DC | PRN
  Administered 2017-05-12: 5 mL

## 2017-05-12 MED ORDER — GELATIN ABSORBABLE MT POWD
OROMUCOSAL | Status: DC | PRN
  Administered 2017-05-12: 15:00:00 via TOPICAL

## 2017-05-12 MED ORDER — FLEET ENEMA 7-19 GM/118ML RE ENEM: 1.0000 | ENEMA | Freq: Once | RECTAL | Status: DC | PRN

## 2017-05-12 MED ORDER — ACETAMINOPHEN 500 MG PO TABS
ORAL_TABLET | ORAL | Status: AC
  Filled 2017-05-12: qty 2

## 2017-05-12 MED ORDER — ACETAMINOPHEN 325 MG PO TABS: 650.0000 mg | ORAL_TABLET | ORAL | Status: DC | PRN

## 2017-05-12 MED ORDER — HYDROMORPHONE HCL 2 MG/ML IJ SOLN
0.2500 mg | INTRAMUSCULAR | Status: DC | PRN
  Administered 2017-05-12 (×4): 0.5 mg via INTRAVENOUS

## 2017-05-12 MED ORDER — PROPOFOL 10 MG/ML IV BOLUS
INTRAVENOUS | Status: DC | PRN
  Administered 2017-05-12: 70 mg via INTRAVENOUS
  Administered 2017-05-12: 50 mg via INTRAVENOUS
  Administered 2017-05-12: 130 mg via INTRAVENOUS

## 2017-05-12 MED ORDER — DEXAMETHASONE SODIUM PHOSPHATE 4 MG/ML IJ SOLN
4.0000 mg | Freq: Four times a day (QID) | INTRAMUSCULAR | Status: DC
  Filled 2017-05-12: qty 1

## 2017-05-12 MED ORDER — BUPIVACAINE HCL (PF) 0.5 % IJ SOLN
INTRAMUSCULAR | Status: DC | PRN
  Administered 2017-05-12: 5 mL

## 2017-05-12 MED ORDER — LABETALOL HCL 5 MG/ML IV SOLN
INTRAVENOUS | Status: AC
  Filled 2017-05-12: qty 4

## 2017-05-12 MED ORDER — ONDANSETRON HCL 4 MG/2ML IJ SOLN
INTRAMUSCULAR | Status: AC
  Filled 2017-05-12: qty 2

## 2017-05-12 MED ORDER — ONDANSETRON HCL 4 MG/2ML IJ SOLN: 4.0000 mg | Freq: Once | INTRAMUSCULAR | Status: DC | PRN

## 2017-05-12 MED ORDER — CEFAZOLIN SODIUM-DEXTROSE 2-4 GM/100ML-% IV SOLN
2.0000 g | INTRAVENOUS | Status: AC
  Administered 2017-05-12: 2 g via INTRAVENOUS

## 2017-05-12 MED ORDER — ESMOLOL HCL 100 MG/10ML IV SOLN
INTRAVENOUS | Status: DC | PRN
Start: 2017-05-12 — End: 2017-05-12
  Administered 2017-05-12: 20 mg via INTRAVENOUS

## 2017-05-12 MED ORDER — DEXAMETHASONE SODIUM PHOSPHATE 10 MG/ML IJ SOLN
INTRAMUSCULAR | Status: DC | PRN
  Administered 2017-05-12: 10 mg via INTRAVENOUS

## 2017-05-12 MED ORDER — LIDOCAINE HCL (CARDIAC) PF 100 MG/5ML IV SOSY
PREFILLED_SYRINGE | INTRAVENOUS | Status: DC | PRN
  Administered 2017-05-12: 100 mg via INTRAVENOUS

## 2017-05-12 MED ORDER — 0.9 % SODIUM CHLORIDE (POUR BTL) OPTIME
TOPICAL | Status: DC | PRN
  Administered 2017-05-12: 2000 mL

## 2017-05-12 MED ORDER — ACETAMINOPHEN 650 MG RE SUPP: 650.0000 mg | RECTAL | Status: DC | PRN

## 2017-05-12 MED ORDER — ONDANSETRON HCL 4 MG/2ML IJ SOLN: 4.0000 mg | INTRAMUSCULAR | Status: DC | PRN

## 2017-05-12 MED ORDER — PHENYLEPHRINE HCL 10 MG/ML IJ SOLN
INTRAMUSCULAR | Status: DC | PRN
  Administered 2017-05-12: 20 ug/min via INTRAVENOUS

## 2017-05-12 MED ORDER — BACITRACIN ZINC 500 UNIT/GM EX OINT
TOPICAL_OINTMENT | CUTANEOUS | Status: DC | PRN
  Administered 2017-05-12: 1 via TOPICAL

## 2017-05-12 MED ORDER — NITROGLYCERIN 0.2 MG/ML ON CALL CATH LAB
INTRAVENOUS | Status: DC | PRN
Start: 2017-05-12 — End: 2017-05-12
  Administered 2017-05-12 (×2): 80 ug via INTRAVENOUS
  Administered 2017-05-12 (×2): 40 ug via INTRAVENOUS

## 2017-05-12 MED ORDER — HYDROMORPHONE HCL 2 MG/ML IJ SOLN
INTRAMUSCULAR | Status: AC
  Filled 2017-05-12: qty 1

## 2017-05-12 MED ORDER — ACETAMINOPHEN 500 MG PO TABS
1000.0000 mg | ORAL_TABLET | Freq: Once | ORAL | Status: AC
  Administered 2017-05-12: 1000 mg via ORAL

## 2017-05-12 MED ORDER — FENTANYL CITRATE (PF) 250 MCG/5ML IJ SOLN
INTRAMUSCULAR | Status: AC
  Filled 2017-05-12: qty 5

## 2017-05-12 MED ORDER — ARTIFICIAL TEARS OPHTHALMIC OINT
TOPICAL_OINTMENT | OPHTHALMIC | Status: DC | PRN
  Administered 2017-05-12: 1 via OPHTHALMIC

## 2017-05-12 MED ORDER — DEXAMETHASONE SODIUM PHOSPHATE 4 MG/ML IJ SOLN
4.0000 mg | Freq: Three times a day (TID) | INTRAMUSCULAR | Status: DC
  Filled 2017-05-12: qty 1

## 2017-05-12 MED ORDER — HYDROMORPHONE HCL 2 MG/ML IJ SOLN
0.5000 mg | INTRAMUSCULAR | Status: DC | PRN
  Administered 2017-05-12: 0.5 mg via INTRAVENOUS

## 2017-05-12 MED ORDER — NALOXONE HCL 0.4 MG/ML IJ SOLN: 0.0800 mg | INTRAMUSCULAR | Status: DC | PRN

## 2017-05-12 MED ORDER — HYDROMORPHONE HCL 1 MG/ML IJ SOLN
0.5000 mg | INTRAMUSCULAR | Status: DC | PRN
  Administered 2017-05-13: 0.5 mg via INTRAVENOUS
  Filled 2017-05-12: qty 1

## 2017-05-12 MED ORDER — LABETALOL HCL 5 MG/ML IV SOLN
5.0000 mg | Freq: Once | INTRAVENOUS | Status: AC
  Administered 2017-05-12: 5 mg via INTRAVENOUS

## 2017-05-12 MED ORDER — MEPERIDINE HCL 50 MG/ML IJ SOLN: 6.2500 mg | INTRAMUSCULAR | Status: DC | PRN

## 2017-05-12 MED ORDER — BUPIVACAINE HCL (PF) 0.5 % IJ SOLN
INTRAMUSCULAR | Status: AC
  Filled 2017-05-12: qty 30

## 2017-05-12 MED ORDER — LEVETIRACETAM 500 MG/5ML IV SOLN
INTRAVENOUS | Status: DC | PRN
  Administered 2017-05-12: 1000 mg via INTRAVENOUS

## 2017-05-12 MED ORDER — DEXAMETHASONE SODIUM PHOSPHATE 10 MG/ML IJ SOLN
6.0000 mg | Freq: Four times a day (QID) | INTRAMUSCULAR | Status: DC
  Administered 2017-05-12 – 2017-05-13 (×3): 6 mg via INTRAVENOUS
  Filled 2017-05-12 (×2): qty 1
  Filled 2017-05-12: qty 0.6
  Filled 2017-05-12: qty 1

## 2017-05-12 MED ORDER — THROMBIN 20000 UNITS EX SOLR
CUTANEOUS | Status: AC
  Filled 2017-05-12: qty 20000

## 2017-05-12 MED ORDER — SENNOSIDES-DOCUSATE SODIUM 8.6-50 MG PO TABS: 1.0000 | ORAL_TABLET | Freq: Every evening | ORAL | Status: DC | PRN

## 2017-05-12 MED ORDER — SODIUM CHLORIDE 0.9 % IV SOLN
INTRAVENOUS | Status: DC | PRN
  Administered 2017-05-12: 15:00:00

## 2017-05-12 MED ORDER — LACTATED RINGERS IV SOLN: INTRAVENOUS | Status: DC

## 2017-05-12 MED ORDER — EPHEDRINE SULFATE 50 MG/ML IJ SOLN
INTRAMUSCULAR | Status: AC
  Filled 2017-05-12: qty 1

## 2017-05-12 MED ORDER — BACITRACIN ZINC 500 UNIT/GM EX OINT
TOPICAL_OINTMENT | CUTANEOUS | Status: AC
  Filled 2017-05-12: qty 28.35

## 2017-05-12 MED ORDER — ONDANSETRON HCL 4 MG PO TABS: 4.0000 mg | ORAL_TABLET | ORAL | Status: DC | PRN

## 2017-05-12 MED ORDER — HYDROCODONE-ACETAMINOPHEN 5-325 MG PO TABS
1.0000 | ORAL_TABLET | ORAL | Status: DC | PRN
  Administered 2017-05-13: 1 via ORAL
  Filled 2017-05-12: qty 1

## 2017-05-12 MED ORDER — SUGAMMADEX SODIUM 200 MG/2ML IV SOLN
INTRAVENOUS | Status: DC | PRN
  Administered 2017-05-12: 200 mg via INTRAVENOUS

## 2017-05-12 MED ORDER — PHENYLEPHRINE 40 MCG/ML (10ML) SYRINGE FOR IV PUSH (FOR BLOOD PRESSURE SUPPORT)
PREFILLED_SYRINGE | INTRAVENOUS | Status: AC
  Filled 2017-05-12: qty 10

## 2017-05-12 MED ORDER — PROPOFOL 10 MG/ML IV BOLUS
INTRAVENOUS | Status: AC
  Filled 2017-05-12: qty 20

## 2017-05-12 MED ORDER — BISACODYL 5 MG PO TBEC: 5.0000 mg | DELAYED_RELEASE_TABLET | Freq: Every day | ORAL | Status: DC | PRN

## 2017-05-12 MED ORDER — SODIUM CHLORIDE 0.9 % IV SOLN
INTRAVENOUS | Status: DC | PRN
  Administered 2017-05-12: 14:00:00 via INTRAVENOUS

## 2017-05-12 MED ORDER — CHLORHEXIDINE GLUCONATE CLOTH 2 % EX PADS: 6.0000 | MEDICATED_PAD | Freq: Once | CUTANEOUS | Status: DC

## 2017-05-12 MED ORDER — THROMBIN (RECOMBINANT) 20000 UNITS EX SOLR
CUTANEOUS | Status: AC
  Filled 2017-05-12: qty 20000

## 2017-05-12 MED ORDER — ONDANSETRON HCL 4 MG/2ML IJ SOLN
INTRAMUSCULAR | Status: DC | PRN
  Administered 2017-05-12: 4 mg via INTRAVENOUS

## 2017-05-12 MED ORDER — FENTANYL CITRATE (PF) 100 MCG/2ML IJ SOLN
INTRAMUSCULAR | Status: DC | PRN
  Administered 2017-05-12 (×3): 50 ug via INTRAVENOUS
  Administered 2017-05-12: 150 ug via INTRAVENOUS
  Administered 2017-05-12 (×4): 50 ug via INTRAVENOUS

## 2017-05-12 MED ORDER — THROMBIN 5000 UNITS EX SOLR
CUTANEOUS | Status: AC
Start: 2017-05-12 — End: 2017-05-12
  Filled 2017-05-12: qty 5000

## 2017-05-12 MED ORDER — ROCURONIUM BROMIDE 100 MG/10ML IV SOLN
INTRAVENOUS | Status: DC | PRN
  Administered 2017-05-12 (×2): 50 mg via INTRAVENOUS
  Administered 2017-05-12: 100 mg via INTRAVENOUS

## 2017-05-12 MED ORDER — LABETALOL HCL 5 MG/ML IV SOLN: 10.0000 mg | INTRAVENOUS | Status: DC | PRN

## 2017-05-12 MED ORDER — DOCUSATE SODIUM 100 MG PO CAPS
100.0000 mg | ORAL_CAPSULE | Freq: Two times a day (BID) | ORAL | Status: DC
  Administered 2017-05-13: 100 mg via ORAL
  Filled 2017-05-12: qty 1

## 2017-05-12 MED ORDER — LABETALOL HCL 5 MG/ML IV SOLN
INTRAVENOUS | Status: DC | PRN
  Administered 2017-05-12 (×2): 10 mg via INTRAVENOUS

## 2017-05-12 MED ORDER — THROMBIN (RECOMBINANT) 20000 UNITS EX SOLR
CUTANEOUS | Status: DC | PRN
  Administered 2017-05-12: 15:00:00 via TOPICAL

## 2017-05-12 MED ORDER — LIDOCAINE-EPINEPHRINE 1 %-1:100000 IJ SOLN
INTRAMUSCULAR | Status: AC
  Filled 2017-05-12: qty 1

## 2017-05-12 MED ORDER — LEVETIRACETAM IN NACL 500 MG/100ML IV SOLN
500.0000 mg | Freq: Two times a day (BID) | INTRAVENOUS | Status: DC
  Administered 2017-05-12 – 2017-05-13 (×2): 500 mg via INTRAVENOUS
  Filled 2017-05-12 (×3): qty 100

## 2017-05-12 MED ORDER — PROMETHAZINE HCL 25 MG PO TABS: 12.5000 mg | ORAL_TABLET | ORAL | Status: DC | PRN

## 2017-05-12 MED ORDER — OXYCODONE HCL 5 MG PO TABS: 5.0000 mg | ORAL_TABLET | ORAL | Status: DC | PRN

## 2017-05-12 SURGICAL SUPPLY — 111 items
APL SKNCLS STERI-STRIP NONHPOA (GAUZE/BANDAGES/DRESSINGS) ×1
BATTERY IQ STERILE (MISCELLANEOUS) ×2 IMPLANT
BENZOIN TINCTURE PRP APPL 2/3 (GAUZE/BANDAGES/DRESSINGS) ×2 IMPLANT
BLADE CLIPPER SURG (BLADE) ×3 IMPLANT
BLADE SAW GIGLI 16 STRL (MISCELLANEOUS) IMPLANT
BLADE SURG 15 STRL LF DISP TIS (BLADE) IMPLANT
BLADE SURG 15 STRL SS (BLADE)
BLADE ULTRA TIP 2M (BLADE) ×1 IMPLANT
BNDG CMPR 75X41 PLY HI ABS (GAUZE/BANDAGES/DRESSINGS)
BNDG GAUZE ELAST 4 BULKY (GAUZE/BANDAGES/DRESSINGS) IMPLANT
BNDG STRETCH 4X75 STRL LF (GAUZE/BANDAGES/DRESSINGS) IMPLANT
BTRY SRG DRVR 1.5 IQ (MISCELLANEOUS) ×1
BUR ACORN 6.0 PRECISION (BURR) ×2 IMPLANT
BUR ACORN 6.0MM PRECISION (BURR) ×1
BUR ROUND FLUTED 4 SOFT TCH (BURR) IMPLANT
BUR ROUND FLUTED 4MM SOFT TCH (BURR)
BUR SPIRAL ROUTER 2.3 (BUR) ×2 IMPLANT
BUR SPIRAL ROUTER 2.3MM (BUR) ×1
CANISTER SUCT 3000ML PPV (MISCELLANEOUS) ×6 IMPLANT
CARTRIDGE OIL MAESTRO DRILL (MISCELLANEOUS) ×2 IMPLANT
CATH VENTRIC 35X38 W/TROCAR LG (CATHETERS) IMPLANT
CLIP VESOCCLUDE MED 6/CT (CLIP) IMPLANT
CONT SPEC 4OZ CLIKSEAL STRL BL (MISCELLANEOUS) ×3 IMPLANT
COVER MAYO STAND STRL (DRAPES) IMPLANT
DECANTER SPIKE VIAL GLASS SM (MISCELLANEOUS) ×5 IMPLANT
DIFFUSER DRILL AIR PNEUMATIC (MISCELLANEOUS) ×4 IMPLANT
DRAIN SUBARACHNOID (WOUND CARE) IMPLANT
DRAPE HALF SHEET 40X57 (DRAPES) ×3 IMPLANT
DRAPE MICROSCOPE LEICA (MISCELLANEOUS) ×2 IMPLANT
DRAPE NEUROLOGICAL W/INCISE (DRAPES) ×3 IMPLANT
DRAPE STERI IOBAN 125X83 (DRAPES) IMPLANT
DRAPE SURG 17X23 STRL (DRAPES) IMPLANT
DRAPE WARM FLUID 44X44 (DRAPE) ×3 IMPLANT
DRSG ADAPTIC 3X8 NADH LF (GAUZE/BANDAGES/DRESSINGS) IMPLANT
DRSG TELFA 3X8 NADH (GAUZE/BANDAGES/DRESSINGS) ×3 IMPLANT
DURAMATRIX ONLAY 3X3 (Plate) ×2 IMPLANT
DURAPREP 6ML APPLICATOR 50/CS (WOUND CARE) ×3 IMPLANT
ELECT REM PT RETURN 9FT ADLT (ELECTROSURGICAL) ×3
ELECTRODE REM PT RTRN 9FT ADLT (ELECTROSURGICAL) ×1 IMPLANT
EVACUATOR 1/8 PVC DRAIN (DRAIN) IMPLANT
EVACUATOR SILICONE 100CC (DRAIN) IMPLANT
FORCEPS BIPOLAR SPETZLER 8 1.0 (NEUROSURGERY SUPPLIES) ×3 IMPLANT
GAUZE SPONGE 4X4 12PLY STRL (GAUZE/BANDAGES/DRESSINGS) ×3 IMPLANT
GAUZE SPONGE 4X4 16PLY XRAY LF (GAUZE/BANDAGES/DRESSINGS) IMPLANT
GLOVE BIO SURGEON STRL SZ7 (GLOVE) IMPLANT
GLOVE BIOGEL PI IND STRL 7.0 (GLOVE) IMPLANT
GLOVE BIOGEL PI IND STRL 7.5 (GLOVE) ×1 IMPLANT
GLOVE BIOGEL PI INDICATOR 7.0 (GLOVE)
GLOVE BIOGEL PI INDICATOR 7.5 (GLOVE) ×2
GLOVE ECLIPSE 7.0 STRL STRAW (GLOVE) ×6 IMPLANT
GLOVE EXAM NITRILE LRG STRL (GLOVE) IMPLANT
GLOVE EXAM NITRILE XL STR (GLOVE) IMPLANT
GLOVE EXAM NITRILE XS STR PU (GLOVE) IMPLANT
GOWN STRL REUS W/ TWL LRG LVL3 (GOWN DISPOSABLE) ×2 IMPLANT
GOWN STRL REUS W/ TWL XL LVL3 (GOWN DISPOSABLE) IMPLANT
GOWN STRL REUS W/TWL 2XL LVL3 (GOWN DISPOSABLE) IMPLANT
GOWN STRL REUS W/TWL LRG LVL3 (GOWN DISPOSABLE) ×6
GOWN STRL REUS W/TWL XL LVL3 (GOWN DISPOSABLE)
HEMOSTAT POWDER KIT SURGIFOAM (HEMOSTASIS) ×3 IMPLANT
HEMOSTAT SURGICEL 2X14 (HEMOSTASIS) ×3 IMPLANT
HOOK DURA 1/2IN (MISCELLANEOUS) ×3 IMPLANT
IV NS 1000ML (IV SOLUTION)
IV NS 1000ML BAXH (IV SOLUTION) ×1 IMPLANT
KIT BASIN OR (CUSTOM PROCEDURE TRAY) ×3 IMPLANT
KIT DRAIN CSF ACCUDRAIN (MISCELLANEOUS) IMPLANT
KIT TURNOVER KIT B (KITS) ×3 IMPLANT
KNIFE ARACHNOID DISP AM-24-S (MISCELLANEOUS) ×3 IMPLANT
MARKER SPHERE PSV REFLC 13MM (MARKER) ×6 IMPLANT
NDL SPNL 18GX3.5 QUINCKE PK (NEEDLE) IMPLANT
NEEDLE HYPO 22GX1.5 SAFETY (NEEDLE) ×3 IMPLANT
NEEDLE SPNL 18GX3.5 QUINCKE PK (NEEDLE) IMPLANT
NS IRRIG 1000ML POUR BTL (IV SOLUTION) ×7 IMPLANT
OIL CARTRIDGE MAESTRO DRILL (MISCELLANEOUS) ×3
PACK CRANIOTOMY CUSTOM (CUSTOM PROCEDURE TRAY) ×3 IMPLANT
PAD DRESSING TELFA 3X8 NADH (GAUZE/BANDAGES/DRESSINGS) IMPLANT
PATTIES SURGICAL .25X.25 (GAUZE/BANDAGES/DRESSINGS) IMPLANT
PATTIES SURGICAL .5 X.5 (GAUZE/BANDAGES/DRESSINGS) ×2 IMPLANT
PATTIES SURGICAL .5 X3 (DISPOSABLE) IMPLANT
PATTIES SURGICAL 1/4 X 3 (GAUZE/BANDAGES/DRESSINGS) IMPLANT
PATTIES SURGICAL 1X1 (DISPOSABLE) IMPLANT
PIN MAYFIELD SKULL DISP (PIN) ×3 IMPLANT
PLATE 1.5/0.5 18.5MM BURR HOLE (Plate) ×4 IMPLANT
PLATE 1.5/0.5 22MM BURR HO (Plate) ×2 IMPLANT
RUBBERBAND STERILE (MISCELLANEOUS) ×4 IMPLANT
SCREW SELF DRILL HT 1.5/4MM (Screw) ×26 IMPLANT
SET CARTRIDGE AND TUBING (SET/KITS/TRAYS/PACK) ×2 IMPLANT
SET TUBING W/EXT DISP (INSTRUMENTS) ×1 IMPLANT
SPECIMEN JAR SMALL (MISCELLANEOUS) IMPLANT
SPONGE NEURO XRAY DETECT 1X3 (DISPOSABLE) IMPLANT
SPONGE SURGIFOAM ABS GEL 100 (HEMOSTASIS) ×3 IMPLANT
STAPLER VISISTAT 35W (STAPLE) ×5 IMPLANT
STOCKINETTE 6  STRL (DRAPES) ×2
STOCKINETTE 6 STRL (DRAPES) IMPLANT
SUT ETHILON 3 0 FSL (SUTURE) IMPLANT
SUT ETHILON 3 0 PS 1 (SUTURE) IMPLANT
SUT NURALON 4 0 TR CR/8 (SUTURE) ×7 IMPLANT
SUT SILK 0 TIES 10X30 (SUTURE) IMPLANT
SUT VIC AB 0 CT1 18XCR BRD8 (SUTURE) ×2 IMPLANT
SUT VIC AB 0 CT1 8-18 (SUTURE) ×3
SUT VIC AB 3-0 SH 8-18 (SUTURE) ×6 IMPLANT
TAPE CLOTH 1X10 TAN NS (GAUZE/BANDAGES/DRESSINGS) ×3 IMPLANT
TIP STANDARD 36KHZ (INSTRUMENTS) ×3
TIP STD 36KHZ (INSTRUMENTS) IMPLANT
TIP STRAIGHT 25KHZ (INSTRUMENTS) ×1 IMPLANT
TOWEL GREEN STERILE (TOWEL DISPOSABLE) ×3 IMPLANT
TOWEL GREEN STERILE FF (TOWEL DISPOSABLE) ×3 IMPLANT
TRAY FOLEY MTR SLVR 16FR STAT (SET/KITS/TRAYS/PACK) ×3 IMPLANT
TUBE CONNECTING 12'X1/4 (SUCTIONS) ×1
TUBE CONNECTING 12X1/4 (SUCTIONS) ×2 IMPLANT
UNDERPAD 30X30 (UNDERPADS AND DIAPERS) ×3 IMPLANT
WATER STERILE IRR 1000ML POUR (IV SOLUTION) ×3 IMPLANT

## 2017-05-12 NOTE — Telephone Encounter (Signed)
I read Timothy Obrien the CT scan report impression.

## 2017-05-12 NOTE — Telephone Encounter (Signed)
-----   Message from Curt Bears, MD sent at 05/11/2017  1:46 PM EDT ----- Regarding: RE: appt I would see if I can see him in the hospital otherwise we Timothy Obrien arrange follow-up visit after he gets better. ----- Message ----- From: Ardeen Garland, RN Sent: 05/11/2017  12:43 PM To: Curt Bears, MD Subject: appt                                           Received call from [facility] stating that Timothy Obrien is having a craniotomy tomorrow for new brain mets & trouble walking.    She states that he had blood work prior to CT & pre-op & next visit with Dr Julien Nordmann is 5/20 but she is concerned about him being winded with minimal exertion & wonders if he needs to be seen sooner.    She also asked if Dr Julien Nordmann makes rounds in the hospital & would this be an option to be seen in the hospital which would be easier for pt.  She reports that the pt is on decadron.  Her call back # is [phone].  Message routed to Dr Mohamed/Pod RN   Let me know what I need to do? Diane

## 2017-05-12 NOTE — Anesthesia Preprocedure Evaluation (Signed)
Anesthesia Evaluation  Patient identified by MRN, date of birth, ID band Patient awake    Reviewed: Allergy & Precautions, NPO status , Patient's Chart, lab work & pertinent test results  Airway Mallampati: I  TM Distance: >3 FB Neck ROM: Full    Dental   Pulmonary former smoker,    Pulmonary exam normal        Cardiovascular hypertension, Pt. on medications Normal cardiovascular exam     Neuro/Psych Anxiety Depression    GI/Hepatic   Endo/Other    Renal/GU      Musculoskeletal   Abdominal   Peds  Hematology   Anesthesia Other Findings   Reproductive/Obstetrics                             Anesthesia Physical Anesthesia Plan  ASA: III  Anesthesia Plan: General   Post-op Pain Management:    Induction:   PONV Risk Score and Plan:   Airway Management Planned: Oral ETT  Additional Equipment: Arterial line  Intra-op Plan:   Post-operative Plan: Extubation in OR  Informed Consent: I have reviewed the patients History and Physical, chart, labs and discussed the procedure including the risks, benefits and alternatives for the proposed anesthesia with the patient or authorized representative who has indicated his/her understanding and acceptance.     Plan Discussed with: CRNA and Surgeon  Anesthesia Plan Comments:         Anesthesia Quick Evaluation

## 2017-05-12 NOTE — Anesthesia Procedure Notes (Signed)
Arterial Line Insertion Start/End5/03/2017 10:00 AM, 05/12/2017 10:10 AM Performed by: Kyung Rudd, CRNA, CRNA  Patient location: Pre-op. Preanesthetic checklist: patient identified, IV checked, site marked, risks and benefits discussed, surgical consent, monitors and equipment checked, pre-op evaluation, timeout performed and anesthesia consent Lidocaine 1% used for infiltration Left, radial was placed Catheter size: 20 G Hand hygiene performed  and maximum sterile barriers used  Allen's test indicative of satisfactory collateral circulation Attempts: 1 Procedure performed without using ultrasound guided technique. Ultrasound Notes:anatomy identified Following insertion, dressing applied and Biopatch. Post procedure assessment: normal  Patient tolerated the procedure well with no immediate complications.

## 2017-05-12 NOTE — Op Note (Signed)
PREOP DIAGNOSIS:  Recurrent left frontal tumor   POSTOP DIAGNOSIS: Same  PROCEDURE: 1. Stereotactic left frontal craniotomy for resection of tumor  SURGEON: Dr. Consuella Lose, MD  ASSISTANT: Ferne Reus, PA-C  ANESTHESIA: General Endotracheal  EBL: 50cc  SPECIMENS: left frontal tumor  DRAINS: None  COMPLICATIONS: None immediate  CONDITION: Hemodynamically stable to PACU  HISTORY: Timothy Obrien is a 57 y.o. male with a history of metastatic non-small cell lung cancer, who previously underwent resection followed by stereotactic radiosurgery to a left frontal lesion.  This was back in 2016.  Lesion has been followed by routine MRI scans which have demonstrated progressive enlargement with surrounding edema.  His case was discussed at the multidisciplinary tumor board where surgical resection was recommended for both diagnosis and treatment.  He did also have his case reviewed at [facility] for the possibility of laser interstitial thermal therapy for which she was not a candidate.  He therefore elected to proceed with surgical resection.  The risks and benefits of the surgery were reviewed in detail with the patient and his wife.  After all questions were answered informed consent was obtained and witnessed.  PROCEDURE IN DETAIL: The patient was brought to the operating room. After induction of general anesthesia, the patient was positioned on the operative table in the [place] head holder in the supine position with the head turned to the right. All pressure points were meticulously padded.  Preoperative stereotactic MRI scan was then co-registered to surface markers until that satisfactory accuracy was achieved.  The previous skin incision was marked out.  Skin incision was then prepped and draped in the usual sterile fashion.  After timeout was conducted, the incision was infiltrated with local anesthetic with epinephrine.  Incision was then made sharply down through the galea.   Bovie electrocautery was used then to divide the galea and elevate the periosteum.  Hemostasis on the skin edges were secured with Raney clips.  The horseshoe-shaped flap was based medially and reflected.  The previous craniotomy with plating was identified.  The previous plates were removed.  High-speed drill was used to disconnect the previous flap.  The craniotomy flap was then elevated.  Hemostasis on the dura was secured with morselized Gelfoam and thrombin.  The stereotactic system was used to confirm adequate access to the underlying tumor which did appear to be within the precentral gyrus.  The dura was then opened in a horseshoe fashion based medially.  There were significant pial adhesions to the dura which were taken down with a combination of microscissors and bipolar electrocautery.  At this point the microscope was draped sterilely and brought into the field, and the remainder of the case was done under the microscope using microdissection.  The stereotactic system was used to identify the tumor just underneath the surface of the cortex.  There did appear to be a draining vein coursing towards the superior sagittal sinus directly overlying the tumor.  This area did also appear slightly yellowish in color.  Using the stereotactic system, I identified what appeared to be the central sulcus and the precentral gyrus.  I then proceeded to make a corticectomy with the bipolar electrocautery and microscissors in front of the precentral gyrus.  Using the [facility] on minimal settings, the white underlying white matter was dissected.  Identified underlying tumor which appeared to be much more yellow in color, and significantly more firm in consistency.  Using the ultrasonic aspirator, the surrounding white matter was dissected until the tumor was  circumferentially dissected and adherent only to the peel surface.  At this point it became clear that in order to remove the tumor the overlying draining veins would  have to be sacrificed.  They were bipolared and divided.  The tumor was then removed and sent for permanent pathology.  The resection cavity was then inspected.  I did remove a small amount of remaining firm slightly yellowish tinged white matter along the posterior margin of the resection cavity.  Hemostasis was then secured with a combination of bipolar electrocautery and morselized Gelfoam and thrombin.  The dura was then reapproximated with interrupted 4-0 Nurolon stitches.  Hemostasis was secured with morselized Gelfoam with thrombin underneath the bone flap edges.  A piece of DuraGen was then placed as a dural onlay graft.  Bone flap was then replaced and plated with standard titanium plates and screws.  The galea was then closed with interrupted 0 and 3-0 Vicryl stitches and the skin was closed with skin staples.  Bacitracin ointment and sterile dressings were applied.  The patient was removed from the [place] head holder and transferred to the stretcher.  He was extubated, and taken to the postanesthesia care unit in stable hemodynamic condition.  At the end of the case all sponge, needle, instrument, and cottonoid counts were correct.

## 2017-05-12 NOTE — H&P (Signed)
Chief Complaint   Brain tumor  HPI   HPI: Timothy Obrien is a 57 y.o. male who has previously undergone preoperative stereotactic radiosurgery followed by left frontoparietal craniotomy for resection of lung metastatic adenocarcinoma back in December of 2016.  Follow-up MRI scans have demonstrated recent enlargement of the same left posterior frontal metastasis.  He has also developed right leg weakness.  He has recently undergone functional MRI scan.  Based on imaging, it does appear to be within the precentral gyrus.  This is consistent with findings on functional MRI which also appeared to indicate leg motor within the region of the tumor. Continues to endorse RLE weakness. Denies LLE or BUE weakness. He presents today for surgery and is without any concerns.  Patient Active Problem List   Diagnosis Date Noted  . Right leg weakness 01/12/2017  . Depression 02/11/2016  . Hypertension 12/31/2015  . Chronic fatigue 09/17/2015  . Shoulder pain, left 08/06/2015  . Encounter for antineoplastic immunotherapy 03/12/2015  . Malnutrition of moderate degree 01/17/2015  . S/P craniotomy 12/26/2014  . Brain metastasis ([place]) 12/26/2014  . Primary cancer of left lower lobe of lung ([place]) 12/22/2014  . Right sided weakness 12/12/2014  . Tobacco abuse     PMH: Past Medical History:  Diagnosis Date  . Anxiety   . Bronchitis   . Chronic fatigue 09/17/2015  . Depression 02/11/2016  . Hypertension 12/31/2015   states never been told  . Lung cancer ([place])    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  . Pneumonia   . Seizures ([place])    last 12/11/14  . Shortness of breath dyspnea    with exertion  . Tobacco abuse     PSH: Past Surgical History:  Procedure Laterality Date  . APPLICATION OF CRANIAL NAVIGATION N/A 12/26/2014   Procedure: APPLICATION OF CRANIAL NAVIGATION;  Surgeon: Kevan Ny Ditty, MD;  Location: [place] [facility];  Service: Neurosurgery;  Laterality: N/A;  . CRANIOTOMY N/A  12/26/2014   Procedure: CRANIOTOMY TUMOR EXCISION with BrainLab;  Surgeon: Kevan Ny Ditty, MD;  Location: [place] [facility]UR[facility];  Service: Neurosurgery;  Laterality: N/A;  CRANIOTOMY TUMOR EXCISION with Stealth  . HERNIA REPAIR    . VASECTOMY    . VIDEO ASSISTED THORACOSCOPY (VATS)/WEDGE RESECTION Left 01/14/2015   Procedure: VIDEO ASSISTED THORACOSCOPY (VATS)/WEDGE RESECTION, Superior segmentectomy left  lower lobe, wedge resection of left upper lobe, multiple lymph node disection, On Q insertion.;  Surgeon: Grace Isaac, MD;  Location: [place];  Service: Thoracic;  Laterality: Left;  Marland Kitchen VIDEO BRONCHOSCOPY Bilateral 12/16/2014   Procedure: VIDEO BRONCHOSCOPY WITH FLUORO;  Surgeon: Collene Gobble, MD;  Location: [facility];  Service: Cardiopulmonary;  Laterality: Bilateral;  . VIDEO BRONCHOSCOPY N/A 01/14/2015   Procedure: VIDEO BRONCHOSCOPY;  Surgeon: Grace Isaac, MD;  Location: [facility] OR;  Service: Thoracic;  Laterality: N/A;    Medications Prior to Admission  Medication Sig Dispense Refill Last Dose  . ALPRAZolam (XANAX) 0.25 MG tablet Take 0.25 mg by mouth daily as needed for anxiety.    Past Month at Unknown time  . dexamethasone (DECADRON) 4 MG tablet Take 1 tablet (4 mg total) by mouth daily. 30 tablet 0 05/12/2017 at 0630  . mirtazapine (REMERON) 30 MG tablet Take 1 tablet (30 mg total) by mouth at bedtime. 90 tablet 0 05/11/2017 at Unknown time  . pentoxifylline (TRENTAL) 400 MG CR tablet Take 1 tablet (400 mg total) by mouth 2 (two) times daily after a meal.  60 tablet 5 Past Week at Unknown time  . vitamin E 400 UNIT capsule Take 400 Units by mouth 2 (two) times daily.   Past Week at Unknown time    SH: Social History   Tobacco Use  . Smoking status: Former Smoker    Packs/day: 1.00    Years: 36.00    Pack years: 36.00    Types: Cigarettes    Start date: 12/14/1979    Last attempt to quit: 10/07/2015    Years since quitting: 1.5  . Smokeless tobacco: Current User  . Tobacco  comment: Vapes: Few cigars a day. Quit cigarettes after easter.  Substance Use Topics  . Alcohol use: Yes    Alcohol/week: 12.6 oz    Types: 21 Cans of beer per week    Comment: 3 beers a day   . Drug use: Yes    Types: Marijuana    Comment: last time 01/04/15    MEDS: Prior to Admission medications   Medication Sig Start Date End Date Taking? Authorizing Provider  ALPRAZolam Duanne Moron) 0.25 MG tablet Take 0.25 mg by mouth daily as needed for anxiety.  08/03/16  Yes [provider]  dexamethasone (DECADRON) 4 MG tablet Take 1 tablet (4 mg total) by mouth daily. 05/08/17  Yes Vaslow, Acey Lav, MD  mirtazapine (REMERON) 30 MG tablet Take 1 tablet (30 mg total) by mouth at bedtime. 09/13/16  Yes Curt Bears, MD  pentoxifylline (TRENTAL) 400 MG CR tablet Take 1 tablet (400 mg total) by mouth 2 (two) times daily after a meal. 12/30/16  Yes Bruning, Ashlyn, PA-C  vitamin E 400 UNIT capsule Take 400 Units by mouth 2 (two) times daily.   Yes [provider]    ALLERGY: No Known Allergies  Social History   Tobacco Use  . Smoking status: Former Smoker    Packs/day: 1.00    Years: 36.00    Pack years: 36.00    Types: Cigarettes    Start date: 12/14/1979    Last attempt to quit: 10/07/2015    Years since quitting: 1.5  . Smokeless tobacco: Current User  . Tobacco comment: Vapes: Few cigars a day. Quit cigarettes after easter.  Substance Use Topics  . Alcohol use: Yes    Alcohol/week: 12.6 oz    Types: 21 Cans of beer per week    Comment: 3 beers a day      Family History  Problem Relation Age of Onset  . Breast cancer Mother   . Colon polyps Mother   . Arthritis Father      ROS   ROS  Exam   Vitals:   05/12/17 0928  BP: 125/86  Pulse: 76  Resp: 20  Temp: 98.4 F (36.9 C)  SpO2: 96%   General appearance: WDWN, NAD Eyes: PERRL, Fundoscopic: normal Cardiovascular: Regular rate and rhythm without murmurs, rubs, gallops. No edema or variciosities.  Distal pulses normal. Pulmonary: Clear to auscultation Musculoskeletal:     Muscle tone upper extremities: Normal    Muscle tone lower extremities: Normal    Motor exam: Upper Extremities Deltoid Bicep Tricep Grip  Right 5/5 5/5 5/5 5/5  Left 5/5 5/5 5/5 5/5   Lower Extremity IP Quad PF DF EHL  Right 4/5 4/5 4/5 4/5 4/5  Left 5/5 5/5 5/5 5/5 5/5   Neurological Awake, alert, oriented Memory and concentration grossly intact Speech fluent, appropriate CNII: Visual fields normal CNIII/IV/VI: EOMI CNV: Facial sensation normal CNVII: Symmetric, normal strength CNVIII: Grossly normal  CNIX: Normal palate movement CNXI: Trap and SCM strength normal CN XII: Tongue protrusion normal Sensation grossly intact to LT DTR: Normal Coordination (finger/nose & heel/shin): Normal  Results - Imaging/Labs   No results found for this or any previous visit (from the past 48 hour(s)).  No results found.   Impression/Plan   57 y.o. male with enlarging left motor cortex lesion concerning for treatment failure and growth of residual metastatic lung adenocarcinoma.  Treatment options at this point would include repeat surgical resection, or possibly biopsy with laser interstitial thermal therapy but unfortunately, he went for consult for LITT and it was thought to be too large so he elected to pursue repeat surgical resection.  While in the office, the risks of craniotomy for resection including risk of postoperative worsening of leg or arm weakness, facial involvement, or potentially speech difficulty as the patient is left handed.  We also discussed risk of postoperative spinal fluid leak, infection, bleeding potentially life-threatening bleeding, seizures, and hydrocephalus.  General risks of anesthesia including heart attack, stroke, and blood clots were also discussed.  All their questions were answered. Wished to proceed. Consent is signed.

## 2017-05-12 NOTE — Transfer of Care (Signed)
Immediate Anesthesia Transfer of Care Note  Patient: Timothy Obrien  Procedure(s) Performed: STEREOTACTIC LEFT FRONTOPERIETIAL CRANIOTOMY FOR RESECTION OF TUMOR (Left Head) APPLICATION OF CRANIAL NAVIGATION (Left Head)  Patient Location: PACU  Anesthesia Type:General  Level of Consciousness: awake and alert   Airway & Oxygen Therapy: Patient Spontanous Breathing and Patient connected to nasal cannula oxygen  Post-op Assessment: Report given to RN, Post -op Vital signs reviewed and stable and Patient moving all extremities X 4  Post vital signs: Reviewed and stable  Last Vitals:  Vitals Value Taken Time  BP 139/67 05/12/2017  5:03 PM  Temp 36.4 C 05/12/2017  5:04 PM  Pulse 73 05/12/2017  5:12 PM  Resp 26 05/12/2017  5:12 PM  SpO2 97 % 05/12/2017  5:12 PM  Vitals shown include unvalidated device data.  Last Pain:  Vitals:   05/12/17 0952  TempSrc:   PainSc: 0-No pain         Complications: No apparent anesthesia complications

## 2017-05-12 NOTE — Anesthesia Procedure Notes (Signed)
Procedure Name: Intubation Date/Time: 05/12/2017 2:25 PM Performed by: Inda Coke, CRNA Pre-anesthesia Checklist: Patient identified, Emergency Drugs available, Suction available and Patient being monitored Patient Re-evaluated:Patient Re-evaluated prior to induction Oxygen Delivery Method: Circle System Utilized Preoxygenation: Pre-oxygenation with 100% oxygen Induction Type: IV induction Ventilation: Mask ventilation without difficulty Laryngoscope Size: Mac and 3 Grade View: Grade III Tube type: Oral Tube size: 8.0 mm Number of attempts: 1 Airway Equipment and Method: Stylet Placement Confirmation: ETT inserted through vocal cords under direct vision,  positive ETCO2 and breath sounds checked- equal and bilateral Secured at: 22 cm Tube secured with: Tape Dental Injury: Teeth and Oropharynx as per pre-operative assessment  Comments: Performed by Sydnee Levans

## 2017-05-13 ENCOUNTER — Inpatient Hospital Stay (HOSPITAL_COMMUNITY): Payer: BLUE CROSS/BLUE SHIELD

## 2017-05-13 ENCOUNTER — Other Ambulatory Visit: Payer: Self-pay

## 2017-05-13 LAB — BASIC METABOLIC PANEL
Anion gap: 9 (ref 5–15)
BUN: 12 mg/dL (ref 6–20)
CO2: 23 mmol/L (ref 22–32)
Calcium: 8.4 mg/dL — ABNORMAL LOW (ref 8.9–10.3)
Chloride: 106 mmol/L (ref 101–111)
Creatinine, Ser: 1.1 mg/dL (ref 0.61–1.24)
GFR calc Af Amer: >60 mL/min (ref >60)
GFR calc non Af Amer: >60 mL/min (ref >60)
Glucose, Bld: 138 mg/dL — ABNORMAL HIGH (ref 65–99)
Potassium: 4.6 mmol/L (ref 3.5–5.1)
Sodium: 138 mmol/L (ref 135–145)

## 2017-05-13 LAB — CBC WITH DIFFERENTIAL/PLATELET
Basophils Absolute: 0 10*3/uL (ref 0.0–0.1)
Basophils Relative: 0 %
Eosinophils Absolute: 0 10*3/uL (ref 0.0–0.7)
Eosinophils Relative: 0 %
HCT: 41.4 % (ref 39.0–52.0)
Hemoglobin: 13.6 g/dL (ref 13.0–17.0)
Lymphocytes Relative: 6 %
Lymphs Abs: 1 10*3/uL (ref 0.7–4.0)
MCH: 32.4 pg (ref 26.0–34.0)
MCHC: 32.9 g/dL (ref 30.0–36.0)
MCV: 98.6 fL (ref 78.0–100.0)
Monocytes Absolute: 0.8 10*3/uL (ref 0.1–1.0)
Monocytes Relative: 5 %
Neutro Abs: 15.1 10*3/uL — ABNORMAL HIGH (ref 1.7–7.7)
Neutrophils Relative %: 89 %
Platelets: 285 10*3/uL (ref 150–400)
RBC: 4.2 MIL/uL — ABNORMAL LOW (ref 4.22–5.81)
RDW: 14.3 % (ref 11.5–15.5)
WBC: 16.9 10*3/uL — ABNORMAL HIGH (ref 4.0–10.5)

## 2017-05-13 LAB — MRSA PCR SCREENING: MRSA BY PCR: NEGATIVE

## 2017-05-13 MED ORDER — DEXAMETHASONE 4 MG PO TABS: 4.0000 mg | ORAL_TABLET | Freq: Every day | ORAL | 0 refills | Status: DC

## 2017-05-13 MED ORDER — ACETAMINOPHEN-CODEINE #3 300-30 MG PO TABS: 1.0000 | ORAL_TABLET | Freq: Four times a day (QID) | ORAL | 0 refills | Status: DC | PRN

## 2017-05-13 MED ORDER — LEVETIRACETAM 500 MG PO TABS: 500.0000 mg | ORAL_TABLET | Freq: Two times a day (BID) | ORAL | 1 refills | Status: DC

## 2017-05-13 MED ORDER — GADOBENATE DIMEGLUMINE 529 MG/ML IV SOLN
17.0000 mL | Freq: Once | INTRAVENOUS | Status: AC | PRN
  Administered 2017-05-13: 17 mL via INTRAVENOUS

## 2017-05-13 NOTE — Discharge Instructions (Signed)
Craniotomy °Care After °Please read the instructions outlined below and refer to this sheet in the next few weeks. These discharge instructions provide you with general information on caring for yourself after you leave the hospital. Your surgeon may also give you specific instructions. While your treatment has been planned according to the most current medical practices available, unavoidable complications occasionally occur. If you have any problems or questions after discharge, please call your surgeon. °Although there are many types of brain surgery, recovery following craniotomy (surgical opening of the skull) is much the same for each. However, recovery depends on many factors. These include the type and severity of brain injury and the type of surgery. It also depends on any nervous system function problems (neurological deficits) before surgery. If the craniotomy was done for cancer, chemotherapy and radiation could follow. You could be in the hospital from 5 days to a couple weeks. This depends on the type of surgery, findings, and whether there are complications. °HOME CARE INSTRUCTIONS  °· It is not unusual to hear a clicking noise after a craniotomy, the plates and screws used to attach the bone flap can sometimes cause this. It is a normal occurrence if this does happen °· Do not drive for 10 days after the operation °· Your scalp may feel spongy for a while, because of fluid under it. This will gradually get better. Occasionally, the surgeon will not replace the bone that was removed to access the brain. If there is a bony defect, the surgeon will ask you to wear a helmet for protection. This is a discussion you should have with your surgeon prior to leaving the hospital (discharge). °· Numbness may persist in some areas of your scalp. °· Take all medications as directed. Sometimes steroids to control swelling are prescribed. Anticonvulsants to prevent seizures may also be given. Do not use alcohol,  other drugs, or medications unless your surgeon says it is OK. °· Keep the wound dry and clean. The wound may be washed gently with soap and water. Then, you may gently blot or dab it dry, without rubbing. Do not take baths, use swimming pools or hot tubs for 10 days, or as instructed by your caregiver. It is best to wait to see you surgeon at your first postoperative visit, and to get directions at that time. °· Only take over-the-counter or prescription medicines for pain, discomfort, or fever as directed by your caregiver. °· You may continue your normal diet, as directed. °· Walking is OK for exercise. Wait at least 3 months before you return to mild, non-contact sports or as your surgeon suggests. Contact sports should be avoided for at least 1 year, unless your surgeon says it is OK. °· If you are prescribed steroids, take them exactly as prescribed. If you start having a decrease in nervous system functions (neurological deficits) and headaches as the dose of steroids is reduced, tell your surgeon right away. °· When the anticonvulsant prescription is finished you no longer need to take it. °SEEK IMMEDIATE MEDICAL CARE IF:  °· You develop nausea, vomiting, severe headaches, confusion, or you have a seizure. °· You develop chest pain, a stiff neck, or difficulty breathing. °· There is redness, swelling, or increasing pain in the wound or pin insertion sites. °· You have an increase in swelling or bruising around the eyes. °· There is drainage or pus coming from the wound. °· You have an oral temperature above 102° F (38.9° C), not controlled by medicine. °·   You notice a foul smell coming from the wound or dressing. °· The wound breaks open (edges not staying together) after the stitches have been removed. °· You develop dizziness or fainting while standing. °· You develop a rash. °· You develop any reaction or side effects to the medications given. °Document Released: 03/29/2005 Document Revised: 03/21/2011  Document Reviewed: 01/05/2009 °Exit[employer]® Patient Information ©2013 [employer]. ° °

## 2017-05-13 NOTE — Progress Notes (Signed)
Pt has ambulated unit. States gait is baseline. Denies any pain. PIVs removed per discharge protocol. Family at bedside during review of discharge instructions. Pt and family did not have any questions at this time.

## 2017-05-13 NOTE — Anesthesia Postprocedure Evaluation (Signed)
Anesthesia Post Note  Patient: Timothy Obrien  Procedure(s) Performed: STEREOTACTIC LEFT FRONTOPERIETIAL CRANIOTOMY FOR RESECTION OF TUMOR (Left Head) APPLICATION OF CRANIAL NAVIGATION (Left Head)     Patient location during evaluation: PACU Anesthesia Type: General Level of consciousness: awake and alert Pain management: pain level controlled Vital Signs Assessment: post-procedure vital signs reviewed and stable Respiratory status: spontaneous breathing, nonlabored ventilation, respiratory function stable and patient connected to nasal cannula oxygen Cardiovascular status: blood pressure returned to baseline and stable Postop Assessment: no apparent nausea or vomiting Anesthetic complications: no    Last Vitals:  Vitals:   05/13/17 0707 05/13/17 0800  BP: 128/85   Pulse: 60 (!) 58  Resp: 14 13  Temp:  (!) 36.2 C  SpO2: 99% 96%    Last Pain:  Vitals:   05/13/17 1050  TempSrc:   PainSc: 3                  Raksha Wolfgang S

## 2017-05-13 NOTE — Discharge Summary (Signed)
Physician Discharge Summary  Patient ID: Timothy Obrien MRN: [id number] DOB/AGE: 56-Nov-1962 56 y.o.  Admit date: 05/12/2017 Discharge date: 05/13/2017  Admission Diagnoses:Brain tumor, metastatic  Discharge Diagnoses:  Active Problems:   Brain tumor [facility])   Discharged Condition: good  Hospital Course: Mr. Heikkila was admitted and taken to the operating room for an uncomplicated craniotomy and tumor resection. He is alert, oriented x 4, and without neurological deficits. Wounds are clean,and dry.  Treatments: surgery: Stereotactic left frontal craniotomy for resection of tumor    Discharge Exam: Blood pressure 125/69, pulse 74, temperature 98.4 F (36.9 C), temperature source Oral, resp. rate 16, height 6' (1.829 m), SpO2 95 %. General appearance: alert, cooperative, appears stated age and no distress Neurologic: Alert and oriented X 3, normal strength and tone. Normal symmetric reflexes. Normal coordination and gait  Disposition: Discharge disposition: 01-Home or Self Care      METASTATIC ADENOCARCINOMA TO BRAIN  Allergies as of 05/13/2017   No Known Allergies     Medication List    TAKE these medications   acetaminophen-codeine 300-30 MG tablet Commonly known as:  TYLENOL #3 Take 1 tablet by mouth every 6 (six) hours as needed for moderate pain.   ALPRAZolam 0.25 MG tablet Commonly known as:  XANAX Take 0.25 mg by mouth daily as needed for anxiety.   dexamethasone 4 MG tablet Commonly known as:  DECADRON Take 1 tablet (4 mg total) by mouth daily.   levETIRAcetam 500 MG tablet Commonly known as:  KEPPRA Take 1 tablet (500 mg total) by mouth every 12 (twelve) hours.   mirtazapine 30 MG tablet Commonly known as:  REMERON Take 1 tablet (30 mg total) by mouth at bedtime.   pentoxifylline 400 MG CR tablet Commonly known as:  TRENTAL Take 1 tablet (400 mg total) by mouth 2 (two) times daily after a meal.   vitamin E 400 UNIT capsule Take 400 Units by mouth 2  (two) times daily.      Follow-up Information    Consuella Lose, MD. Call in 2 week(s).   Specialty:  Neurosurgery Why:  please call the office to make an appointment Contact information: [address] [employer] [address] [place] [address] [phone]           Signed: Winfield Cunas 05/13/2017, 12:45 PM

## 2017-05-15 ENCOUNTER — Ambulatory Visit: Payer: BLUE CROSS/BLUE SHIELD | Admitting: Internal Medicine

## 2017-05-15 ENCOUNTER — Encounter (HOSPITAL_COMMUNITY): Payer: Self-pay | Admitting: Neurosurgery

## 2017-05-15 MED FILL — Thrombin For Soln 20000 Unit: CUTANEOUS | Qty: 1 | Status: AC

## 2017-05-15 MED FILL — Thrombin For Soln 5000 Unit: CUTANEOUS | Qty: 5000 | Status: AC

## 2017-05-23 ENCOUNTER — Other Ambulatory Visit: Payer: Self-pay | Admitting: *Deleted

## 2017-05-23 ENCOUNTER — Inpatient Hospital Stay: Payer: BLUE CROSS/BLUE SHIELD | Attending: Internal Medicine | Admitting: Internal Medicine

## 2017-05-23 ENCOUNTER — Encounter: Payer: Self-pay | Admitting: Internal Medicine

## 2017-05-23 VITALS — BP 133/96 | HR 98 | Temp 98.3°F | Ht 72.0 in | Wt 186.4 lb

## 2017-05-23 DIAGNOSIS — R29898 Other symptoms and signs involving the musculoskeletal system: Secondary | ICD-10-CM

## 2017-05-23 DIAGNOSIS — R531 Weakness: Secondary | ICD-10-CM | POA: Diagnosis not present

## 2017-05-23 DIAGNOSIS — C7931 Secondary malignant neoplasm of brain: Secondary | ICD-10-CM

## 2017-05-23 DIAGNOSIS — R5383 Other fatigue: Secondary | ICD-10-CM | POA: Insufficient documentation

## 2017-05-23 DIAGNOSIS — R2689 Other abnormalities of gait and mobility: Secondary | ICD-10-CM | POA: Diagnosis not present

## 2017-05-23 DIAGNOSIS — C3432 Malignant neoplasm of lower lobe, left bronchus or lung: Secondary | ICD-10-CM | POA: Insufficient documentation

## 2017-05-23 MED ORDER — DEXAMETHASONE 1 MG PO TABS: 1.0000 mg | ORAL_TABLET | Freq: Every day | ORAL | 0 refills | Status: DC

## 2017-05-23 NOTE — Progress Notes (Addendum)
[facility] [place], [place] [address] [phone]   Interval Patient Evaluation  Date of Service: 05/23/17 Patient Name: Timothy Obrien Patient MRN: [id number] Patient DOB: 1960/04/24 Provider: Ventura Sellers, MD  Identifying Statement:  Timothy Obrien is a 57 y.o. male with No primary diagnosis found.   Primary Cancer: NSCLC Stage IV  Oncologic History: 12/25/14: MRI demonstrates left frontal mestastasis, SRS is performed followed by resection. 12/23/16: Patient describes right leg weakness, some progression noted on MRI 05/12/17: Resection of left frontal progressive lesion.  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  Interval History:  Timothy Obrien presents today for follow up after resection of progressive left frontal lesion.  He describes relative preservation of functional status, with slight increase in right leg weakness.  No impact with right arm or hand function.  He also describes general fatigue, some indigestion.  No seizures or headaches.  Medications: Current Outpatient Medications on File Prior to Visit  Medication Sig Dispense Refill  . dexamethasone (DECADRON) 4 MG tablet Take 1 tablet (4 mg total) by mouth daily. Take two tablets a day for three days then stop. 30 tablet 0  . levETIRAcetam (KEPPRA) 500 MG tablet Take 1 tablet (500 mg total) by mouth every 12 (twelve) hours. 60 tablet 1  . mirtazapine (REMERON) 30 MG tablet Take 1 tablet (30 mg total) by mouth at bedtime. 90 tablet 0  . acetaminophen-codeine (TYLENOL #3) 300-30 MG tablet Take 1 tablet by mouth every 6 (six) hours as needed for moderate pain. (Patient not taking: Reported on 05/23/2017) 60 tablet 0  . ALPRAZolam (XANAX) 0.25 MG tablet Take 0.25 mg by mouth daily as needed for anxiety.     . pentoxifylline (TRENTAL) 400 MG CR tablet Take 1 tablet (400 mg total) by mouth 2 (two) times daily after a meal. (Patient not taking: Reported on 05/23/2017) 60  tablet 5  . vitamin E 400 UNIT capsule Take 400 Units by mouth 2 (two) times daily.     No current facility-administered medications on file prior to visit.     Allergies: No Known Allergies Past Medical History:  Past Medical History:  Diagnosis Date  . Anxiety   . Bronchitis   . Chronic fatigue 09/17/2015  . Depression 02/11/2016  . Hypertension 12/31/2015   states never been told  . 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 Pneumonia   . Seizures ([place])    last 12/11/14  . Shortness of breath dyspnea    with exertion  . Tobacco abuse    Past Surgical History:  Past Surgical History:  Procedure Laterality Date  . APPLICATION OF CRANIAL NAVIGATION N/A 12/26/2014   Procedure: APPLICATION OF CRANIAL NAVIGATION;  Surgeon: Kevan Ny Ditty, MD;  Location: [facility];  Service: Neurosurgery;  Laterality: N/A;  . APPLICATION OF CRANIAL NAVIGATION Left 05/12/2017   Procedure: APPLICATION OF CRANIAL NAVIGATION;  Surgeon: Consuella Lose, MD;  Location: [facility];  Service: Neurosurgery;  Laterality: Left;  APPLICATION OF CRANIAL NAVIGATION  . CRANIOTOMY N/A 12/26/2014   Procedure: CRANIOTOMY TUMOR EXCISION with BrainLab;  Surgeon: Kevan Ny Ditty, MD;  Location: [facility];  Service: Neurosurgery;  Laterality: N/A;  CRANIOTOMY TUMOR EXCISION with [facility]  . CRANIOTOMY Left 05/12/2017   Procedure: STEREOTACTIC LEFT FRONTOPERIETIAL CRANIOTOMY FOR RESECTION OF TUMOR;  Surgeon: Consuella Lose, MD;  Location: [facility];  Service: Neurosurgery;  Laterality: Left;  STEREOTACTIC  LEFT FRONTOPERIETIAL CRANIOTOMY FOR RESECTION OF TUMOR  . HERNIA REPAIR    . VASECTOMY    . VIDEO ASSISTED THORACOSCOPY (VATS)/WEDGE RESECTION Left 01/14/2015   Procedure: VIDEO ASSISTED THORACOSCOPY (VATS)/WEDGE RESECTION, Superior segmentectomy left  lower lobe, wedge resection of left upper lobe, multiple lymph node disection, On Q insertion.;  Surgeon: Grace Isaac, MD;  Location: [facility];   Service: Thoracic;  Laterality: Left;  Marland Kitchen VIDEO BRONCHOSCOPY Bilateral 12/16/2014   Procedure: VIDEO BRONCHOSCOPY WITH FLUORO;  Surgeon: Collene Gobble, MD;  Location: [facility];  Service: Cardiopulmonary;  Laterality: Bilateral;  . VIDEO BRONCHOSCOPY N/A 01/14/2015   Procedure: VIDEO BRONCHOSCOPY;  Surgeon: Grace Isaac, MD;  [facility];  Service: Thoracic;  Laterality: N/A;   Social History:  Social History   Socioeconomic History  . Marital status: Divorced    Spouse name: Not on file  . Number of children: Not on file  . Years of education: Not on file  . Highest education level: Not on file  Occupational History  . Not on file  Social Needs  . Financial resource strain: Not on file  . Food insecurity:    Worry: Not on file    Inability: Not on file  . Transportation needs:    Medical: Not on file    Non-medical: Not on file  Tobacco Use  . Smoking status: Former Smoker    Packs/day: 1.00    Years: 36.00    Pack years: 36.00    Types: Cigarettes    Start date: 12/14/1979    Last attempt to quit: 10/07/2015    Years since quitting: 1.6  . Smokeless tobacco: Current User  . Tobacco comment: Vapes: Few cigars a day. Quit cigarettes after easter.  Substance and Sexual Activity  . Alcohol use: Yes    Alcohol/week: 12.6 oz    Types: 21 Cans of beer per week    Comment: 3 beers a day   . Drug use: Yes    Types: Marijuana    Comment: last time 01/04/15  . Sexual activity: Not Currently  Lifestyle  . Physical activity:    Days per week: Not on file    Minutes per session: Not on file  . Stress: Not on file  Relationships  . Social connections:    Talks on phone: Not on file    Gets together: Not on file    Attends religious service: Not on file    Active member of club or organization: Not on file    Attends meetings of clubs or organizations: Not on file    Relationship status: Not on file  . Intimate partner violence:    Fear of current or ex partner:  Not on file    Emotionally abused: Not on file    Physically abused: Not on file    Forced sexual activity: Not on file  Other Topics Concern  . 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 He does have a cat but no other home pets. No mold exposure. Recent travel to [place] but with symptoms at the onset of travel.   Family History:  Family History  Problem Relation Age of Onset  . Breast cancer Mother   . Colon polyps Mother   . Arthritis Father     Review of Systems: Constitutional: Denies fevers, chills or abnormal weight loss Eyes: Denies blurriness of vision Ears, nose, mouth, throat, and  face: Denies mucositis or sore throat Respiratory: Denies cough, dyspnea or wheezes Cardiovascular: Denies palpitation, chest discomfort or lower extremity swelling Gastrointestinal:  Denies nausea, constipation, diarrhea GU: Denies dysuria or incontinence Skin: Denies abnormal skin rashes Neurological: Per HPI Musculoskeletal: Denies joint pain, back or neck discomfort. No decrease in ROM Behavioral/Psych: Denies anxiety, disturbance in thought content, and mood instability   Physical Exam: Vitals:   05/23/17 0911  BP: (!) 133/96  Pulse: 98  Temp: 98.3 F (36.8 C)  SpO2: 100%   KPS: 80. General: Alert, cooperative, pleasant, in no acute distress Head: Normal EENT: No conjunctival injection or scleral icterus. Oral mucosa moist Lungs: Resp effort normal Cardiac: Regular rate and rhythm Abdomen: Soft, non-distended abdomen Skin: No rashes cyanosis or petechiae. Extremities: No clubbing or edema  Neurologic Exam: Mental Status: Awake, alert, attentive to examiner. Oriented to self and environment. Language is fluent with intact comprehension.  Cranial Nerves: Visual acuity is grossly normal. Visual fields are full. Extra-ocular movements intact. No ptosis. Face is symmetric, tongue midline. Motor: Tone  and bulk are normal. Power is full in both arms, he is 4/5 in right leg. Reflexes are symmetric, no pathologic reflexes present. Intact finger to nose bilaterally Sensory: Intact to light touch and temperature Gait: Hemiparetic gait  Labs: I have reviewed the data as listed    Component Value Date/Time   NA 138 05/13/2017 0752   NA 137 01/12/2017 0846   K 4.6 05/13/2017 0752   K 4.4 01/12/2017 0846   CL 106 05/13/2017 0752   CO2 23 05/13/2017 0752   CO2 18 (L) 01/12/2017 0846   GLUCOSE 138 (H) 05/13/2017 0752   GLUCOSE 91 01/12/2017 0846   BUN 12 05/13/2017 0752   BUN 15.3 01/12/2017 0846   CREATININE 1.10 05/13/2017 0752   CREATININE 1.19 05/04/2017 1134   CREATININE 1.0 01/12/2017 0846   CALCIUM 8.4 (L) 05/13/2017 0752   CALCIUM 9.0 01/12/2017 0846   PROT 7.7 05/04/2017 1134   PROT 7.2 01/12/2017 0846   ALBUMIN 4.1 05/04/2017 1134   ALBUMIN 3.9 01/12/2017 0846   AST 25 05/04/2017 1134   AST 23 01/12/2017 0846   ALT 30 05/04/2017 1134   ALT 22 01/12/2017 0846   ALKPHOS 83 05/04/2017 1134   ALKPHOS 99 01/12/2017 0846   BILITOT 0.4 05/04/2017 1134   BILITOT 0.41 01/12/2017 0846   GFRNONAA >60 05/13/2017 0752   GFRNONAA >60 05/04/2017 1134   GFRAA >60 05/13/2017 0752   GFRAA >60 05/04/2017 1134   Lab Results  Component Value Date   WBC 16.9 (H) 05/13/2017   NEUTROABS 15.1 (H) 05/13/2017   HGB 13.6 05/13/2017   HCT 41.4 05/13/2017   MCV 98.6 05/13/2017   PLT 285 05/13/2017    Imaging:  Ct Chest W Contrast  Result Date: 05/05/2017 CLINICAL DATA:  Patient with history of stage IV non-small cell lung cancer. EXAM: CT CHEST, ABDOMEN, AND PELVIS WITH CONTRAST TECHNIQUE: Multidetector CT imaging of the chest, abdomen and pelvis was performed following the standard protocol during bolus administration of intravenous contrast. CONTRAST:  145m OMNIPAQUE IOHEXOL 300 MG/ML  SOLN COMPARISON:  CT CAP 02/10/2017 FINDINGS: CT CHEST FINDINGS Cardiovascular: Normal heart size.  Trace fluid superior pericardial recess. Aorta and main pulmonary artery normal in caliber. Mediastinum/Nodes: No enlarged axillary, mediastinal or hilar lymphadenopathy. Normal appearance of the esophagus. Lungs/Pleura: Central airways are patent. Stable 5 mm subpleural left lower lobe nodule (image 144; series 4). Stable postsurgical changes left hemithorax. Stable 1.4 cm nodule  medial left lower lobe (image 100; series 4). Centrilobular and paraseptal emphysematous changes. No pleural effusion or pneumothorax. Musculoskeletal: No aggressive or acute appearing osseous lesions. CT ABDOMEN PELVIS FINDINGS Hepatobiliary: Liver is normal in size and contour. Gallbladder is unremarkable. No intrahepatic or extrahepatic biliary ductal dilatation. Pancreas: Unremarkable Spleen: Unremarkable Adrenals/Urinary Tract: Adrenal glands are normal. Kidneys enhance symmetrically with contrast. Stable subcentimeter low-attenuation lesion superior pole right kidney. Urinary bladder is unremarkable. Stomach/Bowel: Sigmoid colonic diverticulosis. No CT evidence for acute diverticulitis. Normal morphology of the stomach. No evidence for small bowel obstruction. No free fluid or free intraperitoneal air. Vascular/Lymphatic: Normal caliber abdominal aorta. Peripheral calcified atherosclerotic plaque. No retroperitoneal lymphadenopathy. Reproductive: Prostate unremarkable. Other: Small fat containing left inguinal hernia. Musculoskeletal: Lumbar spine degenerative changes. No aggressive or acute appearing osseous lesions. IMPRESSION: 1. Stable appearance of the chest. No evidence for locally recurrent or metastatic disease in the chest, abdomen or pelvis. 2. Aortic Atherosclerosis (ICD10-I70.0) and Emphysema (ICD10-J43.9). Electronically Signed   By: Lovey Newcomer M.D.   On: 05/05/2017 09:41   Mr [name] JE Contrast  Result Date: 05/13/2017 CLINICAL DATA:  Tumor resection 05/12/2017. Personal history of lung cancer. Previously treated  left frontal lobe metastasis with progressive area of enhancement. EXAM: MRI HEAD WITHOUT AND WITH CONTRAST TECHNIQUE: Multiplanar, multiecho pulse sequences of the brain and surrounding structures were obtained without and with intravenous contrast. CONTRAST:  16m MULTIHANCE GADOBENATE DIMEGLUMINE 529 MG/ML IV SOLN COMPARISON:  Multiple prior brain MRI is including 04/18/2017 and 03/31/2017. FINDINGS: Brain: Left craniotomy is again noted. Posterior left frontal re-excision is evident. There is a fluid level within the resection cavity with mixed blood products as expected. There is a thin rim of residual enhancement along the medial and inferior aspect of the surgical cavity. Surrounding vasogenic edema is unchanged. No new areas of enhancement are present. The diffusion-weighted images demonstrate no acute infarct. There is no midline shift. Ventricles are normal size. The brainstem and cerebellum are normal. Vascular: Flow is present in the major intracranial arteries. Skull and upper cervical spine: The skull base is within normal limits. Scalp edema is noted following repeat craniotomy, within normal limits following recent surgery. Barium is otherwise unremarkable. Skull base is within normal limits. Sinuses/Orbits: A polyp or mucous retention cyst is again noted in the inferior right maxillary sinus. The remaining paranasal sinuses are clear. There is some fluid in the mastoid air cells bilaterally. No obstructing nasopharyngeal lesion is present. Rightward nasal septal deviation is present. Globes and orbits are within normal limits. IMPRESSION: 1. Interval resection progressive peripherally enhancing mass lesion. 2. Minimal enhancement along the medial and inferior rim of the resection cavity. This may represent residual unresected enhancing tissue versus reactive changes. This will serve as a baseline for follow-up exam. 3. No significant change in the extent of surrounding vasogenic edema or evidence  for acute infarct following surgery. 4. Blood products within the surgical cavity as expected. Electronically Signed   By: CSan MorelleM.D.   On: 05/13/2017 07:40   Ct Abdomen Pelvis W Contrast  Result Date: 05/05/2017 CLINICAL DATA:  Patient with history of stage IV non-small cell lung cancer. EXAM: CT CHEST, ABDOMEN, AND PELVIS WITH CONTRAST TECHNIQUE: Multidetector CT imaging of the chest, abdomen and pelvis was performed following the standard protocol during bolus administration of intravenous contrast. CONTRAST:  1073mOMNIPAQUE IOHEXOL 300 MG/ML  SOLN COMPARISON:  CT CAP 02/10/2017 FINDINGS: CT CHEST FINDINGS Cardiovascular: Normal heart size. Trace fluid superior pericardial recess. Aorta and  main pulmonary artery normal in caliber. Mediastinum/Nodes: No enlarged axillary, mediastinal or hilar lymphadenopathy. Normal appearance of the esophagus. Lungs/Pleura: Central airways are patent. Stable 5 mm subpleural left lower lobe nodule (image 144; series 4). Stable postsurgical changes left hemithorax. Stable 1.4 cm nodule medial left lower lobe (image 100; series 4). Centrilobular and paraseptal emphysematous changes. No pleural effusion or pneumothorax. Musculoskeletal: No aggressive or acute appearing osseous lesions. CT ABDOMEN PELVIS FINDINGS Hepatobiliary: Liver is normal in size and contour. Gallbladder is unremarkable. No intrahepatic or extrahepatic biliary ductal dilatation. Pancreas: Unremarkable Spleen: Unremarkable Adrenals/Urinary Tract: Adrenal glands are normal. Kidneys enhance symmetrically with contrast. Stable subcentimeter low-attenuation lesion superior pole right kidney. Urinary bladder is unremarkable. Stomach/Bowel: Sigmoid colonic diverticulosis. No CT evidence for acute diverticulitis. Normal morphology of the stomach. No evidence for small bowel obstruction. No free fluid or free intraperitoneal air. Vascular/Lymphatic: Normal caliber abdominal aorta. Peripheral calcified  atherosclerotic plaque. No retroperitoneal lymphadenopathy. Reproductive: Prostate unremarkable. Other: Small fat containing left inguinal hernia. Musculoskeletal: Lumbar spine degenerative changes. No aggressive or acute appearing osseous lesions. IMPRESSION: 1. Stable appearance of the chest. No evidence for locally recurrent or metastatic disease in the chest, abdomen or pelvis. 2. Aortic Atherosclerosis (ICD10-I70.0) and Emphysema (ICD10-J43.9). Electronically Signed   By: Lovey Newcomer M.D.   On: 05/05/2017 09:41    [facility] Clinician Interpretation: I have personally reviewed the radiological images as listed.  My interpretation, in the context of the patient's clinical presentation, is post-operative changes   Assessment/Plan 1. Brain metastasis ([place])  2. Right leg weakness  Mr. Borquez is clinically stable following resection of progressive treated metastatic lesion, which by pathology was radiation necrosis.  No further local therapy is indicated at this time.  We ask that he decrease decadron to 79m daily for 10 days, then 140mdaily for 10 days, then stop.  We strongly recommended continuation of physical therapy exercises to recover motor function in the right leg.  Will continue to benefit from rt AFO given noted foot drop and recent falls.  He should return to clinic in 3 months with an MRI for review.  We appreciate the opportunity to participate in the care of WiJOURDEN Obrien We will maintain open communication regarding future treatment pathways and formal follow up.  All questions were answered. The patient knows to call the clinic with any problems, questions or concerns. No barriers to learning were detected.  The total time spent in the encounter was 40 minutes and more than 50% was on counseling and review of test results   ZaVentura SellersMD Medical Director of Neuro-Oncology [facility] WeMitchell5/14/19 12:07 PM

## 2017-05-29 ENCOUNTER — Inpatient Hospital Stay (HOSPITAL_BASED_OUTPATIENT_CLINIC_OR_DEPARTMENT_OTHER): Payer: BLUE CROSS/BLUE SHIELD | Admitting: Internal Medicine

## 2017-05-29 ENCOUNTER — Encounter: Payer: Self-pay | Admitting: Internal Medicine

## 2017-05-29 ENCOUNTER — Telehealth: Payer: Self-pay | Admitting: Internal Medicine

## 2017-05-29 DIAGNOSIS — C3432 Malignant neoplasm of lower lobe, left bronchus or lung: Secondary | ICD-10-CM | POA: Diagnosis not present

## 2017-05-29 DIAGNOSIS — R5383 Other fatigue: Secondary | ICD-10-CM

## 2017-05-29 DIAGNOSIS — R2689 Other abnormalities of gait and mobility: Secondary | ICD-10-CM

## 2017-05-29 DIAGNOSIS — C3490 Malignant neoplasm of unspecified part of unspecified bronchus or lung: Secondary | ICD-10-CM

## 2017-05-29 DIAGNOSIS — C7931 Secondary malignant neoplasm of brain: Secondary | ICD-10-CM

## 2017-05-29 NOTE — Progress Notes (Signed)
[place] [phone]   [phone]  OFFICE PROGRESS NOTE  Briscoe Deutscher, MD [address] [place] [address]  DIAGNOSIS:  Stage IV (T2a, N1, M1b) non-small cell lung cancer, adenocarcinoma, negative EGFR mutation but equivocal EGFR amplification, negative ALK gene translocation and negative ROS 1 but with PDL-1 expression 100% presented with left lower lobe lung mass in addition to left hilar adenopathy and solitary metastatic brain lesion diagnosed in December 2016.  PRIOR THERAPY: 1) status post stereotactic radiotherapy and surgical resection diagnosed in December 2016. 2) status post video bronchoscopy with left VATS and wedge resection of the left upper lobe and left lower lobe superior segmentectomy with lymph node dissection under the care of Dr. Servando Snare on 01/14/2015. 3) First-line treatment with immunotherapy with Ketruda (pembrolizumab) 200 mg IV every 3 weeks status post 35 cycles. First cycle was given 02/18/2015.  Discontinued after the patient completed 2 years of treatment. 4) stereotactic left frontal craniotomy for resection of tumor and the final pathology was consistent with tumor necrosis.  This was done on 05/12/2017  CURRENT THERAPY: Observation.  INTERVAL HISTORY: Timothy Obrien 57 y.o. male returns to the clinic today for follow-up visit.  The patient is feeling fine today except for the fatigue and gait imbalance.  He underwent left frontal craniotomy with resection of suspicious tumor in that area on 05/12/2017 and the final pathology was consistent with tumor necrosis and no evidence of malignancy.  He is currently on a taper dose of Decadron and he has cushingoid features.  He has no nausea, vomiting, diarrhea or constipation.  He denied having any fever or chills.  He has no chest pain, shortness of breath, cough or hemoptysis.  The patient had a repeat CT scan of the chest, abdomen and pelvis performed recently and  he is here for evaluation and discussion of his discuss results.   MEDICAL HISTORY: Past Medical History:  Diagnosis Date  . Anxiety   . Bronchitis   . Chronic fatigue 09/17/2015  . Depression 02/11/2016  . Hypertension 12/31/2015   states never been told  . Lung cancer ([facility])    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  . Pneumonia   . Seizures ([place])    last 12/11/14  . Shortness of breath dyspnea    with exertion  . Tobacco abuse     ALLERGIES:  has No Known Allergies.  MEDICATIONS:  Current Outpatient Medications  Medication Sig Dispense Refill  . acetaminophen-codeine (TYLENOL #3) 300-30 MG tablet Take 1 tablet by mouth every 6 (six) hours as needed for moderate pain. (Patient not taking: Reported on 05/23/2017) 60 tablet 0  . ALPRAZolam (XANAX) 0.25 MG tablet Take 0.25 mg by mouth daily as needed for anxiety.     Derrill Memo ON 06/02/2017] dexamethasone (DECADRON) 1 MG tablet Take 1 tablet (1 mg total) by mouth daily. 30 tablet 0  . dexamethasone (DECADRON) 4 MG tablet Take 1 tablet (4 mg total) by mouth daily. Take two tablets a day for three days then stop. 30 tablet 0  . levETIRAcetam (KEPPRA) 500 MG tablet Take 1 tablet (500 mg total) by mouth every 12 (twelve) hours. 60 tablet 1  . mirtazapine (REMERON) 30 MG tablet Take 1 tablet (30 mg total) by mouth at bedtime. 90 tablet 0  . pentoxifylline (TRENTAL) 400 MG CR tablet Take 1 tablet (400 mg total) by mouth 2 (two) times daily after a meal. (Patient  not taking: Reported on 05/23/2017) 60 tablet 5  . vitamin E 400 UNIT capsule Take 400 Units by mouth 2 (two) times daily.     No current facility-administered medications for this visit.     SURGICAL HISTORY:  Past Surgical History:  Procedure Laterality Date  . APPLICATION OF CRANIAL NAVIGATION N/A 12/26/2014   Procedure: APPLICATION OF CRANIAL NAVIGATION;  Surgeon: Kevan Ny Ditty, MD;  Location: [facility];  Service: Neurosurgery;  Laterality: N/A;  . APPLICATION OF  CRANIAL NAVIGATION Left 05/12/2017   Procedure: APPLICATION OF CRANIAL NAVIGATION;  Surgeon: Consuella Lose, MD;  Location: [facility];  Service: Neurosurgery;  Laterality: Left;  APPLICATION OF CRANIAL NAVIGATION  . CRANIOTOMY N/A 12/26/2014   Procedure: CRANIOTOMY TUMOR EXCISION with [facility];  Surgeon: Kevan Ny Ditty, MD;  Location: [facility];  Service: Neurosurgery;  Laterality: N/A;  CRANIOTOMY TUMOR EXCISION with [facility]  . CRANIOTOMY Left 05/12/2017   Procedure: STEREOTACTIC LEFT FRONTOPERIETIAL CRANIOTOMY FOR RESECTION OF TUMOR;  Surgeon: Consuella Lose, MD;  Location: [facility];  Service: Neurosurgery;  Laterality: Left;  STEREOTACTIC LEFT FRONTOPERIETIAL CRANIOTOMY FOR RESECTION OF TUMOR  . HERNIA REPAIR    . VASECTOMY    . VIDEO ASSISTED THORACOSCOPY (VATS)/WEDGE RESECTION Left 01/14/2015   Procedure: VIDEO ASSISTED THORACOSCOPY (VATS)/WEDGE RESECTION, Superior segmentectomy left  lower lobe, wedge resection of left upper lobe, multiple lymph node disection, On Q insertion.;  Surgeon: Grace Isaac, MD;  Location: [facility];  Service: Thoracic;  Laterality: Left;  Marland Kitchen VIDEO BRONCHOSCOPY Bilateral 12/16/2014   Procedure: VIDEO BRONCHOSCOPY WITH FLUORO;  Surgeon: Collene Gobble, MD;  Location: [facility];  Service: Cardiopulmonary;  Laterality: Bilateral;  . VIDEO BRONCHOSCOPY N/A 01/14/2015   Procedure: VIDEO BRONCHOSCOPY;  Surgeon: Grace Isaac, MD;  Location: [facility];  Service: Thoracic;  Laterality: N/A;    REVIEW OF SYSTEMS:  Constitutional: positive for fatigue Eyes: negative Ears, nose, mouth, throat, and face: negative Respiratory: negative Cardiovascular: negative Gastrointestinal: negative Genitourinary:negative Integument/breast: negative Hematologic/lymphatic: negative Musculoskeletal:positive for muscle weakness Neurological: positive for gait problems Behavioral/Psych: negative Endocrine: negative Allergic/Immunologic: negative   PHYSICAL EXAMINATION: General  appearance: alert, cooperative, fatigued and no distress Head: Normocephalic, without obvious abnormality, atraumatic Neck: no adenopathy, no JVD, supple, symmetrical, trachea midline and thyroid not enlarged, symmetric, no tenderness/mass/nodules Lymph nodes: Cervical, supraclavicular, and axillary nodes normal. Resp: clear to auscultation bilaterally Back: symmetric, no curvature. ROM normal. No CVA tenderness. Cardio: regular rate and rhythm, S1, S2 normal, no murmur, click, rub or gallop GI: soft, non-tender; bowel sounds normal; no masses,  no organomegaly Extremities: extremities normal, atraumatic, no cyanosis or edema Neurologic: Alert and oriented X 3, normal strength and tone. Normal symmetric reflexes. Normal coordination and gait  ECOG PERFORMANCE STATUS: 1 - Symptomatic but completely ambulatory  Blood pressure (!) 127/96, pulse 86, temperature 97.8 F (36.6 C), temperature source Oral, resp. rate 18, height 6' (1.829 m), weight 185 lb 9.6 oz (84.2 kg), SpO2 96 %.  LABORATORY DATA: Lab Results  Component Value Date   WBC 16.9 (H) 05/13/2017   HGB 13.6 05/13/2017   HCT 41.4 05/13/2017   MCV 98.6 05/13/2017   PLT 285 05/13/2017      Chemistry      Component Value Date/Time   NA 138 05/13/2017 0752   NA 137 01/12/2017 0846   K 4.6 05/13/2017 0752   K 4.4 01/12/2017 0846   CL 106 05/13/2017 0752   CO2 23 05/13/2017 0752   CO2 18 (L) 01/12/2017 4174  BUN 12 05/13/2017 0752   BUN 15.3 01/12/2017 0846   CREATININE 1.10 05/13/2017 0752   CREATININE 1.19 05/04/2017 1134   CREATININE 1.0 01/12/2017 0846      Component Value Date/Time   CALCIUM 8.4 (L) 05/13/2017 0752   CALCIUM 9.0 01/12/2017 0846   ALKPHOS 83 05/04/2017 1134   ALKPHOS 99 01/12/2017 0846   AST 25 05/04/2017 1134   AST 23 01/12/2017 0846   ALT 30 05/04/2017 1134   ALT 22 01/12/2017 0846   BILITOT 0.4 05/04/2017 1134   BILITOT 0.41 01/12/2017 0846       RADIOGRAPHIC STUDIES: Ct Chest W  Contrast  Result Date: 05/05/2017 CLINICAL DATA:  Patient with history of stage IV non-small cell lung cancer. EXAM: CT CHEST, ABDOMEN, AND PELVIS WITH CONTRAST TECHNIQUE: Multidetector CT imaging of the chest, abdomen and pelvis was performed following the standard protocol during bolus administration of intravenous contrast. CONTRAST:  144m OMNIPAQUE IOHEXOL 300 MG/ML  SOLN COMPARISON:  CT CAP 02/10/2017 FINDINGS: CT CHEST FINDINGS Cardiovascular: Normal heart size. Trace fluid superior pericardial recess. Aorta and main pulmonary artery normal in caliber. Mediastinum/Nodes: No enlarged axillary, mediastinal or hilar lymphadenopathy. Normal appearance of the esophagus. Lungs/Pleura: Central airways are patent. Stable 5 mm subpleural left lower lobe nodule (image 144; series 4). Stable postsurgical changes left hemithorax. Stable 1.4 cm nodule medial left lower lobe (image 100; series 4). Centrilobular and paraseptal emphysematous changes. No pleural effusion or pneumothorax. Musculoskeletal: No aggressive or acute appearing osseous lesions. CT ABDOMEN PELVIS FINDINGS Hepatobiliary: Liver is normal in size and contour. Gallbladder is unremarkable. No intrahepatic or extrahepatic biliary ductal dilatation. Pancreas: Unremarkable Spleen: Unremarkable Adrenals/Urinary Tract: Adrenal glands are normal. Kidneys enhance symmetrically with contrast. Stable subcentimeter low-attenuation lesion superior pole right kidney. Urinary bladder is unremarkable. Stomach/Bowel: Sigmoid colonic diverticulosis. No CT evidence for acute diverticulitis. Normal morphology of the stomach. No evidence for small bowel obstruction. No free fluid or free intraperitoneal air. Vascular/Lymphatic: Normal caliber abdominal aorta. Peripheral calcified atherosclerotic plaque. No retroperitoneal lymphadenopathy. Reproductive: Prostate unremarkable. Other: Small fat containing left inguinal hernia. Musculoskeletal: Lumbar spine degenerative  changes. No aggressive or acute appearing osseous lesions. IMPRESSION: 1. Stable appearance of the chest. No evidence for locally recurrent or metastatic disease in the chest, abdomen or pelvis. 2. Aortic Atherosclerosis (ICD10-I70.0) and Emphysema (ICD10-J43.9). Electronically Signed   By: DLovey NewcomerM.D.   On: 05/05/2017 09:41   Mr BJeri CosWCBContrast  Result Date: 05/13/2017 CLINICAL DATA:  Tumor resection 05/12/2017. Personal history of lung cancer. Previously treated left frontal lobe metastasis with progressive area of enhancement. EXAM: MRI HEAD WITHOUT AND WITH CONTRAST TECHNIQUE: Multiplanar, multiecho pulse sequences of the brain and surrounding structures were obtained without and with intravenous contrast. CONTRAST:  112mMULTIHANCE GADOBENATE DIMEGLUMINE 529 MG/ML IV SOLN COMPARISON:  Multiple prior brain MRI is including 04/18/2017 and 03/31/2017. FINDINGS: Brain: Left craniotomy is again noted. Posterior left frontal re-excision is evident. There is a fluid level within the resection cavity with mixed blood products as expected. There is a thin rim of residual enhancement along the medial and inferior aspect of the surgical cavity. Surrounding vasogenic edema is unchanged. No new areas of enhancement are present. The diffusion-weighted images demonstrate no acute infarct. There is no midline shift. Ventricles are normal size. The brainstem and cerebellum are normal. Vascular: Flow is present in the major intracranial arteries. Skull and upper cervical spine: The skull base is within normal limits. Scalp edema is noted following repeat craniotomy, within normal  limits following recent surgery. Barium is otherwise unremarkable. Skull base is within normal limits. Sinuses/Orbits: A polyp or mucous retention cyst is again noted in the inferior right maxillary sinus. The remaining paranasal sinuses are clear. There is some fluid in the mastoid air cells bilaterally. No obstructing nasopharyngeal lesion  is present. Rightward nasal septal deviation is present. Globes and orbits are within normal limits. IMPRESSION: 1. Interval resection progressive peripherally enhancing mass lesion. 2. Minimal enhancement along the medial and inferior rim of the resection cavity. This may represent residual unresected enhancing tissue versus reactive changes. This will serve as a baseline for follow-up exam. 3. No significant change in the extent of surrounding vasogenic edema or evidence for acute infarct following surgery. 4. Blood products within the surgical cavity as expected. Electronically Signed   By: San Morelle M.D.   On: 05/13/2017 07:40   Ct Abdomen Pelvis W Contrast  Result Date: 05/05/2017 CLINICAL DATA:  Patient with history of stage IV non-small cell lung cancer. EXAM: CT CHEST, ABDOMEN, AND PELVIS WITH CONTRAST TECHNIQUE: Multidetector CT imaging of the chest, abdomen and pelvis was performed following the standard protocol during bolus administration of intravenous contrast. CONTRAST:  167m OMNIPAQUE IOHEXOL 300 MG/ML  SOLN COMPARISON:  CT CAP 02/10/2017 FINDINGS: CT CHEST FINDINGS Cardiovascular: Normal heart size. Trace fluid superior pericardial recess. Aorta and main pulmonary artery normal in caliber. Mediastinum/Nodes: No enlarged axillary, mediastinal or hilar lymphadenopathy. Normal appearance of the esophagus. Lungs/Pleura: Central airways are patent. Stable 5 mm subpleural left lower lobe nodule (image 144; series 4). Stable postsurgical changes left hemithorax. Stable 1.4 cm nodule medial left lower lobe (image 100; series 4). Centrilobular and paraseptal emphysematous changes. No pleural effusion or pneumothorax. Musculoskeletal: No aggressive or acute appearing osseous lesions. CT ABDOMEN PELVIS FINDINGS Hepatobiliary: Liver is normal in size and contour. Gallbladder is unremarkable. No intrahepatic or extrahepatic biliary ductal dilatation. Pancreas: Unremarkable Spleen: Unremarkable  Adrenals/Urinary Tract: Adrenal glands are normal. Kidneys enhance symmetrically with contrast. Stable subcentimeter low-attenuation lesion superior pole right kidney. Urinary bladder is unremarkable. Stomach/Bowel: Sigmoid colonic diverticulosis. No CT evidence for acute diverticulitis. Normal morphology of the stomach. No evidence for small bowel obstruction. No free fluid or free intraperitoneal air. Vascular/Lymphatic: Normal caliber abdominal aorta. Peripheral calcified atherosclerotic plaque. No retroperitoneal lymphadenopathy. Reproductive: Prostate unremarkable. Other: Small fat containing left inguinal hernia. Musculoskeletal: Lumbar spine degenerative changes. No aggressive or acute appearing osseous lesions. IMPRESSION: 1. Stable appearance of the chest. No evidence for locally recurrent or metastatic disease in the chest, abdomen or pelvis. 2. Aortic Atherosclerosis (ICD10-I70.0) and Emphysema (ICD10-J43.9). Electronically Signed   By: DLovey NewcomerM.D.   On: 05/05/2017 09:41    ASSESSMENT AND PLAN:  This is a very pleasant 57years old white male with metastatic non-small cell lung cancer, adenocarcinoma with positive PDL 1 expression of 100%. He completed treatment with Ketruda (pembrolizumab) 200 mg IV every 3 weeks, status post 35 cycles, a total of 2 years. The patient is currently on observation.  He had repeat CT scan of the chest, abdomen and pelvis performed few weeks ago.  I personally and independently reviewed the scans and discussed the results with the patient today.  His a scan showed no concerning findings for disease progression. I recommended for the patient to continue in observation with repeat CT scan of the chest, abdomen and pelvis in 3 months. He will continue with a taper dose of Decadron for now. He was advised to call immediately if he has any  concerning symptoms in the interval. The patient voices understanding of current disease status and treatment options and is in  agreement with the current care plan. All questions were answered. The patient knows to call the clinic with any problems, questions or concerns. We can certainly see the patient much sooner if necessary.   Disclaimer: This note was dictated with voice recognition software. Similar sounding words can inadvertently be transcribed and may not be corrected upon review.

## 2017-05-29 NOTE — Telephone Encounter (Signed)
Appointments scheduled avs/calendar contrast material provided per 5/20 los

## 2017-06-06 ENCOUNTER — Telehealth: Payer: Self-pay | Admitting: *Deleted

## 2017-06-06 NOTE — Telephone Encounter (Signed)
Received call from patient questioning if he could stop the Keppra once the prescription runs out since he was not taking prior to second resection and was only prescribed that as a preventative post surgery.  Per [place] ok to stop once prescription runs out.

## 2017-06-20 ENCOUNTER — Telehealth: Payer: Self-pay | Admitting: Internal Medicine

## 2017-06-20 NOTE — Telephone Encounter (Signed)
Patient called to reschedule  °

## 2017-07-04 ENCOUNTER — Ambulatory Visit: Payer: Medicare Other | Attending: Internal Medicine | Admitting: Physical Therapy

## 2017-07-04 ENCOUNTER — Encounter: Payer: Self-pay | Admitting: Physical Therapy

## 2017-07-04 ENCOUNTER — Other Ambulatory Visit: Payer: Self-pay

## 2017-07-04 DIAGNOSIS — R2681 Unsteadiness on feet: Secondary | ICD-10-CM

## 2017-07-04 DIAGNOSIS — R2689 Other abnormalities of gait and mobility: Secondary | ICD-10-CM

## 2017-07-04 DIAGNOSIS — R29818 Other symptoms and signs involving the nervous system: Secondary | ICD-10-CM | POA: Diagnosis not present

## 2017-07-04 DIAGNOSIS — M6281 Muscle weakness (generalized): Secondary | ICD-10-CM

## 2017-07-04 NOTE — Therapy (Signed)
[facility] [address] [place], [place], [address] Phone: [phone]   Fax:  [phone]  Physical Therapy Evaluation  Patient Details  Name: Timothy Obrien MRN: [id number] Date of Birth: 1960/06/20 Referring Provider: Ventura Sellers, MD   Encounter Date: 07/04/2017  PT End of Session - 07/04/17 1232    Visit Number  1    Number of Visits  17    Date for PT Re-Evaluation  09/02/17    Authorization Type  Medicare A/B     Authorization Time Period  07/04/17 to 10/02/17    PT Start Time  1102    PT Stop Time  1151    PT Time Calculation (min)  49 min    Activity Tolerance  Patient tolerated treatment well    Behavior During Therapy  [facility] for tasks assessed/performed       Past Medical History:  Diagnosis Date  . Anxiety   . Bronchitis   . Chronic fatigue 09/17/2015  . Depression 02/11/2016  . Hypertension 12/31/2015   states never been told  . 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 Pneumonia   . Seizures ([address])    last 12/11/14  . Shortness of breath dyspnea    with exertion  . Tobacco abuse     Past Surgical History:  Procedure Laterality Date  . APPLICATION OF CRANIAL NAVIGATION N/A 12/26/2014   Procedure: APPLICATION OF CRANIAL NAVIGATION;  Surgeon: Kevan Ny Ditty, MD;  Location: [place] [facility];  Service: Neurosurgery;  Laterality: N/A;  . APPLICATION OF CRANIAL NAVIGATION Left 05/12/2017   Procedure: APPLICATION OF CRANIAL NAVIGATION;  Surgeon: Consuella Lose, MD;  Location: [facility];  Service: Neurosurgery;  Laterality: Left;  APPLICATION OF CRANIAL NAVIGATION  . CRANIOTOMY N/A 12/26/2014   Procedure: CRANIOTOMY TUMOR EXCISION with BrainLab;  Surgeon: Kevan Ny Ditty, MD;  Location: [place] [facility];  Service: Neurosurgery;  Laterality: N/A;  CRANIOTOMY TUMOR EXCISION with [facility]  . CRANIOTOMY Left 05/12/2017   Procedure: STEREOTACTIC LEFT FRONTOPERIETIAL CRANIOTOMY FOR  RESECTION OF TUMOR;  Surgeon: Consuella Lose, MD;  Location: [facility];  Service: Neurosurgery;  Laterality: Left;  STEREOTACTIC LEFT FRONTOPERIETIAL CRANIOTOMY FOR RESECTION OF TUMOR  . HERNIA REPAIR    . VASECTOMY    . VIDEO ASSISTED THORACOSCOPY (VATS)/WEDGE RESECTION Left 01/14/2015   Procedure: VIDEO ASSISTED THORACOSCOPY (VATS)/WEDGE RESECTION, Superior segmentectomy left  lower lobe, wedge resection of left upper lobe, multiple lymph node disection, On Q insertion.;  Surgeon: Grace Isaac, MD;  Location: [facility];  Service: Thoracic;  Laterality: Left;  Marland Kitchen VIDEO BRONCHOSCOPY Bilateral 12/16/2014   Procedure: VIDEO BRONCHOSCOPY WITH FLUORO;  Surgeon: Collene Gobble, MD;  Location: [facility];  Service: Cardiopulmonary;  Laterality: Bilateral;  . VIDEO BRONCHOSCOPY N/A 01/14/2015   Procedure: VIDEO BRONCHOSCOPY;  Surgeon: Grace Isaac, MD;  Location: [facility];  Service: Thoracic;  Laterality: N/A;    There were no vitals filed for this visit.   Subjective Assessment - 07/04/17 1105    Subjective  When I got scared (re: loss of strength) I personally began doing the exercises you gave me before. Doing exercises for 2 weeks now and has seen improvement in ankle DF strength. (Admits he never fully committed to doing the exercises during previous episode, but very motivated to exercise now). Never got the AFO (his father was sick, in [place], he went to [place] to help him, father passed away--thru all that  he never went back to get brace). Has fallen numerous times-going up the one step onto porch carrying groceries and toe caught, second and third falls had been sitting a long time and got up and rt leg gave out. Asking numerous questions re: prior HEP and what he can expect from therapy.     Pertinent History  December 2016:  initial c/o seizure, characterized as "right sided shaking"; this led to an MRI which demonstrated an enhancing mass consistent with metastatic adenocarcinoma. (treated with resection  and chemo) MRI in July 2018 demonstrated possible progression in brain; 05/13/15 lt frontal craniotomy for resection tumor and found radiation necrosis (not return of tumor/cancer)    Limitations  Walking    How long can you walk comfortably?  grocery store is often too much walking    Patient Stated Goals  Pt's goals for therapy to get RLE stronger and get rid of the weakness; avoid falling    Currently in Pain?  No/denies         [facility] PT Assessment - 07/04/17 0001      Assessment   Medical Diagnosis  s/p left frontal craniotomy due to tumor necrosis    Referring Provider  Ventura Sellers, MD    Onset Date/Surgical Date  05/12/17 most recent surgery with incr deficits    Prior Therapy  OPPT Jan-Feb 2019      Precautions   Precautions  Fall      Restrictions   Weight Bearing Restrictions  No      Balance Screen   Has the patient fallen in the past 6 months  Yes    How many times?  3    Has the patient had a decrease in activity level because of a fear of falling?   Yes    Is the patient reluctant to leave their home because of a fear of falling?   Yes      [place] residence    Living Arrangements  Alone    Available Help at Discharge  Other (Comment) "ex' assists with groceries sometimes    Type of [place] to enter    Entrance Stairs-Number of Steps  2    Entrance Stairs-Rails  None    Home Layout  Two level    Alternate Level Stairs-Number of Steps  13    Alternate Level Stairs-Rails  Left    Home Equipment  Walker - 2 wheels;Cane - single point;Shower seat shower stall and so chair is sitting outside shower      Prior Function   Level of Independence  Independent    Vocation  Retired      Associate Professor   Overall Cognitive Status  No family/caregiver present to determine baseline cognitive functioning    Awareness  Impaired    Awareness Impairment  Intellectual impairment aware of diagnosis; poor safety  awareness; cont's to drive      Observation/Other Assessments   Observations  lobby to exam room with significant RLE weakness (dragging RLE)    Focus on Therapeutic Outcomes (FOTO)   NA      Sensation   Light Touch  Appears Intact pt denies changes      Tone   Assessment Location  Right Lower Extremity      AROM   Overall AROM   Deficits    Overall AROM Comments  limited RLE AROM due to weakness  PROM   Overall PROM   Within functional limits for tasks performed RLE      Strength   Right Hip Flexion  3+/5    Right Hip Extension  1/5 prone    Right Hip ABduction  2+/5 sidelying    Left Hip Flexion  5/5    Left Hip Extension  3+/5    Right Knee Flexion  2-/5    Right Knee Extension  3+/5    Left Knee Flexion  5/5    Left Knee Extension  5/5    Right Ankle Dorsiflexion  2+/5    Right Ankle Eversion  0/5    Left Ankle Dorsiflexion  4/5      Bed Mobility   Bed Mobility  Rolling Right;Rolling Left;Supine to Sit;Sit to Supine    Rolling Right  Independent incr time and effort    Rolling Left  Independent incr time and effort    Supine to Sit  Independent incr time and effort    Sit to Supine  Independent incr time and effort      Transfers   Transfers  Sit to Stand;Stand to Sit    Sit to Stand  6: Modified independent (Device/Increase time);Without upper extremity assist;From bed incr time and effort    Transfer Cueing  begin trying to put more weight on RLE for further strengthening      Ambulation/Gait   Ambulation/Gait Assistance  6: Modified independent (Device/Increase time)    Ambulation/Gait Assistance Details  gait very effortful with RLE dragging behind and using step-to pattern; RUE draws up into flexion with effort    Ambulation Distance (Feet)  40 Feet 15    Assistive device  None    Gait Pattern  Step-to pattern;Decreased arm swing - right;Decreased step length - right;Decreased stance time - right;Decreased step length - left;Decreased dorsiflexion -  right;Decreased weight shift to right;Right foot flat;Right flexed knee in stance;Lateral trunk lean to left;Poor foot clearance - right    Ambulation Surface  Level;Indoor    Gait Comments  Discussed need to re-visit use of a brace on RLE and pt is open to doing this      RLE Tone   RLE Tone  Mild Clonus noted RLE with passive quick stretch R dflex                Objective measurements completed on examination: See above findings.              PT Education - 07/04/17 1228    Education Details  results of strength assessment; role of neuroplasticity and need for practice--this will maximize his strength but will never return to "normal" ; role of OT and possible to request referral from MD to address RUE weakness; reviewed and updated his HEP from previous episode of PT (pt had brought with multple questions since he has been doing ex's x 2 weeks).     Person(s) Educated  Patient    Methods  Explanation;Demonstration;Tactile cues;Verbal cues;Handout    Comprehension  Verbalized understanding;Returned demonstration;Verbal cues required;Tactile cues required;Need further instruction       PT Short Term Goals - 07/04/17 1303      PT SHORT TERM GOAL #1   Title  Patient will be independent with basic HEP for strengthening and balance. TARGET for all STGs 08/03/2017    Time  4    Period  Weeks    Status  New    Target Date  08/03/17  PT SHORT TERM GOAL #2   Title  Patient will improve gait velocity to >1.81 ft/sec with appropriate AD and/or brace to demonstrate lesser fall risk with gait.     Time  4    Period  Weeks    Status  New      PT SHORT TERM GOAL #3   Title  Patient will state fall prevention measures he has instituted in his home after fall prevention education.     Time  4    Period  Weeks    Status  New      PT SHORT TERM GOAL #4   Title  Patient will demonstrate modified independent floor to stand transfer with support of furniture.     Time  4     Period  Weeks    Status  New        PT Long Term Goals - 07/04/17 1301      PT LONG TERM GOAL #1   Title  Pt will be independent with updated HEP for improved strength, balance, and gait and able to verbalize his plan for continued community-based exercise program upon completing PT. TARGET for all LTGs 09/02/2017    Time  8    Period  Weeks    Status  New    Target Date  09/02/17      PT LONG TERM GOAL #2   Title  Patient will ambulate modified indepent 500 ft on level, indoor surface with appropriate AD +/- RLE brace.    Time  8    Period  Weeks    Status  New      PT LONG TERM GOAL #3   Title  Patient will improved gait velocity to >=2.5 ft/sec with appropriate AD to demonstrate improved safety when walking in the community    Baseline  --    Time  8    Period  Weeks    Status  New      PT LONG TERM GOAL #4   Title  --    Time  --    Period  --    Status  --      PT LONG TERM GOAL #5   Title  --    Time  --    Period  --    Status  --             Plan - 07/04/17 1234    Clinical Impression Statement  57 yo referred for OPPT due to RLE weakness s/p left frontal craniotomy due to metastatic lung cancer. 05/13/15 was his second craniotomy and patient now has increased weakness RLE effecting his safety with gait. Patient can benefit from PT to address these and the deficits listed below via the interventions listed below.     History and Personal Factors relevant to plan of care:  PMH-05/13/15 lt frontal craniotomy for resection tumor (biopsy showed radiation necrosis, not an extension of the tumor); anxiety, HTN, lung cancer, seizures  Personal factors-lack of support (lives alone); mechanism/etiology of weakness    Clinical Presentation  Evolving    Clinical Presentation due to:  progressive weakness since last episode of PT ending in Feb 2019    Clinical Decision Making  Moderate    Rehab Potential  Good    Clinical Impairments Affecting Rehab Potential  Hx of  cancer/mets required second craniotomy with incr weakness    PT Frequency  2x / week    PT Duration  8  weeks    PT Treatment/Interventions  ADLs/Self Care Home Management;Aquatic Therapy;Gait training;DME Instruction;Stair training;Functional mobility training;Therapeutic activities;Therapeutic exercise;Balance training;Neuromuscular re-education;Cognitive remediation;Manual techniques;Orthotic Fit/Training;Patient/family education;Passive range of motion;Energy conservation    PT Next Visit Plan  address safety with gait and check gait velocity for goals (he owns cane and RW but has never used either) and ?begin to try AFOs as he is open to this per discussion on eval (check prior episode notes for what worked best);        Patient will benefit from skilled therapeutic intervention in order to improve the following deficits and impairments:  Abnormal gait, Decreased activity tolerance, Decreased balance, Decreased cognition, Decreased endurance, Decreased knowledge of use of DME, Decreased mobility, Decreased strength, Decreased safety awareness, Decreased range of motion, Impaired perceived functional ability, Impaired tone, Impaired UE functional use  Visit Diagnosis: Muscle weakness (generalized) - Plan: PT plan of care cert/re-cert  Other abnormalities of gait and mobility - Plan: PT plan of care cert/re-cert  Unsteadiness on feet - Plan: PT plan of care cert/re-cert  Other symptoms and signs involving the nervous system - Plan: PT plan of care cert/re-cert     Problem List Patient Active Problem List   Diagnosis Date Noted  . Brain tumor ([place]) 05/12/2017  . Right leg weakness 01/12/2017  . Depression 02/11/2016  . Hypertension 12/31/2015  . Chronic fatigue 09/17/2015  . Shoulder pain, left 08/06/2015  . Encounter for antineoplastic immunotherapy 03/12/2015  . Malnutrition of moderate degree 01/17/2015  . S/P craniotomy 12/26/2014  . Brain metastasis ([place]) 12/26/2014  .  Primary cancer of left lower lobe of lung ([place]) 12/22/2014  . Right sided weakness 12/12/2014  . Tobacco abuse     Rexanne Mano, PT 07/04/2017, 8:33 PM  [facility] [address] [place], [place], [address] Phone: [phone]   Fax:  [phone]  Name: Timothy Obrien MRN: [id number] Date of Birth: August 01, 1960

## 2017-07-04 NOTE — Patient Instructions (Signed)
Patient brought all his exercise sheets from previous episode of PT in Feb 2019. Reviewed exercises with pt as he has resumed doing them and wanted to know if they were safe for him to do. See updates that were hand-written on his copy.    ANKLE: Pumps    Point toes down, then up. __10_ reps per set, __2-3_ sets per day   Copyright  VHI. All rights reserved.  HIP / KNEE: Extension - Sit to Stand    Sitting, lean chest forward, raise hips up from surface. Straighten hips and knees. Weight bear equally on left and right sides. Backs of legs should not push off surface. _10__ reps per set, __1-2_ sets per day   *Try to put more weight on your right leg to increase how much work it is doing   ANKLE: Plantarflexion, Unilateral - Standing    Stand on leg with knee straight. Raise heel up as high as possible. __10_ reps per set, __1_ sets per day, _5__ days per week.  Hold onto a support lightly with fingertips. Repeat with knee slightly bent.  *Do with both feet on the floor with left foot "helping" right leg    Resisted - Four Way Hip     With theraband around right ankle, balance on left leg and complete leg kicks in the following directions:  1. Facing toward theraband, extend right leg behind you with knee straight. HOld for 3 sec. Avoid bending forward at your hips. Repeat 10 times. 2. Turn to right, slowly kick leg out to the side while keeping your toes facing forward. Hold for 3 sec. Avoid leaning to the side. Repeat 10 times.  Repeat on right and left legs.  **Do not use band around legs. Marked out adduction and changed hip flexion to bent leg/march.   Bracing With March in Bridging (Hook-Lying)    With neutral spine, tighten abdominals and hold. Lift bottom and hold, then march in place. March _10-20__ times. Do _1__ times a day.  **Only lift left leg while bridging (stabilizing with RLE)  Copyright  VHI. All rights reserved.   Bridging: with  Straight Leg Raise    With legs bent, lift buttocks _6-8___ inches from floor. Then slowly extend right knee, keeping stomach tight. Hold for 3-5 seconds. Put right foot down and extend left leg. Hold 3-5 seconds. Repeat __20__ times per set. Do __1__ sets per session. Do __1__ sessions per day.  ** Patient is doing this as a single leg bridge and OK to continue     WALKING  Walking is a great form of exercise to increase your strength, endurance and overall fitness.  A walking program can help you start slowly and gradually build endurance as you go.  Everyone's ability is different, so each person's starting point will be different.  You do not have to follow them exactly.  The are just samples. You should simply find out what's right for you and stick to that program.   In the beginning, you'll start off walking 2-3 times a day for short distances.  As you get stronger, you'll be walking further at just 1-2 times per day.  A.         You Can Walk For A Certain Length Of Time Each Day                          Walk 10 minutes 2 times per day.  Increase 1-2 minutes every 3-4 days              Work up to 15-54minutes (1-2 times per day). Then can begin one 30 minutes walk.                B.         You Can Walk For a Certain Distance Each Day                                      Distance can be substituted for time.                          Example:                         3 trips to mailbox (at road)                         3 trips to corner of block                         3 trips around the block  C.         Go to local [employer] and use the track.                          Walk for distance ____ around track             Or time ____ minutes  Please only do the exercises that your therapist has initialed and dated **Discussed resuming walking program if safe to do so. Maybe in a large store with a cart and AC  Toe / Heel Raise    Gently rock back  on heels  and raise toes for 3 seconds. Then rock forward on toes and raise heels for 3 seconds. Repeat sequence __2 sets of 10__ times per session. Do _3-5___ sessions per week.  **D/c'd toe raises as he cannot lift toes in this position   HIP / KNEE: Flexion / Extension, Wall Squat    Stance: shoulder-width on floor, against wall. Place feet in front of hips. Bend hips and knees. Keep back straight and keep back on wall. Do not allow knees to bend past toes. Squeeze glutes and quads to stand. _2 sets of 10__ reps per set, _3-5__ days per week  **pt reports he is doing this exercise "okay"   Forward Weight Shift    Stand with upright posture. Left leg in front of right. Shift weight forward onto left leg, straighten hip and knee "standing tall." Shift weight back onto right leg.  __10_ reps per set, _3__ sets per day, __5_ days per week. Hold onto a support (chair or countertop). Repeat with other leg.   As you feel comfortable, can progress to adding a second step and stand tall and then back.   *Can resume this exercise  FUNCTIONAL MOBILITY: Squat    Stance: Stand at the counter, with feet shoulder-width on floor. Bend hips and knees, holding 3 seconds. Keep back straight. Do not allow knees to bend past toes. Squeeze glutes and quads to stand. _10__ reps per set, _2__ sets per day

## 2017-07-11 ENCOUNTER — Encounter: Payer: Self-pay | Admitting: Physical Therapy

## 2017-07-11 ENCOUNTER — Ambulatory Visit: Payer: Medicare Other | Attending: Internal Medicine | Admitting: Physical Therapy

## 2017-07-11 DIAGNOSIS — R2689 Other abnormalities of gait and mobility: Secondary | ICD-10-CM | POA: Insufficient documentation

## 2017-07-11 DIAGNOSIS — M6281 Muscle weakness (generalized): Secondary | ICD-10-CM

## 2017-07-11 DIAGNOSIS — R29818 Other symptoms and signs involving the nervous system: Secondary | ICD-10-CM | POA: Diagnosis not present

## 2017-07-11 NOTE — Patient Instructions (Signed)
From [employer]:  Active assisted R ankle dorsiflexion (using black theraband) x 10 reps  Seated heelslides, with LLE assist if needed, then hold R heel down for gastroc stretch, 10 reps, 10-15 second hold  Sit<>stand, x 10 reps, cues for upright standing posture  Standing marching in place, alternating legs x 2 sets of 5 reps  minisquats at counter, x 10 reps, with cues for upright standing posture

## 2017-07-11 NOTE — Therapy (Signed)
[facility] [address] [place], [place], [address] Phone: [phone]   Fax:  [phone]  Physical Therapy Treatment  Patient Details  Name: Timothy Obrien MRN: [id number] Date of Birth: 1960/03/27 Referring Provider: Ventura Sellers, MD   Encounter Date: 07/11/2017  PT End of Session - 07/11/17 2320    Visit Number  2    Number of Visits  17    Date for PT Re-Evaluation  09/02/17    Authorization Type  Medicare A/B     Authorization Time Period  07/04/17 to 10/02/17    PT Start Time  1403    PT Stop Time  1447    PT Time Calculation (min)  44 min    Activity Tolerance  Patient tolerated treatment well    Behavior During Therapy  [facility] for tasks assessed/performed       Past Medical History:  Diagnosis Date  . Anxiety   . Bronchitis   . Chronic fatigue 09/17/2015  . Depression 02/11/2016  . Hypertension 12/31/2015   states never been told  . 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 Pneumonia   . Seizures ([place])    last 12/11/14  . Shortness of breath dyspnea    with exertion  . Tobacco abuse     Past Surgical History:  Procedure Laterality Date  . APPLICATION OF CRANIAL NAVIGATION N/A 12/26/2014   Procedure: APPLICATION OF CRANIAL NAVIGATION;  Surgeon: Kevan Ny Ditty, MD;  Location: [place] [facility];  Service: Neurosurgery;  Laterality: N/A;  . APPLICATION OF CRANIAL NAVIGATION Left 05/12/2017   Procedure: APPLICATION OF CRANIAL NAVIGATION;  Surgeon: Consuella Lose, MD;  Location: [place];  Service: Neurosurgery;  Laterality: Left;  APPLICATION OF CRANIAL NAVIGATION  . CRANIOTOMY N/A 12/26/2014   Procedure: CRANIOTOMY TUMOR EXCISION with BrainLab;  Surgeon: Kevan Ny Ditty, MD;  Location: [place] [facility];  Service: Neurosurgery;  Laterality: N/A;  CRANIOTOMY TUMOR EXCISION with Stealth  . CRANIOTOMY Left 05/12/2017   Procedure: STEREOTACTIC LEFT FRONTOPERIETIAL CRANIOTOMY FOR  RESECTION OF TUMOR;  Surgeon: Consuella Lose, MD;  Location: [place];  Service: Neurosurgery;  Laterality: Left;  STEREOTACTIC LEFT FRONTOPERIETIAL CRANIOTOMY FOR RESECTION OF TUMOR  . HERNIA REPAIR    . VASECTOMY    . VIDEO ASSISTED THORACOSCOPY (VATS)/WEDGE RESECTION Left 01/14/2015   Procedure: VIDEO ASSISTED THORACOSCOPY (VATS)/WEDGE RESECTION, Superior segmentectomy left  lower lobe, wedge resection of left upper lobe, multiple lymph node disection, On Q insertion.;  Surgeon: Grace Isaac, MD;  Location: [place];  Service: Thoracic;  Laterality: Left;  Marland Kitchen VIDEO BRONCHOSCOPY Bilateral 12/16/2014   Procedure: VIDEO BRONCHOSCOPY WITH FLUORO;  Surgeon: Collene Gobble, MD;  Location: [place];  Service: Cardiopulmonary;  Laterality: Bilateral;  . VIDEO BRONCHOSCOPY N/A 01/14/2015   Procedure: VIDEO BRONCHOSCOPY;  Surgeon: Grace Isaac, MD;  Location: [facility] OR;  Service: Thoracic;  Laterality: N/A;    There were no vitals filed for this visit.  Subjective Assessment - 07/11/17 2314    Subjective  Been trying very hard to do some of the exercises from previous HEP.  I feel like my leg is getting a littel stronger.  It catches on the floor alot; have have several falls, but none "catastrophic".    Pertinent History  December 2016:  initial c/o seizure, characterized as "right sided shaking"; this led to an MRI which demonstrated an enhancing mass consistent with metastatic adenocarcinoma. (treated with resection and  chemo) MRI in July 2018 demonstrated possible progression in brain; 05/13/15 lt frontal craniotomy for resection tumor and found radiation necrosis (not return of tumor/cancer)    Limitations  Walking    How long can you walk comfortably?  grocery store is often too much walking    Patient Stated Goals  Pt's goals for therapy to get RLE stronger and get rid of the weakness; avoid falling    Currently in Pain?  No/denies                       [facility] Adult PT  Treatment/Exercise - 07/11/17 0001      Ambulation/Gait   Ambulation/Gait  Yes    Ambulation/Gait Assistance  5: Supervision    Ambulation/Gait Assistance Details  R foot drags multiple times with gait    Ambulation Distance (Feet)  60 Feet 40 ft, then 80 ft    Assistive device  None    Gait Pattern  Step-to pattern;Decreased arm swing - right;Decreased step length - right;Decreased stance time - right;Decreased step length - left;Decreased dorsiflexion - right;Decreased weight shift to right;Right foot flat;Right flexed knee in stance;Lateral trunk lean to left;Poor foot clearance - right;Right circumduction    Ambulation Surface  Level;Indoor       Therapeutic Exercise: REviewed pt's previous HEP (from therapy in January of 2019), including the following: -seated ankle pumps (decreased ability for R ankle dorsiflexion) -sit<>stand -standing heel raises (unable to perform R toe raises due to weakness) -standing marching in place (pt performs 5-8 reps on RLE, then 5 reps on LLE) -Minisquats x 10 reps  -Reports he can no longer do standing hamstring curls   Requested due to increased weakness in RLE, he d/c previous HEP.  Instead, HEP modified, and some new exercises performed and added to HEP today: -Seated heelslides RLE x 8 reps, with LLE to assist into position, keeping R heel on ground pressing through knee, 10-15 second hold, then slow release -Seated active/assisted R ankle dorsiflexion using black theraband to assist, x 10 reps -Sit<>stand x 10 reps with equal weightbearing, upright posture upon standing -Minisquats at counter, cues on technique, x 10 reps, with cues for gluts, quads sets upon standing -Standing marching in place, alternating legs, focus on quality of movement, to rest with fatigue and then start again, 2 sets x 5 reps  Pt has multiple questions and needs cues for proper technique of exercises and PT educated patient on why we are changing HEP exercises to better  target the weakness in RLE at this time in a safe, appropriate way.        PT Education - 07/11/17 2319    Education Details  HEP additions    Person(s) Educated  Patient    Methods  Explanation;Demonstration;Handout;Verbal cues written instructions with pictures to replace old HEP    Comprehension  Verbalized understanding;Returned demonstration;Verbal cues required;Need further instruction       PT Short Term Goals - 07/04/17 1303      PT SHORT TERM GOAL #1   Title  Patient will be independent with basic HEP for strengthening and balance. TARGET for all STGs 08/03/2017    Time  4    Period  Weeks    Status  New    Target Date  08/03/17      PT SHORT TERM GOAL #2   Title  Patient will improve gait velocity to >1.81 ft/sec with appropriate AD and/or brace to demonstrate lesser fall risk  with gait.     Time  4    Period  Weeks    Status  New      PT SHORT TERM GOAL #3   Title  Patient will state fall prevention measures he has instituted in his home after fall prevention education.     Time  4    Period  Weeks    Status  New      PT SHORT TERM GOAL #4   Title  Patient will demonstrate modified independent floor to stand transfer with support of furniture.     Time  4    Period  Weeks    Status  New        PT Long Term Goals - 07/04/17 1301      PT LONG TERM GOAL #1   Title  Pt will be independent with updated HEP for improved strength, balance, and gait and able to verbalize his plan for continued community-based exercise program upon completing PT. TARGET for all LTGs 09/02/2017    Time  8    Period  Weeks    Status  New    Target Date  09/02/17      PT LONG TERM GOAL #2   Title  Patient will ambulate modified indepent 500 ft on level, indoor surface with appropriate AD +/- RLE brace.    Time  8    Period  Weeks    Status  New      PT LONG TERM GOAL #3   Title  Patient will improved gait velocity to >=2.5 ft/sec with appropriate AD to demonstrate improved  safety when walking in the community    Baseline  --    Time  8    Period  Weeks    Status  New      PT LONG TERM GOAL #4   Title  --    Time  --    Period  --    Status  --      PT LONG TERM GOAL #5   Title  --    Time  --    Period  --    Status  --            Plan - 07/11/17 2321    Clinical Impression Statement  Focused session today on review of pt's HEP from previous bout of therapy and updated HEP for more appropriate lower extremity exercises.  Pt has multiple episodes of RLE catching floor despite attempts at steppage gait and RLE circumduction, and discussed gait safety, including option for use of assistive device and/or bracing.  Pt open to trying these options.    Rehab Potential  Good    Clinical Impairments Affecting Rehab Potential  Hx of cancer/mets required second craniotomy with incr weakness    PT Frequency  2x / week    PT Duration  8 weeks    PT Treatment/Interventions  ADLs/Self Care Home Management;Aquatic Therapy;Gait training;DME Instruction;Stair training;Functional mobility training;Therapeutic activities;Therapeutic exercise;Balance training;Neuromuscular re-education;Cognitive remediation;Manual techniques;Orthotic Fit/Training;Patient/family education;Passive range of motion;Energy conservation    PT Next Visit Plan  Check gait velocity again (assessed today, but with busy gym, did not write down number); try gait with RW and possibly AFO; review HEP and progress as appropriate for lower extremity strength/NMR    Consulted and Agree with Plan of Care  Patient       Patient will benefit from skilled therapeutic intervention in order to improve the following deficits and impairments:  Abnormal gait, Decreased activity tolerance, Decreased balance, Decreased cognition, Decreased endurance, Decreased knowledge of use of DME, Decreased mobility, Decreased strength, Decreased safety awareness, Decreased range of motion, Impaired perceived functional  ability, Impaired tone, Impaired UE functional use  Visit Diagnosis: Muscle weakness (generalized)  Other abnormalities of gait and mobility     Problem List Patient Active Problem List   Diagnosis Date Noted  . Brain tumor ([place]) 05/12/2017  . Right leg weakness 01/12/2017  . Depression 02/11/2016  . Hypertension 12/31/2015  . Chronic fatigue 09/17/2015  . Shoulder pain, left 08/06/2015  . Encounter for antineoplastic immunotherapy 03/12/2015  . Malnutrition of moderate degree 01/17/2015  . S/P craniotomy 12/26/2014  . Brain metastasis ([place]) 12/26/2014  . Primary cancer of left lower lobe of lung ([place]) 12/22/2014  . Right sided weakness 12/12/2014  . Tobacco abuse     Lachlyn Vanderstelt W. 07/11/2017, 11:25 PM Frazier Butt., PT  [facility] [address] [place], [place], [address] Phone: [phone]   Fax:  [phone]  Name: Timothy Obrien MRN: [id number] Date of Birth: 09-19-1960

## 2017-07-25 ENCOUNTER — Other Ambulatory Visit: Payer: Self-pay | Admitting: *Deleted

## 2017-07-26 ENCOUNTER — Ambulatory Visit: Payer: Medicare Other | Admitting: Physical Therapy

## 2017-07-26 ENCOUNTER — Encounter: Payer: Self-pay | Admitting: Physical Therapy

## 2017-07-26 ENCOUNTER — Other Ambulatory Visit: Payer: Self-pay | Admitting: Radiation Therapy

## 2017-07-26 DIAGNOSIS — M6281 Muscle weakness (generalized): Secondary | ICD-10-CM | POA: Diagnosis not present

## 2017-07-26 DIAGNOSIS — R2689 Other abnormalities of gait and mobility: Secondary | ICD-10-CM

## 2017-07-26 DIAGNOSIS — R29818 Other symptoms and signs involving the nervous system: Secondary | ICD-10-CM

## 2017-07-26 NOTE — Therapy (Signed)
[facility] [address] [place], [place], [address] Phone: [phone]   Fax:  [phone]  Physical Therapy Treatment  Patient Details  Name: Timothy Obrien MRN: [id number] Date of Birth: 01-22-1960 Referring Provider: Ventura Sellers, MD   Encounter Date: 07/26/2017  PT End of Session - 07/26/17 0944    Visit Number  3    Number of Visits  17    Date for PT Re-Evaluation  09/02/17    Authorization Type  Medicare A/B     Authorization Time Period  07/04/17 to 10/02/17    PT Start Time  0851    PT Stop Time  0931    PT Time Calculation (min)  40 min    Equipment Utilized During Treatment  Gait belt    Activity Tolerance  Patient tolerated treatment well    Behavior During Therapy  [facility] for tasks assessed/performed       Past Medical History:  Diagnosis Date  . Anxiety   . Bronchitis   . Chronic fatigue 09/17/2015  . Depression 02/11/2016  . Hypertension 12/31/2015   states never been told  . Lung cancer ([place])    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  . Pneumonia   . Seizures ([place])    last 12/11/14  . Shortness of breath dyspnea    with exertion  . Tobacco abuse     Past Surgical History:  Procedure Laterality Date  . APPLICATION OF CRANIAL NAVIGATION N/A 12/26/2014   Procedure: APPLICATION OF CRANIAL NAVIGATION;  Surgeon: Kevan Ny Ditty, MD;  Location: [place] NEUR[facility] ORS;  Service: Neurosurgery;  Laterality: N/A;  . APPLICATION OF CRANIAL NAVIGATION Left 05/12/2017   Procedure: APPLICATION OF CRANIAL NAVIGATION;  Surgeon: Consuella Lose, MD;  Location: [place];  Service: Neurosurgery;  Laterality: Left;  APPLICATION OF CRANIAL NAVIGATION  . CRANIOTOMY N/A 12/26/2014   Procedure: CRANIOTOMY TUMOR EXCISION with BrainLab;  Surgeon: Kevan Ny Ditty, MD;  Location: [place] NEURO ORS;  Service: Neurosurgery;  Laterality: N/A;  CRANIOTOMY TUMOR EXCISION with Stealth  . CRANIOTOMY Left 05/12/2017   Procedure:  STEREOTACTIC LEFT FRONTOPERIETIAL CRANIOTOMY FOR RESECTION OF TUMOR;  Surgeon: Consuella Lose, MD;  Location: [place];  Service: Neurosurgery;  Laterality: Left;  STEREOTACTIC LEFT FRONTOPERIETIAL CRANIOTOMY FOR RESECTION OF TUMOR  . HERNIA REPAIR    . VASECTOMY    . VIDEO ASSISTED THORACOSCOPY (VATS)/WEDGE RESECTION Left 01/14/2015   Procedure: VIDEO ASSISTED THORACOSCOPY (VATS)/WEDGE RESECTION, Superior segmentectomy left  lower lobe, wedge resection of left upper lobe, multiple lymph node disection, On Q insertion.;  Surgeon: Grace Isaac, MD;  Location: [place];  Service: Thoracic;  Laterality: Left;  Marland Kitchen VIDEO BRONCHOSCOPY Bilateral 12/16/2014   Procedure: VIDEO BRONCHOSCOPY WITH FLUORO;  Surgeon: Collene Gobble, MD;  Location: [place];  Service: Cardiopulmonary;  Laterality: Bilateral;  . VIDEO BRONCHOSCOPY N/A 01/14/2015   Procedure: VIDEO BRONCHOSCOPY;  Surgeon: Grace Isaac, MD;  Location: [facility] OR;  Service: Thoracic;  Laterality: N/A;    There were no vitals filed for this visit.  Subjective Assessment - 07/26/17 0853    Subjective  I feel like my leg is getting stronger. No falls since last visit. Questions re: seated heelslides.     Pertinent History  December 2016:  initial c/o seizure, characterized as "right sided shaking"; this led to an MRI which demonstrated an enhancing mass consistent with metastatic adenocarcinoma. (treated with resection and chemo) MRI in July 2018 demonstrated possible progression in brain;  05/13/15 lt frontal craniotomy for resection tumor and found radiation necrosis (not return of tumor/cancer)    Limitations  Walking    How long can you walk comfortably?  grocery store is often too much walking    Patient Stated Goals  Pt's goals for therapy to get RLE stronger and get rid of the weakness; avoid falling    Currently in Pain?  No/denies       Treatment- Pre-gait and gait training working on loading RLE with hip and knee extension for  strengthening and timing to reduce rt knee buckling. Also working on advancing RLE without circumduction and improving toe clearance to reduce fall risk and allow safe use of RW (initially was catching RLE on back leg of RW as he circumducted). Educated on safe use of SPC with quad tip, however pt has to really break down the sequencing into 3 separate steps (cane, RLE, LLE) to ensure cane is positioned in front of him and contacting the ground before he tries to advance RLE. He was improving with practice, however he saw the benefit of RW and ease of sequencing, focusing on gait pattern. Max distance 120 ft at a time with up to min assist for balance and/or facilitating weight-shifting, knee and hip extension.   Also worked with rt SpryStep Plus AFO (Thuasne brace with anterior support) with improved clearance but still required simulated toe cap for improved clearance. Patient felt like this worked the best of other prior braces we tried in February. (After his appt, contacted [employer] to see if they can work off the information from his prior referral (he met them one time and needed a face to face meeting with his physician, which did not happen due to father's illness and pt left for [place]). Informed he needs a new prescription and still needs face to face session with MD. Durene Cal in-box message to Dr. Mickeal Skinner re: situation).                         PT Education - 07/26/17 0943    Education Details  updates to HEP (in [place]); proper use of SPC vs RW and pros/cons of each    Person(s) Educated  Patient    Methods  Explanation;Demonstration;Verbal cues;Handout    Comprehension  Verbalized understanding;Returned demonstration;Verbal cues required;Need further instruction       PT Short Term Goals - 07/04/17 1303      PT SHORT TERM GOAL #1   Title  Patient will be independent with basic HEP for strengthening and balance. TARGET for all STGs 08/03/2017    Time  4     Period  Weeks    Status  New    Target Date  08/03/17      PT SHORT TERM GOAL #2   Title  Patient will improve gait velocity to >1.81 ft/sec with appropriate AD and/or brace to demonstrate lesser fall risk with gait.     Time  4    Period  Weeks    Status  New      PT SHORT TERM GOAL #3   Title  Patient will state fall prevention measures he has instituted in his home after fall prevention education.     Time  4    Period  Weeks    Status  New      PT SHORT TERM GOAL #4   Title  Patient will demonstrate modified independent floor to stand transfer with support of  furniture.     Time  4    Period  Weeks    Status  New        PT Long Term Goals - 07/04/17 1301      PT LONG TERM GOAL #1   Title  Pt will be independent with updated HEP for improved strength, balance, and gait and able to verbalize his plan for continued community-based exercise program upon completing PT. TARGET for all LTGs 09/02/2017    Time  8    Period  Weeks    Status  New    Target Date  09/02/17      PT LONG TERM GOAL #2   Title  Patient will ambulate modified indepent 500 ft on level, indoor surface with appropriate AD +/- RLE brace.    Time  8    Period  Weeks    Status  New      PT LONG TERM GOAL #3   Title  Patient will improved gait velocity to >=2.5 ft/sec with appropriate AD to demonstrate improved safety when walking in the community    Baseline  --    Time  8    Period  Weeks    Status  New      PT LONG TERM GOAL #4   Title  --    Time  --    Period  --    Status  --      PT LONG TERM GOAL #5   Title  --    Time  --    Period  --    Status  --            Plan - 07/26/17 0945    Clinical Impression Statement  Discussed patient's request to decrease his frequency to 1x/week (wants time to work on his exercises between sessions; travels >30 minutes to get to clinic). He has demonstrated a committment to exercising and agreed he will make progress at this frequency. Focused on  safe use of DME with increased time with SPC with rubber quad tip for proper,safe sequencing. Patient did better with RW (able to focus more on RLE performance/control/weight-bearing. He agreed to use the RW inside his home and at a minimum the [facility] when leaves home (recommended RW at all times, however pt will consider). Provided tennis balls for pt to place over back tips of his RW. Practiced ambulation with Rt SpryStep Plus AFO with toe cover on shoe with much improved advancing of RLE without "catching" his rt toes and causing imbalance. Patient agreeable to move forward with Orthotist consult and explained process and potential need for face to face visit with MD (pt now has Medicare and may not require).     Rehab Potential  Good    Clinical Impairments Affecting Rehab Potential  Hx of cancer/mets required second craniotomy with incr weakness    PT Frequency  2x / week    PT Duration  8 weeks    PT Treatment/Interventions  ADLs/Self Care Home Management;Aquatic Therapy;Gait training;DME Instruction;Stair training;Functional mobility training;Therapeutic activities;Therapeutic exercise;Balance training;Neuromuscular re-education;Cognitive remediation;Manual techniques;Orthotic Fit/Training;Patient/family education;Passive range of motion;Energy conservation    PT Next Visit Plan  Check gait velocity with and without AFO with RW; pre-gait/gait/lower extremity strength/NMR; provide fall prevention education    Consulted and Agree with Plan of Care  Patient       Patient will benefit from skilled therapeutic intervention in order to improve the following deficits and impairments:  Abnormal gait, Decreased  activity tolerance, Decreased balance, Decreased cognition, Decreased endurance, Decreased knowledge of use of DME, Decreased mobility, Decreased strength, Decreased safety awareness, Decreased range of motion, Impaired perceived functional ability, Impaired tone, Impaired UE functional use  Visit  Diagnosis: Muscle weakness (generalized)  Other abnormalities of gait and mobility  Other symptoms and signs involving the nervous system     Problem List Patient Active Problem List   Diagnosis Date Noted  . Brain tumor ([place]) 05/12/2017  . Right leg weakness 01/12/2017  . Depression 02/11/2016  . Hypertension 12/31/2015  . Chronic fatigue 09/17/2015  . Shoulder pain, left 08/06/2015  . Encounter for antineoplastic immunotherapy 03/12/2015  . Malnutrition of moderate degree 01/17/2015  . S/P craniotomy 12/26/2014  . Brain metastasis ([place]) 12/26/2014  . Primary cancer of left lower lobe of lung ([place]) 12/22/2014  . Right sided weakness 12/12/2014  . Tobacco abuse     Rexanne Mano, PT 07/26/2017, 9:56 AM  [facility] [address] [place], [place], [address] Phone: [phone]   Fax:  [phone]  Name: Timothy Obrien MRN: [id number] Date of Birth: November 03, 1960

## 2017-07-27 ENCOUNTER — Ambulatory Visit: Payer: PRIVATE HEALTH INSURANCE | Admitting: Physical Therapy

## 2017-07-27 NOTE — Addendum Note (Signed)
Addended by: Ventura Sellers on: 07/27/2017 04:43 PM   Modules accepted: Orders

## 2017-07-28 ENCOUNTER — Ambulatory Visit: Payer: PRIVATE HEALTH INSURANCE | Admitting: Physical Therapy

## 2017-08-01 ENCOUNTER — Ambulatory Visit: Payer: Medicare Other | Admitting: Physical Therapy

## 2017-08-01 ENCOUNTER — Encounter: Payer: Self-pay | Admitting: Physical Therapy

## 2017-08-01 DIAGNOSIS — R29818 Other symptoms and signs involving the nervous system: Secondary | ICD-10-CM | POA: Diagnosis not present

## 2017-08-01 DIAGNOSIS — R2689 Other abnormalities of gait and mobility: Secondary | ICD-10-CM

## 2017-08-01 DIAGNOSIS — M6281 Muscle weakness (generalized): Secondary | ICD-10-CM | POA: Diagnosis not present

## 2017-08-01 NOTE — Therapy (Signed)
[facility] [address] [place], [place], [address] Phone: [phone]   Fax:  [phone]  Physical Therapy Treatment  Patient Details  Name: Timothy Obrien MRN: [id number] Date of Birth: [birth year] Referring Provider: Ventura Sellers, MD   Encounter Date: 08/01/2017  PT End of Session - 08/01/17 1222    Visit Number  4    Number of Visits  17    Date for PT Re-Evaluation  09/02/17    Authorization Type  Medicare A/B     Authorization Time Period  07/04/17 to 10/02/17    PT Start Time  1017    PT Stop Time  1101    PT Time Calculation (min)  44 min    Activity Tolerance  Patient tolerated treatment well    Behavior During Therapy  [facility] for tasks assessed/performed       Past Medical History:  Diagnosis Date  . Anxiety   . Bronchitis   . Chronic fatigue 09/17/2015  . Depression 02/11/2016  . Hypertension 12/31/2015   states never been told  . 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 Pneumonia   . Seizures ([place])    last 12/11/14  . Shortness of breath dyspnea    with exertion  . Tobacco abuse     Past Surgical History:  Procedure Laterality Date  . APPLICATION OF CRANIAL NAVIGATION N/A 12/26/2014   Procedure: APPLICATION OF CRANIAL NAVIGATION;  Surgeon: Kevan Ny Ditty, MD;  Location: [place] [facility];  Service: Neurosurgery;  Laterality: N/A;  . APPLICATION OF CRANIAL NAVIGATION Left 05/12/2017   Procedure: APPLICATION OF CRANIAL NAVIGATION;  Surgeon: Consuella Lose, MD;  Location: [place];  Service: Neurosurgery;  Laterality: Left;  APPLICATION OF CRANIAL NAVIGATION  . CRANIOTOMY N/A 12/26/2014   Procedure: CRANIOTOMY TUMOR EXCISION with BrainLab;  Surgeon: Kevan Ny Ditty, MD;  Location: [place] [facility];  Service: Neurosurgery;  Laterality: N/A;  CRANIOTOMY TUMOR EXCISION with Stealth  . CRANIOTOMY Left 05/12/2017   Procedure: STEREOTACTIC LEFT FRONTOPERIETIAL CRANIOTOMY FOR  RESECTION OF TUMOR;  Surgeon: Consuella Lose, MD;  Location: [place];  Service: Neurosurgery;  Laterality: Left;  STEREOTACTIC LEFT FRONTOPERIETIAL CRANIOTOMY FOR RESECTION OF TUMOR  . HERNIA REPAIR    . VASECTOMY    . VIDEO ASSISTED THORACOSCOPY (VATS)/WEDGE RESECTION Left 01/14/2015   Procedure: VIDEO ASSISTED THORACOSCOPY (VATS)/WEDGE RESECTION, Superior segmentectomy left  lower lobe, wedge resection of left upper lobe, multiple lymph node disection, On Q insertion.;  Surgeon: Grace Isaac, MD;  Location: [place];  Service: Thoracic;  Laterality: Left;  Marland Kitchen VIDEO BRONCHOSCOPY Bilateral 12/16/2014   Procedure: VIDEO BRONCHOSCOPY WITH FLUORO;  Surgeon: Collene Gobble, MD;  Location: [place];  Service: Cardiopulmonary;  Laterality: Bilateral;  . VIDEO BRONCHOSCOPY N/A 01/14/2015   Procedure: VIDEO BRONCHOSCOPY;  Surgeon: Grace Isaac, MD;  Location: [facility];  Service: Thoracic;  Laterality: N/A;    There were no vitals filed for this visit.  Subjective Assessment - 08/01/17 1021    Subjective  Continues to see slow improvement in RLE strength. No ?s re: HEP. Has not heard from [employer] to schedule appt re: AFO. Has not used cane or RW since last visit.     Pertinent History  December 2016:  initial c/o seizure, characterized as "right sided shaking"; this led to an MRI which demonstrated an enhancing mass consistent with metastatic adenocarcinoma. (treated with resection and chemo) MRI in July 2018 demonstrated  possible progression in brain; 05/13/15 lt frontal craniotomy for resection tumor and found radiation necrosis (not return of tumor/cancer)    Limitations  Walking    How long can you walk comfortably?  grocery store is often too much walking    Patient Stated Goals  Pt's goals for therapy to get RLE stronger and get rid of the weakness; avoid falling    Currently in Pain?  No/denies                       [facility] Adult PT Treatment/Exercise - 08/01/17 1055       Transfers   Transfers  Floor to Transfer    Floor to Transfer  5: Supervision;6: Modified independent (Device/Increase time)    Floor to Transfer Details (indicate cue type and reason)  able to get down to floor modified independent (incr time/effort) x 3 reps; up from floor with use of mat table and without with pt progressing from supervision with cues to modified independent    Comments  pt thankful for practice as he had a difficulty time getting off the floor when he previously fell and has intentionally not tried to get down on the floor to exercise as he was afraid he could not get back up      Ambulation/Gait   Ambulation/Gait Assistance  4: Min guard    Ambulation/Gait Assistance Details  R foot drags multiple times with gait; educated again in use of RW and purpose (not only for safety, but allows pt to walk more naturally and want his "brain to learn" bringing his RLE through in as normal a pattern as possible (not externally rotating and circumducting)    Ambulation Distance (Feet)  80 Feet 100, 120, 120    Assistive device  Rolling walker with simulated leather toe cap;;None   Gait Pattern  Decreased arm swing - right;Decreased step length - right;Decreased stance time - right;Decreased step length - left;Decreased dorsiflexion - right;Decreased weight shift to right;Right foot flat;Right flexed knee in stance;Lateral trunk lean to left;Poor foot clearance - right;Right circumduction;Step-through pattern    Ambulation Surface  Level;Indoor    Gait velocity  32.8/23.97=1.37 ft/sec RW 32.8/22.4=1.46 ft/sec no device             PT Education - 08/01/17 1221    Education Details  results of STGs, especially gait velocity and incr fall risk; recommend use of RW at all times and rationale for improved gait dynamics, not just decr fall risk    Person(s) Educated  Patient    Methods  Explanation;Demonstration;Tactile cues;Verbal cues    Comprehension  Verbalized understanding;Returned  demonstration;Verbal cues required;Tactile cues required       PT Short Term Goals - 08/01/17 1223      PT SHORT TERM GOAL #1   Title  Patient will be independent with basic HEP for strengthening and balance. TARGET for all STGs 08/03/2017    Time  4    Period  Weeks    Status  Achieved      PT SHORT TERM GOAL #2   Title  Patient will improve gait velocity to >1.81 ft/sec with appropriate AD and/or brace to demonstrate lesser fall risk with gait.     Baseline  7/23 no device 1.46 ft/sec; with RW 1.37 ft/sec (today only 2nd time pt has used RW); ongoing With LTG updated     Time  4    Period  Weeks    Status  On-going  PT SHORT TERM GOAL #3   Title  Patient will state fall prevention measures he has instituted in his home after fall prevention education.     Baseline  7/23 education provided this date    Time  4    Period  Weeks    Status  On-going      PT SHORT TERM GOAL #4   Title  Patient will demonstrate modified independent floor to stand transfer with support of furniture.     Time  4    Period  Weeks    Status  Achieved        PT Long Term Goals - 08/01/17 1225      PT LONG TERM GOAL #1   Title  Pt will be independent with updated HEP for improved strength, balance, and gait and able to verbalize his plan for continued community-based exercise program upon completing PT. TARGET for all LTGs 09/02/2017    Time  8    Period  Weeks    Status  New      PT LONG TERM GOAL #2   Title  Patient will ambulate modified indepent 500 ft on level, indoor surface with appropriate AD +/- RLE brace.    Time  8    Period  Weeks    Status  New      PT LONG TERM GOAL #3   Title  Patient will improved gait velocity to >=1.81 ft/sec with appropriate AD to demonstrate improved safety when walking in the community. (7/23 modified based on progress at STGs)    Time  8    Period  Weeks    Status  New            Plan - 08/01/17 1226    Clinical Impression Statement  STGs  assessed this date with pt meeting 2 of 4 goals and 2 of 4 goals are ongoing (pt did decrease his frequency to 1x week after goals established, therefore has only had 4 visits as opposed to the 8 that were originally planned for). Session focused on gait training with RW to improve RLE dynamics and educate pt on value of walking with RW (beyond reducing his fall risk). Patient can continue to benefit from PT to achieve goals.     Rehab Potential  Good    Clinical Impairments Affecting Rehab Potential  Hx of cancer/mets required second craniotomy with incr weakness    PT Frequency  2x / week    PT Duration  8 weeks    PT Treatment/Interventions  ADLs/Self Care Home Management;Aquatic Therapy;Gait training;DME Instruction;Stair training;Functional mobility training;Therapeutic activities;Therapeutic exercise;Balance training;Neuromuscular re-education;Cognitive remediation;Manual techniques;Orthotic Fit/Training;Patient/family education;Passive range of motion;Energy conservation    PT Next Visit Plan  Has he got an appt with [employer]? with whom, so we can communicate our findings?; did he review fall prevention and safe tips for falling handouts provided? any questions or things he learned?; pre-gait/gait/right lower extremity strength/NMR;     Consulted and Agree with Plan of Care  Patient       Patient will benefit from skilled therapeutic intervention in order to improve the following deficits and impairments:  Abnormal gait, Decreased activity tolerance, Decreased balance, Decreased cognition, Decreased endurance, Decreased knowledge of use of DME, Decreased mobility, Decreased strength, Decreased safety awareness, Decreased range of motion, Impaired perceived functional ability, Impaired tone, Impaired UE functional use  Visit Diagnosis: Muscle weakness (generalized)  Other abnormalities of gait and mobility  Other symptoms and signs involving the nervous  system     Problem List Patient  Active Problem List   Diagnosis Date Noted  . Brain tumor ([place]) 05/12/2017  . Right leg weakness 01/12/2017  . Depression 02/11/2016  . Hypertension 12/31/2015  . Chronic fatigue 09/17/2015  . Shoulder pain, left 08/06/2015  . Encounter for antineoplastic immunotherapy 03/12/2015  . Malnutrition of moderate degree 01/17/2015  . S/P craniotomy 12/26/2014  . Brain metastasis ([place]) 12/26/2014  . Primary cancer of left lower lobe of lung ([place]) 12/22/2014  . Right sided weakness 12/12/2014  . Tobacco abuse     Rexanne Mano, PT 08/01/2017, 12:31 PM  [facility] [address] [place], [place], [address] Phone: [phone]   Fax:  [phone]  Name: BRIDGET WESTBROOKS MRN: [id number] Date of Birth: 1960-04-25

## 2017-08-03 ENCOUNTER — Ambulatory Visit: Payer: PRIVATE HEALTH INSURANCE | Admitting: Physical Therapy

## 2017-08-08 ENCOUNTER — Ambulatory Visit: Payer: PRIVATE HEALTH INSURANCE | Admitting: Physical Therapy

## 2017-08-10 ENCOUNTER — Encounter: Payer: Self-pay | Admitting: Physical Therapy

## 2017-08-10 ENCOUNTER — Encounter: Payer: Self-pay | Admitting: *Deleted

## 2017-08-10 ENCOUNTER — Ambulatory Visit: Payer: Medicare Other | Attending: Internal Medicine | Admitting: Physical Therapy

## 2017-08-10 DIAGNOSIS — R2689 Other abnormalities of gait and mobility: Secondary | ICD-10-CM | POA: Insufficient documentation

## 2017-08-10 DIAGNOSIS — R29818 Other symptoms and signs involving the nervous system: Secondary | ICD-10-CM | POA: Diagnosis not present

## 2017-08-10 DIAGNOSIS — M6281 Muscle weakness (generalized): Secondary | ICD-10-CM | POA: Diagnosis not present

## 2017-08-10 NOTE — Therapy (Signed)
[facility] [address] [place], [place], [address] Phone: [phone]   Fax:  [phone]  Physical Therapy Treatment  Patient Details  Name: Timothy Obrien MRN: [id number] Date of Birth: 1960-02-07 Referring Provider: Ventura Sellers, MD   Encounter Date: 08/10/2017  PT End of Session - 08/10/17 1031    Visit Number  5    Number of Visits  17    Date for PT Re-Evaluation  09/02/17    Authorization Type  Medicare A/B     Authorization Time Period  07/04/17 to 10/02/17    PT Start Time  0933    PT Stop Time  1015    PT Time Calculation (min)  42 min    Activity Tolerance  Patient tolerated treatment well    Behavior During Therapy  [facility] for tasks assessed/performed       Past Medical History:  Diagnosis Date  . Anxiety   . Bronchitis   . Chronic fatigue 09/17/2015  . Depression 02/11/2016  . Hypertension 12/31/2015   states never been told  . Lung cancer ([place])    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  . Pneumonia   . Seizures ([place])    last 12/11/14  . Shortness of breath dyspnea    with exertion  . Tobacco abuse     Past Surgical History:  Procedure Laterality Date  . APPLICATION OF CRANIAL NAVIGATION N/A 12/26/2014   Procedure: APPLICATION OF CRANIAL NAVIGATION;  Surgeon: Kevan Ny Ditty, MD;  Location: [place] NE[facility];  Service: Neurosurgery;  Laterality: N/A;  . APPLICATION OF CRANIAL NAVIGATION Left 05/12/2017   Procedure: APPLICATION OF CRANIAL NAVIGATION;  Surgeon: Consuella Lose, MD;  Location: [place];  Service: Neurosurgery;  Laterality: Left;  APPLICATION OF CRANIAL NAVIGATION  . CRANIOTOMY N/A 12/26/2014   Procedure: CRANIOTOMY TUMOR EXCISION with BrainLab;  Surgeon: Kevan Ny Ditty, MD;  Location: [place] [facility]S;  Service: Neurosurgery;  Laterality: N/A;  CRANIOTOMY TUMOR EXCISION with Stealth  . CRANIOTOMY Left 05/12/2017   Procedure: STEREOTACTIC LEFT FRONTOPERIETIAL CRANIOTOMY FOR  RESECTION OF TUMOR;  Surgeon: Consuella Lose, MD;  Location: [place];  Service: Neurosurgery;  Laterality: Left;  STEREOTACTIC LEFT FRONTOPERIETIAL CRANIOTOMY FOR RESECTION OF TUMOR  . HERNIA REPAIR    . VASECTOMY    . VIDEO ASSISTED THORACOSCOPY (VATS)/WEDGE RESECTION Left 01/14/2015   Procedure: VIDEO ASSISTED THORACOSCOPY (VATS)/WEDGE RESECTION, Superior segmentectomy left  lower lobe, wedge resection of left upper lobe, multiple lymph node disection, On Q insertion.;  Surgeon: Grace Isaac, MD;  Location: [place];  Service: Thoracic;  Laterality: Left;  Marland Kitchen VIDEO BRONCHOSCOPY Bilateral 12/16/2014   Procedure: VIDEO BRONCHOSCOPY WITH FLUORO;  Surgeon: Collene Gobble, MD;  Location: [place];  Service: Cardiopulmonary;  Laterality: Bilateral;  . VIDEO BRONCHOSCOPY N/A 01/14/2015   Procedure: VIDEO BRONCHOSCOPY;  Surgeon: Grace Isaac, MD;  Location: [facility] OR;  Service: Thoracic;  Laterality: N/A;    There were no vitals filed for this visit.  Subjective Assessment - 08/10/17 0936    Subjective  No falls. Talked to [employer] and they needed his insurance information but did not make an appointment with him. Has practiced with his RW maybe twice.     Pertinent History  December 2016:  initial c/o seizure, characterized as "right sided shaking"; this led to an MRI which demonstrated an enhancing mass consistent with metastatic adenocarcinoma. (treated with resection and chemo) MRI in July 2018 demonstrated possible progression in brain; 05/13/15  lt frontal craniotomy for resection tumor and found radiation necrosis (not return of tumor/cancer)    Limitations  Walking    How long can you walk comfortably?  grocery store is often too much walking    Patient Stated Goals  Pt's goals for therapy to get RLE stronger and get rid of the weakness; avoid falling    Currently in Pain?  No/denies         treatment-  Self care-follow-up discussion re: working with [employer] to obtain rt AFO; pt had  received one call from them ~ one week ago asking for his insurance (see clinical impression for details)  There-ex-  -standing on low rocker board with rt foot in center (in anterior-posterior direction) and toe touch on his left foot (for safety); working on heel raises with pt's calf activating but unable to push beyond neutral; then rocking back into dorsiflexion x 10 reps;   -then switched to blue airex with feet ~3" apart with eyes closed to elicit balance reactions at ankle (minimal noted on right with left primarily maintaining his balance);   -prone knee flexion (AAROM for right LE--left assisting right) x 10 reps  -seated knee flexion and pulling foot underneath his knee. Notes at home he cannot move beyond 90 degrees but on higher mat table he can. Suggested he sit on a slightly higher surface at home to allow him to get incr ROM/activation of hamstrings.  Gait training- with rt SpryStep Plus AFO and simulated leather toe cap x 250 ft with min assist to facilitate rt hip extension in weight-bearing, incr stance time on RLE (so he doesn't "hop" over his RLE due to fear of buckling at knee), and improved rt foot clearance                       PT Education - 08/10/17 1030    Education Details  addition to HEP; use RW for improved gait dynamics/timing    Person(s) Educated  Patient    Methods  Explanation;Demonstration;Verbal cues;Handout    Comprehension  Verbalized understanding;Returned demonstration;Verbal cues required       PT Short Term Goals - 08/01/17 1223      PT SHORT TERM GOAL #1   Title  Patient will be independent with basic HEP for strengthening and balance. TARGET for all STGs 08/03/2017    Time  4    Period  Weeks    Status  Achieved      PT SHORT TERM GOAL #2   Title  Patient will improve gait velocity to >1.81 ft/sec with appropriate AD and/or brace to demonstrate lesser fall risk with gait.     Baseline  7/23 no device 1.46 ft/sec; with  RW 1.37 ft/sec (today only 2nd time pt has used RW); ongoing With LTG updated     Time  4    Period  Weeks    Status  On-going      PT SHORT TERM GOAL #3   Title  Patient will state fall prevention measures he has instituted in his home after fall prevention education.     Baseline  7/23 education provided this date    Time  4    Period  Weeks    Status  On-going      PT SHORT TERM GOAL #4   Title  Patient will demonstrate modified independent floor to stand transfer with support of furniture.     Time  4  Period  Weeks    Status  Achieved        PT Long Term Goals - 08/01/17 1225      PT LONG TERM GOAL #1   Title  Pt will be independent with updated HEP for improved strength, balance, and gait and able to verbalize his plan for continued community-based exercise program upon completing PT. TARGET for all LTGs 09/02/2017    Time  8    Period  Weeks    Status  New      PT LONG TERM GOAL #2   Title  Patient will ambulate modified indepent 500 ft on level, indoor surface with appropriate AD +/- RLE brace.    Time  8    Period  Weeks    Status  New      PT LONG TERM GOAL #3   Title  Patient will improved gait velocity to >=1.81 ft/sec with appropriate AD to demonstrate improved safety when walking in the community. (7/23 modified based on progress at STGs)    Time  [place] - 08/10/17 1033    Clinical Impression Statement  Session included updating his HEP to include prone knee flexion with AAROM (left leg assisting right for full ROM), ankle strengthening and balance training, gait training with Rt AFO, and follow-up re: where things stand with getting an appointiment with orthotist (referral to [employer]). Patient reported they called a week ago to get his insurance information but did not set up an appt. Called office and receptionist stated they had sent Dr. Mickeal Skinner a "detailed Prescription" they need for Medicare and are  awaiting the signed prescription. Patient continues to work on his strength and rports he is now able to lift his leg into his truck easier.     Rehab Potential  Good    Clinical Impairments Affecting Rehab Potential  Hx of cancer/mets required second craniotomy with incr weakness    PT Frequency  2x / week    PT Duration  8 weeks    PT Treatment/Interventions  ADLs/Self Care Home Management;Aquatic Therapy;Gait training;DME Instruction;Stair training;Functional mobility training;Therapeutic activities;Therapeutic exercise;Balance training;Neuromuscular re-education;Cognitive remediation;Manual techniques;Orthotic Fit/Training;Patient/family education;Passive range of motion;Energy conservation    PT Next Visit Plan  Has he got an appt with [employer]? with whom, so we can communicate our findings?; try pre-gait anterior wtshifting onto RLE (?with RW for support in case of knee buckle) and add to HEP if appropriate); right lower extremity strength (?SAQ or SLR with hip ER);  gait with Rt THusane SpryStep Plus AFO and toe cover on shoe    Consulted and Agree with Plan of Care  Patient       Patient will benefit from skilled therapeutic intervention in order to improve the following deficits and impairments:  Abnormal gait, Decreased activity tolerance, Decreased balance, Decreased cognition, Decreased endurance, Decreased knowledge of use of DME, Decreased mobility, Decreased strength, Decreased safety awareness, Decreased range of motion, Impaired perceived functional ability, Impaired tone, Impaired UE functional use  Visit Diagnosis: Muscle weakness (generalized)  Other abnormalities of gait and mobility     Problem List Patient Active Problem List   Diagnosis Date Noted  . Brain tumor ([place]) 05/12/2017  . Right leg weakness 01/12/2017  . Depression 02/11/2016  . Hypertension 12/31/2015  . Chronic fatigue 09/17/2015  . Shoulder pain, left 08/06/2015  . Encounter for antineoplastic  immunotherapy 03/12/2015  . Malnutrition of moderate degree 01/17/2015  . S/P craniotomy 12/26/2014  . Brain metastasis ([place]) 12/26/2014  . Primary cancer of left lower lobe of lung ([place]) 12/22/2014  . Right sided weakness 12/12/2014  . Tobacco abuse     Rexanne Mano, PT 08/10/2017, 10:44 AM  [facility] [address] [place], [place], [address] Phone: [phone]   Fax:  [phone]  Name: Timothy Obrien MRN: [id number] Date of Birth: 12-14-1960

## 2017-08-10 NOTE — Progress Notes (Signed)
Received order from [employer] [facility] for AFO to right foot.  Order signed and returned via mail. Copy scanned to chart

## 2017-08-10 NOTE — Patient Instructions (Signed)
Access Code: [email or web address]  URL: [email or web address]  Date: 08/10/2017  Prepared by: Barry Brunner   Exercises  Long Sitting Calf Stretch with Strap - 10 reps - 2 sets - 3-5 secs hold - 1x daily - 7x weekly  Sit to Stand with Hands on Knees - 10 reps - 2 sets - 1x daily - 7x weekly  Standing Marching - 5-10 reps - 2 sets - 1x daily - 7x weekly  Mini Squat - 10 reps - 2 sets - 1x daily - 7x weekly  Standing Alternating Knee Flexion - 10 reps - 2 sets - 1x daily - 7x weekly  Prone Knee Flexion AAROM with Overpressure - 10 reps - 2 sets - 1x daily - 7x weekly

## 2017-08-15 ENCOUNTER — Ambulatory Visit: Payer: Medicare Other | Admitting: Physical Therapy

## 2017-08-15 ENCOUNTER — Encounter: Payer: Self-pay | Admitting: Physical Therapy

## 2017-08-15 DIAGNOSIS — M6281 Muscle weakness (generalized): Secondary | ICD-10-CM | POA: Diagnosis not present

## 2017-08-15 DIAGNOSIS — R29818 Other symptoms and signs involving the nervous system: Secondary | ICD-10-CM

## 2017-08-15 DIAGNOSIS — R2689 Other abnormalities of gait and mobility: Secondary | ICD-10-CM | POA: Diagnosis not present

## 2017-08-15 NOTE — Patient Instructions (Signed)
Access Code: [email or web address]  URL: [email or web address]  Date: 08/15/2017  Prepared by: Barry Brunner   Exercises  Long Sitting Calf Stretch with Strap - 10 reps - 2 sets - 3-5 secs hold - 1x daily - 7x weekly  Sit to Stand with Hands on Knees - 10 reps - 2 sets - 1x daily - 7x weekly  Standing Marching - 5-10 reps - 2 sets - 1x daily - 7x weekly  Mini Squat - 10 reps - 2 sets - 1x daily - 7x weekly  Standing Alternating Knee Flexion - 10 reps - 2 sets - 1x daily - 7x weekly  Prone Knee Flexion AAROM with Overpressure - 10 reps - 2 sets - 1x daily - 7x weekly

## 2017-08-16 ENCOUNTER — Telehealth: Payer: Self-pay | Admitting: Internal Medicine

## 2017-08-16 NOTE — Telephone Encounter (Signed)
Patient called to reschedule lab on the same day as ct

## 2017-08-16 NOTE — Therapy (Signed)
[facility] [address] [place], [place], [address] Phone: [phone]   Fax:  [phone]  Physical Therapy Treatment  Patient Details  Name: Timothy Obrien MRN: [id number] Date of Birth: Nov 28, 1960 Referring Provider: Ventura Sellers, MD   Encounter Date: 08/15/2017  PT End of Session - 08/16/17 0736    Visit Number  6    Number of Visits  17    Date for PT Re-Evaluation  09/02/17    Authorization Type  Medicare A/B     Authorization Time Period  07/04/17 to 10/02/17    PT Start Time  1020    PT Stop Time  1103    PT Time Calculation (min)  43 min    Activity Tolerance  Patient tolerated treatment well    Behavior During Therapy  [facility] for tasks assessed/performed       Past Medical History:  Diagnosis Date  . Anxiety   . Bronchitis   . Chronic fatigue 09/17/2015  . Depression 02/11/2016  . Hypertension 12/31/2015   states never been told  . 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 Pneumonia   . Seizures ([place])    last 12/11/14  . Shortness of breath dyspnea    with exertion  . Tobacco abuse     Past Surgical History:  Procedure Laterality Date  . APPLICATION OF CRANIAL NAVIGATION N/A 12/26/2014   Procedure: APPLICATION OF CRANIAL NAVIGATION;  Surgeon: Kevan Ny Ditty, MD;  Location: [place] [facility];  Service: Neurosurgery;  Laterality: N/A;  . APPLICATION OF CRANIAL NAVIGATION Left 05/12/2017   Procedure: APPLICATION OF CRANIAL NAVIGATION;  Surgeon: Consuella Lose, MD;  Location: [facility];  Service: Neurosurgery;  Laterality: Left;  APPLICATION OF CRANIAL NAVIGATION  . CRANIOTOMY N/A 12/26/2014   Procedure: CRANIOTOMY TUMOR EXCISION with BrainLab;  Surgeon: Kevan Ny Ditty, MD;  Location: [place];  Service: Neurosurgery;  Laterality: N/A;  CRANIOTOMY TUMOR EXCISION with Stealth  . CRANIOTOMY Left 05/12/2017   Procedure: STEREOTACTIC LEFT FRONTOPERIETIAL CRANIOTOMY FOR  RESECTION OF TUMOR;  Surgeon: Consuella Lose, MD;  Location: [facility];  Service: Neurosurgery;  Laterality: Left;  STEREOTACTIC LEFT FRONTOPERIETIAL CRANIOTOMY FOR RESECTION OF TUMOR  . HERNIA REPAIR    . VASECTOMY    . VIDEO ASSISTED THORACOSCOPY (VATS)/WEDGE RESECTION Left 01/14/2015   Procedure: VIDEO ASSISTED THORACOSCOPY (VATS)/WEDGE RESECTION, Superior segmentectomy left  lower lobe, wedge resection of left upper lobe, multiple lymph node disection, On Q insertion.;  Surgeon: Grace Isaac, MD;  Location: [facility];  Service: Thoracic;  Laterality: Left;  Marland Kitchen VIDEO BRONCHOSCOPY Bilateral 12/16/2014   Procedure: VIDEO BRONCHOSCOPY WITH FLUORO;  Surgeon: Collene Gobble, MD;  Location: [facility];  Service: Cardiopulmonary;  Laterality: Bilateral;  . VIDEO BRONCHOSCOPY N/A 01/14/2015   Procedure: VIDEO BRONCHOSCOPY;  Surgeon: Grace Isaac, MD;  [facility];  Service: Thoracic;  Laterality: N/A;    There were no vitals filed for this visit.  Subjective Assessment - 08/15/17 1025    Subjective  No changes. No falls. Almost fall caught toe on doormat and caught himself.     Pertinent History  December 2016:  initial c/o seizure, characterized as "right sided shaking"; this led to an MRI which demonstrated an enhancing mass consistent with metastatic adenocarcinoma. (treated with resection and chemo) MRI in July 2018 demonstrated possible progression in brain; 05/13/15 lt frontal craniotomy for resection tumor and found radiation necrosis (not return of  tumor/cancer)    Limitations  Walking    How long can you walk comfortably?  grocery store is often too much walking    Patient Stated Goals  Pt's goals for therapy to get RLE stronger and get rid of the weakness; avoid falling    Currently in Pain?  No/denies                       [facility] Adult PT Treatment/Exercise - 08/15/17 1048      Exercises   Exercises  Knee/Hip      Knee/Hip Exercises: Machines for Strengthening    Total Gym Leg Press  RLE only 40# x 10; 60# x 10      Knee/Hip Exercises: Standing   Rocker Board Limitations  in // bars, RLE only on rocker in ant/post direction, alternating PF and DF with 5 sec holds x 10; attempted lateral for inversion/eversion with pt unable to move rockerboard     Knee/Hip Exercises: Supine   Short Arc Quad Sets  Strengthening;Right;2 sets;10 reps 5 sec hold; 2nd set with red band      Mini-squats RLE only at counter-20 reps, 2 sets        PT Education - 08/16/17 0734    Education Details  updates to HEP; call [employer] to set up appt (appears per chart they should now have the order they needed), call PT and leave message re: when and with whom appt is    Person(s) Educated  Patient    Methods  Explanation;Demonstration;Verbal cues;Handout    Comprehension  Verbalized understanding;Returned demonstration;Verbal cues required;Need further instruction       PT Short Term Goals - 08/01/17 1223      PT SHORT TERM GOAL #1   Title  Patient will be independent with basic HEP for strengthening and balance. TARGET for all STGs 08/03/2017    Time  4    Period  Weeks    Status  Achieved      PT SHORT TERM GOAL #2   Title  Patient will improve gait velocity to >1.81 ft/sec with appropriate AD and/or brace to demonstrate lesser fall risk with gait.     Baseline  7/23 no device 1.46 ft/sec; with RW 1.37 ft/sec (today only 2nd time pt has used RW); ongoing With LTG updated     Time  4    Period  Weeks    Status  On-going      PT SHORT TERM GOAL #3   Title  Patient will state fall prevention measures he has instituted in his home after fall prevention education.     Baseline  7/23 education provided this date    Time  4    Period  Weeks    Status  On-going      PT SHORT TERM GOAL #4   Title  Patient will demonstrate modified independent floor to stand transfer with support of furniture.     Time  4    Period  Weeks    Status  Achieved        PT Long Term  Goals - 08/01/17 1225      PT LONG TERM GOAL #1   Title  Pt will be independent with updated HEP for improved strength, balance, and gait and able to verbalize his plan for continued community-based exercise program upon completing PT. TARGET for all LTGs 09/02/2017    Time  8    Period  Weeks  Status  New      PT LONG TERM GOAL #2   Title  Patient will ambulate modified indepent 500 ft on level, indoor surface with appropriate AD +/- RLE brace.    Time  8    Period  Weeks    Status  New      PT LONG TERM GOAL #3   Title  Patient will improved gait velocity to >=1.81 ft/sec with appropriate AD to demonstrate improved safety when walking in the community. (7/23 modified based on progress at [facility])    Time  [place] - 08/16/17 0737    Clinical Impression Statement  Focus on RLE strengthening with continuing to update HEP as pt progresses. Informed pt of call to [employer] and they were awaiting a more detailed prescription from Dr. Mickeal Skinner. Noted in chart that Dr. Renda Rolls nurse mailed this back to [employer] on 8/1 and pt should call to get an appt. Patient will be greatly helped by AFO with reducing fall risk and energy expenditure with gait. Continue to work towards goals.     Rehab Potential  Good    Clinical Impairments Affecting Rehab Potential  Hx of cancer/mets required second craniotomy with incr weakness    PT Frequency  2x / week    PT Duration  8 weeks    PT Treatment/Interventions  ADLs/Self Care Home Management;Aquatic Therapy;Gait training;DME Instruction;Stair training;Functional mobility training;Therapeutic activities;Therapeutic exercise;Balance training;Neuromuscular re-education;Cognitive remediation;Manual techniques;Orthotic Fit/Training;Patient/family education;Passive range of motion;Energy conservation    PT Next Visit Plan  Has he got an appt with [employer]? with whom, so we can communicate our findings?; try stepper legs  only; right lower extremity strength;  gait with Rt THusane SpryStep Plus AFO and toe cover on shoe    Consulted and Agree with Plan of Care  Patient       Patient will benefit from skilled therapeutic intervention in order to improve the following deficits and impairments:  Abnormal gait, Decreased activity tolerance, Decreased balance, Decreased cognition, Decreased endurance, Decreased knowledge of use of DME, Decreased mobility, Decreased strength, Decreased safety awareness, Decreased range of motion, Impaired perceived functional ability, Impaired tone, Impaired UE functional use  Visit Diagnosis: Muscle weakness (generalized)  Other symptoms and signs involving the nervous system     Problem List Patient Active Problem List   Diagnosis Date Noted  . Brain tumor ([place]) 05/12/2017  . Right leg weakness 01/12/2017  . Depression 02/11/2016  . Hypertension 12/31/2015  . Chronic fatigue 09/17/2015  . Shoulder pain, left 08/06/2015  . Encounter for antineoplastic immunotherapy 03/12/2015  . Malnutrition of moderate degree 01/17/2015  . S/P craniotomy 12/26/2014  . Brain metastasis ([place]) 12/26/2014  . Primary cancer of left lower lobe of lung ([place]) 12/22/2014  . Right sided weakness 12/12/2014  . Tobacco abuse     Rexanne Mano, PT 08/16/2017, 7:42 AM  [facility] [address] [place], [place], [address] Phone: [phone]   Fax:  [phone]  Name: THORNTON DOHRMANN MRN: [id number] Date of Birth: 10-03-1960

## 2017-08-17 ENCOUNTER — Ambulatory Visit: Payer: PRIVATE HEALTH INSURANCE | Admitting: Physical Therapy

## 2017-08-18 ENCOUNTER — Ambulatory Visit
Admission: RE | Admit: 2017-08-18 | Discharge: 2017-08-18 | Disposition: A | Discharge status: Home / Self Care | Payer: Medicare Other | Source: Ambulatory Visit | Attending: Internal Medicine | Admitting: Internal Medicine

## 2017-08-18 ENCOUNTER — Other Ambulatory Visit: Payer: PRIVATE HEALTH INSURANCE

## 2017-08-18 ENCOUNTER — Other Ambulatory Visit: Payer: Self-pay | Admitting: Radiation Therapy

## 2017-08-18 DIAGNOSIS — C801 Malignant (primary) neoplasm, unspecified: Secondary | ICD-10-CM | POA: Diagnosis not present

## 2017-08-18 DIAGNOSIS — C711 Malignant neoplasm of frontal lobe: Secondary | ICD-10-CM | POA: Diagnosis not present

## 2017-08-18 DIAGNOSIS — C7931 Secondary malignant neoplasm of brain: Secondary | ICD-10-CM

## 2017-08-18 MED ORDER — GADOBENATE DIMEGLUMINE 529 MG/ML IV SOLN
17.0000 mL | Freq: Once | INTRAVENOUS | Status: AC | PRN
  Administered 2017-08-18: 17 mL via INTRAVENOUS

## 2017-08-21 ENCOUNTER — Inpatient Hospital Stay: Payer: Medicare Other

## 2017-08-21 DIAGNOSIS — C7931 Secondary malignant neoplasm of brain: Secondary | ICD-10-CM | POA: Insufficient documentation

## 2017-08-21 DIAGNOSIS — R531 Weakness: Secondary | ICD-10-CM | POA: Insufficient documentation

## 2017-08-21 DIAGNOSIS — Z87891 Personal history of nicotine dependence: Secondary | ICD-10-CM | POA: Insufficient documentation

## 2017-08-22 ENCOUNTER — Ambulatory Visit: Payer: Medicare Other | Admitting: Physical Therapy

## 2017-08-22 ENCOUNTER — Encounter: Payer: Self-pay | Admitting: Physical Therapy

## 2017-08-22 DIAGNOSIS — R2689 Other abnormalities of gait and mobility: Secondary | ICD-10-CM | POA: Diagnosis not present

## 2017-08-22 DIAGNOSIS — M6281 Muscle weakness (generalized): Secondary | ICD-10-CM

## 2017-08-22 DIAGNOSIS — R29818 Other symptoms and signs involving the nervous system: Secondary | ICD-10-CM

## 2017-08-22 NOTE — Progress Notes (Signed)
Brain and Spine Tumor Board Documentation  Timothy Obrien was presented by Cecil Cobbs, MD at Brain and Spine Tumor Board on 08/22/2017, which included representatives from medical oncology, neuro oncology, radiation oncology, surgical oncology, radiology, pathology, navigation, genetics.  Timothy Obrien was presented as a current patient with history of the following treatments: adjuvant radiation, adjuvant chemotherapy.  Additionally, we reviewed previous medical and familial history, history of present illness, and recent lab results along with all available histopathologic and imaging studies. The tumor board considered available treatment options and made the following recommendations:  active surveillance Progressive changes on MRI, favor inflammatory etiology.  Continue close monitoring and consider steroids  Tumor board is a meeting of clinicians from various specialty areas who evaluate and discuss patients for whom a multidisciplinary approach is being considered. Final determinations in the plan of care are those of the provider(s). The responsibility for follow up of recommendations given during tumor board is that of the provider.   Today's extended care, comprehensive team conference, [name] was not present for the discussion and was not examined.

## 2017-08-22 NOTE — Patient Instructions (Signed)
Access Code: [email or web address]  URL: [email or web address]  Date: 08/22/2017  Prepared by: Barry Brunner   Exercises  Long Sitting Calf Stretch with Strap - 10 reps - 2 sets - 3-5 secs hold - 1x daily - 7x weekly  Sit to Stand with Hands on Knees - 10 reps - 2 sets - 1x daily - 7x weekly  Standing Marching - 5-10 reps - 2 sets - 1x daily - 7x weekly  Mini Squat - 10 reps - 2 sets - 1x daily - 7x weekly  Standing Alternating Knee Flexion - 10 reps - 2 sets - 1x daily - 7x weekly  Prone Knee Flexion AAROM with Overpressure - 10 reps - 2 sets - 1x daily - 7x weekly  ADDED: Standing Terminal Knee Extension with Resistance - 50-100 reps - 1 sets - 5 seconds hold - 1-2x daily - 7x weekly   Discussed and practiced standing at counter, left hand on counter and left foot forward. Practice hip/knee flexion as advancing RLE, shift onto RLE and extend hip and knee. Then step back to start position. Did not give handout with this exercise.

## 2017-08-22 NOTE — Therapy (Signed)
[facility] [address] [place], [place], [address] Phone: [phone]   Fax:  [phone]  Physical Therapy Treatment  Patient Details  Name: Timothy Obrien MRN: [id number] Date of Birth: [birth year] Referring Provider: Ventura Sellers, MD   Encounter Date: 08/22/2017  PT End of Session - 08/22/17 2130    Visit Number  7    Number of Visits  17    Date for PT Re-Evaluation  09/02/17    Authorization Type  Medicare A/B     Authorization Time Period  07/04/17 to 10/02/17    PT Start Time  1015    PT Stop Time  1101    PT Time Calculation (min)  46 min    Activity Tolerance  Patient tolerated treatment well    Behavior During Therapy  [facility] for tasks assessed/performed       Past Medical History:  Diagnosis Date  . Anxiety   . Bronchitis   . Chronic fatigue 09/17/2015  . Depression 02/11/2016  . Hypertension 12/31/2015   states never been told  . Lung cancer ([place])    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  . Pneumonia   . Seizures ([place])    last 12/11/14  . Shortness of breath dyspnea    with exertion  . Tobacco abuse     Past Surgical History:  Procedure Laterality Date  . APPLICATION OF CRANIAL NAVIGATION N/A 12/26/2014   Procedure: APPLICATION OF CRANIAL NAVIGATION;  Surgeon: Kevan Ny Ditty, MD;  Location: [place] [facility];  Service: Neurosurgery;  Laterality: N/A;  . APPLICATION OF CRANIAL NAVIGATION Left 05/12/2017   Procedure: APPLICATION OF CRANIAL NAVIGATION;  Surgeon: Consuella Lose, MD;  Location: [facility];  Service: Neurosurgery;  Laterality: Left;  APPLICATION OF CRANIAL NAVIGATION  . CRANIOTOMY N/A 12/26/2014   Procedure: CRANIOTOMY TUMOR EXCISION with BrainLab;  Surgeon: Kevan Ny Ditty, MD;  Location: [facility];  Service: Neurosurgery;  Laterality: N/A;  CRANIOTOMY TUMOR EXCISION with Stealth  . CRANIOTOMY Left 05/12/2017   Procedure: STEREOTACTIC LEFT FRONTOPERIETIAL CRANIOTOMY FOR  RESECTION OF TUMOR;  Surgeon: Consuella Lose, MD;  Location: [facility];  Service: Neurosurgery;  Laterality: Left;  STEREOTACTIC LEFT FRONTOPERIETIAL CRANIOTOMY FOR RESECTION OF TUMOR  . HERNIA REPAIR    . VASECTOMY    . VIDEO ASSISTED THORACOSCOPY (VATS)/WEDGE RESECTION Left 01/14/2015   Procedure: VIDEO ASSISTED THORACOSCOPY (VATS)/WEDGE RESECTION, Superior segmentectomy left  lower lobe, wedge resection of left upper lobe, multiple lymph node disection, On Q insertion.;  Surgeon: Grace Isaac, MD;  Location: [facility];  Service: Thoracic;  Laterality: Left;  Marland Kitchen VIDEO BRONCHOSCOPY Bilateral 12/16/2014   Procedure: VIDEO BRONCHOSCOPY WITH FLUORO;  Surgeon: Collene Gobble, MD;  Location: [facility];  Service: Cardiopulmonary;  Laterality: Bilateral;  . VIDEO BRONCHOSCOPY N/A 01/14/2015   Procedure: VIDEO BRONCHOSCOPY;  Surgeon: Grace Isaac, MD;  Location: [facility];  Service: Thoracic;  Laterality: N/A;    There were no vitals filed for this visit.  Subjective Assessment - 08/22/17 1019    Subjective  No changes and no falls.    Pertinent History  December 2016:  initial c/o seizure, characterized as "right sided shaking"; this led to an MRI which demonstrated an enhancing mass consistent with metastatic adenocarcinoma. (treated with resection and chemo) MRI in July 2018 demonstrated possible progression in brain; 05/13/15 lt frontal craniotomy for resection tumor and found radiation necrosis (not return of tumor/cancer)    Limitations  Walking  How long can you walk comfortably?  grocery store is often too much walking    Patient Stated Goals  Pt's goals for therapy to get RLE stronger and get rid of the weakness; avoid falling    Currently in Pain?  No/denies         Treatment-  Prior to today's appointment, made call to [employer] to discuss where things stood with MD order, insurance approval and getting pt seen for assessment for best brace. Arranged for Staci Righter, orthotist to attend  today's session to observe pt's gait and discuss best bracing option. Trialed Townsend GRAFO-like carbon-fiber brace. Pt with improved toe clearance, however continued to drag ball of foot against floor during swing and continued with excessive knee flexion in stance. Ultimately, [name] plans to make a custom articulated AFO set in ~5 degrees of DF (as opposed to neutral/zero degrees) to improve toe clearance. Did pre-gait activities at counter addressing incr rt hip and knee flexion during swing and incr hip/knee extension in stance with good carryover into gait with tactile and verbal cues. In stance with rt hip and knee extension, noted knee instability with both hyperextension and partial-buckling occurring. Added terminal knee extension to HEP with green band. Patient made plans to go to [employer] for mold to be made today or tomorrow.                            PT Short Term Goals - 08/01/17 1223      PT SHORT TERM GOAL #1   Title  Patient will be independent with basic HEP for strengthening and balance. TARGET for all STGs 08/03/2017    Time  4    Period  Weeks    Status  Achieved      PT SHORT TERM GOAL #2   Title  Patient will improve gait velocity to >1.81 ft/sec with appropriate AD and/or brace to demonstrate lesser fall risk with gait.     Baseline  7/23 no device 1.46 ft/sec; with RW 1.37 ft/sec (today only 2nd time pt has used RW); ongoing With LTG updated     Time  4    Period  Weeks    Status  On-going      PT SHORT TERM GOAL #3   Title  Patient will state fall prevention measures he has instituted in his home after fall prevention education.     Baseline  7/23 education provided this date    Time  4    Period  Weeks    Status  On-going      PT SHORT TERM GOAL #4   Title  Patient will demonstrate modified independent floor to stand transfer with support of furniture.     Time  4    Period  Weeks    Status  Achieved        PT Long Term Goals -  08/01/17 1225      PT LONG TERM GOAL #1   Title  Pt will be independent with updated HEP for improved strength, balance, and gait and able to verbalize his plan for continued community-based exercise program upon completing PT. TARGET for all LTGs 09/02/2017    Time  8    Period  Weeks    Status  New      PT LONG TERM GOAL #2   Title  Patient will ambulate modified indepent 500 ft on level, indoor surface with appropriate AD +/-  RLE brace.    Time  8    Period  Weeks    Status  New      PT LONG TERM GOAL #3   Title  Patient will improved gait velocity to >=1.81 ft/sec with appropriate AD to demonstrate improved safety when walking in the community. (7/23 modified based on progress at [place])    Time  [place] - 08/22/17 2132    Clinical Impression Statement  Session focused on pre-gait and gait training, including meeting with Staci Righter, orthotist from [facility] to assess best bracing option for pt. HEP updated with terminal knee extension exercise. Patient continues to regain strength and function in RLE.     Rehab Potential  Good    Clinical Impairments Affecting Rehab Potential  Hx of cancer/mets required second craniotomy with incr weakness    PT Frequency  2x / week    PT Duration  8 weeks    PT Treatment/Interventions  ADLs/Self Care Home Management;Aquatic Therapy;Gait training;DME Instruction;Stair training;Functional mobility training;Therapeutic activities;Therapeutic exercise;Balance training;Neuromuscular re-education;Cognitive remediation;Manual techniques;Orthotic Fit/Training;Patient/family education;Passive range of motion;Energy conservation    PT Next Visit Plan  try stepper legs only; right lower extremity strength (especially hip and knee extension in mid-stance; rt knee flexors for improved flexion in swing for foot clearance;  gait with Rt THusane SpryStep Plus AFO and toe cover on shoe    Consulted and Agree with Plan of Care   Patient            Patient will benefit from skilled therapeutic intervention in order to improve the following deficits and impairments:  Abnormal gait, Decreased activity tolerance, Decreased balance, Decreased cognition, Decreased endurance, Decreased knowledge of use of DME, Decreased mobility, Decreased strength, Decreased safety awareness, Decreased range of motion, Impaired perceived functional ability, Impaired tone, Impaired UE functional use  Visit Diagnosis: Muscle weakness (generalized)  Other symptoms and signs involving the nervous system  Other abnormalities of gait and mobility     Problem List Patient Active Problem List   Diagnosis Date Noted  . Brain tumor ([place]) 05/12/2017  . Right leg weakness 01/12/2017  . Depression 02/11/2016  . Hypertension 12/31/2015  . Chronic fatigue 09/17/2015  . Shoulder pain, left 08/06/2015  . Encounter for antineoplastic immunotherapy 03/12/2015  . Malnutrition of moderate degree 01/17/2015  . S/P craniotomy 12/26/2014  . Brain metastasis ([place]) 12/26/2014  . Primary cancer of left lower lobe of lung ([place]) 12/22/2014  . Right sided weakness 12/12/2014  . Tobacco abuse     Rexanne Mano, PT 08/22/2017, 9:54 PM  [facility] [address] [place], [place], [address] Phone: [phone]   Fax:  [phone]  Name: Timothy Obrien MRN: [id number] Date of Birth: 1960-12-23

## 2017-08-23 ENCOUNTER — Inpatient Hospital Stay (HOSPITAL_BASED_OUTPATIENT_CLINIC_OR_DEPARTMENT_OTHER): Payer: Medicare Other | Admitting: Internal Medicine

## 2017-08-23 ENCOUNTER — Encounter: Payer: Self-pay | Admitting: Internal Medicine

## 2017-08-23 ENCOUNTER — Inpatient Hospital Stay: Payer: Medicare Other | Attending: Internal Medicine

## 2017-08-23 ENCOUNTER — Telehealth: Payer: Self-pay | Admitting: *Deleted

## 2017-08-23 VITALS — BP 148/106 | HR 82 | Temp 97.5°F | Resp 18 | Ht 72.0 in | Wt 184.2 lb

## 2017-08-23 DIAGNOSIS — C7931 Secondary malignant neoplasm of brain: Secondary | ICD-10-CM | POA: Diagnosis not present

## 2017-08-23 DIAGNOSIS — R531 Weakness: Secondary | ICD-10-CM | POA: Diagnosis not present

## 2017-08-23 DIAGNOSIS — C3432 Malignant neoplasm of lower lobe, left bronchus or lung: Secondary | ICD-10-CM | POA: Diagnosis present

## 2017-08-23 DIAGNOSIS — Z87891 Personal history of nicotine dependence: Secondary | ICD-10-CM

## 2017-08-23 LAB — CBC WITH DIFFERENTIAL/PLATELET
Basophils Absolute: 0.1 10*3/uL (ref 0.0–0.1)
Basophils Relative: 1 %
Eosinophils Absolute: 0.1 10*3/uL (ref 0.0–0.5)
Eosinophils Relative: 2 %
HCT: 47.5 % (ref 38.4–49.9)
Hemoglobin: 15.7 g/dL (ref 13.0–17.1)
Lymphocytes Relative: 21 %
Lymphs Abs: 1.5 10*3/uL (ref 0.9–3.3)
MCH: 30.5 pg (ref 27.2–33.4)
MCHC: 32.9 g/dL (ref 32.0–36.0)
MCV: 92.6 fL (ref 79.3–98.0)
Monocytes Absolute: 0.8 10*3/uL (ref 0.1–0.9)
Monocytes Relative: 12 %
Neutro Abs: 4.5 10*3/uL (ref 1.5–6.5)
Neutrophils Relative %: 64 %
Platelets: 292 10*3/uL (ref 140–400)
RBC: 5.13 MIL/uL (ref 4.20–5.82)
RDW: 14.4 % (ref 11.0–14.6)
WBC: 6.9 10*3/uL (ref 4.0–10.3)

## 2017-08-23 LAB — COMPREHENSIVE METABOLIC PANEL
ALT: 19 U/L (ref 0–44)
AST: 23 U/L (ref 15–41)
Albumin: 3.5 g/dL (ref 3.5–5.0)
Alkaline Phosphatase: 100 U/L (ref 38–126)
Anion gap: 12 (ref 5–15)
BUN: 19 mg/dL (ref 6–20)
CO2: 19 mmol/L — ABNORMAL LOW (ref 22–32)
Calcium: 8.5 mg/dL — ABNORMAL LOW (ref 8.9–10.3)
Chloride: 112 mmol/L — ABNORMAL HIGH (ref 98–111)
Creatinine, Ser: 1.22 mg/dL (ref 0.61–1.24)
GFR calc Af Amer: >60 mL/min (ref >60)
GFR calc non Af Amer: >60 mL/min (ref >60)
Glucose, Bld: 84 mg/dL (ref 70–99)
Potassium: 4.5 mmol/L (ref 3.5–5.1)
Sodium: 143 mmol/L (ref 135–145)
Total Bilirubin: 0.2 mg/dL — ABNORMAL LOW (ref 0.3–1.2)
Total Protein: 6.5 g/dL (ref 6.5–8.1)

## 2017-08-23 MED ORDER — LEVETIRACETAM 500 MG PO TABS: 500.0000 mg | ORAL_TABLET | Freq: Two times a day (BID) | ORAL | 5 refills | Status: DC

## 2017-08-23 MED ORDER — DEXAMETHASONE 4 MG PO TABS: 4.0000 mg | ORAL_TABLET | Freq: Every day | ORAL | 0 refills | Status: DC

## 2017-08-23 NOTE — Progress Notes (Signed)
[facility] [place], [place] [address] [phone]   Interval Patient Evaluation  Date of Service: 08/23/17 Patient Name: Timothy Obrien Patient MRN: [id number] Patient DOB: November 13, 1960 Provider: Ventura Sellers, MD  Identifying Statement:  Timothy Obrien is a 57 y.o. male with No primary diagnosis found.   Primary Cancer: NSCLC Stage IV  Oncologic History: 12/25/14: MRI demonstrates left frontal mestastasis, SRS is performed followed by resection. 12/23/16: Patient describes right leg weakness, some progression noted on MRI 05/12/17: Resection of left frontal progressive lesion.  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  Interval History:  Timothy Obrien presents today for follow up after recent MRI brain.  He describes relative worsening of right leg weakness since last visit, although he is still able to ambulate independently.  He occasionally trips over his right foot.  Has also noticed some clumsiness in his right hand when trying to write or use his toothbrush.  This is again new since last visit.  Also having mild headaches, posterior.  Denies seizures.  Medications: Current Outpatient Medications on File Prior to Visit  Medication Sig Dispense Refill  . acetaminophen-codeine (TYLENOL #3) 300-30 MG tablet Take 1 tablet by mouth every 6 (six) hours as needed for moderate pain. (Patient not taking: Reported on 05/23/2017) 60 tablet 0  . ALPRAZolam (XANAX) 0.25 MG tablet Take 0.25 mg by mouth daily as needed for anxiety.     Marland Kitchen dexamethasone (DECADRON) 1 MG tablet Take 1 tablet (1 mg total) by mouth daily. (Patient not taking: Reported on 07/04/2017) 30 tablet 0  . dexamethasone (DECADRON) 4 MG tablet Take 1 tablet (4 mg total) by mouth daily. Take two tablets a day for three days then stop. 30 tablet 0  . levETIRAcetam (KEPPRA) 500 MG tablet Take 1 tablet (500 mg total) by mouth every 12 (twelve) hours. (Patient not taking: Reported  on 07/04/2017) 60 tablet 1  . mirtazapine (REMERON) 30 MG tablet Take 1 tablet (30 mg total) by mouth at bedtime. 90 tablet 0  . pentoxifylline (TRENTAL) 400 MG CR tablet Take 1 tablet (400 mg total) by mouth 2 (two) times daily after a meal. (Patient not taking: Reported on 05/23/2017) 60 tablet 5  . vitamin E 400 UNIT capsule Take 400 Units by mouth 2 (two) times daily.     No current facility-administered medications on file prior to visit.     Allergies: No Known Allergies Past Medical History:  Past Medical History:  Diagnosis Date  . Anxiety   . Bronchitis   . Chronic fatigue 09/17/2015  . Depression 02/11/2016  . Hypertension 12/31/2015   states never been told  . Lung cancer ([place])    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  . Pneumonia   . Seizures ([place])    last 12/11/14  . Shortness of breath dyspnea    with exertion  . Tobacco abuse    Past Surgical History:  Past Surgical History:  Procedure Laterality Date  . APPLICATION OF CRANIAL NAVIGATION N/A 12/26/2014   Procedure: APPLICATION OF CRANIAL NAVIGATION;  Surgeon: Kevan Ny Ditty, MD;  Location: [facility];  Service: Neurosurgery;  Laterality: N/A;  . APPLICATION OF CRANIAL NAVIGATION Left 05/12/2017   Procedure: APPLICATION OF CRANIAL NAVIGATION;  Surgeon: Consuella Lose, MD;  Location: [facility];  Service: Neurosurgery;  Laterality: Left;  APPLICATION OF CRANIAL NAVIGATION  . CRANIOTOMY N/A 12/26/2014   Procedure: CRANIOTOMY TUMOR EXCISION with BrainLab;  Surgeon:  Kevan Ny Ditty, MD;  Location: [facility];  Service: Neurosurgery;  Laterality: N/A;  CRANIOTOMY TUMOR EXCISION with Stealth  . CRANIOTOMY Left 05/12/2017   Procedure: STEREOTACTIC LEFT FRONTOPERIETIAL CRANIOTOMY FOR RESECTION OF TUMOR;  Surgeon: Consuella Lose, MD;  Location: [facility];  Service: Neurosurgery;  Laterality: Left;  STEREOTACTIC LEFT FRONTOPERIETIAL CRANIOTOMY FOR RESECTION OF TUMOR  . HERNIA REPAIR    . VASECTOMY    . VIDEO ASSISTED  THORACOSCOPY (VATS)/WEDGE RESECTION Left 01/14/2015   Procedure: VIDEO ASSISTED THORACOSCOPY (VATS)/WEDGE RESECTION, Superior segmentectomy left  lower lobe, wedge resection of left upper lobe, multiple lymph node disection, On Q insertion.;  Surgeon: Grace Isaac, MD;  Location: [facility];  Service: Thoracic;  Laterality: Left;  Marland Kitchen VIDEO BRONCHOSCOPY Bilateral 12/16/2014   Procedure: VIDEO BRONCHOSCOPY WITH FLUORO;  Surgeon: Collene Gobble, MD;  Location: [facility];  Service: Cardiopulmonary;  Laterality: Bilateral;  . VIDEO BRONCHOSCOPY N/A 01/14/2015   Procedure: VIDEO BRONCHOSCOPY;  Surgeon: Grace Isaac, MD;  [facility];  Service: Thoracic;  Laterality: N/A;   Social History:  Social History   Socioeconomic History  . Marital status: Divorced    Spouse name: Not on file  . Number of children: Not on file  . Years of education: Not on file  . Highest education level: Not on file  Occupational History  . Not on file  Social Needs  . Financial resource strain: Not on file  . Food insecurity:    Worry: Not on file    Inability: Not on file  . Transportation needs:    Medical: Not on file    Non-medical: Not on file  Tobacco Use  . Smoking status: Former Smoker    Packs/day: 1.00    Years: 36.00    Pack years: 36.00    Types: Cigarettes    Start date: 12/14/1979    Last attempt to quit: 10/07/2015    Years since quitting: 1.8  . Smokeless tobacco: Current User  . Tobacco comment: Vapes: Few cigars a day. Quit cigarettes after easter.  Substance and Sexual Activity  . Alcohol use: Yes    Alcohol/week: 21.0 standard drinks    Types: 21 Cans of beer per week    Comment: 3 beers a day   . Drug use: Yes    Types: Marijuana    Comment: last time 01/04/15  . Sexual activity: Not Currently  Lifestyle  . Physical activity:    Days per week: Not on file    Minutes per session: Not on file  . Stress: Not on file  Relationships  . Social connections:    Talks on phone:  Not on file    Gets together: Not on file    Attends religious service: Not on file    Active member of club or organization: Not on file    Attends meetings of clubs or organizations: Not on file    Relationship status: Not on file  . Intimate partner violence:    Fear of current or ex partner: Not on file    Emotionally abused: Not on file    Physically abused: Not on file    Forced sexual activity: Not on file  Other Topics Concern  . 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 He does have a cat but no other home pets. No mold exposure. Recent travel to [place] but with symptoms at the onset of  travel.   Family History:  Family History  Problem Relation Age of Onset  . Breast cancer Mother   . Colon polyps Mother   . Arthritis Father     Review of Systems: Constitutional: Denies fevers, chills or abnormal weight loss Eyes: Denies blurriness of vision Ears, nose, mouth, throat, and face: Denies mucositis or sore throat Respiratory: Denies cough, dyspnea or wheezes Cardiovascular: Denies palpitation, chest discomfort or lower extremity swelling Gastrointestinal:  Denies nausea, constipation, diarrhea GU: Denies dysuria or incontinence Skin: Denies abnormal skin rashes Neurological: Per HPI Musculoskeletal: Denies joint pain, back or neck discomfort. No decrease in ROM Behavioral/Psych: Denies anxiety, disturbance in thought content, and mood instability   Physical Exam: Vitals:   08/23/17 1239  BP: (!) 148/106  Pulse: 82  Resp: 18  Temp: (!) 97.5 F (36.4 C)  SpO2: 97%   KPS: 80. General: Alert, cooperative, pleasant, in no acute distress Head: Normal EENT: No conjunctival injection or scleral icterus. Oral mucosa moist Lungs: Resp effort normal Cardiac: Regular rate and rhythm Abdomen: Soft, non-distended abdomen Skin: No rashes cyanosis or petechiae. Extremities: No clubbing or  edema  Neurologic Exam: Mental Status: Awake, alert, attentive to examiner. Oriented to self and environment. Language is fluent with intact comprehension.  Cranial Nerves: Visual acuity is grossly normal. Visual fields are full. Extra-ocular movements intact. No ptosis. Face is symmetric, tongue midline. Motor: Tone and bulk are normal. Pronator drift in right, he is 4/5 in right leg. Reflexes are increased on left with mild spasticity and 10 beats right ankle clonus.  Sensory: Intact to light touch and temperature Gait: Hemiparetic gait  Labs: I have reviewed the data as listed    Component Value Date/Time   NA 138 05/13/2017 0752   NA 137 01/12/2017 0846   K 4.6 05/13/2017 0752   K 4.4 01/12/2017 0846   CL 106 05/13/2017 0752   CO2 23 05/13/2017 0752   CO2 18 (L) 01/12/2017 0846   GLUCOSE 138 (H) 05/13/2017 0752   GLUCOSE 91 01/12/2017 0846   BUN 12 05/13/2017 0752   BUN 15.3 01/12/2017 0846   CREATININE 1.10 05/13/2017 0752   CREATININE 1.19 05/04/2017 1134   CREATININE 1.0 01/12/2017 0846   CALCIUM 8.4 (L) 05/13/2017 0752   CALCIUM 9.0 01/12/2017 0846   PROT 7.7 05/04/2017 1134   PROT 7.2 01/12/2017 0846   ALBUMIN 4.1 05/04/2017 1134   ALBUMIN 3.9 01/12/2017 0846   AST 25 05/04/2017 1134   AST 23 01/12/2017 0846   ALT 30 05/04/2017 1134   ALT 22 01/12/2017 0846   ALKPHOS 83 05/04/2017 1134   ALKPHOS 99 01/12/2017 0846   BILITOT 0.4 05/04/2017 1134   BILITOT 0.41 01/12/2017 0846   GFRNONAA >60 05/13/2017 0752   GFRNONAA >60 05/04/2017 1134   GFRAA >60 05/13/2017 0752   GFRAA >60 05/04/2017 1134   Lab Results  Component Value Date   WBC 16.9 (H) 05/13/2017   NEUTROABS 15.1 (H) 05/13/2017   HGB 13.6 05/13/2017   HCT 41.4 05/13/2017   MCV 98.6 05/13/2017   PLT 285 05/13/2017    Imaging:  Mr [name] EN Contrast  Result Date: 08/18/2017 CLINICAL DATA:  Lung cancer diagnosed 12/2014 with left frontal lobe metastasis treated with radiation and surgery. Two year  5 month restaging. EXAM: MRI HEAD WITHOUT AND WITH CONTRAST TECHNIQUE: Multiplanar, multiecho pulse sequences of the brain and surrounding structures were obtained without and with intravenous contrast. CONTRAST:  84m MULTIHANCE GADOBENATE DIMEGLUMINE 529 MG/ML IV  SOLN COMPARISON:  Brain MRI 05/13/2017 and 04/18/2017 FINDINGS: BRAIN New Lesions: None. Larger lesions: Treated enhancing lesion located in the posterior right frontal lobe now measures up to 18 mm, with increased surrounding hyperintense T2-weighted signal. There is minimal appreciable contrast enhancement at this resection site previously. Stable or Smaller lesions: None. Other Brain findings: The midline structures are normal. There is no acute infarct or acute hemorrhage. The brain parenchymal signal is normal for the patient's age. Advanced atrophy for age. No chronic microhemorrhage or superficial siderosis. Vascular: Major intracranial arterial and venous sinus flow voids are preserved. Skull and upper cervical spine: Left frontal parietal craniotomy. Sinuses/orbits: No fluid levels or advanced mucosal thickening. No mastoid or middle ear effusion. Normal orbits. IMPRESSION: 1. Residual or recurrent contrast enhancing tumor at the left frontal lobe resection site, markedly worsened from the prior study, with increased surrounding edema. 2. No new metastatic lesions. 3. Advanced atrophy for age. Electronically Signed   By: Ulyses Jarred M.D.   On: 08/18/2017 23:02    [facility] Clinician Interpretation: I have personally reviewed the radiological images as listed.  My interpretation, in the context of the patient's clinical presentation, is treatment effect vs true progression   Assessment/Plan 1. Brain metastasis ([place])  2. Right leg weakness  Mr. Demarais has progressive clinical changes, in the context of changes on MRI which suggest inflammation or treatment effect over neoplasm.  Changes are lacking tumorigenicity and mass effect.  Recent  resection histology of course demonstrated radiation necrosis.     Because of clinical changes, he should resume decadron.  We ask that he dose 84m BID for 3 days, then 480mdaily for the next 7 days, then call usKoreaith an update.  He should also resume taking his Keppra 50020mID as prior.  We strongly recommended continuation of physical therapy exercises to recover motor function in the right leg.  Will continue to benefit from rt AFO given noted foot drop and recent falls.  He should return to clinic in 3 months with an MRI for review, or sooner as needed depending on response to steroids.  We appreciate the opportunity to participate in the care of WilJAMASON PECKHAMWe will maintain open communication regarding future treatment pathways and formal follow up.  All questions were answered. The patient knows to call the clinic with any problems, questions or concerns. No barriers to learning were detected.  The total time spent in the encounter was 40 minutes and more than 50% was on counseling and review of test results   ZacVentura SellersD Medical Director of Neuro-Oncology [facility] [place]/14/19 12:36 PM

## 2017-08-23 NOTE — Telephone Encounter (Signed)
TC from pt's wife @10 :30 am. She states that pt is seeing Dr. Mickeal Skinner today (primary oncologist is Dr. Julien Nordmann)  She is asking if patient would be able to have labs today for CT Scan scheduled for next week. Meesage sent to desk nurse

## 2017-08-24 ENCOUNTER — Telehealth: Payer: Self-pay

## 2017-08-24 ENCOUNTER — Ambulatory Visit: Payer: PRIVATE HEALTH INSURANCE | Admitting: Physical Therapy

## 2017-08-24 NOTE — Telephone Encounter (Signed)
Spoke with patient concerning his upcoming appointment. Per 8/14 los. He is aware and has MyChart (declined calender and letter to be mailed).

## 2017-08-25 ENCOUNTER — Other Ambulatory Visit: Payer: BLUE CROSS/BLUE SHIELD

## 2017-08-28 ENCOUNTER — Other Ambulatory Visit: Payer: Self-pay | Admitting: *Deleted

## 2017-08-28 DIAGNOSIS — C7931 Secondary malignant neoplasm of brain: Secondary | ICD-10-CM

## 2017-08-29 ENCOUNTER — Ambulatory Visit: Payer: BLUE CROSS/BLUE SHIELD | Admitting: Internal Medicine

## 2017-08-29 ENCOUNTER — Ambulatory Visit: Payer: Medicare Other | Admitting: Physical Therapy

## 2017-08-29 ENCOUNTER — Encounter: Payer: Self-pay | Admitting: Physical Therapy

## 2017-08-29 DIAGNOSIS — R29818 Other symptoms and signs involving the nervous system: Secondary | ICD-10-CM

## 2017-08-29 DIAGNOSIS — R2689 Other abnormalities of gait and mobility: Secondary | ICD-10-CM

## 2017-08-29 DIAGNOSIS — M6281 Muscle weakness (generalized): Secondary | ICD-10-CM | POA: Diagnosis not present

## 2017-08-29 NOTE — Therapy (Signed)
[facility] [address] [place], [place], [address] Phone: [phone]   Fax:  [phone]  Physical Therapy Treatment  Patient Details  Name: Timothy Obrien MRN: [id number] Date of Birth: [birth year] Referring Provider: Ventura Sellers, MD   Encounter Date: 08/29/2017  PT End of Session - 08/29/17 0800    Visit Number  8    Number of Visits  17    Date for PT Re-Evaluation  09/02/17    Authorization Type  Medicare A/B     Authorization Time Period  07/04/17 to 10/02/17    PT Start Time  1017    PT Stop Time  1102    PT Time Calculation (min)  45 min    Activity Tolerance  Patient tolerated treatment well    Behavior During Therapy  [facility] for tasks assessed/performed       Past Medical History:  Diagnosis Date  . Anxiety   . Bronchitis   . Chronic fatigue 09/17/2015  . Depression 02/11/2016  . Hypertension 12/31/2015   states never been told  . Lung cancer ([place])    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  . Pneumonia   . Seizures ([place])    last 12/11/14  . Shortness of breath dyspnea    with exertion  . Tobacco abuse     Past Surgical History:  Procedure Laterality Date  . APPLICATION OF CRANIAL NAVIGATION N/A 12/26/2014   Procedure: APPLICATION OF CRANIAL NAVIGATION;  Surgeon: Kevan Ny Ditty, MD;  Location: [facility];  Service: Neurosurgery;  Laterality: N/A;  . APPLICATION OF CRANIAL NAVIGATION Left 05/12/2017   Procedure: APPLICATION OF CRANIAL NAVIGATION;  Surgeon: Consuella Lose, MD;  Location: [facility];  Service: Neurosurgery;  Laterality: Left;  APPLICATION OF CRANIAL NAVIGATION  . CRANIOTOMY N/A 12/26/2014   Procedure: CRANIOTOMY TUMOR EXCISION with BrainLab;  Surgeon: Kevan Ny Ditty, MD;  Location: [facility];  Service: Neurosurgery;  Laterality: N/A;  CRANIOTOMY TUMOR EXCISION with Stealth  . CRANIOTOMY Left 05/12/2017   Procedure: STEREOTACTIC LEFT FRONTOPERIETIAL CRANIOTOMY FOR  RESECTION OF TUMOR;  Surgeon: Consuella Lose, MD;  Location: [facility];  Service: Neurosurgery;  Laterality: Left;  STEREOTACTIC LEFT FRONTOPERIETIAL CRANIOTOMY FOR RESECTION OF TUMOR  . HERNIA REPAIR    . VASECTOMY    . VIDEO ASSISTED THORACOSCOPY (VATS)/WEDGE RESECTION Left 01/14/2015   Procedure: VIDEO ASSISTED THORACOSCOPY (VATS)/WEDGE RESECTION, Superior segmentectomy left  lower lobe, wedge resection of left upper lobe, multiple lymph node disection, On Q insertion.;  Surgeon: Grace Isaac, MD;  Location: [facility];  Service: Thoracic;  Laterality: Left;  Marland Kitchen VIDEO BRONCHOSCOPY Bilateral 12/16/2014   Procedure: VIDEO BRONCHOSCOPY WITH FLUORO;  Surgeon: Collene Gobble, MD;  Location: [facility];  Service: Cardiopulmonary;  Laterality: Bilateral;  . VIDEO BRONCHOSCOPY N/A 01/14/2015   Procedure: VIDEO BRONCHOSCOPY;  Surgeon: Grace Isaac, MD;  Location: [facility] OR;  Service: Thoracic;  Laterality: N/A;    There were no vitals filed for this visit.  Subjective Assessment - 08/29/17 1019    Subjective  Never heard from [employer] re: possible cost of AFO. He's about given up on the idea of an AFO. Dr. Mickeal Skinner restarted his decadron. He has noticed improvements in right side strength and function with steroids.     Pertinent History  December 2016:  initial c/o seizure, characterized as "right sided shaking"; this led to an MRI which demonstrated an enhancing mass consistent with metastatic adenocarcinoma. (treated with resection and chemo)  MRI in July 2018 demonstrated possible progression in brain; 05/13/15 lt frontal craniotomy for resection tumor and found radiation necrosis (not return of tumor/cancer)    Limitations  Walking    How long can you walk comfortably?  grocery store is often too much walking    Patient Stated Goals  Pt's goals for therapy to get RLE stronger and get rid of the weakness; avoid falling    Currently in Pain?  No/denies                       [facility] Adult PT  Treatment/Exercise - 08/29/17 2046      Ambulation/Gait   Ambulation/Gait Assistance  4: Min guard    Ambulation/Gait Assistance Details  on arrival, noted rt knee in lesser degree of flexion in stance, however continued foot drop and poor clearance of rt foot in swing; trialed Ossur foot-up brace with improved rt foot clearance     Ambulation Distance (Feet)  80 Feet   100, 60, 60   Assistive device  None   no brace; foot-up brace; PLS poly AFO   Gait Pattern  Decreased arm swing - right;Decreased step length - right;Decreased stance time - right;Decreased step length - left;Decreased dorsiflexion - right;Decreased weight shift to right;Right foot flat;Right flexed knee in stance;Lateral trunk lean to left;Poor foot clearance - right;Right circumduction;Step-through pattern    Ambulation Surface  Level    Pre-Gait Activities  in // bars with verbal and tactile cues, rt heelstrike through midstance with hip and knee extension in wt-bearing      Knee/Hip Exercises: Aerobic   Elliptical  4 min (3 forward, 1 backward) with bil UE support and minguard assist             PT Education - 08/29/17 2053    Education Details  with improved RLE response due to steroids, can try soft foot-up brace; pt educated in how to don foot-up brace (and pt return demonstrated). Also educated in use of posterior leaf spring poly AFO.     Person(s) Educated  Patient    Methods  Explanation;Demonstration;Verbal cues    Comprehension  Verbalized understanding;Returned demonstration       PT Short Term Goals - 08/01/17 1223      PT SHORT TERM GOAL #1   Title  Patient will be independent with basic HEP for strengthening and balance. TARGET for all STGs 08/03/2017    Time  4    Period  Weeks    Status  Achieved      PT SHORT TERM GOAL #2   Title  Patient will improve gait velocity to >1.81 ft/sec with appropriate AD and/or brace to demonstrate lesser fall risk with gait.     Baseline  7/23 no device 1.46  ft/sec; with RW 1.37 ft/sec (today only 2nd time pt has used RW); ongoing With LTG updated     Time  4    Period  Weeks    Status  On-going      PT SHORT TERM GOAL #3   Title  Patient will state fall prevention measures he has instituted in his home after fall prevention education.     Baseline  7/23 education provided this date    Time  4    Period  Weeks    Status  On-going      PT SHORT TERM GOAL #4   Title  Patient will demonstrate modified independent floor to stand transfer with support  of furniture.     Time  4    Period  Weeks    Status  Achieved        PT Long Term Goals - 08/01/17 1225      PT LONG TERM GOAL #1   Title  Pt will be independent with updated HEP for improved strength, balance, and gait and able to verbalize his plan for continued community-based exercise program upon completing PT. TARGET for all LTGs 09/02/2017    Time  8    Period  Weeks    Status  New      PT LONG TERM GOAL #2   Title  Patient will ambulate modified indepent 500 ft on level, indoor surface with appropriate AD +/- RLE brace.    Time  8    Period  Weeks    Status  New      PT LONG TERM GOAL #3   Title  Patient will improved gait velocity to >=1.81 ft/sec with appropriate AD to demonstrate improved safety when walking in the community. (7/23 modified based on progress at [facility])    Time  [place] - 08/29/17 2056    Clinical Impression Statement  Patient, understandably, frustrated with process to obtain an AFO and plans to order something he has seen online for ~$30. Able to trial similar braces owned by the clinic and pt decided he wants to orde the Ossur foot-up brace.  Wiith patient's several week history of improved strength in RLE, and the recent improvement after initiating decadron, the foot up brace appears to be the best match. Patient instructed he does not need an order from his physican for foot-up brace as they are not covered  by insurance.     Rehab Potential  Good    Clinical Impairments Affecting Rehab Potential  Hx of cancer/mets required second craniotomy with incr weakness    PT Frequency  2x / week    PT Duration  8 weeks    PT Treatment/Interventions  ADLs/Self Care Home Management;Aquatic Therapy;Gait training;DME Instruction;Stair training;Functional mobility training;Therapeutic activities;Therapeutic exercise;Balance training;Neuromuscular re-education;Cognitive remediation;Manual techniques;Orthotic Fit/Training;Patient/family education;Passive range of motion;Energy conservation    PT Next Visit Plan  check LTGs; likely d/c with plan to conitnue at private gym; try stepper legs only; right lower extremity strength (especially hip and knee extension in mid-stance; rt knee flexors for improved flexion in swing for foot clearance;  gait with Rt THusane SpryStep Plus AFO and toe cover on shoe    Consulted and Agree with Plan of Care  Patient       Patient will benefit from skilled therapeutic intervention in order to improve the following deficits and impairments:  Abnormal gait, Decreased activity tolerance, Decreased balance, Decreased cognition, Decreased endurance, Decreased knowledge of use of DME, Decreased mobility, Decreased strength, Decreased safety awareness, Decreased range of motion, Impaired perceived functional ability, Impaired tone, Impaired UE functional use  Visit Diagnosis: Muscle weakness (generalized)  Other symptoms and signs involving the nervous system  Other abnormalities of gait and mobility     Problem List Patient Active Problem List   Diagnosis Date Noted  . Brain tumor ([place]) 05/12/2017  . Right leg weakness 01/12/2017  . Depression 02/11/2016  . Hypertension 12/31/2015  . Chronic fatigue 09/17/2015  . Shoulder pain, left 08/06/2015  . Encounter for antineoplastic immunotherapy 03/12/2015  . Malnutrition of  moderate degree 01/17/2015  . S/P craniotomy 12/26/2014  .  Brain metastasis ([place]) 12/26/2014  . Primary cancer of left lower lobe of lung ([place]) 12/22/2014  . Right sided weakness 12/12/2014  . Tobacco abuse     Jeanie Cooks Heil, PT 08/29/2017, 9:07 PM  [facility] [address] [place], [place], [address] Phone: [phone]   Fax:  [phone]  Name: Timothy Obrien MRN: [id number] Date of Birth: 1960/10/20

## 2017-08-31 ENCOUNTER — Other Ambulatory Visit: Payer: PRIVATE HEALTH INSURANCE

## 2017-08-31 ENCOUNTER — Ambulatory Visit (HOSPITAL_COMMUNITY)
Admission: RE | Admit: 2017-08-31 | Discharge: 2017-08-31 | Disposition: A | Discharge status: Home / Self Care | Payer: Medicare Other | Source: Ambulatory Visit | Attending: Internal Medicine | Admitting: Internal Medicine

## 2017-08-31 ENCOUNTER — Ambulatory Visit: Payer: PRIVATE HEALTH INSURANCE | Admitting: Physical Therapy

## 2017-08-31 DIAGNOSIS — N400 Enlarged prostate without lower urinary tract symptoms: Secondary | ICD-10-CM | POA: Diagnosis not present

## 2017-08-31 DIAGNOSIS — J439 Emphysema, unspecified: Secondary | ICD-10-CM | POA: Diagnosis not present

## 2017-08-31 DIAGNOSIS — C3490 Malignant neoplasm of unspecified part of unspecified bronchus or lung: Secondary | ICD-10-CM | POA: Diagnosis not present

## 2017-08-31 DIAGNOSIS — I700 Atherosclerosis of aorta: Secondary | ICD-10-CM | POA: Diagnosis not present

## 2017-08-31 DIAGNOSIS — R918 Other nonspecific abnormal finding of lung field: Secondary | ICD-10-CM | POA: Insufficient documentation

## 2017-08-31 MED ORDER — IOHEXOL 300 MG/ML  SOLN
100.0000 mL | Freq: Once | INTRAMUSCULAR | Status: AC | PRN
  Administered 2017-08-31: 100 mL via INTRAVENOUS

## 2017-09-01 ENCOUNTER — Telehealth: Payer: Self-pay | Admitting: *Deleted

## 2017-09-01 NOTE — Telephone Encounter (Signed)
TC from patient regarding how he is tolerating new prescription of Decadron.  Pt started decadron approx 1 week ago for brain mets and right leg/foot issues.  Pt states he is doing better-about 30% better. He has not noted any particular side effects at this time.  He states that Dr. Mickeal Skinner wanted pt to let him know how he feels after a week on the decadron.  Pt has enough decadron for now. Message forwarded to Dr. Mickeal Skinner and his nurse.

## 2017-09-04 ENCOUNTER — Ambulatory Visit: Payer: PRIVATE HEALTH INSURANCE | Admitting: Physical Therapy

## 2017-09-04 ENCOUNTER — Inpatient Hospital Stay (HOSPITAL_BASED_OUTPATIENT_CLINIC_OR_DEPARTMENT_OTHER): Payer: Medicare Other | Admitting: Internal Medicine

## 2017-09-04 ENCOUNTER — Encounter: Payer: Self-pay | Admitting: Internal Medicine

## 2017-09-04 DIAGNOSIS — C7931 Secondary malignant neoplasm of brain: Secondary | ICD-10-CM | POA: Diagnosis not present

## 2017-09-04 DIAGNOSIS — C3490 Malignant neoplasm of unspecified part of unspecified bronchus or lung: Secondary | ICD-10-CM

## 2017-09-04 DIAGNOSIS — R531 Weakness: Secondary | ICD-10-CM | POA: Diagnosis not present

## 2017-09-04 DIAGNOSIS — Z87891 Personal history of nicotine dependence: Secondary | ICD-10-CM | POA: Diagnosis not present

## 2017-09-04 DIAGNOSIS — C3432 Malignant neoplasm of lower lobe, left bronchus or lung: Secondary | ICD-10-CM

## 2017-09-04 NOTE — Progress Notes (Signed)
[name] [phone]   [phone]  OFFICE PROGRESS NOTE  Briscoe Deutscher, MD [address] [place] [address]  DIAGNOSIS:  Stage IV (T2a, N1, M1b) non-small cell lung cancer, adenocarcinoma, negative EGFR mutation but equivocal EGFR amplification, negative ALK gene translocation and negative ROS 1 but with PDL-1 expression 100% presented with left lower lobe lung mass in addition to left hilar adenopathy and solitary metastatic brain lesion diagnosed in December 2016.  PRIOR THERAPY: 1) status post stereotactic radiotherapy and surgical resection diagnosed in December 2016. 2) status post video bronchoscopy with left VATS and wedge resection of the left upper lobe and left lower lobe superior segmentectomy with lymph node dissection under the care of Dr. Servando Snare on 01/14/2015. 3) First-line treatment with immunotherapy with Ketruda (pembrolizumab) 200 mg IV every 3 weeks status post 35 cycles. First cycle was given 02/18/2015.  Discontinued after the patient completed 2 years of treatment. 4) stereotactic left frontal craniotomy for resection of tumor and the final pathology was consistent with tumor necrosis.  This was done on 05/12/2017  CURRENT THERAPY: Observation.  INTERVAL HISTORY: Timothy Obrien 57 y.o. male returns to the clinic today for 3 months follow-up visit.  The patient is feeling fine except for weakness in the lower extremities.  His last MRI of the brain showed residual or recurrent enhancing tumor at the left frontal lobe resection site and the patient was a started on a taper dose of Decadron.  He completed his steroid few days ago.  He is followed by Dr. Mickeal Skinner.  He denied having any other significant complaints.  He has no chest pain, shortness of breath, cough or hemoptysis.  He has no nausea, vomiting, diarrhea or constipation.  He is here today for evaluation with repeat CT scan of the chest, abdomen and pelvis for  restaging of his disease.  MEDICAL HISTORY: Past Medical History:  Diagnosis Date  . Anxiety   . Bronchitis   . Chronic fatigue 09/17/2015  . Depression 02/11/2016  . Hypertension 12/31/2015   states never been told  . Lung cancer ([facility])    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  . Pneumonia   . Seizures ([facility])    last 12/11/14  . Shortness of breath dyspnea    with exertion  . Tobacco abuse     ALLERGIES:  has No Known Allergies.  MEDICATIONS:  Current Outpatient Medications  Medication Sig Dispense Refill  . acetaminophen-codeine (TYLENOL #3) 300-30 MG tablet Take 1 tablet by mouth every 6 (six) hours as needed for moderate pain. (Patient not taking: Reported on 05/23/2017) 60 tablet 0  . ALPRAZolam (XANAX) 0.25 MG tablet Take 0.25 mg by mouth daily as needed for anxiety.     Marland Kitchen dexamethasone (DECADRON) 1 MG tablet Take 1 tablet (1 mg total) by mouth daily. (Patient not taking: Reported on 07/04/2017) 30 tablet 0  . dexamethasone (DECADRON) 4 MG tablet Take 1 tablet (4 mg total) by mouth daily. Take two tablets a day for first three days 30 tablet 0  . levETIRAcetam (KEPPRA) 500 MG tablet Take 1 tablet (500 mg total) by mouth every 12 (twelve) hours. 60 tablet 5  . mirtazapine (REMERON) 30 MG tablet Take 1 tablet (30 mg total) by mouth at bedtime. 90 tablet 0  . pentoxifylline (TRENTAL) 400 MG CR tablet Take 1 tablet (400 mg total) by mouth 2 (two) times daily after a meal. (Patient not taking:  Reported on 05/23/2017) 60 tablet 5  . vitamin E 400 UNIT capsule Take 400 Units by mouth 2 (two) times daily.     No current facility-administered medications for this visit.     SURGICAL HISTORY:  Past Surgical History:  Procedure Laterality Date  . APPLICATION OF CRANIAL NAVIGATION N/A 12/26/2014   Procedure: APPLICATION OF CRANIAL NAVIGATION;  Surgeon: Kevan Ny Ditty, MD;  Location: [facility];  Service: Neurosurgery;  Laterality: N/A;  . APPLICATION OF CRANIAL NAVIGATION Left  05/12/2017   Procedure: APPLICATION OF CRANIAL NAVIGATION;  Surgeon: Consuella Lose, MD;  Location: [facility];  Service: Neurosurgery;  Laterality: Left;  APPLICATION OF CRANIAL NAVIGATION  . CRANIOTOMY N/A 12/26/2014   Procedure: CRANIOTOMY TUMOR EXCISION with Brain[facility];  Surgeon: Kevan Ny Ditty, MD;  Location: [facility];  Service: Neurosurgery;  Laterality: N/A;  CRANIOTOMY TUMOR EXCISION with [facility]  . CRANIOTOMY Left 05/12/2017   Procedure: STEREOTACTIC LEFT FRONTOPERIETIAL CRANIOTOMY FOR RESECTION OF TUMOR;  Surgeon: Consuella Lose, MD;  Location: [facility];  Service: Neurosurgery;  Laterality: Left;  STEREOTACTIC LEFT FRONTOPERIETIAL CRANIOTOMY FOR RESECTION OF TUMOR  . HERNIA REPAIR    . VASECTOMY    . VIDEO ASSISTED THORACOSCOPY (VATS)/WEDGE RESECTION Left 01/14/2015   Procedure: VIDEO ASSISTED THORACOSCOPY (VATS)/WEDGE RESECTION, Superior segmentectomy left  lower lobe, wedge resection of left upper lobe, multiple lymph node disection, On Q insertion.;  Surgeon: Grace Isaac, MD;  Location: [facility];  Service: Thoracic;  Laterality: Left;  Marland Kitchen VIDEO BRONCHOSCOPY Bilateral 12/16/2014   Procedure: VIDEO BRONCHOSCOPY WITH FLUORO;  Surgeon: Collene Gobble, MD;  Location: [facility];  Service: Cardiopulmonary;  Laterality: Bilateral;  . VIDEO BRONCHOSCOPY N/A 01/14/2015   Procedure: VIDEO BRONCHOSCOPY;  Surgeon: Grace Isaac, MD;  Location: [facility];  Service: Thoracic;  Laterality: N/A;    REVIEW OF SYSTEMS:  A comprehensive review of systems was negative except for: Constitutional: positive for fatigue Musculoskeletal: positive for muscle weakness   PHYSICAL EXAMINATION: General appearance: alert, cooperative, fatigued and no distress Head: Normocephalic, without obvious abnormality, atraumatic Neck: no adenopathy, no JVD, supple, symmetrical, trachea midline and thyroid not enlarged, symmetric, no tenderness/mass/nodules Lymph nodes: Cervical, supraclavicular, and axillary nodes  normal. Resp: clear to auscultation bilaterally Back: symmetric, no curvature. ROM normal. No CVA tenderness. Cardio: regular rate and rhythm, S1, S2 normal, no murmur, click, rub or gallop GI: soft, non-tender; bowel sounds normal; no masses,  no organomegaly Extremities: extremities normal, atraumatic, no cyanosis or edema  ECOG PERFORMANCE STATUS: 1 - Symptomatic but completely ambulatory  Blood pressure (!) 129/96, pulse 81, temperature 98.8 F (37.1 C), temperature source Oral, resp. rate (!) 22, height 6' (1.829 m), weight 182 lb 3.2 oz (82.6 kg), SpO2 98 %.  LABORATORY DATA: Lab Results  Component Value Date   WBC 6.9 08/23/2017   HGB 15.7 08/23/2017   HCT 47.5 08/23/2017   MCV 92.6 08/23/2017   PLT 292 08/23/2017      Chemistry      Component Value Date/Time   NA 143 08/23/2017 1336   NA 137 01/12/2017 0846   K 4.5 08/23/2017 1336   K 4.4 01/12/2017 0846   CL 112 (H) 08/23/2017 1336   CO2 19 (L) 08/23/2017 1336   CO2 18 (L) 01/12/2017 0846   BUN 19 08/23/2017 1336   BUN 15.3 01/12/2017 0846   CREATININE 1.22 08/23/2017 1336   CREATININE 1.19 05/04/2017 1134   CREATININE 1.0 01/12/2017 0846      Component Value Date/Time  CALCIUM 8.5 (L) 08/23/2017 1336   CALCIUM 9.0 01/12/2017 0846   ALKPHOS 100 08/23/2017 1336   ALKPHOS 99 01/12/2017 0846   AST 23 08/23/2017 1336   AST 25 05/04/2017 1134   AST 23 01/12/2017 0846   ALT 19 08/23/2017 1336   ALT 30 05/04/2017 1134   ALT 22 01/12/2017 0846   BILITOT 0.2 (L) 08/23/2017 1336   BILITOT 0.4 05/04/2017 1134   BILITOT 0.41 01/12/2017 0846       RADIOGRAPHIC STUDIES: Ct Chest W Contrast  Result Date: 08/31/2017 CLINICAL DATA:  Lung cancer. EXAM: CT CHEST, ABDOMEN, AND PELVIS WITH CONTRAST TECHNIQUE: Multidetector CT imaging of the chest, abdomen and pelvis was performed following the standard protocol during bolus administration of intravenous contrast. CONTRAST:  112m OMNIPAQUE IOHEXOL 300 MG/ML  SOLN  COMPARISON:  05/04/2017. FINDINGS: CT CHEST FINDINGS Cardiovascular: Coronary artery calcification. Heart size normal. No pericardial effusion. Mediastinum/Nodes: No pathologically enlarged mediastinal, hilar or axillary lymph nodes. Esophagus is grossly unremarkable. Lungs/Pleura: Moderate centrilobular emphysema. Scattered basilar pleuroparenchymal scarring. Slightly nodular soft tissue in the medial aspect of the superior segment left lower lobe measures 12 mm, as before. Postoperative changes in the left lower lobe. No pleural fluid. Airway is unremarkable. Musculoskeletal: No worrisome lytic or sclerotic lesions. CT ABDOMEN PELVIS FINDINGS Hepatobiliary: Liver and gallbladder are unremarkable. No biliary ductal dilatation. Pancreas: Negative. Spleen: Negative. Adrenals/Urinary Tract: Adrenal glands are unremarkable. Subcentimeter low-attenuation lesion in the upper pole right kidney is too small to characterize. Kidneys are otherwise unremarkable. Ureters are decompressed. Bladder is grossly unremarkable. Stomach/Bowel: Stomach, small bowel, appendix and colon are unremarkable. Vascular/Lymphatic: Atherosclerotic calcification of the arterial vasculature without abdominal aortic aneurysm. No pathologically enlarged lymph nodes. Reproductive: Prostate may be minimally enlarged. Other: No free fluid. Small left inguinal hernia contains fat. Mesenteries and peritoneum are unremarkable. Musculoskeletal: Degenerative changes at L5-S1 with loss of disc space height. No worrisome lytic or sclerotic lesions. IMPRESSION: 1. Postoperative changes of left lower lobe wedge resection with stable nodular soft tissue in the medial aspect of the superior segment left lower lobe. No evidence of metastatic disease. 2.  Emphysema (ICD10-J43.9). 3.  Aortic atherosclerosis (ICD10-170.0). 4. Mild prostate enlargement. Electronically Signed   By: MLorin PicketM.D.   On: 08/31/2017 15:08   Mr BJeri CosWLZContrast  Result Date:  08/18/2017 CLINICAL DATA:  Lung cancer diagnosed 12/2014 with left frontal lobe metastasis treated with radiation and surgery. Two year 5 month restaging. EXAM: MRI HEAD WITHOUT AND WITH CONTRAST TECHNIQUE: Multiplanar, multiecho pulse sequences of the brain and surrounding structures were obtained without and with intravenous contrast. CONTRAST:  137mMULTIHANCE GADOBENATE DIMEGLUMINE 529 MG/ML IV SOLN COMPARISON:  Brain MRI 05/13/2017 and 04/18/2017 FINDINGS: BRAIN New Lesions: None. Larger lesions: Treated enhancing lesion located in the posterior right frontal lobe now measures up to 18 mm, with increased surrounding hyperintense T2-weighted signal. There is minimal appreciable contrast enhancement at this resection site previously. Stable or Smaller lesions: None. Other Brain findings: The midline structures are normal. There is no acute infarct or acute hemorrhage. The brain parenchymal signal is normal for the patient's age. Advanced atrophy for age. No chronic microhemorrhage or superficial siderosis. Vascular: Major intracranial arterial and venous sinus flow voids are preserved. Skull and upper cervical spine: Left frontal parietal craniotomy. Sinuses/orbits: No fluid levels or advanced mucosal thickening. No mastoid or middle ear effusion. Normal orbits. IMPRESSION: 1. Residual or recurrent contrast enhancing tumor at the left frontal lobe resection site, markedly worsened from the  prior study, with increased surrounding edema. 2. No new metastatic lesions. 3. Advanced atrophy for age. Electronically Signed   By: Ulyses Jarred M.D.   On: 08/18/2017 23:02   Ct Abdomen Pelvis W Contrast  Result Date: 08/31/2017 CLINICAL DATA:  Lung cancer. EXAM: CT CHEST, ABDOMEN, AND PELVIS WITH CONTRAST TECHNIQUE: Multidetector CT imaging of the chest, abdomen and pelvis was performed following the standard protocol during bolus administration of intravenous contrast. CONTRAST:  14m OMNIPAQUE IOHEXOL 300 MG/ML  SOLN  COMPARISON:  05/04/2017. FINDINGS: CT CHEST FINDINGS Cardiovascular: Coronary artery calcification. Heart size normal. No pericardial effusion. Mediastinum/Nodes: No pathologically enlarged mediastinal, hilar or axillary lymph nodes. Esophagus is grossly unremarkable. Lungs/Pleura: Moderate centrilobular emphysema. Scattered basilar pleuroparenchymal scarring. Slightly nodular soft tissue in the medial aspect of the superior segment left lower lobe measures 12 mm, as before. Postoperative changes in the left lower lobe. No pleural fluid. Airway is unremarkable. Musculoskeletal: No worrisome lytic or sclerotic lesions. CT ABDOMEN PELVIS FINDINGS Hepatobiliary: Liver and gallbladder are unremarkable. No biliary ductal dilatation. Pancreas: Negative. Spleen: Negative. Adrenals/Urinary Tract: Adrenal glands are unremarkable. Subcentimeter low-attenuation lesion in the upper pole right kidney is too small to characterize. Kidneys are otherwise unremarkable. Ureters are decompressed. Bladder is grossly unremarkable. Stomach/Bowel: Stomach, small bowel, appendix and colon are unremarkable. Vascular/Lymphatic: Atherosclerotic calcification of the arterial vasculature without abdominal aortic aneurysm. No pathologically enlarged lymph nodes. Reproductive: Prostate may be minimally enlarged. Other: No free fluid. Small left inguinal hernia contains fat. Mesenteries and peritoneum are unremarkable. Musculoskeletal: Degenerative changes at L5-S1 with loss of disc space height. No worrisome lytic or sclerotic lesions. IMPRESSION: 1. Postoperative changes of left lower lobe wedge resection with stable nodular soft tissue in the medial aspect of the superior segment left lower lobe. No evidence of metastatic disease. 2.  Emphysema (ICD10-J43.9). 3.  Aortic atherosclerosis (ICD10-170.0). 4. Mild prostate enlargement. Electronically Signed   By: MLorin PicketM.D.   On: 08/31/2017 15:08    ASSESSMENT AND PLAN:  This is a very  pleasant 57years old white male with metastatic non-small cell lung cancer, adenocarcinoma with positive PDL 1 expression of 100%. He completed treatment with Ketruda (pembrolizumab) 200 mg IV every 3 weeks, status post 35 cycles, a total of 2 years. The patient is currently on observation and he is feeling fine. He had repeat CT scan of the chest, abdomen and pelvis performed recently.  I personally and independently reviewed the scans and discussed the results with the patient and his ex-wife. His scan showed no concerning findings for disease progression. I recommended for the patient to continue on observation with repeat CT scan of the chest, abdomen and pelvis in 3 months. For the lower extremity weakness and tumor necrosis, he is followed by Dr. VMickeal Skinner The patient was advised to call immediately if he has any concerning symptoms in the interval. The patient voices understanding of current disease status and treatment options and is in agreement with the current care plan. All questions were answered. The patient knows to call the clinic with any problems, questions or concerns. We can certainly see the patient much sooner if necessary.   Disclaimer: This note was dictated with voice recognition software. Similar sounding words can inadvertently be transcribed and may not be corrected upon review.

## 2017-09-07 ENCOUNTER — Ambulatory Visit: Payer: PRIVATE HEALTH INSURANCE | Admitting: Physical Therapy

## 2017-09-08 ENCOUNTER — Telehealth: Payer: Self-pay

## 2017-09-08 ENCOUNTER — Other Ambulatory Visit: Payer: Self-pay | Admitting: Internal Medicine

## 2017-09-08 MED ORDER — DEXAMETHASONE 2 MG PO TABS: 2.0000 mg | ORAL_TABLET | Freq: Every day | ORAL | 3 refills | Status: DC

## 2017-09-08 NOTE — Telephone Encounter (Signed)
Patient called and stated that he has been off the Decadron x 1 week and he has been having headaches daily. He would like to know if he should just continue to monitor or start taking 4 mg Decadron daily again. Spoke with Dr. Mickeal Skinner and he sent a script for Decadron 4 mg daily to patient pharmacy. Updated patient and he stated that he will start taking the 2 mg daily today. He had no other questions or concerns and understands to call back if headaches do not improve.

## 2017-09-14 ENCOUNTER — Ambulatory Visit: Payer: Medicare Other | Attending: Internal Medicine | Admitting: Physical Therapy

## 2017-09-14 ENCOUNTER — Encounter: Payer: Self-pay | Admitting: Physical Therapy

## 2017-09-14 DIAGNOSIS — M6281 Muscle weakness (generalized): Secondary | ICD-10-CM

## 2017-09-14 DIAGNOSIS — R2681 Unsteadiness on feet: Secondary | ICD-10-CM | POA: Insufficient documentation

## 2017-09-14 DIAGNOSIS — R2689 Other abnormalities of gait and mobility: Secondary | ICD-10-CM | POA: Insufficient documentation

## 2017-09-14 DIAGNOSIS — R29818 Other symptoms and signs involving the nervous system: Secondary | ICD-10-CM | POA: Diagnosis not present

## 2017-09-14 NOTE — Patient Instructions (Signed)
  Forward Weight Shift    Standing at counter, left hand on counter and left foot forward. Practice knee then hip flexion as advancing right leg (think raise heel to bend knee first and then flex hip to swing leg forward), then shift onto right leg and extend right hip and knee to stand tall. Then step right leg back to start position.

## 2017-09-15 NOTE — Therapy (Signed)
[facility] [address] [place], [place], [address] Phone: [phone]   Fax:  [phone]  Physical Therapy Treatment and Discharge Summary  Patient Details  Name: UDELL MAZZOCCO MRN: [id number] Date of Birth: 08-13-1960 Referring Provider: Ventura Sellers, MD   Encounter Date: 09/14/2017  PT End of Session - 09/15/17 0515    Visit Number  9    Number of Visits  17    Date for PT Re-Evaluation  09/02/17    Authorization Type  Medicare A/B     Authorization Time Period  07/04/17 to 10/02/17    PT Start Time  1451    PT Stop Time  1530    PT Time Calculation (min)  39 min    Activity Tolerance  Patient tolerated treatment well    Behavior During Therapy  [facility] for tasks assessed/performed       Past Medical History:  Diagnosis Date  . Anxiety   . Bronchitis   . Chronic fatigue 09/17/2015  . Depression 02/11/2016  . Hypertension 12/31/2015   states never been told  . 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 Pneumonia   . Seizures ([place])    last 12/11/14  . Shortness of breath dyspnea    with exertion  . Tobacco abuse     Past Surgical History:  Procedure Laterality Date  . APPLICATION OF CRANIAL NAVIGATION N/A 12/26/2014   Procedure: APPLICATION OF CRANIAL NAVIGATION;  Surgeon: Kevan Ny Ditty, MD;  Location: [facility];  Service: Neurosurgery;  Laterality: N/A;  . APPLICATION OF CRANIAL NAVIGATION Left 05/12/2017   Procedure: APPLICATION OF CRANIAL NAVIGATION;  Surgeon: Consuella Lose, MD;  Location: [facility];  Service: Neurosurgery;  Laterality: Left;  APPLICATION OF CRANIAL NAVIGATION  . CRANIOTOMY N/A 12/26/2014   Procedure: CRANIOTOMY TUMOR EXCISION with BrainLab;  Surgeon: Kevan Ny Ditty, MD;  Location: [facility];  Service: Neurosurgery;  Laterality: N/A;  CRANIOTOMY TUMOR EXCISION with Stealth  . CRANIOTOMY Left 05/12/2017   Procedure: STEREOTACTIC LEFT FRONTOPERIETIAL  CRANIOTOMY FOR RESECTION OF TUMOR;  Surgeon: Consuella Lose, MD;  Location: [facility];  Service: Neurosurgery;  Laterality: Left;  STEREOTACTIC LEFT FRONTOPERIETIAL CRANIOTOMY FOR RESECTION OF TUMOR  . HERNIA REPAIR    . VASECTOMY    . VIDEO ASSISTED THORACOSCOPY (VATS)/WEDGE RESECTION Left 01/14/2015   Procedure: VIDEO ASSISTED THORACOSCOPY (VATS)/WEDGE RESECTION, Superior segmentectomy left  lower lobe, wedge resection of left upper lobe, multiple lymph node disection, On Q insertion.;  Surgeon: Grace Isaac, MD;  Location: [facility];  Service: Thoracic;  Laterality: Left;  Marland Kitchen VIDEO BRONCHOSCOPY Bilateral 12/16/2014   Procedure: VIDEO BRONCHOSCOPY WITH FLUORO;  Surgeon: Collene Gobble, MD;  Location: [facility];  Service: Cardiopulmonary;  Laterality: Bilateral;  . VIDEO BRONCHOSCOPY N/A 01/14/2015   Procedure: VIDEO BRONCHOSCOPY;  Surgeon: Grace Isaac, MD;  Location: [facility];  Service: Thoracic;  Laterality: N/A;    There were no vitals filed for this visit.  Subjective Assessment - 09/14/17 1455    Subjective  Received his [place]ur foot up brace. Only wearing it occasionally. Just today got a call/message from [employer] requesting he schedule a time to come in for impression to be made. He is frustrated that [employer] never got back to him with an approximate cost prior to moving forward. Caught his toe yesterday and fell into lower kitchen cabinet. He hit it hard enough to break it. He was not wearing  Ossur brace.     Pertinent History  December 2016:  initial c/o seizure, characterized as "right sided shaking"; this led to an MRI which demonstrated an enhancing mass consistent with metastatic adenocarcinoma. (treated with resection and chemo) MRI in July 2018 demonstrated possible progression in brain; 05/13/15 lt frontal craniotomy for resection tumor and found radiation necrosis (not return of tumor/cancer)    Limitations  Walking    How long can you walk comfortably?  grocery store is often too  much walking    Patient Stated Goals  Pt's goals for therapy to get RLE stronger and get rid of the weakness; avoid falling    Currently in Pain?  No/denies                       [facility] Adult PT Treatment/Exercise - 09/14/17 1508      Ambulation/Gait   Ambulation/Gait Assistance  4: Min guard    Ambulation/Gait Assistance Details  with his [place] brace; continues to drag/catch toe at least 50% of his steps    Ambulation Distance (Feet)  400 Feet    Assistive device  Other (Comment);Rolling walker   Ossur foot up brace; with vs without RW   Gait Pattern  Decreased arm swing - right;Decreased step length - right;Decreased stance time - right;Decreased step length - left;Decreased dorsiflexion - right;Decreased weight shift to right;Right foot flat;Right flexed knee in stance;Lateral trunk lean to left;Poor foot clearance - right;Right circumduction;Step-through pattern    Ambulation Surface  Level;Indoor    Gait velocity  32.8/14.63=2.24 ft/sec    Pre-Gait Activities  at counter, in stride with LLE forward practicing wt-shift onto LLE, release/bend RLE, bend rt knee (lift heel towards buttock) and then flex hip to advance RLE forward without toe catching; ~100 reps with transition to gait with RW (to allow pt to focus on gait mechanics with lesser risk of falling with RW)             PT Education - 09/14/17 1700    Education Details  provided handout for pre-gait exercise instructed in at last visit; results of testing; recommend he wears foot up brace at all times when walking; recommend he follow-up with [employer] and potential advantage of custom AFO (decr rt toe catching and decr fall risk)    Person(s) Educated  Patient    Methods  Explanation;Demonstration;Verbal cues;Handout    Comprehension  Verbalized understanding;Returned demonstration;Verbal cues required       PT Short Term Goals - 08/01/17 1223      PT SHORT TERM GOAL #1   Title  Patient will be  independent with basic HEP for strengthening and balance. TARGET for all STGs 08/03/2017    Time  4    Period  Weeks    Status  Achieved      PT SHORT TERM GOAL #2   Title  Patient will improve gait velocity to >1.81 ft/sec with appropriate AD and/or brace to demonstrate lesser fall risk with gait.     Baseline  7/23 no device 1.46 ft/sec; with RW 1.37 ft/sec (today only 2nd time pt has used RW); ongoing With LTG updated     Time  4    Period  Weeks    Status  On-going      PT SHORT TERM GOAL #3   Title  Patient will state fall prevention measures he has instituted in his home after fall prevention education.     Baseline  7/23 education  provided this date    Time  4    Period  Weeks    Status  On-going      PT SHORT TERM GOAL #4   Title  Patient will demonstrate modified independent floor to stand transfer with support of furniture.     Time  4    Period  Weeks    Status  Achieved        PT Long Term Goals - 09/15/17 0517      PT LONG TERM GOAL #1   Title  Pt will be independent with updated HEP for improved strength, balance, and gait and able to verbalize his plan for continued community-based exercise program upon completing PT. TARGET for all LTGs 09/02/2017    Baseline  9/5 pt is now facing large car repair payment and is not planning on joining gym as previously planned; plans to continue walking (including some with RW to allow focus on gait mechanics) and continue HEP    Time  8    Period  Weeks    Status  Achieved      PT LONG TERM GOAL #2   Title  Patient will ambulate modified indepent 500 ft on level, indoor surface with appropriate AD +/- RLE brace.    Baseline  9/5 remains minguard due to toe drag and high fall risk    Time  8    Period  Weeks    Status  Not Met      PT LONG TERM GOAL #3   Title  Patient will improved gait velocity to >=1.81 ft/sec with appropriate AD to demonstrate improved safety when walking in the community. (7/23 modified based on  progress at STGs)    Baseline  9/5 2.24 ft/sec     Time  8    Period  Weeks    Status  Achieved            Plan - 09/15/17 0520    Clinical Impression Statement  Patient returns for final visit after having to cancel previous appointment due to flat tire on his way to clinic. (Note: appt is covered by current certification as this was the 8th week he had an appt--there were 2 weeks during course of PT when he had no therapy appts). LTGs assessed with pt meeting 2 of 3. Third goal not met due to continued RLE weakness and pt refusing to follow-through on having a custom AFO made. Patient agrees with discharge at this time as he is independent with HEP and does not plan to move forward with AFO.     Rehab Potential  Good    Clinical Impairments Affecting Rehab Potential  Hx of cancer/mets required second craniotomy with incr weakness    PT Frequency  2x / week    PT Duration  8 weeks    PT Treatment/Interventions  ADLs/Self Care Home Management;Aquatic Therapy;Gait training;DME Instruction;Stair training;Functional mobility training;Therapeutic activities;Therapeutic exercise;Balance training;Neuromuscular re-education;Cognitive remediation;Manual techniques;Orthotic Fit/Training;Patient/family education;Passive range of motion;Energy conservation    Consulted and Agree with Plan of Care  Patient       Patient will benefit from skilled therapeutic intervention in order to improve the following deficits and impairments:  Abnormal gait, Decreased activity tolerance, Decreased balance, Decreased cognition, Decreased endurance, Decreased knowledge of use of DME, Decreased mobility, Decreased strength, Decreased safety awareness, Decreased range of motion, Impaired perceived functional ability, Impaired tone, Impaired UE functional use  Visit Diagnosis: Muscle weakness (generalized)  Other symptoms and signs involving the  nervous system  Other abnormalities of gait and  mobility  Unsteadiness on feet     Problem List Patient Active Problem List   Diagnosis Date Noted  . Brain tumor ([place]) 05/12/2017  . Right leg weakness 01/12/2017  . Depression 02/11/2016  . Hypertension 12/31/2015  . Chronic fatigue 09/17/2015  . Shoulder pain, left 08/06/2015  . Encounter for antineoplastic immunotherapy 03/12/2015  . Malnutrition of moderate degree 01/17/2015  . S/P craniotomy 12/26/2014  . Brain metastasis ([place]) 12/26/2014  . Primary cancer of left lower lobe of lung ([place]) 12/22/2014  . Right sided weakness 12/12/2014  . Tobacco abuse    PHYSICAL THERAPY DISCHARGE SUMMARY  Visits from Start of Care: 9  Current functional level related to goals / functional outcomes: See LTGs above   Remaining deficits: RLE weakness with foot drop and catching rt foot during swing with high fall risk   Education / Equipment: HEP, benefits of custom AFO (he is refusing) to reduce fall risk  Plan: Patient agrees to discharge.  Patient goals were partially met. Patient is being discharged due to meeting the stated rehab goals.  ?????        Rexanne Mano, PT 09/15/2017, 5:28 AM  [facility] [address] [place], [place], [address] Phone: [phone]   Fax:  [phone]  Name: MESSIAH AHR MRN: [id number] Date of Birth: Apr 08, 1960

## 2017-09-19 ENCOUNTER — Telehealth: Payer: Self-pay | Admitting: *Deleted

## 2017-09-19 ENCOUNTER — Other Ambulatory Visit: Payer: Self-pay | Admitting: Internal Medicine

## 2017-09-19 MED ORDER — DEXAMETHASONE 1 MG PO TABS: 1.0000 mg | ORAL_TABLET | Freq: Every evening | ORAL | 3 refills | Status: DC

## 2017-09-19 NOTE — Telephone Encounter (Signed)
Patient called to advise that he was told by Dr Mickeal Skinner to take Decadron 2 mg for his reoccuring headaches and see if it helped.  He states they did help some but not completely.  He proceeded to add 1 mg of Decadron to his daily dose (2mg  in the am and 1 mg in the PM) and with this increased dose the headaches are resolved.  He states he has only been taking the increased total 3 mg dose since Sunday and he wanted to know if he could continue with that dose.    Per Dr Mickeal Skinner ok to continue with 2 mg in the am and 1 mg in the PM of Decadron for headache management.  [place] ordered 1 mg to pharmacy of choice.  Patient is aware and has no further questions.

## 2017-09-24 ENCOUNTER — Encounter

## 2017-09-25 ENCOUNTER — Ambulatory Visit: Payer: Medicare Other | Admitting: Physical Therapy

## 2017-10-13 DIAGNOSIS — Z23 Encounter for immunization: Secondary | ICD-10-CM | POA: Diagnosis not present

## 2017-11-15 ENCOUNTER — Other Ambulatory Visit: Payer: Self-pay | Admitting: Radiation Therapy

## 2017-11-17 ENCOUNTER — Ambulatory Visit
Admission: RE | Admit: 2017-11-17 | Discharge: 2017-11-17 | Disposition: A | Discharge status: Home / Self Care | Payer: Medicare Other | Source: Ambulatory Visit | Attending: Internal Medicine | Admitting: Internal Medicine

## 2017-11-17 DIAGNOSIS — G9389 Other specified disorders of brain: Secondary | ICD-10-CM | POA: Diagnosis not present

## 2017-11-17 DIAGNOSIS — C7931 Secondary malignant neoplasm of brain: Secondary | ICD-10-CM

## 2017-11-17 MED ORDER — GADOBENATE DIMEGLUMINE 529 MG/ML IV SOLN
17.0000 mL | Freq: Once | INTRAVENOUS | Status: AC | PRN
  Administered 2017-11-17: 17 mL via INTRAVENOUS

## 2017-11-20 ENCOUNTER — Other Ambulatory Visit: Payer: Medicare Other

## 2017-11-22 ENCOUNTER — Telehealth: Payer: Self-pay | Admitting: Internal Medicine

## 2017-11-22 ENCOUNTER — Inpatient Hospital Stay: Payer: Medicare Other | Attending: Internal Medicine | Admitting: Internal Medicine

## 2017-11-22 VITALS — BP 144/74 | HR 74 | Temp 97.7°F | Resp 18 | Ht 72.0 in | Wt 188.5 lb

## 2017-11-22 DIAGNOSIS — Z87891 Personal history of nicotine dependence: Secondary | ICD-10-CM | POA: Insufficient documentation

## 2017-11-22 DIAGNOSIS — C7931 Secondary malignant neoplasm of brain: Secondary | ICD-10-CM | POA: Diagnosis not present

## 2017-11-22 DIAGNOSIS — R531 Weakness: Secondary | ICD-10-CM | POA: Diagnosis not present

## 2017-11-22 DIAGNOSIS — G4700 Insomnia, unspecified: Secondary | ICD-10-CM

## 2017-11-22 DIAGNOSIS — C3432 Malignant neoplasm of lower lobe, left bronchus or lung: Secondary | ICD-10-CM | POA: Insufficient documentation

## 2017-11-22 DIAGNOSIS — R29898 Other symptoms and signs involving the musculoskeletal system: Secondary | ICD-10-CM

## 2017-11-22 MED ORDER — MIRTAZAPINE 30 MG PO TABS: 30.0000 mg | ORAL_TABLET | Freq: Every day | ORAL | 0 refills | Status: DC

## 2017-11-22 NOTE — Telephone Encounter (Signed)
Gave avs and calendar ° °

## 2017-11-22 NOTE — Progress Notes (Signed)
[facility] [place], [place] [address] [phone]   Interval Patient Evaluation  Date of Service: 11/22/17 Patient Name: Timothy Obrien Patient MRN: [id number] Patient DOB: June 30, 1960 Provider: Ventura Sellers, MD  Identifying Statement:  Timothy Obrien is a 57 y.o. male with Brain metastasis ([place]) [C79.31]   Primary Cancer: NSCLC Stage IV  Oncologic History: 12/25/14: MRI demonstrates left frontal mestastasis, SRS is performed followed by resection. 12/23/16: Patient describes right leg weakness, some progression noted on MRI 05/12/17: Resection of left frontal progressive lesion.  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  Interval History:  Timothy Obrien presents today for follow up after recent MRI brain.  He describes initial improvement of right leg weakness after starting steroids, though recently he feels "weak again".  He even notices some weakness in his shoulders (both).  He may be having a difficult time getting up stairs compared to prior.  Taking decadron 41m AM, 160mPM.  Denies seizures.  Medications: Current Outpatient Medications on File Prior to Visit  Medication Sig Dispense Refill  . acetaminophen-codeine (TYLENOL #3) 300-30 MG tablet Take 1 tablet by mouth every 6 (six) hours as needed for moderate pain. (Patient not taking: Reported on 05/23/2017) 60 tablet 0  . ALPRAZolam (XANAX) 0.25 MG tablet Take 0.25 mg by mouth daily as needed for anxiety.     . Marland Kitchenexamethasone (DECADRON) 1 MG tablet Take 1 tablet (1 mg total) by mouth every evening. 30 tablet 3  . dexamethasone (DECADRON) 2 MG tablet Take 1 tablet (2 mg total) by mouth daily. 30 tablet 3  . levETIRAcetam (KEPPRA) 500 MG tablet Take 1 tablet (500 mg total) by mouth every 12 (twelve) hours. 60 tablet 5  . mirtazapine (REMERON) 30 MG tablet Take 1 tablet (30 mg total) by mouth at bedtime. 90 tablet 0   No current facility-administered medications on file prior  to visit.     Allergies: No Known Allergies Past Medical History:  Past Medical History:  Diagnosis Date  . Anxiety   . Bronchitis   . Chronic fatigue 09/17/2015  . Depression 02/11/2016  . Hypertension 12/31/2015   states never been told  . 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 Pneumonia   . Seizures ([place]   last 12/11/14  . Shortness of breath dyspnea    with exertion  . Tobacco abuse    Past Surgical History:  Past Surgical History:  Procedure Laterality Date  . APPLICATION OF CRANIAL NAVIGATION N/A 12/26/2014   Procedure: APPLICATION OF CRANIAL NAVIGATION;  Surgeon: BeKevan Nyitty, MD;  [facility];  Service: Neurosurgery;  Laterality: N/A;  . APPLICATION OF CRANIAL NAVIGATION Left 05/12/2017   Procedure: APPLICATION OF CRANIAL NAVIGATION;  Surgeon: NuConsuella LoseMD;  [facility] Service: Neurosurgery;  Laterality: Left;  APPLICATION OF CRANIAL NAVIGATION  . CRANIOTOMY N/A 12/26/2014   Procedure: CRANIOTOMY TUMOR EXCISION with BrainLab;  Surgeon: BeKevan Nyitty, MD;  [facility];  Service: Neurosurgery;  Laterality: N/A;  CRANIOTOMY TUMOR EXCISION with Stealth  . CRANIOTOMY Left 05/12/2017   Procedure: STEREOTACTIC LEFT FRONTOPERIETIAL CRANIOTOMY FOR RESECTION OF TUMOR;  Surgeon: NuConsuella LoseMD;  [facility] Service: Neurosurgery;  Laterality: Left;  STEREOTACTIC LEFT FRONTOPERIETIAL CRANIOTOMY FOR RESECTION OF TUMOR  . HERNIA REPAIR    . VASECTOMY    . VIDEO ASSISTED THORACOSCOPY (VATS)/WEDGE RESECTION Left 01/14/2015  Procedure: VIDEO ASSISTED THORACOSCOPY (VATS)/WEDGE RESECTION, Superior segmentectomy left  lower lobe, wedge resection of left upper lobe, multiple lymph node disection, On Q insertion.;  Surgeon: Grace Isaac, MD;  Location: [facility];  Service: Thoracic;  Laterality: Left;  Marland Kitchen VIDEO BRONCHOSCOPY Bilateral 12/16/2014   Procedure: VIDEO BRONCHOSCOPY WITH FLUORO;  Surgeon: Collene Gobble, MD;  Location: [facility];  Service: Cardiopulmonary;  Laterality: Bilateral;  . VIDEO BRONCHOSCOPY N/A 01/14/2015   Procedure: VIDEO BRONCHOSCOPY;  Surgeon: Grace Isaac, MD;  Location: [facility];  Service: Thoracic;  Laterality: N/A;   Social History:  Social History   Socioeconomic History  . Marital status: Divorced    Spouse name: Not on file  . Number of children: Not on file  . Years of education: Not on file  . Highest education level: Not on file  Occupational History  . Not on file  Social Needs  . Financial resource strain: Not on file  . Food insecurity:    Worry: Not on file    Inability: Not on file  . Transportation needs:    Medical: Not on file    Non-medical: Not on file  Tobacco Use  . Smoking status: Former Smoker    Packs/day: 1.00    Years: 36.00    Pack years: 36.00    Types: Cigarettes    Start date: 12/14/1979    Last attempt to quit: 10/07/2015    Years since quitting: 2.1  . Smokeless tobacco: Current User  . Tobacco comment: Vapes: Few cigars a day. Quit cigarettes after easter.  Substance and Sexual Activity  . Alcohol use: Yes    Alcohol/week: 21.0 standard drinks    Types: 21 Cans of beer per week    Comment: 3 beers a day   . Drug use: Yes    Types: Marijuana    Comment: last time 01/04/15  . Sexual activity: Not Currently  Lifestyle  . Physical activity:    Days per week: Not on file    Minutes per session: Not on file  . Stress: Not on file  Relationships  . Social connections:    Talks on phone: Not on file    Gets together: Not on file    Attends religious service: Not on file    Active member of club or organization: Not on file    Attends meetings of clubs or organizations: Not on file    Relationship status: Not on file  . Intimate partner violence:    Fear of current or ex partner: Not on file    Emotionally abused: Not on file    Physically abused: Not on file    Forced sexual activity: Not on file  Other  Topics Concern  . 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 He does have a cat but no other home pets. No mold exposure. Recent travel to [place] but with symptoms at the onset of travel.   Family History:  Family History  Problem Relation Age of Onset  . Breast cancer Mother   . Colon polyps Mother   . Arthritis Father     Review of Systems: Constitutional: Denies fevers, chills or abnormal weight loss Eyes: Denies blurriness of vision Ears, nose, mouth, throat, and face: Denies mucositis or sore throat Respiratory: Denies cough, dyspnea or wheezes Cardiovascular: Denies palpitation, chest discomfort or lower extremity swelling Gastrointestinal:  Denies nausea, constipation, diarrhea GU:  Denies dysuria or incontinence Skin: Denies abnormal skin rashes Neurological: Per HPI Musculoskeletal: Denies joint pain, back or neck discomfort. No decrease in ROM Behavioral/Psych: Denies anxiety, disturbance in thought content, and mood instability   Physical Exam: Vitals:   11/22/17 1244  BP: (!) 144/74  Pulse: 74  Resp: 18  Temp: 97.7 F (36.5 C)  SpO2: 98%   KPS: 80. General: Alert, cooperative, pleasant, in no acute distress Head: Normal EENT: No conjunctival injection or scleral icterus. Oral mucosa moist Lungs: Resp effort normal Cardiac: Regular rate and rhythm Abdomen: Soft, non-distended abdomen Skin: No rashes cyanosis or petechiae. Extremities: No clubbing or edema  Neurologic Exam: Mental Status: Awake, alert, attentive to examiner. Oriented to self and environment. Language is fluent with intact comprehension.  Cranial Nerves: Visual acuity is grossly normal. Visual fields are full. Extra-ocular movements intact. No ptosis. Face is symmetric, tongue midline. Motor: Tone and bulk are normal. Pronator drift in right, he is 4/5 in right leg. Reflexes are increased on left with mild  spasticity and 10 beats right ankle clonus.  Sensory: Intact to light touch and temperature Gait: Hemiparetic gait  Labs: I have reviewed the data as listed    Component Value Date/Time   NA 143 08/23/2017 1336   NA 137 01/12/2017 0846   K 4.5 08/23/2017 1336   K 4.4 01/12/2017 0846   CL 112 (H) 08/23/2017 1336   CO2 19 (L) 08/23/2017 1336   CO2 18 (L) 01/12/2017 0846   GLUCOSE 84 08/23/2017 1336   GLUCOSE 91 01/12/2017 0846   BUN 19 08/23/2017 1336   BUN 15.3 01/12/2017 0846   CREATININE 1.22 08/23/2017 1336   CREATININE 1.19 05/04/2017 1134   CREATININE 1.0 01/12/2017 0846   CALCIUM 8.5 (L) 08/23/2017 1336   CALCIUM 9.0 01/12/2017 0846   PROT 6.5 08/23/2017 1336   PROT 7.2 01/12/2017 0846   ALBUMIN 3.5 08/23/2017 1336   ALBUMIN 3.9 01/12/2017 0846   AST 23 08/23/2017 1336   AST 25 05/04/2017 1134   AST 23 01/12/2017 0846   ALT 19 08/23/2017 1336   ALT 30 05/04/2017 1134   ALT 22 01/12/2017 0846   ALKPHOS 100 08/23/2017 1336   ALKPHOS 99 01/12/2017 0846   BILITOT 0.2 (L) 08/23/2017 1336   BILITOT 0.4 05/04/2017 1134   BILITOT 0.41 01/12/2017 0846   GFRNONAA >60 08/23/2017 1336   GFRNONAA >60 05/04/2017 1134   GFRAA >60 08/23/2017 1336   GFRAA >60 05/04/2017 1134   Lab Results  Component Value Date   WBC 6.9 08/23/2017   NEUTROABS 4.5 08/23/2017   HGB 15.7 08/23/2017   HCT 47.5 08/23/2017   MCV 92.6 08/23/2017   PLT 292 08/23/2017    Imaging:  Mr [name] LK Contrast  Result Date: 11/17/2017 CLINICAL DATA:  Metastatic lung cancer. History of craniotomy and stereotactic radio surgery. Repeat resection of enhancing lesion left parietal lobe on 05/12/2017. Pathology revealed no malignancy. EXAM: MRI HEAD WITHOUT AND WITH CONTRAST TECHNIQUE: Multiplanar, multiecho pulse sequences of the brain and surrounding structures were obtained without and with intravenous contrast. CONTRAST:  81m MULTIHANCE GADOBENATE DIMEGLUMINE 529 MG/ML IV SOLN COMPARISON:  MRI head  08/18/2017 FINDINGS: Brain: Enhancing lesion left frontal parietal lobe is slightly smaller on today's study now measuring 15 x 16 mm. Lesion is non masslike now with multiple concave margins. Decreased surrounding white matter edema. No new enhancing mass lesions in the brain. Ventricle size normal. Negative for hemorrhage. Several small white matter lesions compatible with  mild chronic microvascular ischemia. Vascular: Normal arterial flow void Skull and upper cervical spine: Left parietal craniotomy. Sinuses/Orbits: Retention cyst right maxillary sinus.  Normal orbit Other: None IMPRESSION: Enhancing lesion left frontal parietal lobe shows interval improvement in size and appearance. Also decreased surrounding edema. Prior biopsy of the lesion indicated no malignant cells. No new lesions identified. Electronically Signed   By: Franchot Gallo M.D.   On: 11/17/2017 13:34    [facility] Clinician Interpretation: I have personally reviewed the radiological images as listed.  My interpretation, in the context of the patient's clinical presentation, is stable disease   Assessment/Plan 1. Brain metastasis ([place])  2. Right leg weakness  Timothy Obrien is clinically and radiographically stable today.  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  We recommended decreasing decadron to 3m daily for one week, then 189mdaily for one week, then stopping therapy.  We strongly recommended continuation of physical therapy exercises to recover motor function in the right leg.  He should return to clinic in 4 months with an MRI for review, or sooner as needed depending on response to steroids.  We appreciate the opportunity to participate in the care of Timothy Obrien We will maintain open communication regarding future treatment pathways and formal follow up.  All questions were answered. The patient knows to call the clinic with any problems, questions or concerns. No barriers to  learning were detected.  The total time spent in the encounter was 40 minutes and more than 50% was on counseling and review of test results   ZaVentura SellersMD Medical Director of Neuro-Oncology [facility]1/13/19 12:34 PM

## 2017-11-23 ENCOUNTER — Other Ambulatory Visit: Payer: Self-pay | Admitting: *Deleted

## 2017-11-23 DIAGNOSIS — C7931 Secondary malignant neoplasm of brain: Secondary | ICD-10-CM

## 2017-11-23 NOTE — Progress Notes (Signed)
Brain and Spine Tumor Board Documentation  Timothy Obrien was presented by Cecil Cobbs, MD at Brain and Spine Tumor Board on 11/23/2017, which included representatives from neuro oncology, radiation oncology, surgical oncology, navigation, pathology, radiology.  Timothy Obrien was presented as a current patient with history of the following treatments:  .  Additionally, we reviewed previous medical and familial history, history of present illness, and recent lab results along with all available histopathologic and imaging studies. The tumor board considered available treatment options and made the following recommendations:  Active surveillance    Tumor board is a meeting of clinicians from various specialty areas who evaluate and discuss patients for whom a multidisciplinary approach is being considered. Final determinations in the plan of care are those of the provider(s). The responsibility for follow up of recommendations given during tumor board is that of the provider.   Today's extended care, comprehensive team conference, Timothy Obrien was not present for the discussion and was not examined.

## 2017-12-01 ENCOUNTER — Inpatient Hospital Stay: Payer: Medicare Other

## 2017-12-01 ENCOUNTER — Ambulatory Visit (HOSPITAL_COMMUNITY)
Admission: RE | Admit: 2017-12-01 | Discharge: 2017-12-01 | Disposition: A | Discharge status: Home / Self Care | Payer: Medicare Other | Source: Ambulatory Visit | Attending: Internal Medicine | Admitting: Internal Medicine

## 2017-12-01 DIAGNOSIS — C3490 Malignant neoplasm of unspecified part of unspecified bronchus or lung: Secondary | ICD-10-CM | POA: Insufficient documentation

## 2017-12-01 DIAGNOSIS — C7931 Secondary malignant neoplasm of brain: Secondary | ICD-10-CM | POA: Diagnosis not present

## 2017-12-01 DIAGNOSIS — C3432 Malignant neoplasm of lower lobe, left bronchus or lung: Secondary | ICD-10-CM | POA: Diagnosis not present

## 2017-12-01 DIAGNOSIS — Z87891 Personal history of nicotine dependence: Secondary | ICD-10-CM | POA: Diagnosis not present

## 2017-12-01 DIAGNOSIS — R531 Weakness: Secondary | ICD-10-CM | POA: Diagnosis not present

## 2017-12-01 LAB — CMP (CANCER CENTER ONLY)
ALT: 32 U/L (ref 0–44)
AST: 25 U/L (ref 15–41)
Albumin: 3.7 g/dL (ref 3.5–5.0)
Alkaline Phosphatase: 112 U/L (ref 38–126)
Anion gap: 13 (ref 5–15)
BILIRUBIN TOTAL: 0.6 mg/dL (ref 0.3–1.2)
BUN: 13 mg/dL (ref 6–20)
CO2: 17 mmol/L — ABNORMAL LOW (ref 22–32)
Calcium: 9 mg/dL (ref 8.9–10.3)
Chloride: 106 mmol/L (ref 98–111)
Creatinine: 1.05 mg/dL (ref 0.61–1.24)
Glucose, Bld: 114 mg/dL — ABNORMAL HIGH (ref 70–99)
Potassium: 4.3 mmol/L (ref 3.5–5.1)
Sodium: 136 mmol/L (ref 135–145)
TOTAL PROTEIN: 7.1 g/dL (ref 6.5–8.1)

## 2017-12-01 LAB — CBC WITH DIFFERENTIAL (CANCER CENTER ONLY)
ABS IMMATURE GRANULOCYTES: 0.14 10*3/uL — AB (ref 0.00–0.07)
BASOS ABS: 0 10*3/uL (ref 0.0–0.1)
BASOS PCT: 1 %
Eosinophils Absolute: 0 10*3/uL (ref 0.0–0.5)
Eosinophils Relative: 0 %
HCT: 46.6 % (ref 39.0–52.0)
HEMOGLOBIN: 15.6 g/dL (ref 13.0–17.0)
Immature Granulocytes: 2 %
LYMPHS PCT: 13 %
Lymphs Abs: 1.1 10*3/uL (ref 0.7–4.0)
MCH: 31.9 pg (ref 26.0–34.0)
MCHC: 33.5 g/dL (ref 30.0–36.0)
MCV: 95.3 fL (ref 80.0–100.0)
MONO ABS: 0.5 10*3/uL (ref 0.1–1.0)
Monocytes Relative: 6 %
NEUTROS ABS: 6.8 10*3/uL (ref 1.7–7.7)
Neutrophils Relative %: 78 %
PLATELETS: 250 10*3/uL (ref 150–400)
RBC: 4.89 MIL/uL (ref 4.22–5.81)
RDW: 15.1 % (ref 11.5–15.5)
WBC: 8.6 10*3/uL (ref 4.0–10.5)
nRBC: 0 % (ref 0.0–0.2)

## 2017-12-01 MED ORDER — SODIUM CHLORIDE (PF) 0.9 % IJ SOLN
INTRAMUSCULAR | Status: AC
  Filled 2017-12-01: qty 50

## 2017-12-01 MED ORDER — IOHEXOL 300 MG/ML  SOLN
100.0000 mL | Freq: Once | INTRAMUSCULAR | Status: AC | PRN
  Administered 2017-12-01: 100 mL via INTRAVENOUS

## 2017-12-05 ENCOUNTER — Telehealth: Payer: Self-pay | Admitting: Internal Medicine

## 2017-12-05 ENCOUNTER — Encounter: Payer: Self-pay | Admitting: Internal Medicine

## 2017-12-05 ENCOUNTER — Inpatient Hospital Stay (HOSPITAL_BASED_OUTPATIENT_CLINIC_OR_DEPARTMENT_OTHER): Payer: Medicare Other | Admitting: Internal Medicine

## 2017-12-05 VITALS — BP 132/91 | HR 80 | Temp 97.7°F | Resp 20 | Ht 72.0 in | Wt 187.5 lb

## 2017-12-05 DIAGNOSIS — C3432 Malignant neoplasm of lower lobe, left bronchus or lung: Secondary | ICD-10-CM

## 2017-12-05 DIAGNOSIS — C7931 Secondary malignant neoplasm of brain: Secondary | ICD-10-CM

## 2017-12-05 DIAGNOSIS — C3490 Malignant neoplasm of unspecified part of unspecified bronchus or lung: Secondary | ICD-10-CM

## 2017-12-05 DIAGNOSIS — R531 Weakness: Secondary | ICD-10-CM | POA: Diagnosis not present

## 2017-12-05 DIAGNOSIS — D496 Neoplasm of unspecified behavior of brain: Secondary | ICD-10-CM

## 2017-12-05 DIAGNOSIS — Z87891 Personal history of nicotine dependence: Secondary | ICD-10-CM | POA: Diagnosis not present

## 2017-12-05 NOTE — Telephone Encounter (Signed)
Gave pt avs and calendar  °

## 2017-12-05 NOTE — Progress Notes (Signed)
[facility] [phone]   [phone]  OFFICE PROGRESS NOTE  [name], MD [address] [place] [address]  DIAGNOSIS:  Stage IV (T2a, N1, M1b) non-small cell lung cancer, adenocarcinoma, negative EGFR mutation but equivocal EGFR amplification, negative ALK gene translocation and negative ROS 1 but with PDL-1 expression 100% presented with left lower lobe lung mass in addition to left hilar adenopathy and solitary metastatic brain lesion diagnosed in December 2016.  PRIOR THERAPY: 1) status post stereotactic radiotherapy and surgical resection diagnosed in December 2016. 2) status post video bronchoscopy with left VATS and wedge resection of the left upper lobe and left lower lobe superior segmentectomy with lymph node dissection under the care of Dr. Servando Snare on 01/14/2015. 3) First-line treatment with immunotherapy with Ketruda (pembrolizumab) 200 mg IV every 3 weeks status post 35 cycles. First cycle was given 02/18/2015.  Discontinued after the patient completed 2 years of treatment. 4) stereotactic left frontal craniotomy for resection of tumor and the final pathology was consistent with tumor necrosis.  This was done on 05/12/2017  CURRENT THERAPY: Observation.  INTERVAL HISTORY: Timothy Obrien 57 y.o. male returns to the clinic today for follow-up visit accompanied by his ex-wife.  The patient is feeling fine today with no specific complaints except for weakness of the lower extremities.  He is being tapered from Decadron and last dose was earlier today.  He denied having any chest pain, shortness of breath, cough or hemoptysis.  He denied having any fever or chills.  He has no nausea, vomiting, diarrhea or constipation.  He has no headache or visual changes.  He denied having any recent weight loss or night sweats.  The patient had repeat CT scan of the chest, abdomen and pelvis performed recently and he is here for evaluation and  discussion of his discuss results.  MEDICAL HISTORY: Past Medical History:  Diagnosis Date  . Anxiety   . Bronchitis   . Chronic fatigue 09/17/2015  . Depression 02/11/2016  . Hypertension 12/31/2015   states never been told  . Lung cancer ([facility])    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  . Pneumonia   . Seizures ([facility])    last 12/11/14  . Shortness of breath dyspnea    with exertion  . Tobacco abuse     ALLERGIES:  has No Known Allergies.  MEDICATIONS:  Current Outpatient Medications  Medication Sig Dispense Refill  . acetaminophen-codeine (TYLENOL #3) 300-30 MG tablet Take 1 tablet by mouth every 6 (six) hours as needed for moderate pain. (Patient not taking: Reported on 05/23/2017) 60 tablet 0  . ALPRAZolam (XANAX) 0.25 MG tablet Take 0.25 mg by mouth daily as needed for anxiety.     Marland Kitchen dexamethasone (DECADRON) 1 MG tablet Take 1 tablet (1 mg total) by mouth every evening. 30 tablet 3  . dexamethasone (DECADRON) 2 MG tablet Take 1 tablet (2 mg total) by mouth daily. 30 tablet 3  . levETIRAcetam (KEPPRA) 500 MG tablet Take 1 tablet (500 mg total) by mouth every 12 (twelve) hours. 60 tablet 5  . mirtazapine (REMERON) 30 MG tablet Take 1 tablet (30 mg total) by mouth at bedtime. 90 tablet 0   No current facility-administered medications for this visit.     SURGICAL HISTORY:  Past Surgical History:  Procedure Laterality Date  . APPLICATION OF CRANIAL NAVIGATION N/A 12/26/2014   Procedure: APPLICATION OF CRANIAL NAVIGATION;  Surgeon: Kevan Ny  Ditty, MD;  Location: [facility];  Service: Neurosurgery;  Laterality: N/A;  . APPLICATION OF CRANIAL NAVIGATION Left 05/12/2017   Procedure: APPLICATION OF CRANIAL NAVIGATION;  Surgeon: Consuella Lose, MD;  Location: [facility];  Service: Neurosurgery;  Laterality: Left;  APPLICATION OF CRANIAL NAVIGATION  . CRANIOTOMY N/A 12/26/2014   Procedure: CRANIOTOMY TUMOR EXCISION with [facility];  Surgeon: Kevan Ny Ditty, MD;  Location: [facility];  Service: Neurosurgery;  Laterality: N/A;  CRANIOTOMY TUMOR EXCISION with [facility]  . CRANIOTOMY Left 05/12/2017   Procedure: STEREOTACTIC LEFT FRONTOPERIETIAL CRANIOTOMY FOR RESECTION OF TUMOR;  Surgeon: Consuella Lose, MD;  Location: [facility];  Service: Neurosurgery;  Laterality: Left;  STEREOTACTIC LEFT FRONTOPERIETIAL CRANIOTOMY FOR RESECTION OF TUMOR  . HERNIA REPAIR    . VASECTOMY    . VIDEO ASSISTED THORACOSCOPY (VATS)/WEDGE RESECTION Left 01/14/2015   Procedure: VIDEO ASSISTED THORACOSCOPY (VATS)/WEDGE RESECTION, Superior segmentectomy left  lower lobe, wedge resection of left upper lobe, multiple lymph node disection, On Q insertion.;  Surgeon: Grace Isaac, MD;  Location: [facility];  Service: Thoracic;  Laterality: Left;  Marland Kitchen VIDEO BRONCHOSCOPY Bilateral 12/16/2014   Procedure: VIDEO BRONCHOSCOPY WITH FLUORO;  Surgeon: Collene Gobble, MD;  Location: [facility];  Service: Cardiopulmonary;  Laterality: Bilateral;  . VIDEO BRONCHOSCOPY N/A 01/14/2015   Procedure: VIDEO BRONCHOSCOPY;  Surgeon: Grace Isaac, MD;  [facility];  Service: Thoracic;  Laterality: N/A;    REVIEW OF SYSTEMS:  A comprehensive review of systems was negative except for: Constitutional: positive for fatigue Musculoskeletal: positive for muscle weakness   PHYSICAL EXAMINATION: General appearance: alert, cooperative, fatigued and no distress Head: Normocephalic, without obvious abnormality, atraumatic Neck: no adenopathy, no JVD, supple, symmetrical, trachea midline and thyroid not enlarged, symmetric, no tenderness/mass/nodules Lymph nodes: Cervical, supraclavicular, and axillary nodes normal. Resp: clear to auscultation bilaterally Back: symmetric, no curvature. ROM normal. No CVA tenderness. Cardio: regular rate and rhythm, S1, S2 normal, no murmur, click, rub or gallop GI: soft, non-tender; bowel sounds normal; no masses,  no organomegaly Extremities: extremities normal, atraumatic, no cyanosis or  edema  ECOG PERFORMANCE STATUS: 1 - Symptomatic but completely ambulatory  Blood pressure (!) 132/91, pulse 80, temperature 97.7 F (36.5 C), temperature source Oral, resp. rate 20, height 6' (1.829 m), weight 187 lb 8 oz (85 kg), SpO2 96 %.  LABORATORY DATA: Lab Results  Component Value Date   WBC 8.6 12/01/2017   HGB 15.6 12/01/2017   HCT 46.6 12/01/2017   MCV 95.3 12/01/2017   PLT 250 12/01/2017      Chemistry      Component Value Date/Time   NA 136 12/01/2017 1342   NA 137 01/12/2017 0846   K 4.3 12/01/2017 1342   K 4.4 01/12/2017 0846   CL 106 12/01/2017 1342   CO2 17 (L) 12/01/2017 1342   CO2 18 (L) 01/12/2017 0846   BUN 13 12/01/2017 1342   BUN 15.3 01/12/2017 0846   CREATININE 1.05 12/01/2017 1342   CREATININE 1.0 01/12/2017 0846      Component Value Date/Time   CALCIUM 9.0 12/01/2017 1342   CALCIUM 9.0 01/12/2017 0846   ALKPHOS 112 12/01/2017 1342   ALKPHOS 99 01/12/2017 0846   AST 25 12/01/2017 1342   AST 23 01/12/2017 0846   ALT 32 12/01/2017 1342   ALT 22 01/12/2017 0846   BILITOT 0.6 12/01/2017 1342   BILITOT 0.41 01/12/2017 0846       RADIOGRAPHIC STUDIES: Ct Chest W Contrast  Result Date:  12/01/2017 CLINICAL DATA:  Metastatic non-small-cell lung cancer. Adenocarcinoma. Prior immunotherapy. Currently on observation. EXAM: CT CHEST, ABDOMEN, AND PELVIS WITH CONTRAST TECHNIQUE: Multidetector CT imaging of the chest, abdomen and pelvis was performed following the standard protocol during bolus administration of intravenous contrast. CONTRAST:  129m OMNIPAQUE IOHEXOL 300 MG/ML  SOLN COMPARISON:  08/31/2017 and the brain MR of 11/17/2017. FINDINGS: CT CHEST FINDINGS Cardiovascular: Aortic atherosclerosis. Normal heart size, without pericardial effusion. Multivessel coronary artery atherosclerosis. No central pulmonary embolism, on this non-dedicated study. Mediastinum/Nodes: No supraclavicular adenopathy. No mediastinal or hilar adenopathy. Lungs/Pleura:  No pleural fluid.  Moderate bullous type emphysema. Surgical sutures in the posterior left upper lobe and adjacent superior segment left lower lobe. Left lower lobe volume loss and subpleural soft tissue thickening are similar, including at 1.8 x 0.8 cm on image 91/7. 2.3 x 0.8 cm on the prior exam (when remeasured). A subpleural left lower lobe pulmonary nodule of 6 mm is unchanged including back to 02/09/2016 and likely benign. Musculoskeletal: Fourth and fifth anterior right rib foci of sclerosis are new in the interval but likely posttraumatic, given distribution. CT ABDOMEN PELVIS FINDINGS Hepatobiliary: Vague steatosis adjacent the falciform ligament. No suspicious liver lesion. Normal gallbladder, without biliary ductal dilatation. Pancreas: Normal, without mass or ductal dilatation. Spleen: Normal in size, without focal abnormality. Adrenals/Urinary Tract: Normal adrenal glands. Normal left adrenal gland. Too small to characterize upper pole right renal lesion. No hydronephrosis. The distal left ureter is borderline prominent, without cause identified. Normal urinary bladder. Stomach/Bowel: Proximal gastric underdistention. Scattered colonic diverticula. Presumed muscular hypertrophy in the sigmoid. Normal terminal ileum and appendix. Normal small bowel. Vascular/Lymphatic: Aortic and branch vessel atherosclerosis. No abdominopelvic adenopathy. Reproductive: Mild prostatomegaly. Other: No significant free fluid. No evidence of omental or peritoneal disease. Fat containing left inguinal hernia is tiny. There is also a fat containing periumbilical hernia. Musculoskeletal: Degenerate changes of both sacroiliac joints. Degenerate disc disease at the lumbosacral junction. IMPRESSION: 1. Surgical changes within the left lung with similar presumed nodular scarring. No findings of recurrent or metastatic disease within the chest, abdomen, or pelvis. 2. Aortic atherosclerosis (ICD10-I70.0), coronary artery  atherosclerosis and emphysema (ICD10-J43.9). 3. New right fourth and fifth anterior rib foci of sclerosis, likely posttraumatic. Electronically Signed   By: KAbigail MiyamotoM.D.   On: 12/01/2017 16:20   Mr BJeri CosWDHContrast  Result Date: 11/17/2017 CLINICAL DATA:  Metastatic lung cancer. History of craniotomy and stereotactic radio surgery. Repeat resection of enhancing lesion left parietal lobe on 05/12/2017. Pathology revealed no malignancy. EXAM: MRI HEAD WITHOUT AND WITH CONTRAST TECHNIQUE: Multiplanar, multiecho pulse sequences of the brain and surrounding structures were obtained without and with intravenous contrast. CONTRAST:  175mMULTIHANCE GADOBENATE DIMEGLUMINE 529 MG/ML IV SOLN COMPARISON:  MRI head 08/18/2017 FINDINGS: Brain: Enhancing lesion left frontal parietal lobe is slightly smaller on today's study now measuring 15 x 16 mm. Lesion is non masslike now with multiple concave margins. Decreased surrounding white matter edema. No new enhancing mass lesions in the brain. Ventricle size normal. Negative for hemorrhage. Several small white matter lesions compatible with mild chronic microvascular ischemia. Vascular: Normal arterial flow void Skull and upper cervical spine: Left parietal craniotomy. Sinuses/Orbits: Retention cyst right maxillary sinus.  Normal orbit Other: None IMPRESSION: Enhancing lesion left frontal parietal lobe shows interval improvement in size and appearance. Also decreased surrounding edema. Prior biopsy of the lesion indicated no malignant cells. No new lesions identified. Electronically Signed   By: ChFranchot Gallo.D.  On: 11/17/2017 13:34   Ct Abdomen Pelvis W Contrast  Result Date: 12/01/2017 CLINICAL DATA:  Metastatic non-small-cell lung cancer. Adenocarcinoma. Prior immunotherapy. Currently on observation. EXAM: CT CHEST, ABDOMEN, AND PELVIS WITH CONTRAST TECHNIQUE: Multidetector CT imaging of the chest, abdomen and pelvis was performed following the standard  protocol during bolus administration of intravenous contrast. CONTRAST:  164m OMNIPAQUE IOHEXOL 300 MG/ML  SOLN COMPARISON:  08/31/2017 and the brain MR of 11/17/2017. FINDINGS: CT CHEST FINDINGS Cardiovascular: Aortic atherosclerosis. Normal heart size, without pericardial effusion. Multivessel coronary artery atherosclerosis. No central pulmonary embolism, on this non-dedicated study. Mediastinum/Nodes: No supraclavicular adenopathy. No mediastinal or hilar adenopathy. Lungs/Pleura: No pleural fluid.  Moderate bullous type emphysema. Surgical sutures in the posterior left upper lobe and adjacent superior segment left lower lobe. Left lower lobe volume loss and subpleural soft tissue thickening are similar, including at 1.8 x 0.8 cm on image 91/7. 2.3 x 0.8 cm on the prior exam (when remeasured). A subpleural left lower lobe pulmonary nodule of 6 mm is unchanged including back to 02/09/2016 and likely benign. Musculoskeletal: Fourth and fifth anterior right rib foci of sclerosis are new in the interval but likely posttraumatic, given distribution. CT ABDOMEN PELVIS FINDINGS Hepatobiliary: Vague steatosis adjacent the falciform ligament. No suspicious liver lesion. Normal gallbladder, without biliary ductal dilatation. Pancreas: Normal, without mass or ductal dilatation. Spleen: Normal in size, without focal abnormality. Adrenals/Urinary Tract: Normal adrenal glands. Normal left adrenal gland. Too small to characterize upper pole right renal lesion. No hydronephrosis. The distal left ureter is borderline prominent, without cause identified. Normal urinary bladder. Stomach/Bowel: Proximal gastric underdistention. Scattered colonic diverticula. Presumed muscular hypertrophy in the sigmoid. Normal terminal ileum and appendix. Normal small bowel. Vascular/Lymphatic: Aortic and branch vessel atherosclerosis. No abdominopelvic adenopathy. Reproductive: Mild prostatomegaly. Other: No significant free fluid. No evidence of  omental or peritoneal disease. Fat containing left inguinal hernia is tiny. There is also a fat containing periumbilical hernia. Musculoskeletal: Degenerate changes of both sacroiliac joints. Degenerate disc disease at the lumbosacral junction. IMPRESSION: 1. Surgical changes within the left lung with similar presumed nodular scarring. No findings of recurrent or metastatic disease within the chest, abdomen, or pelvis. 2. Aortic atherosclerosis (ICD10-I70.0), coronary artery atherosclerosis and emphysema (ICD10-J43.9). 3. New right fourth and fifth anterior rib foci of sclerosis, likely posttraumatic. Electronically Signed   By: KAbigail MiyamotoM.D.   On: 12/01/2017 16:20    ASSESSMENT AND PLAN:  This is a very pleasant 57years old white male with metastatic non-small cell lung cancer, adenocarcinoma with positive PDL 1 expression of 100%. He completed treatment with Ketruda (pembrolizumab) 200 mg IV every 3 weeks, status post 35 cycles, a total of 2 years. He is currently on observation and doing fine. He had repeat CT scan of the chest, abdomen and pelvis performed recently.  I personally and independently reviewed the scans and discussed the results with the patient and his wife.  His a scan showed no concerning findings for disease recurrence or progression. I recommended for the patient to continue on observation with repeat CT scan of the chest, abdomen and pelvis in 3 months. For the lower extremity weakness and tumor necrosis, he is followed by Dr. VMickeal Skinner He was advised to call immediately if he has any concerning symptoms in the interval. The patient voices understanding of current disease status and treatment options and is in agreement with the current care plan. All questions were answered. The patient knows to call the clinic with any problems, questions  or concerns. We can certainly see the patient much sooner if necessary.   Disclaimer: This note was dictated with voice recognition  software. Similar sounding words can inadvertently be transcribed and may not be corrected upon review.

## 2017-12-12 ENCOUNTER — Telehealth: Payer: Self-pay | Admitting: *Deleted

## 2017-12-12 NOTE — Telephone Encounter (Signed)
Called in Remeron Rx for patient, pt notified.

## 2017-12-20 ENCOUNTER — Telehealth: Payer: Self-pay | Admitting: *Deleted

## 2017-12-20 MED ORDER — HYDROCORTISONE 20 MG PO TABS: 20.0000 mg | ORAL_TABLET | Freq: Every day | ORAL | 3 refills | Status: DC

## 2017-12-20 MED ORDER — DEXAMETHASONE 1 MG PO TABS: 1.0000 mg | ORAL_TABLET | Freq: Every day | ORAL | 3 refills | Status: DC

## 2017-12-20 MED ORDER — PANTOPRAZOLE SODIUM 40 MG PO TBEC: 40.0000 mg | DELAYED_RELEASE_TABLET | Freq: Every day | ORAL | 3 refills | Status: DC

## 2017-12-20 MED ORDER — HYDROCORTISONE 10 MG PO TABS: 10.0000 mg | ORAL_TABLET | Freq: Every evening | ORAL | 3 refills | Status: DC

## 2017-12-20 NOTE — Telephone Encounter (Signed)
Spoke with Ms. Bieser.  Recommended the following: -Resume decadron 2mg  daily for 7 days, followed by 1mg  daily for 7 days, then stop therapy -Will bridge with standing hydrocortisone 20mg  AM, 10mg  PM dosed indefinitely -Protonix 40mg  daily for GI/reflux symptoms described over the phone -Return to clinic as needed if not totally improved  Ventura Sellers, MD

## 2017-12-20 NOTE — Addendum Note (Signed)
Addended by: Ventura Sellers on: 12/20/2017 10:02 AM   Modules accepted: Orders

## 2017-12-20 NOTE — Telephone Encounter (Signed)
Wife Timothy Obrien called stating pt had weaned off decadron as instructed by Dr. Mickeal Skinner.  Since the weaning process, pt experiences extreme fatigue, has nausea/vomiting ( none for past 24 hours ), increased indigestion.   Pt c/o of headache is back - which pt describes as  Neck ache.  Right leg very weak - worse since stopping decadron, and balancing is off. Pt is not up to doing any strengthening exercise due to extreme fatigue.  Timothy Obrien also has question about pt receiving Avastin as discussed at last office visit.  At what point, and when will pt need Avastin ? Pt had taken Tylenol 1000 mg at 7 pm last night with some relief for neck ache.   Timothy Obrien would like to know what Dr. Mickeal Skinner would suggest. [place]     Phone   [phone].

## 2017-12-27 ENCOUNTER — Telehealth: Payer: Self-pay | Admitting: *Deleted

## 2017-12-27 NOTE — Telephone Encounter (Signed)
Likely this is from decreasing decadron.  This may abate after 3-4 days as his body adjusts to new dose level.  If not improved by early next week he should give [place] a call and would consider increasing back to 2mg  daily.  Ventura Sellers, MD

## 2017-12-27 NOTE — Telephone Encounter (Signed)
Timothy Obrien left a message stating he took the decadron 2 mg daily X 1 week and is taking hydrocortisone 20 mg in am and 10 mg in pm. Started decadron 1 mg today, is having headache and neck aches. Wants to know if he needs to make any changes. Is concerned HAs Timothy Obrien get worse.   States protonix helped the heartburn, nausea and diarrhea.

## 2017-12-29 ENCOUNTER — Ambulatory Visit: Payer: Medicare Other | Admitting: Internal Medicine

## 2018-01-11 ENCOUNTER — Other Ambulatory Visit: Payer: Self-pay | Admitting: *Deleted

## 2018-01-11 NOTE — Progress Notes (Signed)
Patient called to report his status of weaning off of Decadron with the help of the Hydrocortisone bridge dose.  He reports his last dose of Decadron was day before Christmas.  He reports taking Hydrocortisone 20 mg in the morning and 10 mg in the evening.  He denies having any issues with Decadron wean and wanted to know if and when he should begin weaning off of Hydrocortisone.    Per Dr. Mickeal Skinner, end of next week may drop evening dose of Hydrocortisone 10 mg and he should only continue morning dose and remain on that until he sees Dr Julien Nordmann in February.  He needs to give report at that time of how he is adjusting to the wean.  At that time we can evaluate need for further wean in preparation for his follow up with [place] in March 2020.  Patient expressed understanding and denies having any further questions.

## 2018-03-02 ENCOUNTER — Inpatient Hospital Stay: Payer: Medicare Other | Attending: Internal Medicine

## 2018-03-02 ENCOUNTER — Ambulatory Visit (HOSPITAL_COMMUNITY)
Admission: RE | Admit: 2018-03-02 | Discharge: 2018-03-02 | Disposition: A | Discharge status: Home / Self Care | Payer: Medicare Other | Source: Ambulatory Visit | Attending: Internal Medicine | Admitting: Internal Medicine

## 2018-03-02 ENCOUNTER — Encounter (HOSPITAL_COMMUNITY): Payer: Self-pay

## 2018-03-02 DIAGNOSIS — C3490 Malignant neoplasm of unspecified part of unspecified bronchus or lung: Secondary | ICD-10-CM | POA: Insufficient documentation

## 2018-03-02 DIAGNOSIS — C7931 Secondary malignant neoplasm of brain: Secondary | ICD-10-CM | POA: Diagnosis not present

## 2018-03-02 DIAGNOSIS — C3432 Malignant neoplasm of lower lobe, left bronchus or lung: Secondary | ICD-10-CM | POA: Diagnosis not present

## 2018-03-02 DIAGNOSIS — R531 Weakness: Secondary | ICD-10-CM | POA: Insufficient documentation

## 2018-03-02 LAB — CBC WITH DIFFERENTIAL (CANCER CENTER ONLY)
ABS IMMATURE GRANULOCYTES: 0.02 10*3/uL (ref 0.00–0.07)
BASOS ABS: 0.1 10*3/uL (ref 0.0–0.1)
Basophils Relative: 1 %
EOS PCT: 2 %
Eosinophils Absolute: 0.2 10*3/uL (ref 0.0–0.5)
HEMATOCRIT: 44.9 % (ref 39.0–52.0)
HEMOGLOBIN: 15.1 g/dL (ref 13.0–17.0)
Immature Granulocytes: 0 %
LYMPHS PCT: 20 %
Lymphs Abs: 1.8 10*3/uL (ref 0.7–4.0)
MCH: 32.3 pg (ref 26.0–34.0)
MCHC: 33.6 g/dL (ref 30.0–36.0)
MCV: 95.9 fL (ref 80.0–100.0)
Monocytes Absolute: 0.9 10*3/uL (ref 0.1–1.0)
Monocytes Relative: 9 %
NRBC: 0 % (ref 0.0–0.2)
Neutro Abs: 6.4 10*3/uL (ref 1.7–7.7)
Neutrophils Relative %: 68 %
Platelet Count: 307 10*3/uL (ref 150–400)
RBC: 4.68 MIL/uL (ref 4.22–5.81)
RDW: 12.3 % (ref 11.5–15.5)
WBC Count: 9.4 10*3/uL (ref 4.0–10.5)

## 2018-03-02 LAB — CMP (CANCER CENTER ONLY)
ALK PHOS: 85 U/L (ref 38–126)
ALT: 17 U/L (ref 0–44)
AST: 21 U/L (ref 15–41)
Albumin: 3.8 g/dL (ref 3.5–5.0)
Anion gap: 12 (ref 5–15)
BUN: 15 mg/dL (ref 6–20)
CALCIUM: 8.7 mg/dL — AB (ref 8.9–10.3)
CO2: 18 mmol/L — AB (ref 22–32)
CREATININE: 0.96 mg/dL (ref 0.61–1.24)
Chloride: 109 mmol/L (ref 98–111)
GFR, Est AFR Am: >60 mL/min (ref >60)
GFR, Estimated: >60 mL/min (ref >60)
Glucose, Bld: 90 mg/dL (ref 70–99)
Potassium: 4 mmol/L (ref 3.5–5.1)
SODIUM: 139 mmol/L (ref 135–145)
Total Bilirubin: 0.3 mg/dL (ref 0.3–1.2)
Total Protein: 6.7 g/dL (ref 6.5–8.1)

## 2018-03-02 MED ORDER — IOHEXOL 300 MG/ML  SOLN
100.0000 mL | Freq: Once | INTRAMUSCULAR | Status: AC | PRN
  Administered 2018-03-02: 100 mL via INTRAVENOUS

## 2018-03-02 MED ORDER — IOHEXOL 300 MG/ML  SOLN
30.0000 mL | Freq: Once | INTRAMUSCULAR | Status: AC | PRN
  Administered 2018-03-02: 30 mL via ORAL

## 2018-03-02 MED ORDER — SODIUM CHLORIDE (PF) 0.9 % IJ SOLN
INTRAMUSCULAR | Status: AC
  Filled 2018-03-02: qty 50

## 2018-03-05 ENCOUNTER — Other Ambulatory Visit: Payer: Self-pay | Admitting: Internal Medicine

## 2018-03-05 DIAGNOSIS — G4700 Insomnia, unspecified: Secondary | ICD-10-CM

## 2018-03-06 ENCOUNTER — Encounter: Payer: Self-pay | Admitting: Internal Medicine

## 2018-03-06 ENCOUNTER — Inpatient Hospital Stay (HOSPITAL_BASED_OUTPATIENT_CLINIC_OR_DEPARTMENT_OTHER): Payer: Medicare Other | Admitting: Internal Medicine

## 2018-03-06 ENCOUNTER — Telehealth: Payer: Self-pay | Admitting: Internal Medicine

## 2018-03-06 VITALS — BP 137/96 | HR 74 | Temp 97.8°F | Resp 18 | Ht 72.0 in | Wt 195.5 lb

## 2018-03-06 DIAGNOSIS — C7931 Secondary malignant neoplasm of brain: Secondary | ICD-10-CM

## 2018-03-06 DIAGNOSIS — C3432 Malignant neoplasm of lower lobe, left bronchus or lung: Secondary | ICD-10-CM

## 2018-03-06 DIAGNOSIS — R531 Weakness: Secondary | ICD-10-CM

## 2018-03-06 DIAGNOSIS — R29898 Other symptoms and signs involving the musculoskeletal system: Secondary | ICD-10-CM

## 2018-03-06 DIAGNOSIS — C3490 Malignant neoplasm of unspecified part of unspecified bronchus or lung: Secondary | ICD-10-CM

## 2018-03-06 DIAGNOSIS — I10 Essential (primary) hypertension: Secondary | ICD-10-CM

## 2018-03-06 NOTE — Telephone Encounter (Signed)
Scheduled appt per 2/25 los.  Printed calendar and avs.

## 2018-03-06 NOTE — Progress Notes (Signed)
Timothy Obrien [phone]   [phone]  OFFICE PROGRESS NOTE  [name], MD [address] [place] [address]  DIAGNOSIS:  Stage IV (T2a, N1, M1b) non-small cell lung cancer, adenocarcinoma, negative EGFR mutation but equivocal EGFR amplification, negative ALK gene translocation and negative ROS 1 but with PDL-1 expression 100% presented with left lower lobe lung mass in addition to left hilar adenopathy and solitary metastatic brain lesion diagnosed in December 2016.  PRIOR THERAPY: 1) status post stereotactic radiotherapy and surgical resection diagnosed in December 2016. 2) status post video bronchoscopy with left VATS and wedge resection of the left upper lobe and left lower lobe superior segmentectomy with lymph node dissection under the care of Dr. Servando Snare on 01/14/2015. 3) First-line treatment with immunotherapy with Ketruda (pembrolizumab) 200 mg IV every 3 weeks status post 35 cycles. First cycle was given 02/18/2015.  Discontinued after the patient completed 2 years of treatment. 4) stereotactic left frontal craniotomy for resection of tumor and the final pathology was consistent with tumor necrosis.  This was done on 05/12/2017  CURRENT THERAPY: Observation.  INTERVAL HISTORY: Timothy Obrien 58 y.o. male returns to the clinic today for follow-up visit accompanied by his ex-wife.  The patient is feeling fine today with no concerning complaints except for the weakness in the lower extremities secondary to the tumor necrosis in the brain.  He is now completely of steroid.  He denied having any recent chest pain, shortness of breath, cough or hemoptysis.  He has no recent weight loss or night sweats.  He has no nausea, vomiting, diarrhea or constipation.  The patient had repeat CT scan of the chest, abdomen and pelvis performed recently and he is here for evaluation and discussion of his scan results.  MEDICAL HISTORY: Past Medical  History:  Diagnosis Date  . Anxiety   . Bronchitis   . Chronic fatigue 09/17/2015  . Depression 02/11/2016  . Hypertension 12/31/2015   states never been told  . Lung cancer ([place])    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  . Pneumonia   . Seizures ([place])    last 12/11/14  . Shortness of breath dyspnea    with exertion  . Tobacco abuse     ALLERGIES:  has No Known Allergies.  MEDICATIONS:  Current Outpatient Medications  Medication Sig Dispense Refill  . acetaminophen-codeine (TYLENOL #3) 300-30 MG tablet Take 1 tablet by mouth every 6 (six) hours as needed for moderate pain. (Patient not taking: Reported on 05/23/2017) 60 tablet 0  . ALPRAZolam (XANAX) 0.25 MG tablet Take 0.25 mg by mouth daily as needed for anxiety.     Marland Kitchen dexamethasone (DECADRON) 1 MG tablet Take 1 tablet (1 mg total) by mouth daily with breakfast. 30 tablet 3  . dexamethasone (DECADRON) 2 MG tablet Take 1 tablet (2 mg total) by mouth daily. 30 tablet 3  . hydrocortisone (CORTEF) 10 MG tablet Take 1 tablet (10 mg total) by mouth every evening. 30 tablet 3  . hydrocortisone (CORTEF) 20 MG tablet Take 1 tablet (20 mg total) by mouth daily. 30 tablet 3  . levETIRAcetam (KEPPRA) 500 MG tablet Take 1 tablet (500 mg total) by mouth every 12 (twelve) hours. 60 tablet 5  . mirtazapine (REMERON) 30 MG tablet TAKE 1 TABLET BY MOUTH EVERY DAY AT BEDTIME 90 tablet 0  . pantoprazole (PROTONIX) 40 MG tablet Take 1 tablet (40 mg total) by mouth daily.  30 tablet 3   No current facility-administered medications for this visit.     SURGICAL HISTORY:  Past Surgical History:  Procedure Laterality Date  . APPLICATION OF CRANIAL NAVIGATION N/A 12/26/2014   Procedure: APPLICATION OF CRANIAL NAVIGATION;  Surgeon: Kevan Ny Ditty, MD;  Location: [facility];  Service: Neurosurgery;  Laterality: N/A;  . APPLICATION OF CRANIAL NAVIGATION Left 05/12/2017   Procedure: APPLICATION OF CRANIAL NAVIGATION;  Surgeon: Consuella Lose, MD;   Location: [facility];  Service: Neurosurgery;  Laterality: Left;  APPLICATION OF CRANIAL NAVIGATION  . CRANIOTOMY N/A 12/26/2014   Procedure: CRANIOTOMY TUMOR EXCISION with Brain[facility];  Surgeon: Kevan Ny Ditty, MD;  Location: [facility];  Service: Neurosurgery;  Laterality: N/A;  CRANIOTOMY TUMOR EXCISION with [facility]  . CRANIOTOMY Left 05/12/2017   Procedure: STEREOTACTIC LEFT FRONTOPERIETIAL CRANIOTOMY FOR RESECTION OF TUMOR;  Surgeon: Consuella Lose, MD;  Location: [facility];  Service: Neurosurgery;  Laterality: Left;  STEREOTACTIC LEFT FRONTOPERIETIAL CRANIOTOMY FOR RESECTION OF TUMOR  . HERNIA REPAIR    . VASECTOMY    . VIDEO ASSISTED THORACOSCOPY (VATS)/WEDGE RESECTION Left 01/14/2015   Procedure: VIDEO ASSISTED THORACOSCOPY (VATS)/WEDGE RESECTION, Superior segmentectomy left  lower lobe, wedge resection of left upper lobe, multiple lymph node disection, On Q insertion.;  Surgeon: Grace Isaac, MD;  Location: [facility];  Service: Thoracic;  Laterality: Left;  Marland Kitchen VIDEO BRONCHOSCOPY Bilateral 12/16/2014   Procedure: VIDEO BRONCHOSCOPY WITH FLUORO;  Surgeon: Collene Gobble, MD;  Location: [facility];  Service: Cardiopulmonary;  Laterality: Bilateral;  . VIDEO BRONCHOSCOPY N/A 01/14/2015   Procedure: VIDEO BRONCHOSCOPY;  Surgeon: Grace Isaac, MD;  Location: [facility];  Service: Thoracic;  Laterality: N/A;    REVIEW OF SYSTEMS:  A comprehensive review of systems was negative except for: Constitutional: positive for fatigue Musculoskeletal: positive for muscle weakness   PHYSICAL EXAMINATION: General appearance: alert, cooperative, fatigued and no distress Head: Normocephalic, without obvious abnormality, atraumatic Neck: no adenopathy, no JVD, supple, symmetrical, trachea midline and thyroid not enlarged, symmetric, no tenderness/mass/nodules Lymph nodes: Cervical, supraclavicular, and axillary nodes normal. Resp: clear to auscultation bilaterally Back: symmetric, no curvature. ROM normal. No  CVA tenderness. Cardio: regular rate and rhythm, S1, S2 normal, no murmur, click, rub or gallop GI: soft, non-tender; bowel sounds normal; no masses,  no organomegaly Extremities: extremities normal, atraumatic, no cyanosis or edema  ECOG PERFORMANCE STATUS: 1 - Symptomatic but completely ambulatory  Blood pressure (!) 137/96, pulse 74, temperature 97.8 F (36.6 C), temperature source Oral, resp. rate 18, height 6' (1.829 m), weight 195 lb 8 oz (88.7 kg), SpO2 99 %.  LABORATORY DATA: Lab Results  Component Value Date   WBC 9.4 03/02/2018   HGB 15.1 03/02/2018   HCT 44.9 03/02/2018   MCV 95.9 03/02/2018   PLT 307 03/02/2018      Chemistry      Component Value Date/Time   NA 139 03/02/2018 1106   NA 137 01/12/2017 0846   K 4.0 03/02/2018 1106   K 4.4 01/12/2017 0846   CL 109 03/02/2018 1106   CO2 18 (L) 03/02/2018 1106   CO2 18 (L) 01/12/2017 0846   BUN 15 03/02/2018 1106   BUN 15.3 01/12/2017 0846   CREATININE 0.96 03/02/2018 1106   CREATININE 1.0 01/12/2017 0846      Component Value Date/Time   CALCIUM 8.7 (L) 03/02/2018 1106   CALCIUM 9.0 01/12/2017 0846   ALKPHOS 85 03/02/2018 1106   ALKPHOS 99 01/12/2017 0846   AST 21 03/02/2018  1106   AST 23 01/12/2017 0846   ALT 17 03/02/2018 1106   ALT 22 01/12/2017 0846   BILITOT 0.3 03/02/2018 1106   BILITOT 0.41 01/12/2017 0846       RADIOGRAPHIC STUDIES: Ct Chest W Contrast  Result Date: 03/02/2018 CLINICAL DATA:  Lung cancer.  Left lower lobe adenocarcinoma. EXAM: CT CHEST, ABDOMEN, AND PELVIS WITH CONTRAST TECHNIQUE: Multidetector CT imaging of the chest, abdomen and pelvis was performed following the standard protocol during bolus administration of intravenous contrast. CONTRAST:  18m OMNIPAQUE IOHEXOL 300 MG/ML  SOLN COMPARISON:  12/01/2017 FINDINGS: CT CHEST FINDINGS Cardiovascular: The heart size is normal. No substantial pericardial effusion. Coronary artery calcification is evident. Mediastinum/Nodes: No  mediastinal lymphadenopathy. There is no hilar lymphadenopathy. The esophagus has normal imaging features. There is no axillary lymphadenopathy. Lungs/Pleura: The central tracheobronchial airways are patent. Centrilobular and paraseptal emphysema evident. No suspicious nodule or mass in the right lung. Surgical changes noted in the medial left lung. Subpleural left lower lobe paraspinal nodule measured previously 1.8 x 0.8 cm now measures 1.8 x 0.9 cm. 6 mm nodular opacity measured in the deep posterior left costophrenic sulcus previously is 7 mm today. Probable atelectasis or scarring again noted in the medial aspect of the posterior left costophrenic sulcus. No pleural effusion. Musculoskeletal: Old right-sided rib fractures noted. No worrisome lytic or sclerotic osseous abnormality. CT ABDOMEN PELVIS FINDINGS Hepatobiliary: No suspicious focal abnormality within the liver parenchyma. Gallbladder decompressed. No intrahepatic or extrahepatic biliary dilation. Pancreas: No focal mass lesion. No dilatation of the main duct. No intraparenchymal cyst. No peripancreatic edema. Spleen: No splenomegaly. No focal mass lesion. Adrenals/Urinary Tract: No adrenal nodule or mass. Stable tiny hypodensity in the subcapsular upper pole right kidney compatible with cyst. Left kidney unremarkable. No evidence for hydroureter. The urinary bladder appears normal for the degree of distention. Stomach/Bowel: Stomach is unremarkable. No gastric wall thickening. No evidence of outlet obstruction. Duodenum is normally positioned as is the ligament of Treitz. No small bowel wall thickening. No small bowel dilatation. The terminal ileum is normal. The appendix is normal. Diverticular changes are noted in the left colon without evidence of diverticulitis. Vascular/Lymphatic: There is abdominal aortic atherosclerosis without aneurysm. There is no gastrohepatic or hepatoduodenal ligament lymphadenopathy. No intraperitoneal or retroperitoneal  lymphadenopathy. No pelvic sidewall lymphadenopathy. Reproductive: The prostate gland and seminal vesicles are unremarkable. Other: No intraperitoneal free fluid. Musculoskeletal: Small left groin hernia contains only fat. No worrisome lytic or sclerotic osseous abnormality. IMPRESSION: 1. Stable exam.  No new or progressive interval findings. 2. Surgical scarring again noted in the medial left lung. Nodular left lower lobe opacities remain stable. Continued attention on follow-up recommended. 3.  Aortic Atherosclerois (ICD10-170.0) 4.  Emphysema. (([id number]) Electronically Signed   By: EMisty StanleyM.D.   On: 03/02/2018 13:56   Ct Abdomen Pelvis W Contrast  Result Date: 03/02/2018 CLINICAL DATA:  Lung cancer.  Left lower lobe adenocarcinoma. EXAM: CT CHEST, ABDOMEN, AND PELVIS WITH CONTRAST TECHNIQUE: Multidetector CT imaging of the chest, abdomen and pelvis was performed following the standard protocol during bolus administration of intravenous contrast. CONTRAST:  1051mOMNIPAQUE IOHEXOL 300 MG/ML  SOLN COMPARISON:  12/01/2017 FINDINGS: CT CHEST FINDINGS Cardiovascular: The heart size is normal. No substantial pericardial effusion. Coronary artery calcification is evident. Mediastinum/Nodes: No mediastinal lymphadenopathy. There is no hilar lymphadenopathy. The esophagus has normal imaging features. There is no axillary lymphadenopathy. Lungs/Pleura: The central tracheobronchial airways are patent. Centrilobular and paraseptal emphysema evident. No suspicious nodule or mass  in the right lung. Surgical changes noted in the medial left lung. Subpleural left lower lobe paraspinal nodule measured previously 1.8 x 0.8 cm now measures 1.8 x 0.9 cm. 6 mm nodular opacity measured in the deep posterior left costophrenic sulcus previously is 7 mm today. Probable atelectasis or scarring again noted in the medial aspect of the posterior left costophrenic sulcus. No pleural effusion. Musculoskeletal: Old right-sided  rib fractures noted. No worrisome lytic or sclerotic osseous abnormality. CT ABDOMEN PELVIS FINDINGS Hepatobiliary: No suspicious focal abnormality within the liver parenchyma. Gallbladder decompressed. No intrahepatic or extrahepatic biliary dilation. Pancreas: No focal mass lesion. No dilatation of the main duct. No intraparenchymal cyst. No peripancreatic edema. Spleen: No splenomegaly. No focal mass lesion. Adrenals/Urinary Tract: No adrenal nodule or mass. Stable tiny hypodensity in the subcapsular upper pole right kidney compatible with cyst. Left kidney unremarkable. No evidence for hydroureter. The urinary bladder appears normal for the degree of distention. Stomach/Bowel: Stomach is unremarkable. No gastric wall thickening. No evidence of outlet obstruction. Duodenum is normally positioned as is the ligament of Treitz. No small bowel wall thickening. No small bowel dilatation. The terminal ileum is normal. The appendix is normal. Diverticular changes are noted in the left colon without evidence of diverticulitis. Vascular/Lymphatic: There is abdominal aortic atherosclerosis without aneurysm. There is no gastrohepatic or hepatoduodenal ligament lymphadenopathy. No intraperitoneal or retroperitoneal lymphadenopathy. No pelvic sidewall lymphadenopathy. Reproductive: The prostate gland and seminal vesicles are unremarkable. Other: No intraperitoneal free fluid. Musculoskeletal: Small left groin hernia contains only fat. No worrisome lytic or sclerotic osseous abnormality. IMPRESSION: 1. Stable exam.  No new or progressive interval findings. 2. Surgical scarring again noted in the medial left lung. Nodular left lower lobe opacities remain stable. Continued attention on follow-up recommended. 3.  Aortic Atherosclerois (ICD10-170.0) 4.  Emphysema. ([id number]) Electronically Signed   By: Misty Stanley M.D.   On: 03/02/2018 13:56    ASSESSMENT AND PLAN:  This is a very pleasant 58 years old white male with  metastatic non-small cell lung cancer, adenocarcinoma with positive PDL 1 expression of 100%. He completed treatment with Ketruda (pembrolizumab) 200 mg IV every 3 weeks, status post 35 cycles, a total of 2 years. The patient is currently on observation and he is feeling fine. He had repeat CT scan of the chest, abdomen and pelvis performed recently.  I personally and independently reviewed the scans and discussed the results with the patient today. His scan showed no concerning findings for disease recurrence or progression. I recommended for the patient to continue on observation with repeat CT scan of the chest, abdomen and pelvis in 4 months.  For the lower extremity weakness and tumor necrosis, he is followed by Dr. Mickeal Skinner. The patient was advised to call immediately if he has any concerning symptoms in the interval. The patient voices understanding of current disease status and treatment options and is in agreement with the current care plan. All questions were answered. The patient knows to call the clinic with any problems, questions or concerns. We can certainly see the patient much sooner if necessary.   Disclaimer: This note was dictated with voice recognition software. Similar sounding words can inadvertently be transcribed and may not be corrected upon review.

## 2018-03-12 ENCOUNTER — Other Ambulatory Visit: Payer: Self-pay | Admitting: Radiation Therapy

## 2018-03-16 ENCOUNTER — Ambulatory Visit
Admission: RE | Admit: 2018-03-16 | Discharge: 2018-03-16 | Disposition: A | Discharge status: Home / Self Care | Payer: Medicare Other | Source: Ambulatory Visit | Attending: Internal Medicine | Admitting: Internal Medicine

## 2018-03-16 DIAGNOSIS — C7931 Secondary malignant neoplasm of brain: Secondary | ICD-10-CM

## 2018-03-16 DIAGNOSIS — C3490 Malignant neoplasm of unspecified part of unspecified bronchus or lung: Secondary | ICD-10-CM | POA: Diagnosis not present

## 2018-03-16 MED ORDER — GADOBENATE DIMEGLUMINE 529 MG/ML IV SOLN
18.0000 mL | Freq: Once | INTRAVENOUS | Status: AC | PRN
  Administered 2018-03-16: 18 mL via INTRAVENOUS

## 2018-03-22 ENCOUNTER — Telehealth: Payer: Self-pay | Admitting: *Deleted

## 2018-03-22 NOTE — Telephone Encounter (Signed)
Received vm message from patient requesting his appt tomorrow be cancelled and requesting a call from Dr. Mickeal Skinner regarding results of his recent MRI instead. Pt would prefer not to come to clinic given current corona virus concerns.  Message sent to Dr. Mickeal Skinner.

## 2018-03-23 ENCOUNTER — Inpatient Hospital Stay: Payer: Medicare Other | Admitting: Internal Medicine

## 2018-03-23 ENCOUNTER — Other Ambulatory Visit: Payer: Self-pay | Admitting: Internal Medicine

## 2018-03-23 DIAGNOSIS — C7931 Secondary malignant neoplasm of brain: Secondary | ICD-10-CM

## 2018-04-05 ENCOUNTER — Other Ambulatory Visit: Payer: Self-pay | Admitting: *Deleted

## 2018-04-05 DIAGNOSIS — C3490 Malignant neoplasm of unspecified part of unspecified bronchus or lung: Secondary | ICD-10-CM

## 2018-04-30 ENCOUNTER — Other Ambulatory Visit: Payer: Self-pay | Admitting: Internal Medicine

## 2018-05-30 ENCOUNTER — Other Ambulatory Visit: Payer: Self-pay | Admitting: Internal Medicine

## 2018-05-30 DIAGNOSIS — G4700 Insomnia, unspecified: Secondary | ICD-10-CM

## 2018-06-13 ENCOUNTER — Telehealth: Payer: Self-pay | Admitting: Internal Medicine

## 2018-06-13 ENCOUNTER — Telehealth (HOSPITAL_COMMUNITY): Payer: Self-pay

## 2018-06-13 ENCOUNTER — Telehealth: Payer: Self-pay | Admitting: Medical Oncology

## 2018-06-13 NOTE — Telephone Encounter (Signed)
Pt wants to move out scan and f/u from June to mid July. Schedule message sent and CT scan order date  moved .

## 2018-06-13 NOTE — Telephone Encounter (Signed)
R/s appt per 6/3 sch message - unable to reach pt . Left message with new appt date and time

## 2018-07-02 ENCOUNTER — Other Ambulatory Visit: Payer: Medicare Other

## 2018-07-03 ENCOUNTER — Ambulatory Visit: Payer: Medicare Other | Admitting: Internal Medicine

## 2018-07-23 ENCOUNTER — Inpatient Hospital Stay: Payer: Medicare Other | Attending: Internal Medicine

## 2018-07-23 ENCOUNTER — Other Ambulatory Visit: Payer: Self-pay

## 2018-07-23 ENCOUNTER — Ambulatory Visit (HOSPITAL_COMMUNITY)
Admission: RE | Admit: 2018-07-23 | Discharge: 2018-07-23 | Disposition: A | Discharge status: Home / Self Care | Payer: Medicare Other | Source: Ambulatory Visit | Attending: Internal Medicine | Admitting: Internal Medicine

## 2018-07-23 DIAGNOSIS — C7931 Secondary malignant neoplasm of brain: Secondary | ICD-10-CM | POA: Diagnosis not present

## 2018-07-23 DIAGNOSIS — K5730 Diverticulosis of large intestine without perforation or abscess without bleeding: Secondary | ICD-10-CM | POA: Diagnosis not present

## 2018-07-23 DIAGNOSIS — C3432 Malignant neoplasm of lower lobe, left bronchus or lung: Secondary | ICD-10-CM | POA: Insufficient documentation

## 2018-07-23 DIAGNOSIS — C3490 Malignant neoplasm of unspecified part of unspecified bronchus or lung: Secondary | ICD-10-CM | POA: Diagnosis not present

## 2018-07-23 DIAGNOSIS — J432 Centrilobular emphysema: Secondary | ICD-10-CM | POA: Diagnosis not present

## 2018-07-23 DIAGNOSIS — C3431 Malignant neoplasm of lower lobe, right bronchus or lung: Secondary | ICD-10-CM | POA: Diagnosis not present

## 2018-07-23 LAB — CMP (CANCER CENTER ONLY)
ALT: 20 U/L (ref 0–44)
AST: 29 U/L (ref 15–41)
Albumin: 4.1 g/dL (ref 3.5–5.0)
Alkaline Phosphatase: 106 U/L (ref 38–126)
Anion gap: 11 (ref 5–15)
BUN: 15 mg/dL (ref 6–20)
CO2: 17 mmol/L — ABNORMAL LOW (ref 22–32)
Calcium: 8.7 mg/dL — ABNORMAL LOW (ref 8.9–10.3)
Chloride: 109 mmol/L (ref 98–111)
Creatinine: 1.02 mg/dL (ref 0.61–1.24)
GFR, Est AFR Am: >60 mL/min (ref >60)
GFR, Estimated: >60 mL/min (ref >60)
Glucose, Bld: 94 mg/dL (ref 70–99)
Potassium: 4.3 mmol/L (ref 3.5–5.1)
Sodium: 137 mmol/L (ref 135–145)
Total Bilirubin: 0.3 mg/dL (ref 0.3–1.2)
Total Protein: 7.3 g/dL (ref 6.5–8.1)

## 2018-07-23 LAB — CBC WITH DIFFERENTIAL (CANCER CENTER ONLY)
Abs Immature Granulocytes: 0.03 10*3/uL (ref 0.00–0.07)
Basophils Absolute: 0.1 10*3/uL (ref 0.0–0.1)
Basophils Relative: 1 %
Eosinophils Absolute: 0.1 10*3/uL (ref 0.0–0.5)
Eosinophils Relative: 1 %
HCT: 46.5 % (ref 39.0–52.0)
Hemoglobin: 15.9 g/dL (ref 13.0–17.0)
Immature Granulocytes: 0 %
Lymphocytes Relative: 17 %
Lymphs Abs: 1.6 10*3/uL (ref 0.7–4.0)
MCH: 32.3 pg (ref 26.0–34.0)
MCHC: 34.2 g/dL (ref 30.0–36.0)
MCV: 94.5 fL (ref 80.0–100.0)
Monocytes Absolute: 0.9 10*3/uL (ref 0.1–1.0)
Monocytes Relative: 9 %
Neutro Abs: 6.8 10*3/uL (ref 1.7–7.7)
Neutrophils Relative %: 72 %
Platelet Count: 280 10*3/uL (ref 150–400)
RBC: 4.92 MIL/uL (ref 4.22–5.81)
RDW: 13.3 % (ref 11.5–15.5)
WBC Count: 9.4 10*3/uL (ref 4.0–10.5)
nRBC: 0 % (ref 0.0–0.2)

## 2018-07-23 MED ORDER — SODIUM CHLORIDE (PF) 0.9 % IJ SOLN
INTRAMUSCULAR | Status: AC
  Filled 2018-07-23: qty 50

## 2018-07-23 MED ORDER — IOHEXOL 300 MG/ML  SOLN
100.0000 mL | Freq: Once | INTRAMUSCULAR | Status: AC | PRN
  Administered 2018-07-23: 13:00:00 100 mL via INTRAVENOUS

## 2018-07-23 MED ORDER — IOHEXOL 300 MG/ML  SOLN
30.0000 mL | Freq: Once | INTRAMUSCULAR | Status: AC | PRN
  Administered 2018-07-23: 30 mL via ORAL

## 2018-07-25 ENCOUNTER — Inpatient Hospital Stay (HOSPITAL_BASED_OUTPATIENT_CLINIC_OR_DEPARTMENT_OTHER): Payer: Medicare Other | Admitting: Internal Medicine

## 2018-07-25 ENCOUNTER — Telehealth: Payer: Self-pay | Admitting: *Deleted

## 2018-07-25 ENCOUNTER — Encounter: Payer: Self-pay | Admitting: Internal Medicine

## 2018-07-25 DIAGNOSIS — C7931 Secondary malignant neoplasm of brain: Secondary | ICD-10-CM

## 2018-07-25 DIAGNOSIS — C3490 Malignant neoplasm of unspecified part of unspecified bronchus or lung: Secondary | ICD-10-CM

## 2018-07-25 DIAGNOSIS — C3432 Malignant neoplasm of lower lobe, left bronchus or lung: Secondary | ICD-10-CM | POA: Diagnosis not present

## 2018-07-25 NOTE — Telephone Encounter (Signed)
Updated pt's chart prior to office/Phone  visit W/MD

## 2018-07-25 NOTE — Progress Notes (Signed)
[facility] [phone]   [phone]  PROGRESS NOTE FOR TELEMEDICINE VISITS  [name], MD [address] [place] [address]  I connected [phone] on 07/25/18 at  9:45 AM EDT by telephone visit and verified that I am speaking with the correct person using two identifiers.   I discussed the limitations, risks, security and privacy concerns of performing an evaluation and management service by telemedicine and the availability of in-person appointments. I also discussed with the patient that there may be a patient responsible charge related to this service. The patient expressed understanding and agreed to proceed.  Other persons participating in the visit and their role in the encounter:  None  Patient's location:  Home Provider's location: [place]  DIAGNOSIS: Stage IV (T2a, N1, M1b) non-small cell lung cancer, adenocarcinoma, negative EGFR mutation but equivocal EGFR amplification, negative ALK gene translocation and negative ROS 1 but with PDL-1 expression 100%presented with left lower lobe lung mass in addition to left hilar adenopathy and solitary metastatic brain lesion diagnosed in December 2016.  PRIOR THERAPY: 1) status post stereotactic radiotherapy and surgical resection diagnosed in December 2016. 2) status post video bronchoscopy with left VATS and wedge resection of the left upper lobe and left lower lobe superior segmentectomy with lymph node dissection under the care of Dr. Servando Snare on 01/14/2015. 3) First-line treatment with immunotherapy with Ketruda (pembrolizumab) 200 mg IV every 3 weeks status post 35 cycles. First cycle was given 02/18/2015.  Discontinued after the patient completed 2 years of treatment. 4) stereotactic left frontal craniotomy for resection of tumor and the final pathology was consistent with tumor necrosis.  This was done on 05/12/2017  CURRENT THERAPY: Observation.  INTERVAL  HISTORY: Timothy Obrien 58 y.o. male has a telephone virtual visit with me today for evaluation and discussion of his scan results.  The patient is feeling fine today with no concerning complaints.  He denied having any chest pain, shortness of breath, cough or hemoptysis.  He denied having any fever or chills.  He has no nausea, vomiting, diarrhea or constipation.  He has no headache or visual changes.  He has no change in his medication or allergy list.  He had repeat CT scan of the chest, abdomen pelvis performed recently and he is here for evaluation and discussion of his scan results.  MEDICAL HISTORY: Past Medical History:  Diagnosis Date   Anxiety    Bronchitis    Chronic fatigue 09/17/2015   Depression 02/11/2016   Hypertension 12/31/2015   states never been told   Lung cancer ([facility])    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   Pneumonia    Seizures ([facility])    last 12/11/14   Shortness of breath dyspnea    with exertion   Tobacco abuse     ALLERGIES:  has No Known Allergies.  MEDICATIONS:  Current Outpatient Medications  Medication Sig Dispense Refill   acetaminophen (TYLENOL) 500 MG tablet Take 500 mg by mouth 2 (two) times daily as needed.     acetaminophen-codeine (TYLENOL #3) 300-30 MG tablet Take 1 tablet by mouth every 6 (six) hours as needed for moderate pain. (Patient not taking: Reported on 05/23/2017) 60 tablet 0   ALPRAZolam (XANAX) 0.25 MG tablet Take 0.25 mg by mouth daily as needed for anxiety.      ibuprofen (ADVIL,MOTRIN) 600 MG tablet Take 600 mg by mouth 2 (two) times daily as needed.  levETIRAcetam (KEPPRA) 500 MG tablet TAKE 1 TABLET BY MOUTH EVERY 12 HOURS 180 tablet 1   mirtazapine (REMERON) 30 MG tablet TAKE 1 TABLET BY MOUTH EVERYDAY AT BEDTIME 90 tablet 0   pantoprazole (PROTONIX) 40 MG tablet Take 1 tablet (40 mg total) by mouth daily. 30 tablet 3   No current facility-administered medications for this visit.     SURGICAL HISTORY:   Past Surgical History:  Procedure Laterality Date   APPLICATION OF CRANIAL NAVIGATION N/A 12/26/2014   Procedure: APPLICATION OF CRANIAL NAVIGATION;  Surgeon: Kevan Ny Ditty, MD;  Location: [facility];  Service: Neurosurgery;  Laterality: N/A;   APPLICATION OF CRANIAL NAVIGATION Left 05/12/2017   Procedure: APPLICATION OF CRANIAL NAVIGATION;  Surgeon: Consuella Lose, MD;  Location: [facility];  Service: Neurosurgery;  Laterality: Left;  APPLICATION OF CRANIAL NAVIGATION   CRANIOTOMY N/A 12/26/2014   Procedure: CRANIOTOMY TUMOR EXCISION with BrainLab;  Surgeon: Kevan Ny Ditty, MD;  Location: [facility];  Service: Neurosurgery;  Laterality: N/A;  CRANIOTOMY TUMOR EXCISION with Ste[facility]   CRANIOTOMY Left 05/12/2017   Procedure: STEREOTACTIC LEFT FRONTOPERIETIAL CRANIOTOMY FOR RESECTION OF TUMOR;  Surgeon: Consuella Lose, MD;  Location: [facility];  Service: Neurosurgery;  Laterality: Left;  STEREOTACTIC LEFT FRONTOPERIETIAL CRANIOTOMY FOR RESECTION OF TUMOR   HERNIA REPAIR     VASECTOMY     VIDEO ASSISTED THORACOSCOPY (VATS)/WEDGE RESECTION Left 01/14/2015   Procedure: VIDEO ASSISTED THORACOSCOPY (VATS)/WEDGE RESECTION, Superior segmentectomy left  lower lobe, wedge resection of left upper lobe, multiple lymph node disection, On Q insertion.;  Surgeon: Grace Isaac, MD;  Location: [facility];  Service: Thoracic;  Laterality: Left;   VIDEO BRONCHOSCOPY Bilateral 12/16/2014   Procedure: VIDEO BRONCHOSCOPY WITH FLUORO;  Surgeon: Collene Gobble, MD;  Location: [facility];  Service: Cardiopulmonary;  Laterality: Bilateral;   VIDEO BRONCHOSCOPY N/A 01/14/2015   Procedure: VIDEO BRONCHOSCOPY;  Surgeon: Grace Isaac, MD;  Location: [facility];  Service: Thoracic;  Laterality: N/A;    REVIEW OF SYSTEMS:  A comprehensive review of systems was negative.   LABORATORY DATA: Lab Results  Component Value Date   WBC 9.4 07/23/2018   HGB 15.9 07/23/2018   HCT 46.5 07/23/2018   MCV 94.5  07/23/2018   PLT 280 07/23/2018      Chemistry      Component Value Date/Time   NA 137 07/23/2018 1139   NA 137 01/12/2017 0846   K 4.3 07/23/2018 1139   K 4.4 01/12/2017 0846   CL 109 07/23/2018 1139   CO2 17 (L) 07/23/2018 1139   CO2 18 (L) 01/12/2017 0846   BUN 15 07/23/2018 1139   BUN 15.3 01/12/2017 0846   CREATININE 1.02 07/23/2018 1139   CREATININE 1.0 01/12/2017 0846      Component Value Date/Time   CALCIUM 8.7 (L) 07/23/2018 1139   CALCIUM 9.0 01/12/2017 0846   ALKPHOS 106 07/23/2018 1139   ALKPHOS 99 01/12/2017 0846   AST 29 07/23/2018 1139   AST 23 01/12/2017 0846   ALT 20 07/23/2018 1139   ALT 22 01/12/2017 0846   BILITOT 0.3 07/23/2018 1139   BILITOT 0.41 01/12/2017 0846       RADIOGRAPHIC STUDIES: Ct Chest W Contrast  Result Date: 07/23/2018 CLINICAL DATA:  Restaging of left lower lobe lung adenocarcinoma EXAM: CT CHEST, ABDOMEN, AND PELVIS WITH CONTRAST TECHNIQUE: Multidetector CT imaging of the chest, abdomen and pelvis was performed following the standard protocol during bolus administration of intravenous contrast. CONTRAST:  127m OMNIPAQUE  IOHEXOL 300 MG/ML  SOLN COMPARISON:  Multiple exams, including 03/02/2018 FINDINGS: CT CHEST FINDINGS Cardiovascular: Atherosclerotic calcification proximally in the left anterior descending coronary artery, and also in the aortic arch. Mediastinum/Nodes: Distal esophageal circumferential wall thickening. Although nonspecific most likely causes esophagitis. Lungs/Pleura: Centrilobular emphysema. Mild scarring in the right middle lobe. Stable left lower lobe scarring and postoperative findings. This includes an area of scarring measuring 0.8 cm in thickness on image 108/7, previously 0.9 cm. Stable 6 by 5 mm left lower lobe nodule in the costophrenic sulcus, image 160/7. Musculoskeletal: Old healed right rib deformities. CT ABDOMEN PELVIS FINDINGS Hepatobiliary: Mildly contracted gallbladder. Otherwise unremarkable appearance  of the liver. Pancreas: Unremarkable Spleen: Unremarkable Adrenals/Urinary Tract: Stable nonspecific 8 mm hypodense lesion of the right kidney upper pole posteriorly. Adrenal glands normal. Urinary bladder unremarkable. Stomach/Bowel: Sigmoid colon diverticulosis. Vascular/Lymphatic: Aortoiliac atherosclerotic vascular disease. Reproductive: Upper normal size of the prostate gland. Other: No supplemental non-categorized findings. Musculoskeletal: Degenerative disc disease at L5-S1. IMPRESSION: 1. Stable postoperative findings in the left lower lobe along with some mild nodularity along scarring/volume loss, as well as a 6 mm left lower lobe pulmonary nodule. Both findings are stable to minimally reduced in prominence over the last 2 years, and likely benign. 2. Aortic Atherosclerosis (ICD10-I70.0) and Emphysema (ICD10-J43.9). Coronary atherosclerosis. 3. Sigmoid colon diverticulosis. Electronically Signed   By: Van Clines M.D.   On: 07/23/2018 13:51   Ct Abdomen Pelvis W Contrast  Result Date: 07/23/2018 CLINICAL DATA:  Restaging of left lower lobe lung adenocarcinoma EXAM: CT CHEST, ABDOMEN, AND PELVIS WITH CONTRAST TECHNIQUE: Multidetector CT imaging of the chest, abdomen and pelvis was performed following the standard protocol during bolus administration of intravenous contrast. CONTRAST:  131m OMNIPAQUE IOHEXOL 300 MG/ML  SOLN COMPARISON:  Multiple exams, including 03/02/2018 FINDINGS: CT CHEST FINDINGS Cardiovascular: Atherosclerotic calcification proximally in the left anterior descending coronary artery, and also in the aortic arch. Mediastinum/Nodes: Distal esophageal circumferential wall thickening. Although nonspecific most likely causes esophagitis. Lungs/Pleura: Centrilobular emphysema. Mild scarring in the right middle lobe. Stable left lower lobe scarring and postoperative findings. This includes an area of scarring measuring 0.8 cm in thickness on image 108/7, previously 0.9 cm. Stable 6  by 5 mm left lower lobe nodule in the costophrenic sulcus, image 160/7. Musculoskeletal: Old healed right rib deformities. CT ABDOMEN PELVIS FINDINGS Hepatobiliary: Mildly contracted gallbladder. Otherwise unremarkable appearance of the liver. Pancreas: Unremarkable Spleen: Unremarkable Adrenals/Urinary Tract: Stable nonspecific 8 mm hypodense lesion of the right kidney upper pole posteriorly. Adrenal glands normal. Urinary bladder unremarkable. Stomach/Bowel: Sigmoid colon diverticulosis. Vascular/Lymphatic: Aortoiliac atherosclerotic vascular disease. Reproductive: Upper normal size of the prostate gland. Other: No supplemental non-categorized findings. Musculoskeletal: Degenerative disc disease at L5-S1. IMPRESSION: 1. Stable postoperative findings in the left lower lobe along with some mild nodularity along scarring/volume loss, as well as a 6 mm left lower lobe pulmonary nodule. Both findings are stable to minimally reduced in prominence over the last 2 years, and likely benign. 2. Aortic Atherosclerosis (ICD10-I70.0) and Emphysema (ICD10-J43.9). Coronary atherosclerosis. 3. Sigmoid colon diverticulosis. Electronically Signed   By: WVan ClinesM.D.   On: 07/23/2018 13:51    ASSESSMENT AND PLAN: This is a very pleasant 58years old white male with metastatic non-small cell lung cancer, adenocarcinoma with positive PDL 1 expression of 100%. He completed treatment with Ketruda (pembrolizumab) 200 mg IV every 3 weeks, status post 35 cycles, a total of 2 years. The patient is currently on observation and he is feeling fine with  no concerning complaints. He had repeat CT scan of the chest, abdomen pelvis performed recently.  I personally and independently reviewed the scans and discussed the results with the patient today. His scan showed no concerning findings for disease progression. I recommended for the patient to continue on observation for now. I will see him back for follow-up visit in 4 months  for evaluation and repeat CT scan of the chest, abdomen and pelvis. The patient was advised to call immediately if he has any concerning symptoms in the interval. I discussed the assessment and treatment plan with the patient. The patient was provided an opportunity to ask questions and all were answered. The patient agreed with the plan and demonstrated an understanding of the instructions.   The patient was advised to call back or seek an in-person evaluation if the symptoms worsen or if the condition fails to improve as anticipated.  I provided 12 minutes of non face-to-face telephone visit time during this encounter, and > 50% was spent counseling as documented under my assessment & plan.  Eilleen Kempf, MD 07/25/2018 10:12 AM  Disclaimer: This note was dictated with voice recognition software. Similar sounding words can inadvertently be transcribed and may not be corrected upon review.

## 2018-08-17 ENCOUNTER — Telehealth: Payer: Self-pay

## 2018-08-17 NOTE — Telephone Encounter (Signed)
Received TC from patient requesting a f/u visit after his MRI on the 15th. Patient stated that he would like to have a phone visit with Dr Mickeal Skinner on the 17th. Sent message to Dr Mickeal Skinner about f/u request after MRI and let patient know that I would get back to him about appointment when I hear back from Dr Mickeal Skinner.

## 2018-08-24 ENCOUNTER — Other Ambulatory Visit: Payer: Self-pay | Admitting: Internal Medicine

## 2018-08-24 DIAGNOSIS — G4700 Insomnia, unspecified: Secondary | ICD-10-CM

## 2018-09-25 ENCOUNTER — Ambulatory Visit
Admission: RE | Admit: 2018-09-25 | Discharge: 2018-09-25 | Disposition: A | Discharge status: Home / Self Care | Payer: Medicare Other | Source: Ambulatory Visit | Attending: Internal Medicine | Admitting: Internal Medicine

## 2018-09-25 ENCOUNTER — Other Ambulatory Visit: Payer: Self-pay

## 2018-09-25 DIAGNOSIS — C3490 Malignant neoplasm of unspecified part of unspecified bronchus or lung: Secondary | ICD-10-CM | POA: Diagnosis not present

## 2018-09-25 MED ORDER — GADOBENATE DIMEGLUMINE 529 MG/ML IV SOLN
17.0000 mL | Freq: Once | INTRAVENOUS | Status: AC | PRN
  Administered 2018-09-25: 17 mL via INTRAVENOUS

## 2018-09-26 ENCOUNTER — Telehealth: Payer: Self-pay | Admitting: Internal Medicine

## 2018-09-26 NOTE — Telephone Encounter (Signed)
Confirmed phone visit for 9/17 with pt and verified demographics

## 2018-09-27 ENCOUNTER — Other Ambulatory Visit: Payer: Self-pay | Admitting: Radiation Therapy

## 2018-09-27 ENCOUNTER — Inpatient Hospital Stay: Payer: Medicare Other | Attending: Internal Medicine | Admitting: Internal Medicine

## 2018-09-27 ENCOUNTER — Telehealth: Payer: Self-pay | Admitting: Internal Medicine

## 2018-09-27 DIAGNOSIS — C7931 Secondary malignant neoplasm of brain: Secondary | ICD-10-CM | POA: Diagnosis not present

## 2018-09-27 NOTE — Progress Notes (Signed)
I connected with Timothy Obrien on 09/27/18 at  9:00 AM EDT by telephone visit and verified that I am speaking with the correct person using two identifiers.  I discussed the limitations, risks, security and privacy concerns of performing an evaluation and management service by telemedicine and the availability of in-person appointments. I also discussed with the patient that there may be a patient responsible charge related to this service. The patient expressed understanding and agreed to proceed.  Other persons participating in the visit and their role in the encounter:  n/a  Patient's location:  Home  Provider's location:  Office  Chief Complaint:  Brain metastasis, right leg weakness  History of Present Ilness: No recent clinical changes, right leg continues to interfere with ambulation although he is mostly independent.  Hasn't been compliant with PT exercises.   Observations: Cognition and language normal  Imaging:  [facility] Clinician Interpretation: I have personally reviewed the CNS images as listed.  My interpretation, in the context of the patient's clinical presentation, is stable disease  Timothy Obrien Wo Contrast  Result Date: 09/25/2018 CLINICAL DATA:  Metastatic non-small cell lung cancer. History of posterior left frontal metastasis status post stereotactic radiosurgery and resection in 2016. 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 EXAM: MRI HEAD WITHOUT AND WITH CONTRAST TECHNIQUE: Multiplanar, multiecho pulse sequences of the brain and surrounding structures were obtained without and with intravenous contrast. CONTRAST:  55mL MULTIHANCE GADOBENATE DIMEGLUMINE 529 MG/ML IV SOLN COMPARISON:  03/16/2018 FINDINGS: BRAIN New Lesions: None. Larger lesions: None. Stable or Smaller lesions: 1. Irregular, non-masslike enhancement in the posterior left frontal lobe at the resection site measures 14 x 6 mm on axial images (series 11, image 135), stable to minimally  decreased in overall volume compared to the prior study. Mild edema in the adjacent white matter is unchanged, and chronic blood products are noted. Other Brain findings: There is no acute infarct, intracranial mass effect, or extra-axial fluid collection. The ventricles are normal in size. Vascular: Major intracranial vascular flow voids are preserved. Skull and upper cervical spine: Left frontoparietal craniotomy. No suspicious marrow lesion. Sinuses/Orbits: Unremarkable orbits. Mild right maxillary sinus mucosal thickening. Clear mastoid air cells. Other: None. IMPRESSION: 1. Stable post treatment changes in the posterior left frontal lobe without evidence of local tumor recurrence. 2. No evidence of new intracranial metastases or acute abnormality. Electronically Signed   By: Logan Bores M.D.   On: 09/25/2018 16:49    Assessment and Plan: Clinically and radiographically stable.  Needs re-engagement with physical therapy exercises Follow Up Instructions: RTC after repeat MRI brain in 6 months  I discussed the assessment and treatment plan with the patient.  The patient was provided an opportunity to ask questions and all were answered.  The patient agreed with the plan and demonstrated understanding of the instructions.    The patient was advised to call back or seek an in-person evaluation if the symptoms worsen or if the condition fails to improve as anticipated.  I provided 5-10 minutes of non-face-to-face time during this enocunter.  Ventura Sellers, MD   I provided 10 minutes of non face-to-face telephone visit time during this encounter, and > 50% was spent counseling as documented under my assessment & plan.

## 2018-09-27 NOTE — Telephone Encounter (Signed)
Scheduled appt per 9/17 los.  Spoke with patient and he is aware of his appt date and time.

## 2018-10-01 ENCOUNTER — Inpatient Hospital Stay: Payer: Medicare Other

## 2018-10-02 ENCOUNTER — Other Ambulatory Visit: Payer: Self-pay | Admitting: *Deleted

## 2018-10-02 DIAGNOSIS — C7931 Secondary malignant neoplasm of brain: Secondary | ICD-10-CM

## 2018-10-11 IMAGING — MR MR HEAD WO/W CM
11 series · 48 of 48 positions shown · IV contrast (multihance)
Comparison: 07/07/2015 brain MRI and earlier.

CLINICAL DATA: 54-year-old male status post preoperative SRS to a
solitary brain left frontal brain metastasis due to non-small cell
lung cancer. SRS Treatment on 12/25/2014. Restaging. Subsequent
encounter.

EXAM:
MRI HEAD WITHOUT AND WITH CONTRAST
TECHNIQUE: Multiplanar, multiecho pulse sequences of the brain and surrounding
structures were obtained without and with intravenous contrast.
CONTRAST:  16mL MULTIHANCE GADOBENATE DIMEGLUMINE 529 MG/ML IV SOLN

[Series 2: FLAIR · sagittal · 3.0mm · 0.75mm/px · 3 of 39 slices shown (1 of 2)]
[im 1/39]
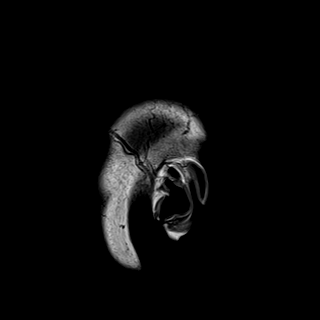
[im 20/39]
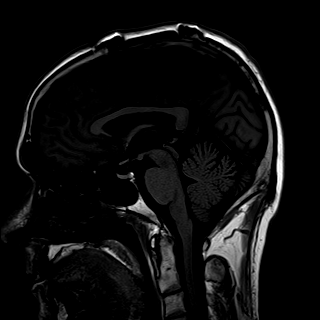
[im 39/39]
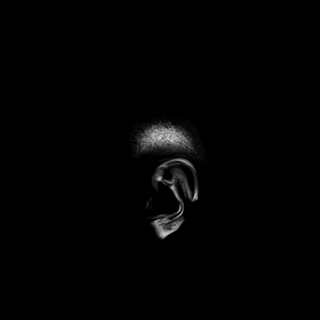

[Series 3: DWI · axial · 3.0mm · 1.50mm/px · z∈[-75,+73]mm · 5 of 78 slices shown (1 of 2)]
[im 1/78]
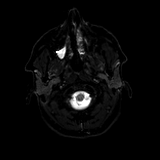
[im 20/78]
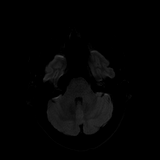
[im 39/78]
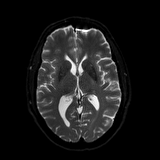
[im 58/78]
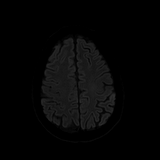
[im 78/78]
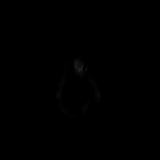

[Series 4: DWI · axial · 3.0mm · 1.50mm/px · z∈[-75,+73]mm · 3 of 37 slices shown (2 of 2)]
[im 1/37]
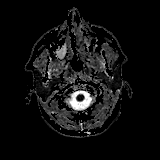
[im 19/37]
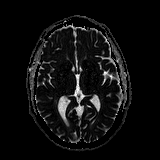
[im 37/37]
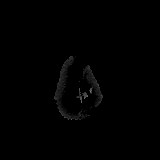

[Series 5: T2 · axial · 5.0mm · 0.57mm/px · z∈[-90,+78]mm · 2 of 29 slices shown]
[im 1/29]
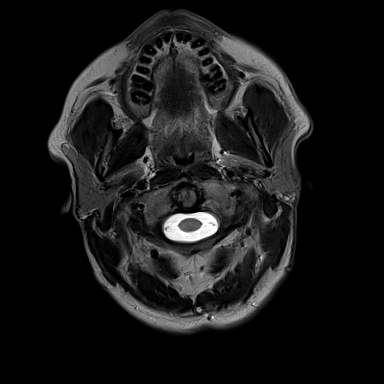
[im 29/29]
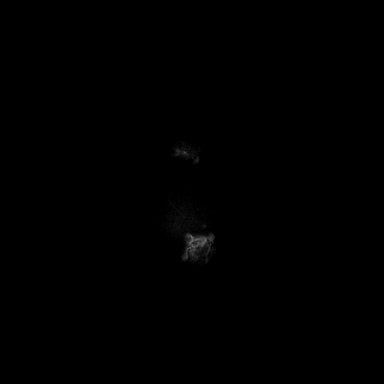

[Series 6: GRE · axial · 5.0mm · 0.57mm/px · z∈[-90,+78]mm · 2 of 29 slices shown]
[im 1/29]
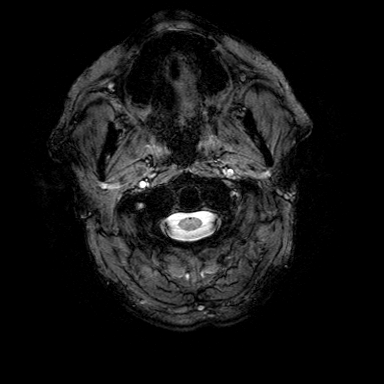
[im 29/29]
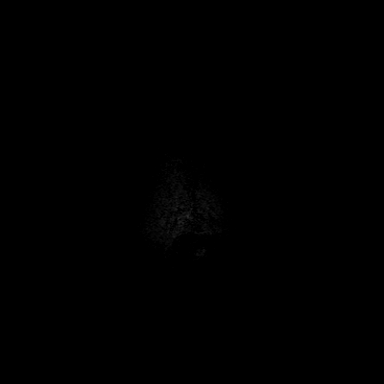

[Series 7: FLAIR · axial · 5.0mm · 0.57mm/px · z∈[-75,+93]mm · 2 of 29 slices shown (2 of 2)]
[im 1/29]
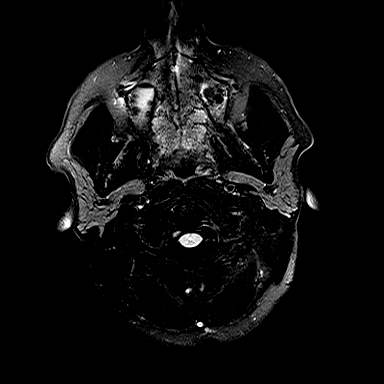
[im 29/29]
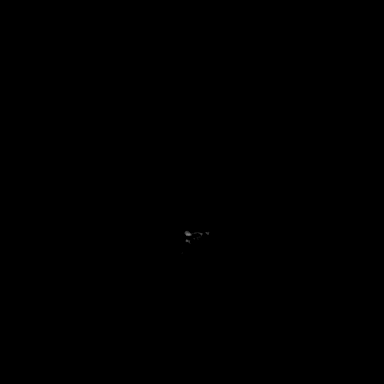

[Series 8: T1 · axial · 1.0mm · 0.75mm/px · z∈[-70,+89]mm · 11 of 160 slices shown]
[im 1/160]
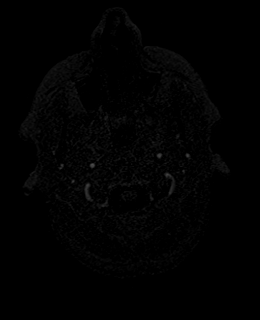
[im 16/160]
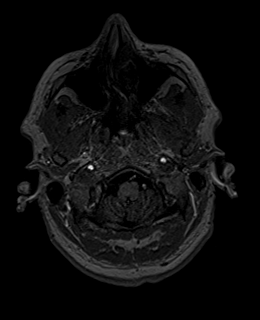
[im 32/160]
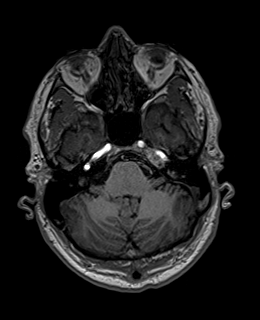
[im 48/160]
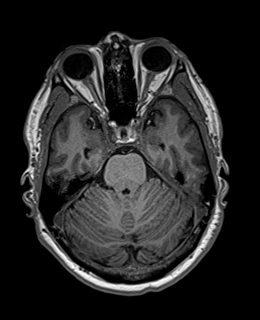
[im 64/160]
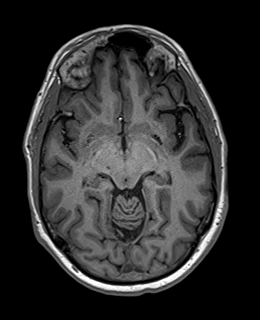
[im 80/160]
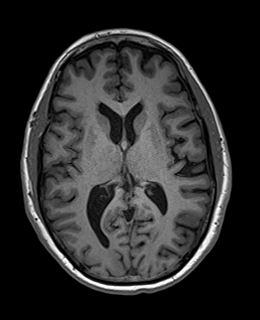
[im 96/160]
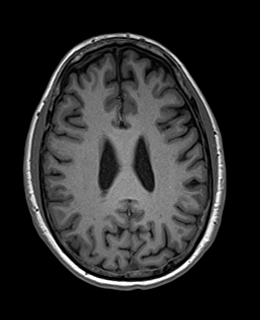
[im 112/160]
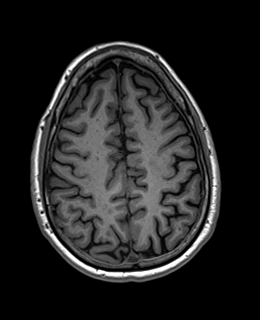
[im 128/160]
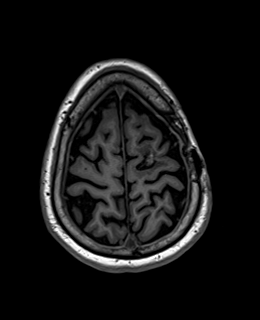
[im 144/160]
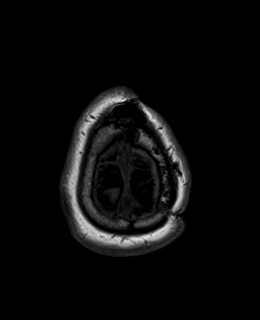
[im 160/160]
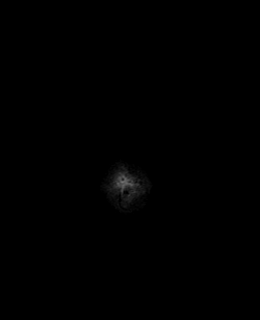

[Series 9: T2 post-contrast · coronal · 3.0mm · 0.57mm/px · 3 of 49 slices shown]
[im 1/49]
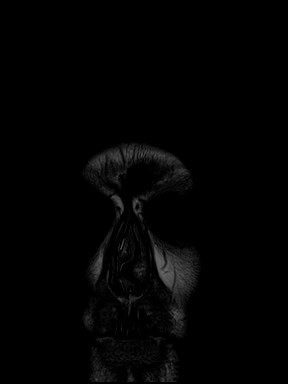
[im 25/49]
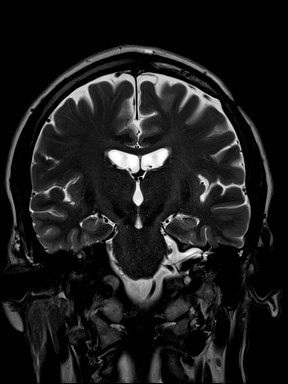
[im 49/49]
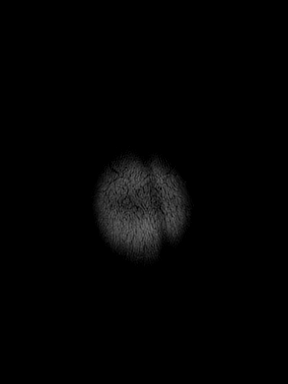

[Series 10: T1 post-contrast · axial · 1.0mm · 0.75mm/px · z∈[-70,+89]mm · 11 of 160 slices shown (1 of 2)]
[im 1/160]
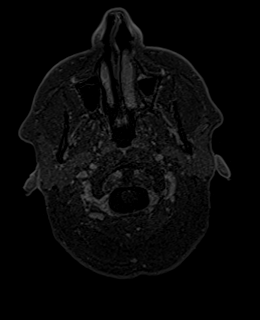
[im 16/160]
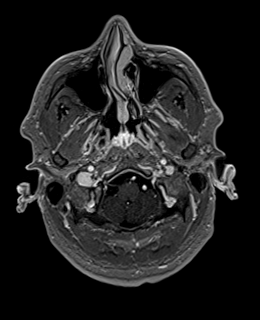
[im 32/160]
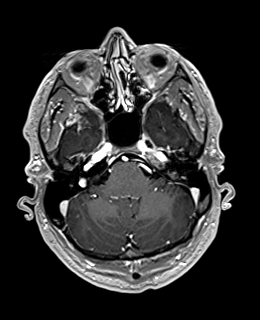
[im 48/160]
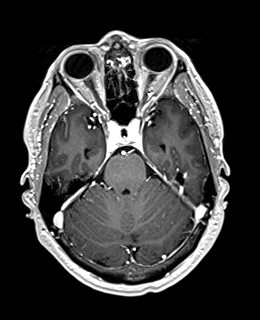
[im 64/160]
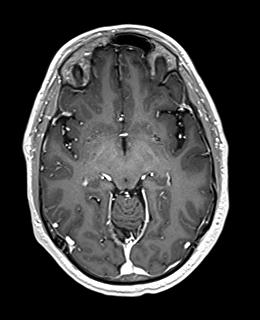
[im 80/160]
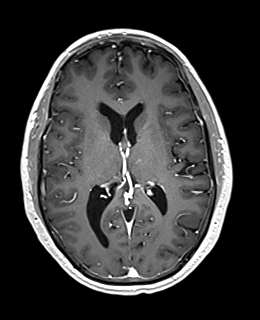
[im 96/160]
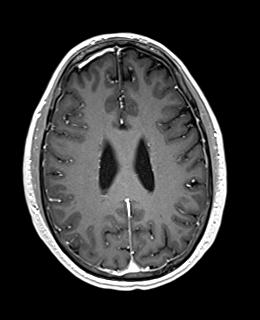
[im 112/160]
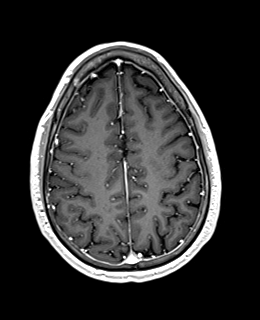
[im 128/160]
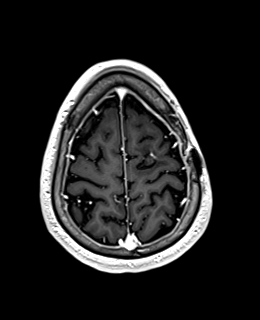
[im 144/160]
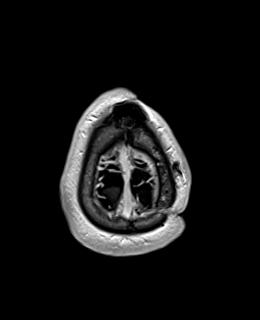
[im 160/160]
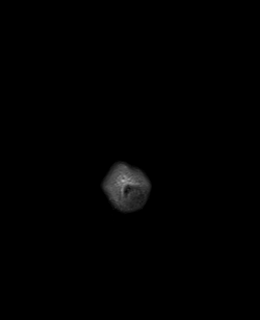

[Series 11: T1 post-contrast · coronal · 3.0mm · 0.57mm/px · 3 of 49 slices shown (2 of 2)]
[im 1/49]
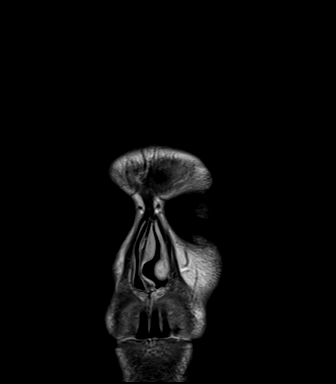
[im 25/49]
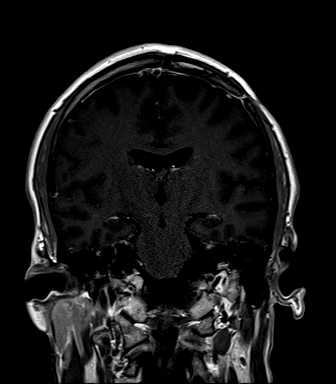
[im 49/49]
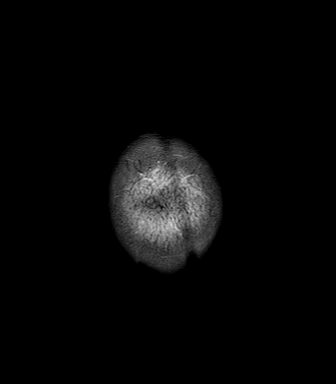

[Series 12: FLAIR post-contrast · sagittal · 3.0mm · 0.75mm/px · 3 of 39 slices shown]
[im 1/39]
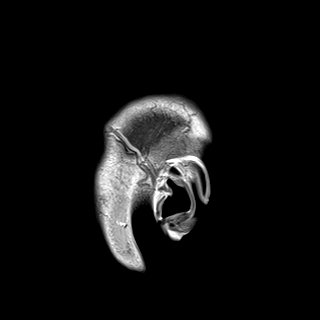
[im 20/39]
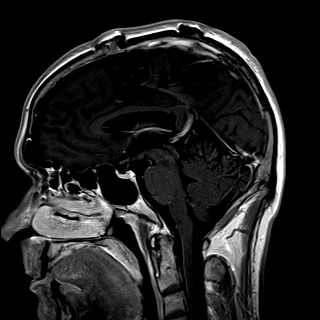
[im 39/39]
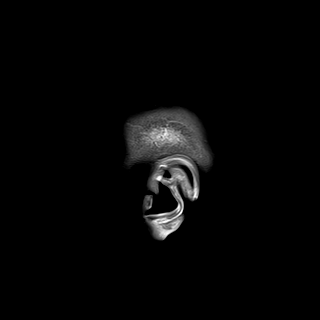

[48 of 48 positions shown; findings below may reference images not displayed]

FINDINGS: Brain: Left superior frontal gyrus treatment site demonstrates a
small cystic cavity with surrounding T2 and FLAIR hyperintensity.
Small volume hemosiderin. No associated mass effect. The resection
cavity size is stable. There is associated intrinsic T1
hyperintensity about the cavity, such that much of the post-contrast
increased T1 signal does not reflect genuine enhancement. There is
is minimal nodular enhancement along the anterior inferior aspect
measuring 3 mm (series 10, image 125) which is not definitely
changed.

Overlying craniotomy.

No other abnormal intracranial enhancement.

Stable gray and white matter signal elsewhere. No restricted
diffusion to suggest acute infarction. No midline shift,
ventriculomegaly, extra-axial collection or acute intracranial
hemorrhage. Cervicomedullary junction and pituitary are within
normal limits.

Vascular: Major intracranial vascular flow voids are stable.

Skull and upper cervical spine: Negative visualized cervical spinal
cord. Visible bone marrow signal is within normal limits.

Sinuses/Orbits: Orbits soft tissues appear stable and normal. Stable
paranasal sinuses, mild maxillary retention cysts.

Other: Visible internal auditory structures appear normal. Mastoids
are clear. No acute scalp soft tissue findings.
IMPRESSION: 1. Continued satisfactory post treatment appearance of the solitary
left superior frontal gyrus metastasis. Attention directed on
followup to the small 3 mm nodular area of enhancement along the
anterior inferior resection cavity.
2. No new metastatic disease or new intracranial abnormality
identified.

## 2018-10-16 DIAGNOSIS — Z23 Encounter for immunization: Secondary | ICD-10-CM | POA: Diagnosis not present

## 2018-11-19 ENCOUNTER — Telehealth: Payer: Self-pay | Admitting: Internal Medicine

## 2018-11-19 ENCOUNTER — Other Ambulatory Visit: Payer: Self-pay | Admitting: Internal Medicine

## 2018-11-19 DIAGNOSIS — G4700 Insomnia, unspecified: Secondary | ICD-10-CM

## 2018-11-19 NOTE — Telephone Encounter (Signed)
Returned patient's phone call regarding scheduling a lab appointment for CT scan, per patient's request it has been added. Left a voicemail.

## 2018-11-27 ENCOUNTER — Telehealth: Payer: Self-pay | Admitting: Internal Medicine

## 2018-11-27 ENCOUNTER — Other Ambulatory Visit: Payer: Self-pay

## 2018-11-27 ENCOUNTER — Inpatient Hospital Stay: Payer: Medicare Other | Attending: Internal Medicine

## 2018-11-27 ENCOUNTER — Encounter (HOSPITAL_COMMUNITY): Payer: Self-pay

## 2018-11-27 ENCOUNTER — Ambulatory Visit (HOSPITAL_COMMUNITY)
Admission: RE | Admit: 2018-11-27 | Discharge: 2018-11-27 | Disposition: A | Discharge status: Home / Self Care | Payer: Medicare Other | Source: Ambulatory Visit | Attending: Internal Medicine | Admitting: Internal Medicine

## 2018-11-27 DIAGNOSIS — C3490 Malignant neoplasm of unspecified part of unspecified bronchus or lung: Secondary | ICD-10-CM | POA: Insufficient documentation

## 2018-11-27 DIAGNOSIS — C3432 Malignant neoplasm of lower lobe, left bronchus or lung: Secondary | ICD-10-CM | POA: Diagnosis not present

## 2018-11-27 DIAGNOSIS — C7931 Secondary malignant neoplasm of brain: Secondary | ICD-10-CM | POA: Insufficient documentation

## 2018-11-27 DIAGNOSIS — S2231XA Fracture of one rib, right side, initial encounter for closed fracture: Secondary | ICD-10-CM | POA: Diagnosis not present

## 2018-11-27 LAB — CBC WITH DIFFERENTIAL (CANCER CENTER ONLY)
Abs Immature Granulocytes: 0.02 10*3/uL (ref 0.00–0.07)
Basophils Absolute: 0.1 10*3/uL (ref 0.0–0.1)
Basophils Relative: 1 %
Eosinophils Absolute: 0.1 10*3/uL (ref 0.0–0.5)
Eosinophils Relative: 1 %
HCT: 46.5 % (ref 39.0–52.0)
Hemoglobin: 15.8 g/dL (ref 13.0–17.0)
Immature Granulocytes: 0 %
Lymphocytes Relative: 19 %
Lymphs Abs: 1.4 10*3/uL (ref 0.7–4.0)
MCH: 31.9 pg (ref 26.0–34.0)
MCHC: 34 g/dL (ref 30.0–36.0)
MCV: 93.8 fL (ref 80.0–100.0)
Monocytes Absolute: 0.8 10*3/uL (ref 0.1–1.0)
Monocytes Relative: 11 %
Neutro Abs: 5.3 10*3/uL (ref 1.7–7.7)
Neutrophils Relative %: 68 %
Platelet Count: 295 10*3/uL (ref 150–400)
RBC: 4.96 MIL/uL (ref 4.22–5.81)
RDW: 12.8 % (ref 11.5–15.5)
WBC Count: 7.7 10*3/uL (ref 4.0–10.5)
nRBC: 0 % (ref 0.0–0.2)

## 2018-11-27 LAB — CMP (CANCER CENTER ONLY)
ALT: 17 U/L (ref 0–44)
AST: 25 U/L (ref 15–41)
Albumin: 4.1 g/dL (ref 3.5–5.0)
Alkaline Phosphatase: 107 U/L (ref 38–126)
Anion gap: 14 (ref 5–15)
BUN: 16 mg/dL (ref 6–20)
CO2: 19 mmol/L — ABNORMAL LOW (ref 22–32)
Calcium: 8.9 mg/dL (ref 8.9–10.3)
Chloride: 102 mmol/L (ref 98–111)
Creatinine: 1.07 mg/dL (ref 0.61–1.24)
GFR, Est AFR Am: >60 mL/min (ref >60)
GFR, Estimated: >60 mL/min (ref >60)
Glucose, Bld: 90 mg/dL (ref 70–99)
Potassium: 4.6 mmol/L (ref 3.5–5.1)
Sodium: 135 mmol/L (ref 135–145)
Total Bilirubin: 0.4 mg/dL (ref 0.3–1.2)
Total Protein: 7.2 g/dL (ref 6.5–8.1)

## 2018-11-27 MED ORDER — IOHEXOL 9 MG/ML PO SOLN
ORAL | Status: AC
  Administered 2018-11-27: 11:00:00 1000 mL via ORAL
  Filled 2018-11-27: qty 1000

## 2018-11-27 MED ORDER — IOHEXOL 300 MG/ML  SOLN
100.0000 mL | Freq: Once | INTRAMUSCULAR | Status: AC | PRN
  Administered 2018-11-27: 100 mL via INTRAVENOUS

## 2018-11-27 MED ORDER — IOHEXOL 9 MG/ML PO SOLN: 1000.0000 mL | ORAL | Status: AC

## 2018-11-27 MED ORDER — SODIUM CHLORIDE (PF) 0.9 % IJ SOLN
INTRAMUSCULAR | Status: AC
  Filled 2018-11-27: qty 50

## 2018-11-27 NOTE — Telephone Encounter (Signed)
Returned patient's phone call regarding scheduling an appointment, informed patient I will need further assistance from provider and he will get a call back.

## 2018-11-28 ENCOUNTER — Telehealth: Payer: Self-pay | Admitting: Internal Medicine

## 2018-11-28 ENCOUNTER — Telehealth: Payer: Self-pay | Admitting: *Deleted

## 2018-11-28 NOTE — Telephone Encounter (Signed)
Called patient regarding upcoming scheduled appointment, left a voicemail.

## 2018-11-28 NOTE — Telephone Encounter (Signed)
Notified pt to arrive at [facility] for CT chest 1pm appt.  Pt verbalized understanding. No further concerns.

## 2018-11-29 ENCOUNTER — Encounter (HOSPITAL_COMMUNITY): Payer: Self-pay

## 2018-11-29 ENCOUNTER — Other Ambulatory Visit: Payer: Self-pay

## 2018-11-29 ENCOUNTER — Ambulatory Visit (HOSPITAL_COMMUNITY)
Admission: RE | Admit: 2018-11-29 | Discharge: 2018-11-29 | Disposition: A | Discharge status: Home / Self Care | Payer: Medicare Other | Source: Ambulatory Visit | Attending: Internal Medicine | Admitting: Internal Medicine

## 2018-11-29 DIAGNOSIS — C3490 Malignant neoplasm of unspecified part of unspecified bronchus or lung: Secondary | ICD-10-CM | POA: Insufficient documentation

## 2018-11-29 MED ORDER — SODIUM CHLORIDE (PF) 0.9 % IJ SOLN
INTRAMUSCULAR | Status: AC
  Filled 2018-11-29: qty 50

## 2018-11-29 MED ORDER — IOHEXOL 300 MG/ML  SOLN
75.0000 mL | Freq: Once | INTRAMUSCULAR | Status: AC | PRN
  Administered 2018-11-29: 75 mL via INTRAVENOUS

## 2018-12-03 ENCOUNTER — Encounter: Payer: Self-pay | Admitting: Internal Medicine

## 2018-12-03 ENCOUNTER — Inpatient Hospital Stay (HOSPITAL_BASED_OUTPATIENT_CLINIC_OR_DEPARTMENT_OTHER): Payer: Medicare Other | Admitting: Internal Medicine

## 2018-12-03 DIAGNOSIS — C3432 Malignant neoplasm of lower lobe, left bronchus or lung: Secondary | ICD-10-CM

## 2018-12-03 DIAGNOSIS — C7931 Secondary malignant neoplasm of brain: Secondary | ICD-10-CM

## 2018-12-03 DIAGNOSIS — Z720 Tobacco use: Secondary | ICD-10-CM

## 2018-12-03 DIAGNOSIS — C3490 Malignant neoplasm of unspecified part of unspecified bronchus or lung: Secondary | ICD-10-CM | POA: Diagnosis not present

## 2018-12-03 NOTE — Progress Notes (Signed)
[place] Telephone:(336) [phone]   Fax:(336) [phone]  PROGRESS NOTE FOR TELEMEDICINE VISITS  [name], MD [facility] [place] [address]  I connected [phone] on 12/03/18 at  3:15 PM EST by telephone visit and verified that I am speaking with the correct person using two identifiers.   I discussed the limitations, risks, security and privacy concerns of performing an evaluation and management service by telemedicine and the availability of in-person appointments. I also discussed with the patient that there may be a patient responsible charge related to this service. The patient expressed understanding and agreed to proceed.  Other persons participating in the visit and their role in the encounter:  None  Patient's location: Home Provider's location: [facility]  DIAGNOSIS:Stage IV (T2a, N1, M1b) non-small cell lung cancer, adenocarcinoma, negative EGFR mutation but equivocal EGFR amplification, negative ALK gene translocation and negative ROS 1 but with PDL-1 expression 100%presented with left lower lobe lung mass in addition to left hilar adenopathy and solitary metastatic brain lesion diagnosed in December 2016.  PRIOR THERAPY: 1) status post stereotactic radiotherapy and surgical resection diagnosed in December 2016. 2) status post video bronchoscopy with left VATS and wedge resection of the left upper lobe and left lower lobe superior segmentectomy with lymph node dissection under the care of Dr. Servando Snare on 01/14/2015. 3) First-line treatment with immunotherapy with Ketruda (pembrolizumab) 200 mg IV every 3 weeks status post 35 cycles. First cycle was given 02/18/2015. Discontinued after the patient completed 2 years of treatment. 4) stereotactic left frontal craniotomy for resection of tumor and the final pathology was consistent with tumor necrosis. This was done on 05/12/2017  CURRENT THERAPY: Observation.  INTERVAL  HISTORY: Timothy Obrien 58 y.o. male has a telephone virtual visit with me today for evaluation and recommendation regarding his condition and the scan results.  The patient is feeling fine today with no concerning complaints.  He denied having any current chest pain, shortness of breath, cough or hemoptysis.  He denied having any fever or chills.  He has no nausea, vomiting, diarrhea or constipation.  He has no headache or visual changes.  He is completely off steroids at this point.  He is currently on Keppra 250 mg p.o. twice daily.  The patient is followed by Dr. Mickeal Skinner for his brain metastasis.  He has no recent weight loss or night sweats.  He had repeat CT scan of the chest, abdomen pelvis performed recently and he is here for evaluation and discussion of his discuss results.  MEDICAL HISTORY: Past Medical History:  Diagnosis Date   Anxiety    Bronchitis    Chronic fatigue 09/17/2015   Depression 02/11/2016   Hypertension 12/31/2015   states never been told   Lung cancer ([place]) dx'd 2016   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   Pneumonia    Seizures ([place])    last 12/11/14   Shortness of breath dyspnea    with exertion   Tobacco abuse     ALLERGIES:  has No Known Allergies.  MEDICATIONS:  Current Outpatient Medications  Medication Sig Dispense Refill   acetaminophen (TYLENOL) 500 MG tablet Take 500 mg by mouth 2 (two) times daily as needed.     acetaminophen-codeine (TYLENOL #3) 300-30 MG tablet Take 1 tablet by mouth every 6 (six) hours as needed for moderate pain. (Patient not taking: Reported on 05/23/2017) 60 tablet 0   ALPRAZolam (XANAX) 0.25 MG tablet Take  0.25 mg by mouth daily as needed for anxiety.      ibuprofen (ADVIL,MOTRIN) 600 MG tablet Take 600 mg by mouth 2 (two) times daily as needed.     levETIRAcetam (KEPPRA) 500 MG tablet TAKE 1 TABLET BY MOUTH EVERY 12 HOURS 180 tablet 1   mirtazapine (REMERON) 30 MG tablet TAKE 1 TABLET BY MOUTH EVERYDAY AT  BEDTIME 90 tablet 0   pantoprazole (PROTONIX) 40 MG tablet Take 1 tablet (40 mg total) by mouth daily. 30 tablet 3   No current facility-administered medications for this visit.     SURGICAL HISTORY:  Past Surgical History:  Procedure Laterality Date   APPLICATION OF CRANIAL NAVIGATION N/A 12/26/2014   Procedure: APPLICATION OF CRANIAL NAVIGATION;  Surgeon: Kevan Ny Ditty, MD;  Location: [facility];  Service: Neurosurgery;  Laterality: N/A;   APPLICATION OF CRANIAL NAVIGATION Left 05/12/2017   Procedure: APPLICATION OF CRANIAL NAVIGATION;  Surgeon: Consuella Lose, MD;  Location: [facility];  Service: Neurosurgery;  Laterality: Left;  APPLICATION OF CRANIAL NAVIGATION   CRANIOTOMY N/A 12/26/2014   Procedure: CRANIOTOMY TUMOR EXCISION with BrainLab;  Surgeon: Kevan Ny Ditty, MD;  Location: [facility];  Service: Neurosurgery;  Laterality: N/A;  CRANIOTOMY TUMOR EXCISION with [facility]   CRANIOTOMY Left 05/12/2017   Procedure: STEREOTACTIC LEFT FRONTOPERIETIAL CRANIOTOMY FOR RESECTION OF TUMOR;  Surgeon: Consuella Lose, MD;  Location: [facility];  Service: Neurosurgery;  Laterality: Left;  STEREOTACTIC LEFT FRONTOPERIETIAL CRANIOTOMY FOR RESECTION OF TUMOR   HERNIA REPAIR     VASECTOMY     VIDEO ASSISTED THORACOSCOPY (VATS)/WEDGE RESECTION Left 01/14/2015   Procedure: VIDEO ASSISTED THORACOSCOPY (VATS)/WEDGE RESECTION, Superior segmentectomy left  lower lobe, wedge resection of left upper lobe, multiple lymph node disection, On Q insertion.;  Surgeon: Grace Isaac, MD;  Location: [facility];  Service: Thoracic;  Laterality: Left;   VIDEO BRONCHOSCOPY Bilateral 12/16/2014   Procedure: VIDEO BRONCHOSCOPY WITH FLUORO;  Surgeon: Collene Gobble, MD;  Location: [facility];  Service: Cardiopulmonary;  Laterality: Bilateral;   VIDEO BRONCHOSCOPY N/A 01/14/2015   Procedure: VIDEO BRONCHOSCOPY;  Surgeon: Grace Isaac, MD;  [facility];  Service: Thoracic;  Laterality: N/A;     REVIEW OF SYSTEMS:  A comprehensive review of systems was negative.    LABORATORY DATA: Lab Results  Component Value Date   WBC 7.7 11/27/2018   HGB 15.8 11/27/2018   HCT 46.5 11/27/2018   MCV 93.8 11/27/2018   PLT 295 11/27/2018      Chemistry      Component Value Date/Time   NA 135 11/27/2018 1126   NA 137 01/12/2017 0846   K 4.6 11/27/2018 1126   K 4.4 01/12/2017 0846   CL 102 11/27/2018 1126   CO2 19 (L) 11/27/2018 1126   CO2 18 (L) 01/12/2017 0846   BUN 16 11/27/2018 1126   BUN 15.3 01/12/2017 0846   CREATININE 1.07 11/27/2018 1126   CREATININE 1.0 01/12/2017 0846      Component Value Date/Time   CALCIUM 8.9 11/27/2018 1126   CALCIUM 9.0 01/12/2017 0846   ALKPHOS 107 11/27/2018 1126   ALKPHOS 99 01/12/2017 0846   AST 25 11/27/2018 1126   AST 23 01/12/2017 0846   ALT 17 11/27/2018 1126   ALT 22 01/12/2017 0846   BILITOT 0.4 11/27/2018 1126   BILITOT 0.41 01/12/2017 0846       RADIOGRAPHIC STUDIES: Ct Chest W Contrast  Result Date: 11/29/2018 CLINICAL DATA:  Lung cancer follow-up. Chest CT and abdomen/pelvis  CT have different study dates as the patient was brought back CT imaging of the chest. EXAM: CT CHEST, ABDOMEN, AND PELVIS WITH CONTRAST TECHNIQUE: Multidetector CT imaging of the chest, abdomen and pelvis was performed following the standard protocol during bolus administration of intravenous contrast. CONTRAST:  145m OMNIPAQUE IOHEXOL 300 MG/ML SOLN; 745mOMNIPAQUE IOHEXOL 300 MG/ML SOLN COMPARISON:  07/23/2018 FINDINGS: CT CHEST FINDINGS Cardiovascular: The heart size is normal. No substantial pericardial effusion. Coronary artery calcification is evident. Atherosclerotic calcification is noted in the wall of the thoracic aorta. Mediastinum/Nodes: No mediastinal lymphadenopathy. There is no hilar lymphadenopathy. The esophagus has normal imaging features. There is no axillary lymphadenopathy. Lungs/Pleura: Centrilobular and paraseptal emphysema evident.  Stable post treatment scarring medial left lower lobe. The 6 mm nodule in the deep posterior left costophrenic sulcus identified previously is stable. No new suspicious nodule or mass. No focal airspace consolidation. No pleural effusion. Musculoskeletal: Nondisplaced lateral right seventh rib fracture shows no bony callus consistent with acute to subacute injury. No worrisome lytic or sclerotic osseous abnormality. CT ABDOMEN PELVIS FINDINGS Hepatobiliary: No suspicious focal abnormality within the liver parenchyma. There is no evidence for gallstones, gallbladder wall thickening, or pericholecystic fluid. No intrahepatic or extrahepatic biliary dilation. Pancreas: No focal mass lesion. No dilatation of the main duct. No intraparenchymal cyst. No peripancreatic edema. Spleen: No splenomegaly. No focal mass lesion. Adrenals/Urinary Tract: No adrenal nodule or mass. Kidneys unremarkable 7 mm low-density lesion upper pole right kidney is too small to characterize but stable and likely benign. No evidence for hydroureter. The urinary bladder appears normal for the degree of distention. Stomach/Bowel: Stomach is unremarkable. No gastric wall thickening. No evidence of outlet obstruction. Duodenum is normally positioned as is the ligament of Treitz. No small bowel wall thickening. No small bowel dilatation. The terminal ileum is normal. The appendix is normal. No gross colonic mass. No colonic wall thickening. Diverticular changes are noted in the left colon without evidence of diverticulitis. Vascular/Lymphatic: There is abdominal aortic atherosclerosis without aneurysm. There is no gastrohepatic or hepatoduodenal ligament lymphadenopathy. No intraperitoneal or retroperitoneal lymphadenopathy. No pelvic sidewall lymphadenopathy. Reproductive: Prostate gland is enlarged. Other: No intraperitoneal free fluid. Musculoskeletal: Left groin hernia contains only fat. Tiny fat containing umbilical hernia evident. No worrisome  lytic or sclerotic osseous abnormality. IMPRESSION: 1. Stable exam. No new or progressive findings in the chest, abdomen, or pelvis. 2. Postsurgical changes in the left lower lobe in the tiny nodule in the deep posterior left costophrenic sulcus are unchanged since prior. 3. Nondisplaced lateral right seventh rib fracture shows no bony callus consistent with acute to subacute injury. 4. Prostatomegaly. 5. Left groin hernia contains only fat. 6. Aortic Atherosclerosis (ICD10-I70.0) and Emphysema (ICD10-J43.9). Electronically Signed   By: ErMisty Stanley.D.   On: 11/29/2018 13:50   Ct Abdomen Pelvis W Contrast  Result Date: 11/29/2018 CLINICAL DATA:  Lung cancer follow-up. Chest CT and abdomen/pelvis CT have different study dates as the patient was brought back CT imaging of the chest. EXAM: CT CHEST, ABDOMEN, AND PELVIS WITH CONTRAST TECHNIQUE: Multidetector CT imaging of the chest, abdomen and pelvis was performed following the standard protocol during bolus administration of intravenous contrast. CONTRAST:  10073mMNIPAQUE IOHEXOL 300 MG/ML SOLN; 45m54mNIPAQUE IOHEXOL 300 MG/ML SOLN COMPARISON:  07/23/2018 FINDINGS: CT CHEST FINDINGS Cardiovascular: The heart size is normal. No substantial pericardial effusion. Coronary artery calcification is evident. Atherosclerotic calcification is noted in the wall of the thoracic aorta. Mediastinum/Nodes: No mediastinal lymphadenopathy. There is no hilar  lymphadenopathy. The esophagus has normal imaging features. There is no axillary lymphadenopathy. Lungs/Pleura: Centrilobular and paraseptal emphysema evident. Stable post treatment scarring medial left lower lobe. The 6 mm nodule in the deep posterior left costophrenic sulcus identified previously is stable. No new suspicious nodule or mass. No focal airspace consolidation. No pleural effusion. Musculoskeletal: Nondisplaced lateral right seventh rib fracture shows no bony callus consistent with acute to subacute injury.  No worrisome lytic or sclerotic osseous abnormality. CT ABDOMEN PELVIS FINDINGS Hepatobiliary: No suspicious focal abnormality within the liver parenchyma. There is no evidence for gallstones, gallbladder wall thickening, or pericholecystic fluid. No intrahepatic or extrahepatic biliary dilation. Pancreas: No focal mass lesion. No dilatation of the main duct. No intraparenchymal cyst. No peripancreatic edema. Spleen: No splenomegaly. No focal mass lesion. Adrenals/Urinary Tract: No adrenal nodule or mass. Kidneys unremarkable 7 mm low-density lesion upper pole right kidney is too small to characterize but stable and likely benign. No evidence for hydroureter. The urinary bladder appears normal for the degree of distention. Stomach/Bowel: Stomach is unremarkable. No gastric wall thickening. No evidence of outlet obstruction. Duodenum is normally positioned as is the ligament of Treitz. No small bowel wall thickening. No small bowel dilatation. The terminal ileum is normal. The appendix is normal. No gross colonic mass. No colonic wall thickening. Diverticular changes are noted in the left colon without evidence of diverticulitis. Vascular/Lymphatic: There is abdominal aortic atherosclerosis without aneurysm. There is no gastrohepatic or hepatoduodenal ligament lymphadenopathy. No intraperitoneal or retroperitoneal lymphadenopathy. No pelvic sidewall lymphadenopathy. Reproductive: Prostate gland is enlarged. Other: No intraperitoneal free fluid. Musculoskeletal: Left groin hernia contains only fat. Tiny fat containing umbilical hernia evident. No worrisome lytic or sclerotic osseous abnormality. IMPRESSION: 1. Stable exam. No new or progressive findings in the chest, abdomen, or pelvis. 2. Postsurgical changes in the left lower lobe in the tiny nodule in the deep posterior left costophrenic sulcus are unchanged since prior. 3. Nondisplaced lateral right seventh rib fracture shows no bony callus consistent with acute  to subacute injury. 4. Prostatomegaly. 5. Left groin hernia contains only fat. 6. Aortic Atherosclerosis (ICD10-I70.0) and Emphysema (ICD10-J43.9). Electronically Signed   By: Misty Stanley M.D.   On: 11/29/2018 13:50    ASSESSMENT AND PLAN: This is a very pleasant 59years old white male with metastatic non-small cell lung cancer, adenocarcinoma with positive PDL 1 expression of 100%. He completed treatment with Ketruda (pembrolizumab) 200 mg IV every 3 weeks, status post 35 cycles, a total of 2 years. The patient has been in observation since that time with no concerning complaints. He had repeat CT scan of the chest, abdomen pelvis performed recently.  I personally and independently reviewed the scans and discussed the results with the patient today. His scan showed no concerning findings for disease recurrence or progression. I recommended for the patient to continue on observation with repeat CT scan of the chest, abdomen pelvis in 6 months. He was advised to call immediately if he has any concerning symptoms in the interval. I discussed the assessment and treatment plan with the patient. The patient was provided an opportunity to ask questions and all were answered. The patient agreed with the plan and demonstrated an understanding of the instructions.   The patient was advised to call back or seek an in-person evaluation if the symptoms worsen or if the condition fails to improve as anticipated.  I provided 12 minutes of non face-to-face telephone visit time during this encounter, and > 50% was spent counseling as documented  under my assessment & plan.  Eilleen Kempf, MD 12/03/2018 3:20 PM  Disclaimer: This note was dictated with voice recognition software. Similar sounding words can inadvertently be transcribed and may not be corrected upon review.

## 2018-12-04 ENCOUNTER — Telehealth: Payer: Self-pay | Admitting: Internal Medicine

## 2018-12-04 NOTE — Telephone Encounter (Signed)
Scheduled per los. Called and left msg. Mailed printout  °

## 2018-12-20 DIAGNOSIS — M79671 Pain in right foot: Secondary | ICD-10-CM | POA: Diagnosis not present

## 2019-01-29 ENCOUNTER — Telehealth: Payer: Self-pay | Admitting: Internal Medicine

## 2019-01-29 NOTE — Telephone Encounter (Signed)
Returned patient's phone call regarding rescheduling an appointment, per patient's request appointment has moved to

## 2019-01-29 NOTE — Telephone Encounter (Signed)
Returned patient's phone call regarding rescheduling 03/16 appointment, per patient's request appointment has moved to 03/19.

## 2019-02-14 ENCOUNTER — Other Ambulatory Visit: Payer: Self-pay | Admitting: Internal Medicine

## 2019-02-14 DIAGNOSIS — G4700 Insomnia, unspecified: Secondary | ICD-10-CM

## 2019-02-19 ENCOUNTER — Other Ambulatory Visit: Payer: Self-pay | Admitting: Radiation Therapy

## 2019-03-26 ENCOUNTER — Ambulatory Visit: Payer: Medicare Other | Admitting: Internal Medicine

## 2019-03-26 ENCOUNTER — Other Ambulatory Visit: Payer: Medicare Other

## 2019-03-29 ENCOUNTER — Ambulatory Visit: Payer: Medicare Other | Admitting: Internal Medicine

## 2019-04-01 ENCOUNTER — Inpatient Hospital Stay: Payer: Medicare Other

## 2019-04-09 ENCOUNTER — Ambulatory Visit
Admission: RE | Admit: 2019-04-09 | Discharge: 2019-04-09 | Disposition: A | Discharge status: Home / Self Care | Payer: Medicare Other | Source: Ambulatory Visit | Attending: Internal Medicine | Admitting: Internal Medicine

## 2019-04-09 DIAGNOSIS — C7800 Secondary malignant neoplasm of unspecified lung: Secondary | ICD-10-CM | POA: Diagnosis not present

## 2019-04-09 DIAGNOSIS — C7931 Secondary malignant neoplasm of brain: Secondary | ICD-10-CM

## 2019-04-09 MED ORDER — GADOBENATE DIMEGLUMINE 529 MG/ML IV SOLN
17.0000 mL | Freq: Once | INTRAVENOUS | Status: AC | PRN
  Administered 2019-04-09: 17 mL via INTRAVENOUS

## 2019-04-10 ENCOUNTER — Inpatient Hospital Stay: Payer: Medicare Other | Attending: Internal Medicine

## 2019-04-11 ENCOUNTER — Inpatient Hospital Stay: Payer: Medicare Other | Attending: Internal Medicine | Admitting: Internal Medicine

## 2019-04-11 DIAGNOSIS — C7931 Secondary malignant neoplasm of brain: Secondary | ICD-10-CM | POA: Diagnosis not present

## 2019-04-11 DIAGNOSIS — R531 Weakness: Secondary | ICD-10-CM

## 2019-04-11 DIAGNOSIS — Z23 Encounter for immunization: Secondary | ICD-10-CM | POA: Diagnosis not present

## 2019-04-11 NOTE — Progress Notes (Signed)
I connected with Timothy Obrien on 04/11/19 at  9:00 AM EDT by telephone visit and verified that I am speaking with the correct person using two identifiers.  I discussed the limitations, risks, security and privacy concerns of performing an evaluation and management service by telemedicine and the availability of in-person appointments. I also discussed with the patient that there may be a patient responsible charge related to this service. The patient expressed understanding and agreed to proceed.  Other persons participating in the visit and their role in the encounter:  n/a  Patient's location:  Home  Provider's location:  Office  Chief Complaint:  Brain metastasis, right leg weakness  History of Present Ilness: No recent clinical changes, right leg continues to interfere with ambulation although he is mostly independent.  Has had a few falls recently.    Observations: Cognition and language normal  Imaging:  [facility] Clinician Interpretation: I have personally reviewed the CNS images as listed.  My interpretation, in the context of the patient's clinical presentation, is stable disease  MR Brain W Wo Contrast  Result Date: 04/09/2019 CLINICAL DATA:  Follow-up metastatic lung cancer with previous surgery and S RS. EXAM: MRI HEAD WITHOUT AND WITH CONTRAST TECHNIQUE: Multiplanar, multiecho pulse sequences of the brain and surrounding structures were obtained without and with intravenous contrast. CONTRAST:  96mL MULTIHANCE GADOBENATE DIMEGLUMINE 529 MG/ML IV SOLN COMPARISON:  Most recent examination 09/25/2018 FINDINGS: BRAIN New Lesions: None Larger lesions: None Stable or Smaller lesions: Stable post treatment changes at the left frontoparietal vertex with postsurgical scarring and gliotic change of the regional white matter. Postsurgical contrast enhancement is stable. Other Brain findings: Brain otherwise is normal for age. No evidence of ischemic changes. No hydrocephalus or extra-axial  collection. Vascular: Major vessels at the base of the brain show flow. Skull and upper cervical spine: Previous left vertex craniotomy changes. No evidence of metastatic disease. Sinuses/Orbits: Clear/negative Other: None IMPRESSION: No evidence of any new disease. Stable post treatment changes at the left frontoparietal vertex without evidence of residual viable tumor at this time. Electronically Signed   By: Nelson Chimes M.D.   On: 04/09/2019 14:06    Assessment and Plan: Clinically and radiographically stable.  Needs re-engagement with physical therapy exercises Follow Up Instructions: RTC after repeat MRI brain in 6 months.  May discontinue Keppra as he has been seizure free for multiple years.  I discussed the assessment and treatment plan with the patient.  The patient was provided an opportunity to ask questions and all were answered.  The patient agreed with the plan and demonstrated understanding of the instructions.    The patient was advised to call back or seek an in-person evaluation if the symptoms worsen or if the condition fails to improve as anticipated.  I provided 5-10 minutes of non-face-to-face time during this enocunter.  Ventura Sellers, MD   I provided 15 minutes of non face-to-face telephone visit time during this encounter, and > 50% was spent counseling as documented under my assessment & plan.

## 2019-04-12 ENCOUNTER — Telehealth: Payer: Self-pay | Admitting: Internal Medicine

## 2019-04-12 NOTE — Telephone Encounter (Signed)
Scheduled per los. Called and spoke with patient. Confirmed appt 

## 2019-05-09 DIAGNOSIS — Z23 Encounter for immunization: Secondary | ICD-10-CM | POA: Diagnosis not present

## 2019-05-31 ENCOUNTER — Other Ambulatory Visit: Payer: Medicare Other

## 2019-06-03 ENCOUNTER — Ambulatory Visit: Payer: Medicare Other | Admitting: Internal Medicine

## 2019-06-04 ENCOUNTER — Ambulatory Visit (HOSPITAL_COMMUNITY)
Admission: RE | Admit: 2019-06-04 | Discharge: 2019-06-04 | Disposition: A | Discharge status: Home / Self Care | Payer: Medicare Other | Source: Ambulatory Visit | Attending: Internal Medicine | Admitting: Internal Medicine

## 2019-06-04 ENCOUNTER — Other Ambulatory Visit: Payer: Self-pay | Admitting: Internal Medicine

## 2019-06-04 ENCOUNTER — Other Ambulatory Visit: Payer: Self-pay

## 2019-06-04 ENCOUNTER — Inpatient Hospital Stay: Payer: Medicare Other | Attending: Internal Medicine

## 2019-06-04 DIAGNOSIS — N289 Disorder of kidney and ureter, unspecified: Secondary | ICD-10-CM | POA: Diagnosis not present

## 2019-06-04 DIAGNOSIS — C3432 Malignant neoplasm of lower lobe, left bronchus or lung: Secondary | ICD-10-CM | POA: Diagnosis not present

## 2019-06-04 DIAGNOSIS — C7931 Secondary malignant neoplasm of brain: Secondary | ICD-10-CM | POA: Insufficient documentation

## 2019-06-04 DIAGNOSIS — C3490 Malignant neoplasm of unspecified part of unspecified bronchus or lung: Secondary | ICD-10-CM

## 2019-06-04 LAB — CBC WITH DIFFERENTIAL (CANCER CENTER ONLY)
Abs Immature Granulocytes: 0.05 10*3/uL (ref 0.00–0.07)
Basophils Absolute: 0.1 10*3/uL (ref 0.0–0.1)
Basophils Relative: 1 %
Eosinophils Absolute: 0.1 10*3/uL (ref 0.0–0.5)
Eosinophils Relative: 1 %
HCT: 46.3 % (ref 39.0–52.0)
Hemoglobin: 15 g/dL (ref 13.0–17.0)
Immature Granulocytes: 1 %
Lymphocytes Relative: 17 %
Lymphs Abs: 1.5 10*3/uL (ref 0.7–4.0)
MCH: 31.5 pg (ref 26.0–34.0)
MCHC: 32.4 g/dL (ref 30.0–36.0)
MCV: 97.3 fL (ref 80.0–100.0)
Monocytes Absolute: 0.8 10*3/uL (ref 0.1–1.0)
Monocytes Relative: 8 %
Neutro Abs: 6.6 10*3/uL (ref 1.7–7.7)
Neutrophils Relative %: 72 %
Platelet Count: 322 10*3/uL (ref 150–400)
RBC: 4.76 MIL/uL (ref 4.22–5.81)
RDW: 13.2 % (ref 11.5–15.5)
WBC Count: 9.1 10*3/uL (ref 4.0–10.5)
nRBC: 0 % (ref 0.0–0.2)

## 2019-06-04 LAB — CMP (CANCER CENTER ONLY)
ALT: 21 U/L (ref 0–44)
AST: 31 U/L (ref 15–41)
Albumin: 4 g/dL (ref 3.5–5.0)
Alkaline Phosphatase: 118 U/L (ref 38–126)
Anion gap: 8 (ref 5–15)
BUN: 12 mg/dL (ref 6–20)
CO2: 23 mmol/L (ref 22–32)
Calcium: 8.8 mg/dL — ABNORMAL LOW (ref 8.9–10.3)
Chloride: 107 mmol/L (ref 98–111)
Creatinine: 1.02 mg/dL (ref 0.61–1.24)
GFR, Est AFR Am: >60 mL/min (ref >60)
GFR, Estimated: >60 mL/min (ref >60)
Glucose, Bld: 98 mg/dL (ref 70–99)
Potassium: 4.2 mmol/L (ref 3.5–5.1)
Sodium: 138 mmol/L (ref 135–145)
Total Bilirubin: 0.3 mg/dL (ref 0.3–1.2)
Total Protein: 7.1 g/dL (ref 6.5–8.1)

## 2019-06-04 MED ORDER — IOHEXOL 9 MG/ML PO SOLN
ORAL | Status: AC
  Filled 2019-06-04: qty 1000

## 2019-06-04 MED ORDER — IOHEXOL 300 MG/ML  SOLN
100.0000 mL | Freq: Once | INTRAMUSCULAR | Status: AC | PRN
  Administered 2019-06-04: 100 mL via INTRAVENOUS

## 2019-06-04 MED ORDER — SODIUM CHLORIDE (PF) 0.9 % IJ SOLN
INTRAMUSCULAR | Status: AC
  Filled 2019-06-04: qty 50

## 2019-06-04 MED ORDER — RIVAROXABAN (XARELTO) VTE STARTER PACK (15 & 20 MG): ORAL_TABLET | ORAL | 0 refills | Status: DC

## 2019-06-04 MED ORDER — IOHEXOL 9 MG/ML PO SOLN
1000.0000 mL | ORAL | Status: AC
  Administered 2019-06-04: 1000 mL via ORAL

## 2019-06-05 ENCOUNTER — Telehealth: Payer: Self-pay | Admitting: Internal Medicine

## 2019-06-06 ENCOUNTER — Encounter: Payer: Self-pay | Admitting: Internal Medicine

## 2019-06-06 ENCOUNTER — Inpatient Hospital Stay (HOSPITAL_BASED_OUTPATIENT_CLINIC_OR_DEPARTMENT_OTHER): Payer: Medicare Other | Admitting: Internal Medicine

## 2019-06-06 DIAGNOSIS — C3490 Malignant neoplasm of unspecified part of unspecified bronchus or lung: Secondary | ICD-10-CM

## 2019-06-06 DIAGNOSIS — C3432 Malignant neoplasm of lower lobe, left bronchus or lung: Secondary | ICD-10-CM

## 2019-06-06 DIAGNOSIS — I2692 Saddle embolus of pulmonary artery without acute cor pulmonale: Secondary | ICD-10-CM | POA: Diagnosis not present

## 2019-06-06 DIAGNOSIS — C7931 Secondary malignant neoplasm of brain: Secondary | ICD-10-CM

## 2019-06-06 NOTE — Progress Notes (Signed)
[place] Telephone:(336) [phone]   Fax:(336) [phone]  PROGRESS NOTE FOR TELEMEDICINE VISITS  Timothy Melter, MD [place] [address]  I connected [phone] on 06/06/19 at 11:30 AM EDT by video enabled telemedicine visit and verified that I am speaking with the correct person using two identifiers.   I discussed the limitations, risks, security and privacy concerns of performing an evaluation and management service by telemedicine and the availability of in-person appointments. I also discussed with the patient that there may be a patient responsible charge related to this service. The patient expressed understanding and agreed to proceed.  Other persons participating in the visit and their role in the encounter: His daughter  Patient's location: Home Provider's location: [place]  DIAGNOSIS: 1) Stage IV (T2a, N1, M1b) non-small cell lung cancer, adenocarcinoma, negative EGFR mutation but equivocal EGFR amplification, negative ALK gene translocation and negative ROS 1 but with PDL-1 expression 100%presented with left lower lobe lung mass in addition to left hilar adenopathy and solitary metastatic brain lesion diagnosed in December 2016. 2) nonocclusive right upper lobe pulmonary artery embolism diagnosed on incidental CT scan of the chest on 06/04/2019  PRIOR THERAPY: 1) status post stereotactic radiotherapy and surgical resection diagnosed in December 2016. 2) status post video bronchoscopy with left VATS and wedge resection of the left upper lobe and left lower lobe superior segmentectomy with lymph node dissection under the care of Dr. Servando Snare on 01/14/2015. 3) First-line treatment with immunotherapy with Ketruda (pembrolizumab) 200 mg IV every 3 weeks status post 35 cycles. First cycle was given 02/18/2015. Discontinued after the patient completed 2 years of treatment. 4) stereotactic left frontal craniotomy for resection of  tumor and the final pathology was consistent with tumor necrosis. This was done on 05/12/2017  CURRENT THERAPY: Xarelto 15 mg p.o. twice daily for 3 weeks followed by 20 mg p.o. daily.  First dose started 06/04/2019..  INTERVAL HISTORY: Timothy Obrien 59 y.o. male has a MyChart virtual visit with me today for evaluation and recommendation regarding his condition as well as discussion of his discuss results.  The patient is feeling fine today with no concerning complaints.  He was found on the recent CT scan of the chest to have nonocclusive right upper lobe pulmonary embolism.  I called the patient 2 days ago and started him on treatment with Xarelto initially 15 mg p.o. twice daily for 3 weeks followed by 20 mg p.o. daily.  The patient denied having any bleeding issues.  He denied having any current chest pain, shortness of breath, cough or hemoptysis.  He denied having any fever or chills.  He has no nausea, vomiting, diarrhea or constipation.  He mentioned recent history of severe dry cough and he thinks that he broke some ribs with the cough.  He is feeling much better now.  He had repeat CT scan of the chest, abdomen pelvis performed recently and we are having the visit for evaluation and discussion of his discuss results.  MEDICAL HISTORY: Past Medical History:  Diagnosis Date  . Anxiety   . Bronchitis   . Chronic fatigue 09/17/2015  . Depression 02/11/2016  . Hypertension 12/31/2015   states never been told  . Lung cancer ([place]) dx'd 2016   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  . Pneumonia   . Seizures ([place])    last 12/11/14  . Shortness of breath dyspnea    with exertion  . Tobacco  abuse     ALLERGIES:  has No Known Allergies.  MEDICATIONS:  Current Outpatient Medications  Medication Sig Dispense Refill  . mirtazapine (REMERON) 30 MG tablet TAKE 1 TABLET BY MOUTH EVERYDAY AT BEDTIME 90 tablet 0  . RIVAROXABAN (XARELTO) VTE STARTER PACK (15 & 20 MG TABLETS) Follow package  directions: Take one 71m tablet by mouth twice a day. On day 22, switch to one 289mtablet once a day. Take with food. 51 each 0  . acetaminophen (TYLENOL) 500 MG tablet Take 500 mg by mouth 2 (two) times daily as needed.    . Marland Kitchencetaminophen-codeine (TYLENOL #3) 300-30 MG tablet Take 1 tablet by mouth every 6 (six) hours as needed for moderate pain. (Patient not taking: Reported on 05/23/2017) 60 tablet 0  . ALPRAZolam (XANAX) 0.25 MG tablet Take 0.25 mg by mouth daily as needed for anxiety.     . Marland Kitchenbuprofen (ADVIL,MOTRIN) 600 MG tablet Take 600 mg by mouth 2 (two) times daily as needed.     No current facility-administered medications for this visit.    SURGICAL HISTORY:  Past Surgical History:  Procedure Laterality Date  . APPLICATION OF CRANIAL NAVIGATION N/A 12/26/2014   Procedure: APPLICATION OF CRANIAL NAVIGATION;  Surgeon: BeKevan Nyitty, MD;  [facility];  Service: Neurosurgery;  Laterality: N/A;  . APPLICATION OF CRANIAL NAVIGATION Left 05/12/2017   Procedure: APPLICATION OF CRANIAL NAVIGATION;  Surgeon: NuConsuella LoseMD;  [facility] Service: Neurosurgery;  Laterality: Left;  APPLICATION OF CRANIAL NAVIGATION  . CRANIOTOMY N/A 12/26/2014   Procedure: CRANIOTOMY TUMOR EXCISION with [facility];  Surgeon: BeKevan Nyitty, MD;  [facility];  Service: Neurosurgery;  Laterality: N/A;  CRANIOTOMY TUMOR EXCISION with [facility]  . CRANIOTOMY Left 05/12/2017   Procedure: STEREOTACTIC LEFT FRONTOPERIETIAL CRANIOTOMY FOR RESECTION OF TUMOR;  Surgeon: NuConsuella LoseMD;  [facility] Service: Neurosurgery;  Laterality: Left;  STEREOTACTIC LEFT FRONTOPERIETIAL CRANIOTOMY FOR RESECTION OF TUMOR  . HERNIA REPAIR    . VASECTOMY    . VIDEO ASSISTED THORACOSCOPY (VATS)/WEDGE RESECTION Left 01/14/2015   Procedure: VIDEO ASSISTED THORACOSCOPY (VATS)/WEDGE RESECTION, Superior segmentectomy left  lower lobe, wedge resection of left upper lobe, multiple lymph node  disection, On Q insertion.;  Surgeon: EdGrace IsaacMD;  [facility] Service: Thoracic;  Laterality: Left;  . Marland KitchenIDEO BRONCHOSCOPY Bilateral 12/16/2014   Procedure: VIDEO BRONCHOSCOPY WITH FLUORO;  Surgeon: RoCollene GobbleMD;  [facility] Service: Cardiopulmonary;  Laterality: Bilateral;  . VIDEO BRONCHOSCOPY N/A 01/14/2015   Procedure: VIDEO BRONCHOSCOPY;  Surgeon: EdGrace IsaacMD;  [facility];  Service: Thoracic;  Laterality: N/A;    REVIEW OF SYSTEMS:  Constitutional: positive for fatigue Eyes: negative Ears, nose, mouth, throat, and face: negative Respiratory: positive for cough Cardiovascular: negative Gastrointestinal: negative Genitourinary:negative Integument/breast: negative Hematologic/lymphatic: negative Musculoskeletal:negative Neurological: negative Behavioral/Psych: negative Endocrine: negative Allergic/Immunologic: negative    LABORATORY DATA: Lab Results  Component Value Date   WBC 9.1 06/04/2019   HGB 15.0 06/04/2019   HCT 46.3 06/04/2019   MCV 97.3 06/04/2019   PLT 322 06/04/2019      Chemistry      Component Value Date/Time   NA 138 06/04/2019 1116   NA 137 01/12/2017 0846   K 4.2 06/04/2019 1116   K 4.4 01/12/2017 0846   CL 107 06/04/2019 1116   CO2 23 06/04/2019 1116   CO2 18 (L) 01/12/2017 0846   BUN 12 06/04/2019 1116  BUN 15.3 01/12/2017 0846   CREATININE 1.02 06/04/2019 1116   CREATININE 1.0 01/12/2017 0846      Component Value Date/Time   CALCIUM 8.8 (L) 06/04/2019 1116   CALCIUM 9.0 01/12/2017 0846   ALKPHOS 118 06/04/2019 1116   ALKPHOS 99 01/12/2017 0846   AST 31 06/04/2019 1116   AST 23 01/12/2017 0846   ALT 21 06/04/2019 1116   ALT 22 01/12/2017 0846   BILITOT 0.3 06/04/2019 1116   BILITOT 0.41 01/12/2017 0846       RADIOGRAPHIC STUDIES: CT Chest W Contrast  Addendum Date: 06/04/2019   ADDENDUM REPORT: 06/04/2019 15:19 ADDENDUM: These results were called by telephone at the time of  interpretation on 06/04/2019 at 3:19 pm to provider [facility] , who verbally acknowledged these results. Electronically Signed   By: Zetta Bills M.D.   On: 06/04/2019 15:19   Result Date: 06/04/2019 CLINICAL DATA:  Non-small cell lung cancer staging EXAM: CT CHEST, ABDOMEN, AND PELVIS WITH CONTRAST TECHNIQUE: Multidetector CT imaging of the chest, abdomen and pelvis was performed following the standard protocol during bolus administration of intravenous contrast. CONTRAST:  164m OMNIPAQUE IOHEXOL 300 MG/ML  SOLN COMPARISON:  Prior study of 11/27/2018 FINDINGS: CT CHEST FINDINGS Cardiovascular: No signs of aortic dilation. Normal heart size without pericardial effusion. Nonocclusive embolus in the RIGHT upper lobe pulmonary artery at the origin of this vessel. No signs of large central embolus. Study not protocoled for pulmonary arterial evaluation. Mediastinum/Nodes: No thoracic inlet or axillary lymphadenopathy. Esophagus grossly normal. No hilar adenopathy. No mediastinal adenopathy. Lungs/Pleura: Marked pulmonary emphysema worse at the lung apices. No consolidation. No pleural effusion. No suspicious mass or nodule. Stable small nodule in the LEFT costodiaphragmatic sulcus posteriorly (image 146, series 4) 5 mm. Musculoskeletal: No chest wall lesion. CT ABDOMEN PELVIS FINDINGS Hepatobiliary: No focal, suspicious hepatic lesion with patent portal vein. No biliary ductal dilation. No pericholecystic stranding. Pancreas: Pancreas is normal without focal lesion or ductal dilation. Spleen: Spleen normal size without focal lesion. Adrenals/Urinary Tract: Adrenal glands are normal. Renal contours are smooth. Small low-density lesion arising from the upper pole the RIGHT kidney is unchanged. Urinary bladder is unremarkable. Stomach/Bowel: Mild bowel wall thickening. No perienteric stranding. This is mainly jejunum. Appendix is normal. Signs of diverticular disease with segmental colonic thickening of the  sigmoid colon is similar to the prior exam. Vascular/Lymphatic: Calcified and noncalcified plaque of the abdominal aorta without aneurysm. No adenopathy. No pelvic lymphadenopathy. Reproductive: Prostate with mild heterogeneity which is nonspecific on CT. Other: No free fluid. Musculoskeletal: Healing rib fractures along the RIGHT chest. 2 of these are new. A RIGHT ninth rib fracture is associated with a gap along the fracture line in a slight bulge towards the pleural surface. This raises the question of a lesion associated pathologic fracture. Healing fracture along the RIGHT lateral chest involving the RIGHT lateral seventh rib. New eighth rib fracture without discrete lesion. IMPRESSION: 1. Nonocclusive embolus in the RIGHT upper lobe pulmonary artery at the origin of this vessel. No signs of large central embolus. 2. Question mild jejunal thickening but without Peri jejunal stranding. Correlate with any symptoms that would suggest enteritis. 3. Possible pathologic fracture of RIGHT posterior ninth rib. 4. New eighth rib fracture without discrete lesion. 5. Signs of diverticular disease with segmental colonic thickening of the sigmoid colon is similar to the prior exam. 6. Emphysema. 7. Emphysema and aortic atherosclerosis. A call is out to the referring provider to further discuss findings in the above case.  Aortic Atherosclerosis (ICD10-I70.0) and Emphysema (ICD10-J43.9). Electronically Signed: By: Zetta Bills M.D. On: 06/04/2019 15:01   CT Abdomen Pelvis W Contrast  Addendum Date: 06/04/2019   ADDENDUM REPORT: 06/04/2019 15:19 ADDENDUM: These results were called by telephone at the time of interpretation on 06/04/2019 at 3:19 pm to provider [facility] , who verbally acknowledged these results. Electronically Signed   By: Zetta Bills M.D.   On: 06/04/2019 15:19   Result Date: 06/04/2019 CLINICAL DATA:  Non-small cell lung cancer staging EXAM: CT CHEST, ABDOMEN, AND PELVIS WITH CONTRAST  TECHNIQUE: Multidetector CT imaging of the chest, abdomen and pelvis was performed following the standard protocol during bolus administration of intravenous contrast. CONTRAST:  179m OMNIPAQUE IOHEXOL 300 MG/ML  SOLN COMPARISON:  Prior study of 11/27/2018 FINDINGS: CT CHEST FINDINGS Cardiovascular: No signs of aortic dilation. Normal heart size without pericardial effusion. Nonocclusive embolus in the RIGHT upper lobe pulmonary artery at the origin of this vessel. No signs of large central embolus. Study not protocoled for pulmonary arterial evaluation. Mediastinum/Nodes: No thoracic inlet or axillary lymphadenopathy. Esophagus grossly normal. No hilar adenopathy. No mediastinal adenopathy. Lungs/Pleura: Marked pulmonary emphysema worse at the lung apices. No consolidation. No pleural effusion. No suspicious mass or nodule. Stable small nodule in the LEFT costodiaphragmatic sulcus posteriorly (image 146, series 4) 5 mm. Musculoskeletal: No chest wall lesion. CT ABDOMEN PELVIS FINDINGS Hepatobiliary: No focal, suspicious hepatic lesion with patent portal vein. No biliary ductal dilation. No pericholecystic stranding. Pancreas: Pancreas is normal without focal lesion or ductal dilation. Spleen: Spleen normal size without focal lesion. Adrenals/Urinary Tract: Adrenal glands are normal. Renal contours are smooth. Small low-density lesion arising from the upper pole the RIGHT kidney is unchanged. Urinary bladder is unremarkable. Stomach/Bowel: Mild bowel wall thickening. No perienteric stranding. This is mainly jejunum. Appendix is normal. Signs of diverticular disease with segmental colonic thickening of the sigmoid colon is similar to the prior exam. Vascular/Lymphatic: Calcified and noncalcified plaque of the abdominal aorta without aneurysm. No adenopathy. No pelvic lymphadenopathy. Reproductive: Prostate with mild heterogeneity which is nonspecific on CT. Other: No free fluid. Musculoskeletal: Healing rib  fractures along the RIGHT chest. 2 of these are new. A RIGHT ninth rib fracture is associated with a gap along the fracture line in a slight bulge towards the pleural surface. This raises the question of a lesion associated pathologic fracture. Healing fracture along the RIGHT lateral chest involving the RIGHT lateral seventh rib. New eighth rib fracture without discrete lesion. IMPRESSION: 1. Nonocclusive embolus in the RIGHT upper lobe pulmonary artery at the origin of this vessel. No signs of large central embolus. 2. Question mild jejunal thickening but without Peri jejunal stranding. Correlate with any symptoms that would suggest enteritis. 3. Possible pathologic fracture of RIGHT posterior ninth rib. 4. New eighth rib fracture without discrete lesion. 5. Signs of diverticular disease with segmental colonic thickening of the sigmoid colon is similar to the prior exam. 6. Emphysema. 7. Emphysema and aortic atherosclerosis. A call is out to the referring provider to further discuss findings in the above case. Aortic Atherosclerosis (ICD10-I70.0) and Emphysema (ICD10-J43.9). Electronically Signed: By: GZetta BillsM.D. On: 06/04/2019 15:01    ASSESSMENT AND PLAN: This is a very pleasant 89ears old white male with metastatic non-small cell lung cancer, adenocarcinoma with positive PDL 1 expression of 100%. He completed treatment with Ketruda (pembrolizumab) 200 mg IV every 3 weeks, status post 35 cycles, a total of 2 years. The patient is currently on observation and he  is feeling fine with no concerning complaints. He had repeat CT scan of the chest performed recently.  I personally and independently reviewed the scan images and discussed the results with the patient and his daughter. His scan showed no concerning findings for disease progression and there was some evidence of new rib fractures and the right eighth rib as well as possible pathologic fraction of the right posterior ninth ribs.  These are  likely secondary to his recent severe cough but will need close follow-up on upcoming imaging studies. The scan also showed nonocclusive embolus in the right upper lobe pulmonary artery. The patient was started on treatment with Xarelto for this condition. I recommended for the patient to continue on observation with repeat CT scan of the chest, abdomen pelvis in 6 months.  He was also advised to call immediately if he has any concerning symptoms in the interval. I discussed the assessment and treatment plan with the patient. The patient was provided an opportunity to ask questions and all were answered. The patient agreed with the plan and demonstrated an understanding of the instructions.   The patient was advised to call back or seek an in-person evaluation if the symptoms worsen or if the condition fails to improve as anticipated.  I provided 21 minutes of face-to-face video visit time during this encounter, and > 50% was spent counseling as documented under my assessment & plan.  Eilleen Kempf, MD 06/06/2019 11:53 AM  Disclaimer: This note was dictated with voice recognition software. Similar sounding words can inadvertently be transcribed and may not be corrected upon review.

## 2019-06-18 ENCOUNTER — Telehealth: Payer: Self-pay | Admitting: Internal Medicine

## 2019-06-18 NOTE — Telephone Encounter (Signed)
Scheduled per los. Called and left msg. Mailed printout  °

## 2019-06-19 ENCOUNTER — Telehealth: Payer: Self-pay | Admitting: Medical Oncology

## 2019-06-19 NOTE — Telephone Encounter (Signed)
Pharmacy calling for refill on Xarelto for Pt assistance program. Medication refill sent to [facility] for authorization.

## 2019-06-20 ENCOUNTER — Other Ambulatory Visit: Payer: Self-pay | Admitting: Internal Medicine

## 2019-06-20 DIAGNOSIS — G4700 Insomnia, unspecified: Secondary | ICD-10-CM

## 2019-06-20 NOTE — Telephone Encounter (Signed)
Refill request, please see

## 2019-06-20 NOTE — Telephone Encounter (Signed)
Rx Request 

## 2019-09-15 ENCOUNTER — Other Ambulatory Visit: Payer: Self-pay | Admitting: Internal Medicine

## 2019-09-15 DIAGNOSIS — G4700 Insomnia, unspecified: Secondary | ICD-10-CM

## 2019-09-17 ENCOUNTER — Other Ambulatory Visit: Payer: Self-pay | Admitting: Medical Oncology

## 2019-09-17 ENCOUNTER — Telehealth: Payer: Self-pay | Admitting: Medical Oncology

## 2019-09-17 DIAGNOSIS — I2692 Saddle embolus of pulmonary artery without acute cor pulmonale: Secondary | ICD-10-CM

## 2019-09-17 NOTE — Telephone Encounter (Signed)
I asked pt to return my call re refill request for xarelto from [facility] in [place].

## 2019-09-18 MED ORDER — XARELTO 20 MG PO TABS: 20.0000 mg | ORAL_TABLET | Freq: Every day | ORAL | 2 refills | Status: DC

## 2019-09-18 NOTE — Addendum Note (Signed)
Addended by: Ardeen Garland on: 09/18/2019 10:25 AM   Modules accepted: Orders

## 2019-11-06 DIAGNOSIS — Z23 Encounter for immunization: Secondary | ICD-10-CM | POA: Diagnosis not present

## 2019-11-15 ENCOUNTER — Other Ambulatory Visit: Payer: Self-pay | Admitting: Radiation Therapy

## 2019-12-04 ENCOUNTER — Encounter (HOSPITAL_COMMUNITY): Payer: Self-pay

## 2019-12-04 ENCOUNTER — Ambulatory Visit (HOSPITAL_COMMUNITY)
Admission: RE | Admit: 2019-12-04 | Discharge: 2019-12-04 | Disposition: A | Discharge status: Home / Self Care | Payer: Medicare Other | Source: Ambulatory Visit | Attending: Internal Medicine | Admitting: Internal Medicine

## 2019-12-04 ENCOUNTER — Inpatient Hospital Stay: Payer: Medicare Other | Attending: Internal Medicine

## 2019-12-04 ENCOUNTER — Other Ambulatory Visit: Payer: Self-pay

## 2019-12-04 DIAGNOSIS — I700 Atherosclerosis of aorta: Secondary | ICD-10-CM | POA: Diagnosis not present

## 2019-12-04 DIAGNOSIS — C3490 Malignant neoplasm of unspecified part of unspecified bronchus or lung: Secondary | ICD-10-CM

## 2019-12-04 DIAGNOSIS — J984 Other disorders of lung: Secondary | ICD-10-CM | POA: Diagnosis not present

## 2019-12-04 DIAGNOSIS — C3432 Malignant neoplasm of lower lobe, left bronchus or lung: Secondary | ICD-10-CM | POA: Diagnosis not present

## 2019-12-04 DIAGNOSIS — K5730 Diverticulosis of large intestine without perforation or abscess without bleeding: Secondary | ICD-10-CM | POA: Diagnosis not present

## 2019-12-04 DIAGNOSIS — I2510 Atherosclerotic heart disease of native coronary artery without angina pectoris: Secondary | ICD-10-CM | POA: Diagnosis not present

## 2019-12-04 DIAGNOSIS — J439 Emphysema, unspecified: Secondary | ICD-10-CM | POA: Diagnosis not present

## 2019-12-04 DIAGNOSIS — K4090 Unilateral inguinal hernia, without obstruction or gangrene, not specified as recurrent: Secondary | ICD-10-CM | POA: Diagnosis not present

## 2019-12-04 LAB — CBC WITH DIFFERENTIAL (CANCER CENTER ONLY)
Abs Immature Granulocytes: 0.02 10*3/uL (ref 0.00–0.07)
Basophils Absolute: 0.1 10*3/uL (ref 0.0–0.1)
Basophils Relative: 1 %
Eosinophils Absolute: 0.1 10*3/uL (ref 0.0–0.5)
Eosinophils Relative: 1 %
HCT: 46.1 % (ref 39.0–52.0)
Hemoglobin: 15.6 g/dL (ref 13.0–17.0)
Immature Granulocytes: 0 %
Lymphocytes Relative: 14 %
Lymphs Abs: 1.1 10*3/uL (ref 0.7–4.0)
MCH: 31.3 pg (ref 26.0–34.0)
MCHC: 33.8 g/dL (ref 30.0–36.0)
MCV: 92.6 fL (ref 80.0–100.0)
Monocytes Absolute: 0.6 10*3/uL (ref 0.1–1.0)
Monocytes Relative: 8 %
Neutro Abs: 6.1 10*3/uL (ref 1.7–7.7)
Neutrophils Relative %: 76 %
Platelet Count: 296 10*3/uL (ref 150–400)
RBC: 4.98 MIL/uL (ref 4.22–5.81)
RDW: 14.4 % (ref 11.5–15.5)
WBC Count: 7.9 10*3/uL (ref 4.0–10.5)
nRBC: 0 % (ref 0.0–0.2)

## 2019-12-04 LAB — CMP (CANCER CENTER ONLY)
ALT: 15 U/L (ref 0–44)
AST: 25 U/L (ref 15–41)
Albumin: 4.1 g/dL (ref 3.5–5.0)
Alkaline Phosphatase: 97 U/L (ref 38–126)
Anion gap: 10 (ref 5–15)
BUN: 11 mg/dL (ref 6–20)
CO2: 18 mmol/L — ABNORMAL LOW (ref 22–32)
Calcium: 8.9 mg/dL (ref 8.9–10.3)
Chloride: 108 mmol/L (ref 98–111)
Creatinine: 1 mg/dL (ref 0.61–1.24)
GFR, Estimated: >60 mL/min (ref >60)
Glucose, Bld: 109 mg/dL — ABNORMAL HIGH (ref 70–99)
Potassium: 4.2 mmol/L (ref 3.5–5.1)
Sodium: 136 mmol/L (ref 135–145)
Total Bilirubin: 0.3 mg/dL (ref 0.3–1.2)
Total Protein: 7.3 g/dL (ref 6.5–8.1)

## 2019-12-04 MED ORDER — IOHEXOL 9 MG/ML PO SOLN
ORAL | Status: AC
  Filled 2019-12-04: qty 1000

## 2019-12-04 MED ORDER — IOHEXOL 300 MG/ML  SOLN
100.0000 mL | Freq: Once | INTRAMUSCULAR | Status: AC | PRN
  Administered 2019-12-04: 100 mL via INTRAVENOUS

## 2019-12-04 MED ORDER — IOHEXOL 9 MG/ML PO SOLN
1000.0000 mL | ORAL | Status: AC
  Administered 2019-12-04: 1000 mL via ORAL

## 2019-12-06 ENCOUNTER — Other Ambulatory Visit: Payer: Medicare Other

## 2019-12-09 ENCOUNTER — Other Ambulatory Visit: Payer: Self-pay | Admitting: Internal Medicine

## 2019-12-09 ENCOUNTER — Encounter: Payer: Self-pay | Admitting: Internal Medicine

## 2019-12-09 ENCOUNTER — Other Ambulatory Visit: Payer: Self-pay

## 2019-12-09 ENCOUNTER — Inpatient Hospital Stay (HOSPITAL_BASED_OUTPATIENT_CLINIC_OR_DEPARTMENT_OTHER): Payer: Medicare Other | Admitting: Internal Medicine

## 2019-12-09 DIAGNOSIS — C3490 Malignant neoplasm of unspecified part of unspecified bronchus or lung: Secondary | ICD-10-CM | POA: Diagnosis not present

## 2019-12-09 DIAGNOSIS — G4700 Insomnia, unspecified: Secondary | ICD-10-CM

## 2019-12-09 DIAGNOSIS — C3432 Malignant neoplasm of lower lobe, left bronchus or lung: Secondary | ICD-10-CM | POA: Diagnosis not present

## 2019-12-09 NOTE — Telephone Encounter (Signed)
Rx Request 

## 2019-12-09 NOTE — Progress Notes (Signed)
[place] [phone]   [phone]  PROGRESS NOTE FOR TELEMEDICINE VISITS  [name], MD [place] [id number]  I connected [phone] on 12/09/19 at  9:30 AM EST by video enabled telemedicine visit and verified that I am speaking with the correct person using two identifiers.   I discussed the limitations, risks, security and privacy concerns of performing an evaluation and management service by telemedicine and the availability of in-person appointments. I also discussed with the patient that there may be a patient responsible charge related to this service. The patient expressed understanding and agreed to proceed.  Other persons participating in the visit and their role in the encounter:  None  Patient's location: Home Provider's location: [place]  DIAGNOSIS: 1) Stage IV (T2a, N1, M1b) non-small cell lung cancer, adenocarcinoma, negative EGFR mutation but equivocal EGFR amplification, negative ALK gene translocation and negative ROS 1 but with PDL-1 expression 100%presented with left lower lobe lung mass in addition to left hilar adenopathy and solitary metastatic brain lesion diagnosed in December 2016. 2) nonocclusive right upper lobe pulmonary artery embolism diagnosed on incidental CT scan of the chest on 06/04/2019  PRIOR THERAPY: 1) status post stereotactic radiotherapy and surgical resection diagnosed in December 2016. 2) status post video bronchoscopy with left VATS and wedge resection of the left upper lobe and left lower lobe superior segmentectomy with lymph node dissection under the care of Dr. Servando Snare on 01/14/2015. 3) First-line treatment with immunotherapy with Ketruda (pembrolizumab) 200 mg IV every 3 weeks status post 35 cycles. First cycle was given 02/18/2015. Discontinued after the patient completed 2 years of treatment. 4) stereotactic left frontal craniotomy for resection of  tumor and the final pathology was consistent with tumor necrosis. This was done on 05/12/2017  CURRENT THERAPY: Xarelto 15 mg p.o. twice daily for 3 weeks followed by 20 mg p.o. daily.  First dose started 06/04/2019.  INTERVAL HISTORY: Timothy Obrien 59 y.o. male has a MyChart virtual visit with me today for evaluation and discussion of his scan results.  The patient is feeling fine today with no concerning complaints.  He has no changes in his health since the last 6 months.  He denied having any chest pain, shortness of breath, cough or hemoptysis.  He denied having any fever or chills.  He has no nausea, vomiting, diarrhea or constipation.  He is currently on treatment with Xarelto for the incidentally diagnosed pulmonary embolism in May 2021.  The patient had repeat CT scan of the chest, abdomen pelvis performed recently and we are having the visit for evaluation and discussion of his scan results.    MEDICAL HISTORY: Past Medical History:  Diagnosis Date  . Anxiety   . Bronchitis   . Chronic fatigue 09/17/2015  . Depression 02/11/2016  . Hypertension 12/31/2015   states never been told  . Lung cancer ([place]) dx'd 2016   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  . Pneumonia   . Seizures ([place])    last 12/11/14  . Shortness of breath dyspnea    with exertion  . Tobacco abuse     ALLERGIES:  has No Known Allergies.  MEDICATIONS:  Current Outpatient Medications  Medication Sig Dispense Refill  . acetaminophen (TYLENOL) 500 MG tablet Take 500 mg by mouth 2 (two) times daily as needed.    Marland Kitchen acetaminophen-codeine (TYLENOL #3) 300-30 MG tablet Take 1 tablet by mouth every 6 (six) hours  as needed for moderate pain. (Patient not taking: Reported on 05/23/2017) 60 tablet 0  . ALPRAZolam (XANAX) 0.25 MG tablet Take 0.25 mg by mouth daily as needed for anxiety.     Marland Kitchen ibuprofen (ADVIL,MOTRIN) 600 MG tablet Take 600 mg by mouth 2 (two) times daily as needed.    . mirtazapine (REMERON) 30 MG tablet  TAKE 1 TABLET BY MOUTH EVERYDAY AT BEDTIME 90 tablet 0  . XARELTO 20 MG TABS tablet Take 1 tablet (20 mg total) by mouth daily. 30 tablet 2   No current facility-administered medications for this visit.    SURGICAL HISTORY:  Past Surgical History:  Procedure Laterality Date  . APPLICATION OF CRANIAL NAVIGATION N/A 12/26/2014   Procedure: APPLICATION OF CRANIAL NAVIGATION;  Surgeon: Kevan Ny Ditty, MD;  Location: [facility];  Service: Neurosurgery;  Laterality: N/A;  . APPLICATION OF CRANIAL NAVIGATION Left 05/12/2017   Procedure: APPLICATION OF CRANIAL NAVIGATION;  Surgeon: Consuella Lose, MD;  Location: [facility];  Service: Neurosurgery;  Laterality: Left;  APPLICATION OF CRANIAL NAVIGATION  . CRANIOTOMY N/A 12/26/2014   Procedure: CRANIOTOMY TUMOR EXCISION with BrainLab;  Surgeon: Kevan Ny Ditty, MD;  Location: [facility];  Service: Neurosurgery;  Laterality: N/A;  CRANIOTOMY TUMOR EXCISION with [facility]  . CRANIOTOMY Left 05/12/2017   Procedure: STEREOTACTIC LEFT FRONTOPERIETIAL CRANIOTOMY FOR RESECTION OF TUMOR;  Surgeon: Consuella Lose, MD;  Location: [facility];  Service: Neurosurgery;  Laterality: Left;  STEREOTACTIC LEFT FRONTOPERIETIAL CRANIOTOMY FOR RESECTION OF TUMOR  . HERNIA REPAIR    . VASECTOMY    . VIDEO ASSISTED THORACOSCOPY (VATS)/WEDGE RESECTION Left 01/14/2015   Procedure: VIDEO ASSISTED THORACOSCOPY (VATS)/WEDGE RESECTION, Superior segmentectomy left  lower lobe, wedge resection of left upper lobe, multiple lymph node disection, On Q insertion.;  Surgeon: Grace Isaac, MD;  Location: [facility];  Service: Thoracic;  Laterality: Left;  Marland Kitchen VIDEO BRONCHOSCOPY Bilateral 12/16/2014   Procedure: VIDEO BRONCHOSCOPY WITH FLUORO;  Surgeon: Collene Gobble, MD;  Location: [facility];  Service: Cardiopulmonary;  Laterality: Bilateral;  . VIDEO BRONCHOSCOPY N/A 01/14/2015   Procedure: VIDEO BRONCHOSCOPY;  Surgeon: Grace Isaac, MD;  Location[facility];  Service: Thoracic;   Laterality: N/A;    REVIEW OF SYSTEMS:  A comprehensive review of systems was negative.    LABORATORY DATA: Lab Results  Component Value Date   WBC 7.9 12/04/2019   HGB 15.6 12/04/2019   HCT 46.1 12/04/2019   MCV 92.6 12/04/2019   PLT 296 12/04/2019      Chemistry      Component Value Date/Time   NA 136 12/04/2019 0930   NA 137 01/12/2017 0846   K 4.2 12/04/2019 0930   K 4.4 01/12/2017 0846   CL 108 12/04/2019 0930   CO2 18 (L) 12/04/2019 0930   CO2 18 (L) 01/12/2017 0846   BUN 11 12/04/2019 0930   BUN 15.3 01/12/2017 0846   CREATININE 1.00 12/04/2019 0930   CREATININE 1.0 01/12/2017 0846      Component Value Date/Time   CALCIUM 8.9 12/04/2019 0930   CALCIUM 9.0 01/12/2017 0846   ALKPHOS 97 12/04/2019 0930   ALKPHOS 99 01/12/2017 0846   AST 25 12/04/2019 0930   AST 23 01/12/2017 0846   ALT 15 12/04/2019 0930   ALT 22 01/12/2017 0846   BILITOT 0.3 12/04/2019 0930   BILITOT 0.41 01/12/2017 0846       RADIOGRAPHIC STUDIES: CT Chest W Contrast  Result Date: 12/04/2019 CLINICAL DATA:  Non-small cell lung cancer.  Restaging. EXAM: CT CHEST, ABDOMEN, AND PELVIS WITH CONTRAST TECHNIQUE: Multidetector CT imaging of the chest, abdomen and pelvis was performed following the standard protocol during bolus administration of intravenous contrast. CONTRAST:  113m OMNIPAQUE IOHEXOL 300 MG/ML  SOLN COMPARISON:  06/04/2019 FINDINGS: CT CHEST FINDINGS Cardiovascular: The heart is normal in size. No pericardial effusion. Mild tortuosity of the thoracic aorta but no focal aneurysm or dissection. No atherosclerotic calcifications. Stable scattered coronary artery calcifications. Mediastinum/Nodes: No mediastinal or hilar mass or lymphadenopathy. Small scattered sub 8 mm lymph nodes are stable. The esophagus is grossly normal. Lungs/Pleura: Stable advanced emphysematous changes and areas of pulmonary scarring. No worrisome pulmonary lesions. Stable 4 mm subpleural pulmonary nodule at the  left lung base on image 162/6. No pleural effusions or pleural nodules. Musculoskeletal: No chest wall mass, supraclavicular or axillary adenopathy. The thyroid gland is normal. Remote ununited ninth posterior rib fracture. No worrisome bone lesions. CT ABDOMEN PELVIS FINDINGS Hepatobiliary: No hepatic lesions to suggest metastatic disease. No intrahepatic biliary dilatation. The gallbladder is normal. No common bile duct dilatation. Pancreas: No mass, inflammation or ductal dilatation. Spleen: Normal size.  No focal lesions. Adrenals/Urinary Tract: The adrenal glands and kidneys are unremarkable and stable. The bladder is normal. Stomach/Bowel: The stomach, duodenum, small bowel and colon are unremarkable. No mass lesions or obstructive findings. Slightly prominent jejunal mucosal folds but this appears relatively stable. The ileal loops of small bowel appear normal. Moderate sigmoid colon diverticulosis but no findings for acute diverticulitis. Vascular/Lymphatic: Stable age advanced atherosclerotic calcifications involving the aorta and branch vessels but no aneurysm or dissection. No mesenteric or retroperitoneal mass or adenopathy. Reproductive: The prostate gland and seminal vesicles are unremarkable. Other: No free pelvic fluid collections or pelvic adenopathy. No inguinal adenopathy. Small inguinal hernia containing fat again noted. Musculoskeletal: No significant bony findings. IMPRESSION: 1. Stable advanced emphysematous changes and pulmonary scarring. No worrisome pulmonary lesions. 2. No mediastinal or hilar mass or adenopathy. 3. Stable 4 mm subpleural pulmonary nodule at the left lung base. 4. No findings for abdominal/pelvic metastatic disease. 5. Stable age advanced atherosclerotic calcifications involving the aorta and branch vessels. 6. Emphysema and aortic atherosclerosis. Aortic Atherosclerosis (ICD10-I70.0) and Emphysema (ICD10-J43.9). Electronically Signed   By: PMarijo SanesM.D.   On:  12/04/2019 15:18   CT Abdomen Pelvis W Contrast  Result Date: 12/04/2019 CLINICAL DATA:  Non-small cell lung cancer.  Restaging. EXAM: CT CHEST, ABDOMEN, AND PELVIS WITH CONTRAST TECHNIQUE: Multidetector CT imaging of the chest, abdomen and pelvis was performed following the standard protocol during bolus administration of intravenous contrast. CONTRAST:  1035mOMNIPAQUE IOHEXOL 300 MG/ML  SOLN COMPARISON:  06/04/2019 FINDINGS: CT CHEST FINDINGS Cardiovascular: The heart is normal in size. No pericardial effusion. Mild tortuosity of the thoracic aorta but no focal aneurysm or dissection. No atherosclerotic calcifications. Stable scattered coronary artery calcifications. Mediastinum/Nodes: No mediastinal or hilar mass or lymphadenopathy. Small scattered sub 8 mm lymph nodes are stable. The esophagus is grossly normal. Lungs/Pleura: Stable advanced emphysematous changes and areas of pulmonary scarring. No worrisome pulmonary lesions. Stable 4 mm subpleural pulmonary nodule at the left lung base on image 162/6. No pleural effusions or pleural nodules. Musculoskeletal: No chest wall mass, supraclavicular or axillary adenopathy. The thyroid gland is normal. Remote ununited ninth posterior rib fracture. No worrisome bone lesions. CT ABDOMEN PELVIS FINDINGS Hepatobiliary: No hepatic lesions to suggest metastatic disease. No intrahepatic biliary dilatation. The gallbladder is normal. No common bile duct dilatation. Pancreas: No mass, inflammation or ductal dilatation. Spleen:  Normal size.  No focal lesions. Adrenals/Urinary Tract: The adrenal glands and kidneys are unremarkable and stable. The bladder is normal. Stomach/Bowel: The stomach, duodenum, small bowel and colon are unremarkable. No mass lesions or obstructive findings. Slightly prominent jejunal mucosal folds but this appears relatively stable. The ileal loops of small bowel appear normal. Moderate sigmoid colon diverticulosis but no findings for acute  diverticulitis. Vascular/Lymphatic: Stable age advanced atherosclerotic calcifications involving the aorta and branch vessels but no aneurysm or dissection. No mesenteric or retroperitoneal mass or adenopathy. Reproductive: The prostate gland and seminal vesicles are unremarkable. Other: No free pelvic fluid collections or pelvic adenopathy. No inguinal adenopathy. Small inguinal hernia containing fat again noted. Musculoskeletal: No significant bony findings. IMPRESSION: 1. Stable advanced emphysematous changes and pulmonary scarring. No worrisome pulmonary lesions. 2. No mediastinal or hilar mass or adenopathy. 3. Stable 4 mm subpleural pulmonary nodule at the left lung base. 4. No findings for abdominal/pelvic metastatic disease. 5. Stable age advanced atherosclerotic calcifications involving the aorta and branch vessels. 6. Emphysema and aortic atherosclerosis. Aortic Atherosclerosis (ICD10-I70.0) and Emphysema (ICD10-J43.9). Electronically Signed   By: Marijo Sanes M.D.   On: 12/04/2019 15:18    ASSESSMENT AND PLAN: This is a very pleasant 59years old white male with metastatic non-small cell lung cancer, adenocarcinoma with positive PDL 1 expression of 100%. He completed treatment with Ketruda (pembrolizumab) 200 mg IV every 3 weeks, status post 35 cycles, a total of 2 years. The patient is currently on observation and he is feeling fine today with no concerning complaints He had repeat CT scan of the chest, abdomen pelvis performed recently.  I personally and independently reviewed the scans and discussed the results with the patient today. His scan showed no concerning findings for disease progression. I recommended for him to continue on observation with repeat CT scan of the chest, abdomen and pelvis in 6 months. For the history of pulmonary embolism, he will continue his current treatment with Xarelto for another 6 months as long as he does not have any adverse effect from this treatment.Marland Kitchen He  was advised to call immediately if he has any concerning symptoms in the interval.  I discussed the assessment and treatment plan with the patient. The patient was provided an opportunity to ask questions and all were answered. The patient agreed with the plan and demonstrated an understanding of the instructions.   The patient was advised to call back or seek an in-person evaluation if the symptoms worsen or if the condition fails to improve as anticipated.  I provided 15 minutes of face-to-face video visit time during this encounter, and > 50% was spent counseling as documented under my assessment & plan.  Eilleen Kempf, MD 12/09/2019 9:09 AM  Disclaimer: This note was dictated with voice recognition software. Similar sounding words can inadvertently be transcribed and may not be corrected upon review.

## 2019-12-11 ENCOUNTER — Other Ambulatory Visit: Payer: Self-pay | Admitting: Medical Oncology

## 2019-12-11 DIAGNOSIS — I2692 Saddle embolus of pulmonary artery without acute cor pulmonale: Secondary | ICD-10-CM

## 2019-12-11 MED ORDER — XARELTO 20 MG PO TABS: 20.0000 mg | ORAL_TABLET | Freq: Every day | ORAL | 2 refills | Status: DC

## 2019-12-13 ENCOUNTER — Ambulatory Visit: Payer: Medicare Other | Admitting: Internal Medicine

## 2019-12-19 ENCOUNTER — Ambulatory Visit
Admission: RE | Admit: 2019-12-19 | Discharge: 2019-12-19 | Disposition: A | Discharge status: Home / Self Care | Payer: Medicare Other | Source: Ambulatory Visit | Attending: Internal Medicine | Admitting: Internal Medicine

## 2019-12-19 DIAGNOSIS — C7931 Secondary malignant neoplasm of brain: Secondary | ICD-10-CM

## 2019-12-19 DIAGNOSIS — C3490 Malignant neoplasm of unspecified part of unspecified bronchus or lung: Secondary | ICD-10-CM | POA: Diagnosis not present

## 2019-12-19 DIAGNOSIS — C799 Secondary malignant neoplasm of unspecified site: Secondary | ICD-10-CM | POA: Diagnosis not present

## 2019-12-19 MED ORDER — GADOBENATE DIMEGLUMINE 529 MG/ML IV SOLN
16.0000 mL | Freq: Once | INTRAVENOUS | Status: AC | PRN
  Administered 2019-12-19: 16 mL via INTRAVENOUS

## 2019-12-23 ENCOUNTER — Inpatient Hospital Stay: Payer: Medicare Other | Attending: Internal Medicine | Admitting: Internal Medicine

## 2019-12-23 DIAGNOSIS — C7931 Secondary malignant neoplasm of brain: Secondary | ICD-10-CM | POA: Diagnosis not present

## 2019-12-23 DIAGNOSIS — R29898 Other symptoms and signs involving the musculoskeletal system: Secondary | ICD-10-CM

## 2019-12-23 NOTE — Progress Notes (Signed)
I connected with Timothy Obrien on 12/23/19 at 11:00 AM EST by telephone visit and verified that I am speaking with the correct person using two identifiers.  I discussed the limitations, risks, security and privacy concerns of performing an evaluation and management service by telemedicine and the availability of in-person appointments. I also discussed with the patient that there may be a patient responsible charge related to this service. The patient expressed understanding and agreed to proceed.  Other persons participating in the visit and their role in the encounter:  n/a  Patient's location:  Home  Provider's location:  Office  Chief Complaint:  Brain metastasis ([place])  Right leg weakness   History of Present Ilness: Timothy Obrien describes no new or progressive neurologic deficits.  No changes with regards to right leg function.  No seizures or headaches.  On observation now with Dr. Julien Nordmann. Observations: Language and cognition at baseline  Imaging:  [facility] Clinician Interpretation: I have personally reviewed the CNS images as listed.  My interpretation, in the context of the patient's clinical presentation, is treatment effect vs true progression  CT Chest W Contrast  Result Date: 12/04/2019 CLINICAL DATA:  Non-small cell lung cancer.  Restaging. EXAM: CT CHEST, ABDOMEN, AND PELVIS WITH CONTRAST TECHNIQUE: Multidetector CT imaging of the chest, abdomen and pelvis was performed following the standard protocol during bolus administration of intravenous contrast. CONTRAST:  162mL OMNIPAQUE IOHEXOL 300 MG/ML  SOLN COMPARISON:  06/04/2019 FINDINGS: CT CHEST FINDINGS Cardiovascular: The heart is normal in size. No pericardial effusion. Mild tortuosity of the thoracic aorta but no focal aneurysm or dissection. No atherosclerotic calcifications. Stable scattered coronary artery calcifications. Mediastinum/Nodes: No mediastinal or hilar mass or lymphadenopathy. Small scattered sub 8 mm lymph  nodes are stable. The esophagus is grossly normal. Lungs/Pleura: Stable advanced emphysematous changes and areas of pulmonary scarring. No worrisome pulmonary lesions. Stable 4 mm subpleural pulmonary nodule at the left lung base on image 162/6. No pleural effusions or pleural nodules. Musculoskeletal: No chest wall mass, supraclavicular or axillary adenopathy. The thyroid gland is normal. Remote ununited ninth posterior rib fracture. No worrisome bone lesions. CT ABDOMEN PELVIS FINDINGS Hepatobiliary: No hepatic lesions to suggest metastatic disease. No intrahepatic biliary dilatation. The gallbladder is normal. No common bile duct dilatation. Pancreas: No mass, inflammation or ductal dilatation. Spleen: Normal size.  No focal lesions. Adrenals/Urinary Tract: The adrenal glands and kidneys are unremarkable and stable. The bladder is normal. Stomach/Bowel: The stomach, duodenum, small bowel and colon are unremarkable. No mass lesions or obstructive findings. Slightly prominent jejunal mucosal folds but this appears relatively stable. The ileal loops of small bowel appear normal. Moderate sigmoid colon diverticulosis but no findings for acute diverticulitis. Vascular/Lymphatic: Stable age advanced atherosclerotic calcifications involving the aorta and branch vessels but no aneurysm or dissection. No mesenteric or retroperitoneal mass or adenopathy. Reproductive: The prostate gland and seminal vesicles are unremarkable. Other: No free pelvic fluid collections or pelvic adenopathy. No inguinal adenopathy. Small inguinal hernia containing fat again noted. Musculoskeletal: No significant bony findings. IMPRESSION: 1. Stable advanced emphysematous changes and pulmonary scarring. No worrisome pulmonary lesions. 2. No mediastinal or hilar mass or adenopathy. 3. Stable 4 mm subpleural pulmonary nodule at the left lung base. 4. No findings for abdominal/pelvic metastatic disease. 5. Stable age advanced atherosclerotic  calcifications involving the aorta and branch vessels. 6. Emphysema and aortic atherosclerosis. Aortic Atherosclerosis (ICD10-I70.0) and Emphysema (ICD10-J43.9). Electronically Signed   By: Marijo Sanes M.D.   On: 12/04/2019 15:18  MR BRAIN W WO CONTRAST  Result Date: 12/19/2019 CLINICAL DATA:  Follow-up metastatic lung cancer. Previous surgery and S RS. EXAM: MRI HEAD WITHOUT AND WITH CONTRAST TECHNIQUE: Multiplanar, multiecho pulse sequences of the brain and surrounding structures were obtained without and with intravenous contrast. CONTRAST:  25mL MULTIHANCE GADOBENATE DIMEGLUMINE 529 MG/ML IV SOLN COMPARISON:  04/09/2019, 09/25/2018, 03/16/2018 11/17/2017. FINDINGS: Brain: Previous left frontoparietal vertex surgery and radiation treatment for a metastatic lesion. The majority of the appearance is stable. Regional gliosis is stable without increasing edema or mass effect. Retracted scarring appearance is stable. At the inferior aspect of the enhancing component, there appears to be a slight rounded prominence that is becoming slowly more noticeable over the last several scans, at the same time that the majority of the enhancing material is contracting. Whereas this may still relate to post treatment scarring, continued observation of this inferior margin is warranted on subsequent exams. No new lesion seen anywhere else within the brain. No sign ischemic disease. No hydrocephalus or extra-axial collection. Vascular: Major vessels at the base of the brain show flow. Skull and upper cervical spine: Negative Sinuses/Orbits: No significant sinus inflammation.  Orbits negative. Other: None IMPRESSION: Previous left frontoparietal vertex surgery and radiation treatment for a metastatic lesion. The majority of the enhancing material is stable. Stable gliotic pattern without increased mass effect or edema. At the inferior aspect of the enhancing component, there appears to be a slight rounded prominence that is  becoming slowly more noticeable over the last several scans, at the same time that the majority of the enhancing material is contracting. Whereas this may still relate to post treatment scarring, continued observation of this inferior margin is warranted on subsequent exams. No new lesion seen anywhere else within the brain. Electronically Signed   By: Nelson Chimes M.D.   On: 12/19/2019 12:41   CT Abdomen Pelvis W Contrast  Result Date: 12/04/2019 CLINICAL DATA:  Non-small cell lung cancer.  Restaging. EXAM: CT CHEST, ABDOMEN, AND PELVIS WITH CONTRAST TECHNIQUE: Multidetector CT imaging of the chest, abdomen and pelvis was performed following the standard protocol during bolus administration of intravenous contrast. CONTRAST:  186mL OMNIPAQUE IOHEXOL 300 MG/ML  SOLN COMPARISON:  06/04/2019 FINDINGS: CT CHEST FINDINGS Cardiovascular: The heart is normal in size. No pericardial effusion. Mild tortuosity of the thoracic aorta but no focal aneurysm or dissection. No atherosclerotic calcifications. Stable scattered coronary artery calcifications. Mediastinum/Nodes: No mediastinal or hilar mass or lymphadenopathy. Small scattered sub 8 mm lymph nodes are stable. The esophagus is grossly normal. Lungs/Pleura: Stable advanced emphysematous changes and areas of pulmonary scarring. No worrisome pulmonary lesions. Stable 4 mm subpleural pulmonary nodule at the left lung base on image 162/6. No pleural effusions or pleural nodules. Musculoskeletal: No chest wall mass, supraclavicular or axillary adenopathy. The thyroid gland is normal. Remote ununited ninth posterior rib fracture. No worrisome bone lesions. CT ABDOMEN PELVIS FINDINGS Hepatobiliary: No hepatic lesions to suggest metastatic disease. No intrahepatic biliary dilatation. The gallbladder is normal. No common bile duct dilatation. Pancreas: No mass, inflammation or ductal dilatation. Spleen: Normal size.  No focal lesions. Adrenals/Urinary Tract: The adrenal  glands and kidneys are unremarkable and stable. The bladder is normal. Stomach/Bowel: The stomach, duodenum, small bowel and colon are unremarkable. No mass lesions or obstructive findings. Slightly prominent jejunal mucosal folds but this appears relatively stable. The ileal loops of small bowel appear normal. Moderate sigmoid colon diverticulosis but no findings for acute diverticulitis. Vascular/Lymphatic: Stable age advanced atherosclerotic calcifications involving the  aorta and branch vessels but no aneurysm or dissection. No mesenteric or retroperitoneal mass or adenopathy. Reproductive: The prostate gland and seminal vesicles are unremarkable. Other: No free pelvic fluid collections or pelvic adenopathy. No inguinal adenopathy. Small inguinal hernia containing fat again noted. Musculoskeletal: No significant bony findings. IMPRESSION: 1. Stable advanced emphysematous changes and pulmonary scarring. No worrisome pulmonary lesions. 2. No mediastinal or hilar mass or adenopathy. 3. Stable 4 mm subpleural pulmonary nodule at the left lung base. 4. No findings for abdominal/pelvic metastatic disease. 5. Stable age advanced atherosclerotic calcifications involving the aorta and branch vessels. 6. Emphysema and aortic atherosclerosis. Aortic Atherosclerosis (ICD10-I70.0) and Emphysema (ICD10-J43.9). Electronically Signed   By: Marijo Sanes M.D.   On: 12/04/2019 15:18   Assessment and Plan: Brain metastasis ([facility])  Right leg weakness  Very subtle enlargement of enhancing nodule inferior to resection cavity on the left.  Etiology is gliosis vs radiation necrosis vs recurrent tumor, in order of likelihood.  Follow Up Instructions: We ask that Timothy Obrien return to clinic in 6 months following next brain MRI, or sooner as needed.  I discussed the assessment and treatment plan with the patient.  The patient was provided an opportunity to ask questions and all were answered.  The patient agreed with the  plan and demonstrated understanding of the instructions.    The patient was advised to call back or seek an in-person evaluation if the symptoms worsen or if the condition fails to improve as anticipated.  I provided 5-10 minutes of non-face-to-face time during this enocunter.  Ventura Sellers, MD   I provided 15 minutes of non face-to-face telephone visit time during this encounter, and > 50% was spent counseling as documented under my assessment & plan.

## 2019-12-27 ENCOUNTER — Other Ambulatory Visit: Payer: Self-pay | Admitting: Radiation Therapy

## 2020-02-14 ENCOUNTER — Telehealth: Payer: Self-pay | Admitting: Internal Medicine

## 2020-02-14 NOTE — Telephone Encounter (Signed)
Rescheduled patients appointment in June due to provider pal, left a voicemail regarding new scheduled date.

## 2020-02-25 ENCOUNTER — Telehealth: Payer: Self-pay | Admitting: Internal Medicine

## 2020-02-25 NOTE — Telephone Encounter (Signed)
Contacted patient about upcoming appointment 06/14. Patient is aware.

## 2020-03-04 ENCOUNTER — Other Ambulatory Visit: Payer: Self-pay | Admitting: Internal Medicine

## 2020-03-04 DIAGNOSIS — I2692 Saddle embolus of pulmonary artery without acute cor pulmonale: Secondary | ICD-10-CM

## 2020-03-04 DIAGNOSIS — G4700 Insomnia, unspecified: Secondary | ICD-10-CM

## 2020-03-05 NOTE — Telephone Encounter (Signed)
Rx request 

## 2020-04-06 ENCOUNTER — Telehealth: Payer: Self-pay | Admitting: Internal Medicine

## 2020-04-06 NOTE — Telephone Encounter (Signed)
Scheduled appts per 3/25 sch msg. Pt aware.

## 2020-04-22 ENCOUNTER — Other Ambulatory Visit: Payer: Self-pay

## 2020-04-22 ENCOUNTER — Emergency Department (HOSPITAL_BASED_OUTPATIENT_CLINIC_OR_DEPARTMENT_OTHER): Payer: Medicare Other

## 2020-04-22 ENCOUNTER — Telehealth: Payer: Self-pay

## 2020-04-22 ENCOUNTER — Emergency Department (HOSPITAL_BASED_OUTPATIENT_CLINIC_OR_DEPARTMENT_OTHER)
Admission: EM | Admit: 2020-04-22 | Discharge: 2020-04-22 | Disposition: A | Discharge status: Home / Self Care | Payer: Medicare Other | Attending: Emergency Medicine | Admitting: Emergency Medicine

## 2020-04-22 ENCOUNTER — Encounter (HOSPITAL_BASED_OUTPATIENT_CLINIC_OR_DEPARTMENT_OTHER): Payer: Self-pay | Admitting: *Deleted

## 2020-04-22 DIAGNOSIS — I10 Essential (primary) hypertension: Secondary | ICD-10-CM | POA: Insufficient documentation

## 2020-04-22 DIAGNOSIS — Z85118 Personal history of other malignant neoplasm of bronchus and lung: Secondary | ICD-10-CM | POA: Insufficient documentation

## 2020-04-22 DIAGNOSIS — Z7901 Long term (current) use of anticoagulants: Secondary | ICD-10-CM | POA: Insufficient documentation

## 2020-04-22 DIAGNOSIS — Z87891 Personal history of nicotine dependence: Secondary | ICD-10-CM | POA: Diagnosis not present

## 2020-04-22 DIAGNOSIS — R600 Localized edema: Secondary | ICD-10-CM | POA: Diagnosis not present

## 2020-04-22 DIAGNOSIS — M79672 Pain in left foot: Secondary | ICD-10-CM | POA: Insufficient documentation

## 2020-04-22 DIAGNOSIS — Z872 Personal history of diseases of the skin and subcutaneous tissue: Secondary | ICD-10-CM | POA: Diagnosis not present

## 2020-04-22 DIAGNOSIS — M7989 Other specified soft tissue disorders: Secondary | ICD-10-CM | POA: Diagnosis not present

## 2020-04-22 DIAGNOSIS — M85872 Other specified disorders of bone density and structure, left ankle and foot: Secondary | ICD-10-CM | POA: Diagnosis not present

## 2020-04-22 MED ORDER — PREDNISONE 10 MG (21) PO TBPK: ORAL_TABLET | Freq: Every day | ORAL | 0 refills | Status: DC

## 2020-04-22 MED ORDER — OXYCODONE-ACETAMINOPHEN 5-325 MG PO TABS: 1.0000 | ORAL_TABLET | Freq: Three times a day (TID) | ORAL | 0 refills | Status: DC | PRN

## 2020-04-22 NOTE — ED Triage Notes (Signed)
C/o left foot pain x 3 days , sent here from PMD for eval DVT

## 2020-04-22 NOTE — Telephone Encounter (Signed)
Pt called stating he thinks he is having the same sx he had before when he had a clot. Pt c/o pain in his left foot near his great toe. Denies swelling and redness. Pt describes the pain as an ache and throb.  Discussed with Dr. Julien Nordmann who advises if pt suspects he has a blood clot, he needs to go to the ED, otherwise he should contact his PCP.  I have called the pt back and advised as indicated. Pt indicated he will contact his PCP.

## 2020-04-22 NOTE — ED Provider Notes (Signed)
[facility] EMERGENCY DEPARTMENT Provider Note   CSN: [id number] Arrival date & time: 04/22/20  1317     History Chief Complaint  Patient presents with  . Foot Pain    Timothy Obrien is a 60 y.o. male.  HPI   60 year old male with a history of anxiety, bronchitis, depression, non-small cell lung cancer with brain mets, pneumonia, seizures, tobacco use, who presents the emergency department today for evaluation of pain to the left foot that started 3 days ago.  He has some swelling pain and redness near the left great toe.  States he had similar symptoms in the past and at that time had an ultrasound for a blood clot which was negative but shortly after he had a saddle PE so he is concerned that his symptoms at that time could have been related to a blood clot.  He does note that the symptoms at that time resolved on their own and that it is a possibility it could have been gout.  He does note that he eats red meat frequently and drinks beer every day.  He did this prior to the onset of his current symptoms.  Patient called his oncologist today who advised him that he needed to be seen by provider today.  He was seen by his PCP who stated that he thought his symptoms could be related to gout however was concern for a DVT and stated if patient wanted to be evaluated for this he would need to be seen in the ED.  Patient denies any chest pain, shortness of breath, fevers.  Past Medical History:  Diagnosis Date  . Anxiety   . Bronchitis   . Chronic fatigue 09/17/2015  . Depression 02/11/2016  . Hypertension 12/31/2015   states never been told  . Lung cancer ([place]) dx'd 2016   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  . Pneumonia   . Seizures ([place])    last 12/11/14  . Shortness of breath dyspnea    with exertion  . Tobacco abuse     Patient Active Problem List   Diagnosis Date Noted  . 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 Brain tumor ([place]) 05/12/2017  . Right  leg weakness 01/12/2017  . Depression 02/11/2016  . Hypertension 12/31/2015  . Chronic fatigue 09/17/2015  . Shoulder pain, left 08/06/2015  . Encounter for antineoplastic immunotherapy 03/12/2015  . Malnutrition of moderate degree 01/17/2015  . S/P craniotomy 12/26/2014  . Brain metastasis ([place]) 12/26/2014  . Primary cancer of left lower lobe of lung ([place]) 12/22/2014  . Right sided weakness 12/12/2014  . Tobacco abuse     Past Surgical History:  Procedure Laterality Date  . APPLICATION OF CRANIAL NAVIGATION N/A 12/26/2014   Procedure: APPLICATION OF CRANIAL NAVIGATION;  Surgeon: Kevan Ny Ditty, MD;  Location: [facility];  Service: Neurosurgery;  Laterality: N/A;  . APPLICATION OF CRANIAL NAVIGATION Left 05/12/2017   Procedure: APPLICATION OF CRANIAL NAVIGATION;  Surgeon: Consuella Lose, MD;  Location: [place] [facility];  Service: Neurosurgery;  Laterality: Left;  APPLICATION OF CRANIAL NAVIGATION  . CRANIOTOMY N/A 12/26/2014   Procedure: CRANIOTOMY TUMOR EXCISION with BrainLab;  Surgeon: Kevan Ny Ditty, MD;  Location: [place];  Service: Neurosurgery;  Laterality: N/A;  CRANIOTOMY TUMOR EXCISION with [facility]  . CRANIOTOMY Left 05/12/2017   Procedure: STEREOTACTIC LEFT FRONTOPERIETIAL CRANIOTOMY FOR RESECTION OF TUMOR;  Surgeon: Consuella Lose, MD;  Location: [facility];  Service: Neurosurgery;  Laterality: Left;  STEREOTACTIC  LEFT FRONTOPERIETIAL CRANIOTOMY FOR RESECTION OF TUMOR  . HERNIA REPAIR    . VASECTOMY    . VIDEO ASSISTED THORACOSCOPY (VATS)/WEDGE RESECTION Left 01/14/2015   Procedure: VIDEO ASSISTED THORACOSCOPY (VATS)/WEDGE RESECTION, Superior segmentectomy left  lower lobe, wedge resection of left upper lobe, multiple lymph node disection, On Q insertion.;  Surgeon: Grace Isaac, MD;  Location: [facility];  Service: Thoracic;  Laterality: Left;  Marland Kitchen VIDEO BRONCHOSCOPY Bilateral 12/16/2014   Procedure: VIDEO BRONCHOSCOPY WITH FLUORO;  Surgeon: Collene Gobble, MD;  Location:  [place];  Service: Cardiopulmonary;  Laterality: Bilateral;  . VIDEO BRONCHOSCOPY N/A 01/14/2015   Procedure: VIDEO BRONCHOSCOPY;  Surgeon: Grace Isaac, MD;  [facility];  Service: Thoracic;  Laterality: N/A;       Family History  Problem Relation Age of Onset  . Breast cancer Mother   . Colon polyps Mother   . Arthritis Father     Social History   Tobacco Use  . Smoking status: Former Smoker    Packs/day: 1.00    Years: 36.00    Pack years: 36.00    Types: Cigarettes    Start date: 12/14/1979    Quit date: 10/07/2015    Years since quitting: 4.5  . Smokeless tobacco: Current User  . Tobacco comment: Vapes: Few cigars a day. Quit cigarettes after easter.  Vaping Use  . Vaping Use: Some days  Substance Use Topics  . Alcohol use: Yes    Alcohol/week: 21.0 standard drinks    Types: 21 Cans of beer per week    Comment: 3 beers a day   . Drug use: Yes    Types: Marijuana    Comment: last time 01/04/15    Home Medications Prior to Admission medications   Medication Sig Start Date End Date Taking? Authorizing Provider  predniSONE (STERAPRED UNI-PAK 21 TAB) 10 MG (21) TBPK tablet Take by mouth daily. Take 6 tabs by mouth daily  for 2 days, then 5 tabs for 2 days, then 4 tabs for 2 days, then 3 tabs for 2 days, 2 tabs for 2 days, then 1 tab by mouth daily for 2 days 04/22/20  Yes Torez Beauregard S, PA-C  acetaminophen (TYLENOL) 500 MG tablet Take 500 mg by mouth 2 (two) times daily as needed.    [provider]  acetaminophen-codeine (TYLENOL #3) 300-30 MG tablet Take 1 tablet by mouth every 6 (six) hours as needed for moderate pain. Patient not taking: Reported on 05/23/2017 05/13/17   Ashok Pall, MD  ALPRAZolam Duanne Moron) 0.25 MG tablet Take 0.25 mg by mouth daily as needed for anxiety.  08/03/16   [provider]  ibuprofen (ADVIL,MOTRIN) 600 MG tablet Take 600 mg by mouth 2 (two) times daily as needed.    [provider]  mirtazapine  (REMERON) 30 MG tablet TAKE 1 TABLET BY MOUTH EVERYDAY AT BEDTIME 03/05/20   Vaslow, Acey Lav, MD  XARELTO 20 MG TABS tablet TAKE 1 TABLET BY MOUTH EVERY DAY 03/04/20   Curt Bears, MD    Allergies    Patient has no known allergies.  Review of Systems   Review of Systems  Constitutional: Negative for fever.  Respiratory: Negative for shortness of breath.   Cardiovascular: Negative for chest pain and leg swelling.  Musculoskeletal:       Left foot pain  Skin: Positive for color change. Negative for wound.  Neurological: Negative for weakness and numbness.    Physical Exam Updated Vital Signs BP Marland Kitchen)  145/95   Pulse 66   Temp 98.9 F (37.2 C) (Oral)   Resp 18   Ht 6' (1.829 m)   Wt 75.3 kg   SpO2 97%   BMI 22.51 kg/m   Physical Exam Constitutional:      General: He is not in acute distress.    Appearance: He is well-developed.  Eyes:     Conjunctiva/sclera: Conjunctivae normal.  Cardiovascular:     Rate and Rhythm: Normal rate.  Pulmonary:     Effort: Pulmonary effort is normal.  Musculoskeletal:     Comments: TTP over the left MTP joint with associated erythema, warmth and pain with ROM. DP pulse is strong. No edema or calf TTP noted.  Skin:    General: Skin is warm and dry.  Neurological:     Mental Status: He is alert and oriented to person, place, and time.     ED Results / Procedures / Treatments   Labs (all labs ordered are listed, but only abnormal results are displayed) Labs Reviewed - No data to display  EKG None  Radiology US Venous Img Lower Unilateral Left (DVT)  Result Date: 04/22/2020 CLINICAL DATA:  Foot pain and swelling EXAM: LEFT LOWER EXTREMITY VENOUS DOPPLER ULTRASOUND TECHNIQUE: Gray-scale sonography with graded compression, as well as color Doppler and duplex ultrasound were performed to evaluate the lower extremity deep venous systems from the level of the common femoral vein and including the common femoral, femoral, profunda  femoral, popliteal and calf veins including the posterior tibial, peroneal and gastrocnemius veins when visible. The superficial great saphenous vein was also interrogated. Spectral Doppler was utilized to evaluate flow at rest and with distal augmentation maneuvers in the common femoral, femoral and popliteal veins. COMPARISON:  None. FINDINGS: Contralateral Common Femoral Vein: Respiratory phasicity is normal and symmetric with the symptomatic side. No evidence of thrombus. Normal compressibility. Common Femoral Vein: No evidence of thrombus. Normal compressibility, respiratory phasicity and response to augmentation. Saphenofemoral Junction: No evidence of thrombus. Normal compressibility and flow on color Doppler imaging. Profunda Femoral Vein: No evidence of thrombus. Normal compressibility and flow on color Doppler imaging. Femoral Vein: No evidence of thrombus. Normal compressibility, respiratory phasicity and response to augmentation. Popliteal Vein: No evidence of thrombus. Normal compressibility, respiratory phasicity and response to augmentation. Calf Veins: No evidence of thrombus. Normal compressibility and flow on color Doppler imaging. IMPRESSION: No evidence of deep venous thrombosis. Electronically Signed   By: Jerilynn Mages.  Shick M.D.   On: 04/22/2020 15:28   DG Foot Complete Left  Result Date: 04/22/2020 CLINICAL DATA:  Left foot erythema and edema EXAM: LEFT FOOT - COMPLETE 3+ VIEW COMPARISON:  None. FINDINGS: Frontal, oblique, and lateral views of the left foot are obtained. No acute fracture, subluxation, or dislocation. Mild periarticular osteopenia of the metatarsal heads, nonspecific. No erosive changes. Joint spaces are well preserved. Soft tissues are normal. IMPRESSION: 1. Mild periarticular osteopenia at the metatarsophalangeal joints, nonspecific. No acute or destructive bony lesions. Electronically Signed   By: Randa Ngo M.D.   On: 04/22/2020 15:25    Procedures Procedures    Medications Ordered in ED Medications - No data to display  ED Course  I have reviewed the triage vital signs and the nursing notes.  Pertinent labs & imaging results that were available during my care of the patient were reviewed by me and considered in my medical decision making (see chart for details).    MDM Rules/Calculators/A&P  60 y/o M presenting for eval of left foot pain. pcp had concern for dvt as pt has h/o pe so he was sent here for further eval. pcp also had increased concern for gout.   LE [place] neg for DVT.   Xray shows 1. Mild periarticular osteopenia at the metatarsophalangeal joints, nonspecific. No acute or destructive bony lesions.   Suspect sxs are related to gout. Will treat with prednisone taper. Have advised close f/u with pcp and strict return precautions. He voices understanding and is in agreement with plan. All questions answered, pt stable for discharge.   Final Clinical Impression(s) / ED Diagnoses Final diagnoses:  Foot pain, left    Rx / DC Orders ED Discharge Orders         Ordered    predniSONE (STERAPRED UNI-PAK 21 TAB) 10 MG (21) TBPK tablet  Daily        04/22/20 1551           Ange Puskas S, PA-C 04/22/20 1551    Gareth Morgan, MD 04/24/20 [phone]

## 2020-04-22 NOTE — Discharge Instructions (Signed)
Take prednisone as directed.   Please follow up with your primary care provider within 5-7 days for re-evaluation of your symptoms. If you do not have a primary care provider, information for a healthcare clinic has been provided for you to make arrangements for follow up care. Please return to the emergency department for any new or worsening symptoms.

## 2020-06-01 ENCOUNTER — Other Ambulatory Visit: Payer: Self-pay | Admitting: Internal Medicine

## 2020-06-01 DIAGNOSIS — G4700 Insomnia, unspecified: Secondary | ICD-10-CM

## 2020-06-01 NOTE — Telephone Encounter (Signed)
Rx refill request

## 2020-06-15 ENCOUNTER — Inpatient Hospital Stay: Payer: Medicare Other | Attending: Internal Medicine

## 2020-06-15 ENCOUNTER — Ambulatory Visit (HOSPITAL_COMMUNITY)
Admission: RE | Admit: 2020-06-15 | Discharge: 2020-06-15 | Disposition: A | Discharge status: Home / Self Care | Payer: Medicare Other | Source: Ambulatory Visit | Attending: Internal Medicine | Admitting: Internal Medicine

## 2020-06-15 ENCOUNTER — Other Ambulatory Visit: Payer: Self-pay

## 2020-06-15 DIAGNOSIS — Z86711 Personal history of pulmonary embolism: Secondary | ICD-10-CM | POA: Diagnosis not present

## 2020-06-15 DIAGNOSIS — C3490 Malignant neoplasm of unspecified part of unspecified bronchus or lung: Secondary | ICD-10-CM | POA: Diagnosis not present

## 2020-06-15 DIAGNOSIS — Z87891 Personal history of nicotine dependence: Secondary | ICD-10-CM | POA: Diagnosis not present

## 2020-06-15 DIAGNOSIS — Z85118 Personal history of other malignant neoplasm of bronchus and lung: Secondary | ICD-10-CM | POA: Insufficient documentation

## 2020-06-15 DIAGNOSIS — Z9221 Personal history of antineoplastic chemotherapy: Secondary | ICD-10-CM | POA: Insufficient documentation

## 2020-06-15 DIAGNOSIS — Z7901 Long term (current) use of anticoagulants: Secondary | ICD-10-CM | POA: Insufficient documentation

## 2020-06-15 DIAGNOSIS — R531 Weakness: Secondary | ICD-10-CM | POA: Insufficient documentation

## 2020-06-15 LAB — CBC WITH DIFFERENTIAL (CANCER CENTER ONLY)
Abs Immature Granulocytes: 0.03 10*3/uL (ref 0.00–0.07)
Basophils Absolute: 0.1 10*3/uL (ref 0.0–0.1)
Basophils Relative: 1 %
Eosinophils Absolute: 0 10*3/uL (ref 0.0–0.5)
Eosinophils Relative: 0 %
HCT: 43.3 % (ref 39.0–52.0)
Hemoglobin: 14.5 g/dL (ref 13.0–17.0)
Immature Granulocytes: 0 %
Lymphocytes Relative: 14 %
Lymphs Abs: 1.2 10*3/uL (ref 0.7–4.0)
MCH: 31.5 pg (ref 26.0–34.0)
MCHC: 33.5 g/dL (ref 30.0–36.0)
MCV: 94.1 fL (ref 80.0–100.0)
Monocytes Absolute: 0.8 10*3/uL (ref 0.1–1.0)
Monocytes Relative: 9 %
Neutro Abs: 6.6 10*3/uL (ref 1.7–7.7)
Neutrophils Relative %: 76 %
Platelet Count: 317 10*3/uL (ref 150–400)
RBC: 4.6 MIL/uL (ref 4.22–5.81)
RDW: 13.8 % (ref 11.5–15.5)
WBC Count: 8.6 10*3/uL (ref 4.0–10.5)
nRBC: 0 % (ref 0.0–0.2)

## 2020-06-15 LAB — CMP (CANCER CENTER ONLY)
ALT: 17 U/L (ref 0–44)
AST: 25 U/L (ref 15–41)
Albumin: 4 g/dL (ref 3.5–5.0)
Alkaline Phosphatase: 77 U/L (ref 38–126)
Anion gap: 10 (ref 5–15)
BUN: 12 mg/dL (ref 6–20)
CO2: 24 mmol/L (ref 22–32)
Calcium: 9.4 mg/dL (ref 8.9–10.3)
Chloride: 104 mmol/L (ref 98–111)
Creatinine: 1.06 mg/dL (ref 0.61–1.24)
GFR, Estimated: >60 mL/min (ref >60)
Glucose, Bld: 107 mg/dL — ABNORMAL HIGH (ref 70–99)
Potassium: 4.5 mmol/L (ref 3.5–5.1)
Sodium: 138 mmol/L (ref 135–145)
Total Bilirubin: 0.3 mg/dL (ref 0.3–1.2)
Total Protein: 7.1 g/dL (ref 6.5–8.1)

## 2020-06-15 MED ORDER — IOHEXOL 9 MG/ML PO SOLN
ORAL | Status: AC
  Filled 2020-06-15: qty 1000

## 2020-06-15 MED ORDER — SODIUM CHLORIDE (PF) 0.9 % IJ SOLN
INTRAMUSCULAR | Status: AC
  Filled 2020-06-15: qty 50

## 2020-06-15 MED ORDER — IOHEXOL 300 MG/ML  SOLN
100.0000 mL | Freq: Once | INTRAMUSCULAR | Status: AC | PRN
  Administered 2020-06-15: 100 mL via INTRAVENOUS

## 2020-06-15 MED ORDER — IOHEXOL 9 MG/ML PO SOLN: 500.0000 mL | ORAL | Status: AC

## 2020-06-16 ENCOUNTER — Encounter: Payer: Self-pay | Admitting: Internal Medicine

## 2020-06-16 ENCOUNTER — Inpatient Hospital Stay (HOSPITAL_BASED_OUTPATIENT_CLINIC_OR_DEPARTMENT_OTHER): Payer: Medicare Other | Admitting: Internal Medicine

## 2020-06-16 VITALS — BP 136/95 | HR 72 | Temp 97.8°F | Resp 19 | Ht 72.0 in | Wt 162.8 lb

## 2020-06-16 DIAGNOSIS — C3432 Malignant neoplasm of lower lobe, left bronchus or lung: Secondary | ICD-10-CM

## 2020-06-16 DIAGNOSIS — C3490 Malignant neoplasm of unspecified part of unspecified bronchus or lung: Secondary | ICD-10-CM | POA: Diagnosis not present

## 2020-06-16 DIAGNOSIS — Z86711 Personal history of pulmonary embolism: Secondary | ICD-10-CM | POA: Diagnosis not present

## 2020-06-16 DIAGNOSIS — Z9221 Personal history of antineoplastic chemotherapy: Secondary | ICD-10-CM | POA: Diagnosis not present

## 2020-06-16 DIAGNOSIS — R531 Weakness: Secondary | ICD-10-CM | POA: Diagnosis not present

## 2020-06-16 DIAGNOSIS — C7931 Secondary malignant neoplasm of brain: Secondary | ICD-10-CM | POA: Diagnosis not present

## 2020-06-16 DIAGNOSIS — Z85118 Personal history of other malignant neoplasm of bronchus and lung: Secondary | ICD-10-CM | POA: Diagnosis not present

## 2020-06-16 DIAGNOSIS — Z87891 Personal history of nicotine dependence: Secondary | ICD-10-CM | POA: Diagnosis not present

## 2020-06-16 DIAGNOSIS — Z7901 Long term (current) use of anticoagulants: Secondary | ICD-10-CM | POA: Diagnosis not present

## 2020-06-16 NOTE — Progress Notes (Signed)
[facility] [phone]   [phone]  OFFICE PROGRESS NOTE  [name], MD [address] [place] [address]  DIAGNOSIS: 1)Stage IV (T2a, N1, M1b) non-small cell lung cancer, adenocarcinoma, negative EGFR mutation but equivocal EGFR amplification, negative ALK gene translocation and negative ROS 1 but with PDL-1 expression 100%presented with left lower lobe lung mass in addition to left hilar adenopathy and solitary metastatic brain lesion diagnosed in December 2016. 2)nonocclusive right upper lobe pulmonary artery embolism diagnosed on incidental CT scan of the chest on 06/04/2019  PRIOR THERAPY: 1) status post stereotactic radiotherapy and surgical resection diagnosed in December 2016. 2) status post video bronchoscopy with left VATS and wedge resection of the left upper lobe and left lower lobe superior segmentectomy with lymph node dissection under the care of Dr. Servando Snare on 01/14/2015. 3) First-line treatment with immunotherapy with Ketruda (pembrolizumab) 200 mg IV every 3 weeks status post 35 cycles. First cycle was given 02/18/2015. Discontinued after the patient completed 2 years of treatment. 4) stereotactic left frontal craniotomy for resection of tumor and the final pathology was consistent with tumor necrosis. This was done on 05/12/2017 5) Xarelto 15 mg p.o. twice daily for 3 weeks followed by 20 mg p.o. daily. First dose started 06/04/2019 and discontinued on June 16, 2020   [place].  INTERVAL HISTORY: Timothy Obrien 60 y.o. male returns to the clinic today for 81-monthfollow-up visit accompanied by his ex-wife.  The patient is feeling fine today with no concerning complaints except for the weakness in the lower extremities and imbalance of his gait.  He is followed by radiation oncology and Dr. VMickeal Skinnerfor the history of brain metastasis.  He denied having any current chest pain, shortness of  breath, cough or hemoptysis.  He denied having any fever or chills.  He has no nausea, vomiting, diarrhea or constipation.  He has no headache or visual changes.  He had repeat CT scan of the chest, abdomen pelvis performed recently and he is here for evaluation and discussion of his scan results.   MEDICAL HISTORY: Past Medical History:  Diagnosis Date  . Anxiety   . Bronchitis   . Chronic fatigue 09/17/2015  . Depression 02/11/2016  . Hypertension 12/31/2015   states never been told  . Lung cancer ([facility] dx[facility] 2016   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  . Pneumonia   . Seizures ([facility]    last 12/11/14  . Shortness of breath dyspnea    with exertion  . Tobacco abuse     ALLERGIES:  has No Known Allergies.  MEDICATIONS:  Current Outpatient Medications  Medication Sig Dispense Refill  . acetaminophen (TYLENOL) 500 MG tablet Take 500 mg by mouth 2 (two) times daily as needed.    .Marland Kitchenacetaminophen-codeine (TYLENOL #3) 300-30 MG tablet Take 1 tablet by mouth every 6 (six) hours as needed for moderate pain. (Patient not taking: Reported on 05/23/2017) 60 tablet 0  . ALPRAZolam (XANAX) 0.25 MG tablet Take 0.25 mg by mouth daily as needed for anxiety.     .Marland Kitchenibuprofen (ADVIL,MOTRIN) 600 MG tablet Take 600 mg by mouth 2 (two) times daily as needed.    . mirtazapine (REMERON) 30 MG tablet TAKE 1 TABLET BY MOUTH EVERYDAY AT BEDTIME 90 tablet 0  . oxyCODONE-acetaminophen (PERCOCET/ROXICET) 5-325 MG tablet Take 1 tablet by mouth every 8 (eight) hours as needed for severe pain. 6 tablet 0  .  predniSONE (STERAPRED UNI-PAK 21 TAB) 10 MG (21) TBPK tablet Take by mouth daily. Take 6 tabs by mouth daily  for 2 days, then 5 tabs for 2 days, then 4 tabs for 2 days, then 3 tabs for 2 days, 2 tabs for 2 days, then 1 tab by mouth daily for 2 days 42 tablet 0  . XARELTO 20 MG TABS tablet TAKE 1 TABLET BY MOUTH EVERY DAY 90 tablet 1   No current facility-administered medications for this visit.    SURGICAL  HISTORY:  Past Surgical History:  Procedure Laterality Date  . APPLICATION OF CRANIAL NAVIGATION N/A 12/26/2014   Procedure: APPLICATION OF CRANIAL NAVIGATION;  Surgeon: Kevan Ny Ditty, MD;  Location: [facility];  Service: Neurosurgery;  Laterality: N/A;  . APPLICATION OF CRANIAL NAVIGATION Left 05/12/2017   Procedure: APPLICATION OF CRANIAL NAVIGATION;  Surgeon: Consuella Lose, MD;  Location: [facility];  Service: Neurosurgery;  Laterality: Left;  APPLICATION OF CRANIAL NAVIGATION  . CRANIOTOMY N/A 12/26/2014   Procedure: CRANIOTOMY TUMOR EXCISION with Brain[facility];  Surgeon: Kevan Ny Ditty, MD;  Location: [facility];  Service: Neurosurgery;  Laterality: N/A;  CRANIOTOMY TUMOR EXCISION with [facility]  . CRANIOTOMY Left 05/12/2017   Procedure: STEREOTACTIC LEFT FRONTOPERIETIAL CRANIOTOMY FOR RESECTION OF TUMOR;  Surgeon: Consuella Lose, MD;  Location: [facility];  Service: Neurosurgery;  Laterality: Left;  STEREOTACTIC LEFT FRONTOPERIETIAL CRANIOTOMY FOR RESECTION OF TUMOR  . HERNIA REPAIR    . VASECTOMY    . VIDEO ASSISTED THORACOSCOPY (VATS)/WEDGE RESECTION Left 01/14/2015   Procedure: VIDEO ASSISTED THORACOSCOPY (VATS)/WEDGE RESECTION, Superior segmentectomy left  lower lobe, wedge resection of left upper lobe, multiple lymph node disection, On Q insertion.;  Surgeon: Grace Isaac, MD;  Location: [facility];  Service: Thoracic;  Laterality: Left;  Marland Kitchen VIDEO BRONCHOSCOPY Bilateral 12/16/2014   Procedure: VIDEO BRONCHOSCOPY WITH FLUORO;  Surgeon: Collene Gobble, MD;  Location: [facility];  Service: Cardiopulmonary;  Laterality: Bilateral;  . VIDEO BRONCHOSCOPY N/A 01/14/2015   Procedure: VIDEO BRONCHOSCOPY;  Surgeon: Grace Isaac, MD;  Location: [facility];  Service: Thoracic;  Laterality: N/A;    REVIEW OF SYSTEMS:  A comprehensive review of systems was negative except for: Constitutional: positive for fatigue Musculoskeletal: positive for muscle weakness   PHYSICAL EXAMINATION: General  appearance: alert, cooperative, fatigued and no distress Head: Normocephalic, without obvious abnormality, atraumatic Neck: no adenopathy, no JVD, supple, symmetrical, trachea midline and thyroid not enlarged, symmetric, no tenderness/mass/nodules Lymph nodes: Cervical, supraclavicular, and axillary nodes normal. Resp: clear to auscultation bilaterally Back: symmetric, no curvature. ROM normal. No CVA tenderness. Cardio: regular rate and rhythm, S1, S2 normal, no murmur, click, rub or gallop GI: soft, non-tender; bowel sounds normal; no masses,  no organomegaly Extremities: extremities normal, atraumatic, no cyanosis or edema  ECOG PERFORMANCE STATUS: 1 - Symptomatic but completely ambulatory  Blood pressure (!) 136/95, pulse 72, temperature 97.8 F (36.6 C), temperature source Tympanic, resp. rate 19, height 6' (1.829 m), weight 162 lb 12.8 oz (73.8 kg), SpO2 100 %.  LABORATORY DATA: Lab Results  Component Value Date   WBC 8.6 06/15/2020   HGB 14.5 06/15/2020   HCT 43.3 06/15/2020   MCV 94.1 06/15/2020   PLT 317 06/15/2020      Chemistry      Component Value Date/Time   NA 138 06/15/2020 1031   NA 137 01/12/2017 0846   K 4.5 06/15/2020 1031   K 4.4 01/12/2017 0846   CL 104 06/15/2020 1031   CO2 24  06/15/2020 1031   CO2 18 (L) 01/12/2017 0846   BUN 12 06/15/2020 1031   BUN 15.3 01/12/2017 0846   CREATININE 1.06 06/15/2020 1031   CREATININE 1.0 01/12/2017 0846      Component Value Date/Time   CALCIUM 9.4 06/15/2020 1031   CALCIUM 9.0 01/12/2017 0846   ALKPHOS 77 06/15/2020 1031   ALKPHOS 99 01/12/2017 0846   AST 25 06/15/2020 1031   AST 23 01/12/2017 0846   ALT 17 06/15/2020 1031   ALT 22 01/12/2017 0846   BILITOT 0.3 06/15/2020 1031   BILITOT 0.41 01/12/2017 0846       RADIOGRAPHIC STUDIES: CT Chest W Contrast  Result Date: 06/15/2020 CLINICAL DATA:  Non-small cell lung cancer.  Restaging. EXAM: CT CHEST, ABDOMEN, AND PELVIS WITH CONTRAST TECHNIQUE:  Multidetector CT imaging of the chest, abdomen and pelvis was performed following the standard protocol during bolus administration of intravenous contrast. CONTRAST:  15m OMNIPAQUE IOHEXOL 300 MG/ML  SOLN COMPARISON:  12/04/2019 FINDINGS: CT CHEST FINDINGS Cardiovascular: Normal heart size. No pericardial effusion identified. Scattered coronary artery calcifications. Mediastinum/Nodes: Normal appearance of the thyroid gland. The trachea appears patent and is midline. Normal appearance of the esophagus. No enlarged mediastinal or hilar adenopathy. Lungs/Pleura: No pleural effusion. Moderate centrilobular and paraseptal emphysema. No airspace consolidation. Subpleural nodule within the posterior left base is stable measuring 4 mm, image 156/6. Similar appearance of scarring within the posteromedial left lower lobe. Musculoskeletal: Remote right posterior ninth rib fracture the is unchanged, image 101/6. No acute or suspicious osseous findings. CT ABDOMEN PELVIS FINDINGS Hepatobiliary: No focal liver abnormality is seen. No gallstones, gallbladder wall thickening, or biliary dilatation. Pancreas: Unremarkable. No pancreatic ductal dilatation or surrounding inflammatory changes. Spleen: Normal in size without focal abnormality. Adrenals/Urinary Tract: Normal appearance of the adrenal glands. 7 mm low-density structure arising off the posterior cortex of the upper pole of left kidney is noted, image 68/2. This is too small to characterize but appears unchanged. No hydronephrosis identified bilaterally. Urinary bladder is unremarkable. Stomach/Bowel: Stomach is within normal limits. Appendix appears normal. No evidence of bowel wall thickening, distention, or inflammatory changes. Distal colonic diverticulosis noted without signs of acute inflammation. Vascular/Lymphatic: Aortic atherosclerosis. No aneurysm. No abdominopelvic adenopathy. Reproductive: Prostate is unremarkable. Other: No free fluid or fluid collections.  Small fat containing umbilical hernia. Musculoskeletal: No acute or suspicious osseous findings. Degenerative disc disease noted at L5-S1. IMPRESSION: 1. Stable CT of the chest, abdomen and pelvis. No specific findings identified to suggest residual or recurrent tumor or metastatic disease. 2. 4 mm nodule in the left lower lobe is unchanged from previous exam. 3. Coronary artery calcifications. Aortic Atherosclerosis (ICD10-I70.0) and Emphysema (ICD10-J43.9). Electronically Signed   By: TKerby MoorsM.D.   On: 06/15/2020 15:43   CT Abdomen Pelvis W Contrast  Result Date: 06/15/2020 CLINICAL DATA:  Non-small cell lung cancer.  Restaging. EXAM: CT CHEST, ABDOMEN, AND PELVIS WITH CONTRAST TECHNIQUE: Multidetector CT imaging of the chest, abdomen and pelvis was performed following the standard protocol during bolus administration of intravenous contrast. CONTRAST:  1060mOMNIPAQUE IOHEXOL 300 MG/ML  SOLN COMPARISON:  12/04/2019 FINDINGS: CT CHEST FINDINGS Cardiovascular: Normal heart size. No pericardial effusion identified. Scattered coronary artery calcifications. Mediastinum/Nodes: Normal appearance of the thyroid gland. The trachea appears patent and is midline. Normal appearance of the esophagus. No enlarged mediastinal or hilar adenopathy. Lungs/Pleura: No pleural effusion. Moderate centrilobular and paraseptal emphysema. No airspace consolidation. Subpleural nodule within the posterior left base is stable measuring 4 mm, image  156/6. Similar appearance of scarring within the posteromedial left lower lobe. Musculoskeletal: Remote right posterior ninth rib fracture the is unchanged, image 101/6. No acute or suspicious osseous findings. CT ABDOMEN PELVIS FINDINGS Hepatobiliary: No focal liver abnormality is seen. No gallstones, gallbladder wall thickening, or biliary dilatation. Pancreas: Unremarkable. No pancreatic ductal dilatation or surrounding inflammatory changes. Spleen: Normal in size without focal  abnormality. Adrenals/Urinary Tract: Normal appearance of the adrenal glands. 7 mm low-density structure arising off the posterior cortex of the upper pole of left kidney is noted, image 68/2. This is too small to characterize but appears unchanged. No hydronephrosis identified bilaterally. Urinary bladder is unremarkable. Stomach/Bowel: Stomach is within normal limits. Appendix appears normal. No evidence of bowel wall thickening, distention, or inflammatory changes. Distal colonic diverticulosis noted without signs of acute inflammation. Vascular/Lymphatic: Aortic atherosclerosis. No aneurysm. No abdominopelvic adenopathy. Reproductive: Prostate is unremarkable. Other: No free fluid or fluid collections. Small fat containing umbilical hernia. Musculoskeletal: No acute or suspicious osseous findings. Degenerative disc disease noted at L5-S1. IMPRESSION: 1. Stable CT of the chest, abdomen and pelvis. No specific findings identified to suggest residual or recurrent tumor or metastatic disease. 2. 4 mm nodule in the left lower lobe is unchanged from previous exam. 3. Coronary artery calcifications. Aortic Atherosclerosis (ICD10-I70.0) and Emphysema (ICD10-J43.9). Electronically Signed   By: Kerby Moors M.D.   On: 06/15/2020 15:43    ASSESSMENT AND PLAN:  This is a very pleasant 60years old white male with metastatic non-small cell lung cancer, adenocarcinoma with positive PDL 1 expression of 100%. He completed treatment with Ketruda (pembrolizumab) 200 mg IV every 3 weeks, status post 35 cycles, a total of 2 years. The patient continues to do fine with no concerning complaints except for the weakness of the lower extremities. He had repeat CT scan of the chest, abdomen pelvis performed recently.  I personally and independently reviewed the scans and discussed the results with the patient and his ex-wife. The patient has no evidence for disease recurrence or metastasis on the recent scan. I recommended for  him to continue on observation with repeat CT scan of the chest, abdomen and pelvis in 6 months. For the history of pulmonary embolism, the patient completed 1 year of treatment with Xarelto and he will discontinue this treatment at this point. For the brain metastasis and lower extremity weakness and tumor necrosis, he is followed by Dr. Mickeal Skinner. The patient was advised to call immediately if he has any other concerning symptoms in the interval.  The patient voices understanding of current disease status and treatment options and is in agreement with the current care plan. All questions were answered. The patient knows to call the clinic with any problems, questions or concerns. We can certainly see the patient much sooner if necessary.   Disclaimer: This note was dictated with voice recognition software. Similar sounding words can inadvertently be transcribed and may not be corrected upon review.

## 2020-06-16 NOTE — Addendum Note (Signed)
Addended by: Ardeen Garland on: 06/16/2020 11:58 AM   Modules accepted: Orders

## 2020-06-19 ENCOUNTER — Ambulatory Visit
Admission: RE | Admit: 2020-06-19 | Discharge: 2020-06-19 | Disposition: A | Discharge status: Home / Self Care | Payer: Medicare Other | Source: Ambulatory Visit | Attending: Internal Medicine | Admitting: Internal Medicine

## 2020-06-19 ENCOUNTER — Other Ambulatory Visit: Payer: Self-pay

## 2020-06-19 DIAGNOSIS — C7931 Secondary malignant neoplasm of brain: Secondary | ICD-10-CM

## 2020-06-19 DIAGNOSIS — C3490 Malignant neoplasm of unspecified part of unspecified bronchus or lung: Secondary | ICD-10-CM | POA: Diagnosis not present

## 2020-06-19 DIAGNOSIS — G9389 Other specified disorders of brain: Secondary | ICD-10-CM | POA: Diagnosis not present

## 2020-06-19 DIAGNOSIS — C7800 Secondary malignant neoplasm of unspecified lung: Secondary | ICD-10-CM | POA: Diagnosis not present

## 2020-06-19 DIAGNOSIS — Z9889 Other specified postprocedural states: Secondary | ICD-10-CM | POA: Diagnosis not present

## 2020-06-19 MED ORDER — GADOBENATE DIMEGLUMINE 529 MG/ML IV SOLN
15.0000 mL | Freq: Once | INTRAVENOUS | Status: AC | PRN
  Administered 2020-06-19: 15 mL via INTRAVENOUS

## 2020-06-22 ENCOUNTER — Inpatient Hospital Stay: Payer: Medicare Other

## 2020-06-23 ENCOUNTER — Other Ambulatory Visit: Payer: Self-pay

## 2020-06-23 ENCOUNTER — Inpatient Hospital Stay (HOSPITAL_BASED_OUTPATIENT_CLINIC_OR_DEPARTMENT_OTHER): Payer: Medicare Other | Admitting: Internal Medicine

## 2020-06-23 VITALS — BP 142/87 | HR 71 | Temp 97.1°F | Resp 18 | Ht 72.0 in | Wt 160.0 lb

## 2020-06-23 DIAGNOSIS — Z7901 Long term (current) use of anticoagulants: Secondary | ICD-10-CM | POA: Diagnosis not present

## 2020-06-23 DIAGNOSIS — Z85118 Personal history of other malignant neoplasm of bronchus and lung: Secondary | ICD-10-CM | POA: Diagnosis not present

## 2020-06-23 DIAGNOSIS — C7931 Secondary malignant neoplasm of brain: Secondary | ICD-10-CM

## 2020-06-23 DIAGNOSIS — R531 Weakness: Secondary | ICD-10-CM

## 2020-06-23 DIAGNOSIS — Z9221 Personal history of antineoplastic chemotherapy: Secondary | ICD-10-CM | POA: Diagnosis not present

## 2020-06-23 DIAGNOSIS — Z86711 Personal history of pulmonary embolism: Secondary | ICD-10-CM | POA: Diagnosis not present

## 2020-06-23 DIAGNOSIS — Z87891 Personal history of nicotine dependence: Secondary | ICD-10-CM | POA: Diagnosis not present

## 2020-06-23 NOTE — Progress Notes (Signed)
[facility] [place], [place] [address] [phone]   Interval Patient Evaluation  Date of Service: 06/23/20 Patient Name: Timothy Obrien Patient MRN: [id number] Patient DOB: [birth year] Provider: Ventura Sellers, MD  Identifying Statement:  Timothy Obrien is a 60 y.o. male with Brain metastasis ([place]) [C79.31]   Primary Cancer: NSCLC Stage IV  Oncologic History: 12/25/14: MRI demonstrates left frontal mestastasis, SRS is performed followed by resection. 12/23/16: Patient describes right leg weakness, some progression noted on MRI 05/12/17: Resection of left frontal progressive lesion.  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  Interval History:  Timothy Obrien presents today for follow up after recent MRI brain.  No new or progressive changes.  Continues to have some right leg weakness, stable from prior.  Denies seizures or headaches.   Medications: Current Outpatient Medications on File Prior to Visit  Medication Sig Dispense Refill   acetaminophen (TYLENOL) 500 MG tablet Take 500 mg by mouth 2 (two) times daily as needed.     acetaminophen-codeine (TYLENOL #3) 300-30 MG tablet Take 1 tablet by mouth every 6 (six) hours as needed for moderate pain. (Patient not taking: Reported on 05/23/2017) 60 tablet 0   ibuprofen (ADVIL,MOTRIN) 600 MG tablet Take 600 mg by mouth 2 (two) times daily as needed.     mirtazapine (REMERON) 30 MG tablet TAKE 1 TABLET BY MOUTH EVERYDAY AT BEDTIME 90 tablet 0   oxyCODONE-acetaminophen (PERCOCET/ROXICET) 5-325 MG tablet Take 1 tablet by mouth every 8 (eight) hours as needed for severe pain. 6 tablet 0   XARELTO 20 MG TABS tablet TAKE 1 TABLET BY MOUTH EVERY DAY 90 tablet 1   No current facility-administered medications on file prior to visit.    Allergies: No Known Allergies Past Medical History:  Past Medical History:  Diagnosis Date   Anxiety    Bronchitis    Chronic fatigue 09/17/2015    Depression 02/11/2016   Hypertension 12/31/2015   states never been told   Lung cancer ([place]) dx'd 2016   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   Pneumonia    Seizures ([place])    last 12/11/14   Shortness of breath dyspnea    with exertion   Tobacco abuse    Past Surgical History:  Past Surgical History:  Procedure Laterality Date   APPLICATION OF CRANIAL NAVIGATION N/A 12/26/2014   Procedure: APPLICATION OF CRANIAL NAVIGATION;  Surgeon: Kevan Ny Ditty, MD;  Location: [facility];  Service: Neurosurgery;  Laterality: N/A;   APPLICATION OF CRANIAL NAVIGATION Left 05/12/2017   Procedure: APPLICATION OF CRANIAL NAVIGATION;  Surgeon: Consuella Lose, MD;  Location: [facility];  Service: Neurosurgery;  Laterality: Left;  APPLICATION OF CRANIAL NAVIGATION   CRANIOTOMY N/A 12/26/2014   Procedure: CRANIOTOMY TUMOR EXCISION with BrainLab;  Surgeon: Kevan Ny Ditty, MD;  Location: [facility];  Service: Neurosurgery;  Laterality: N/A;  CRANIOTOMY TUMOR EXCISION with [facility]   CRANIOTOMY Left 05/12/2017   Procedure: STEREOTACTIC LEFT FRONTOPERIETIAL CRANIOTOMY FOR RESECTION OF TUMOR;  Surgeon: Consuella Lose, MD;  Location: [facility];  Service: Neurosurgery;  Laterality: Left;  STEREOTACTIC LEFT FRONTOPERIETIAL CRANIOTOMY FOR RESECTION OF TUMOR   HERNIA REPAIR     VASECTOMY     VIDEO ASSISTED THORACOSCOPY (VATS)/WEDGE RESECTION Left 01/14/2015   Procedure: VIDEO ASSISTED THORACOSCOPY (VATS)/WEDGE RESECTION, Superior segmentectomy left  lower lobe, wedge resection of left upper lobe, multiple lymph node disection, On Q insertion.;  Surgeon: Grace Isaac,  MD;  Location: [facility];  Service: Thoracic;  Laterality: Left;   VIDEO BRONCHOSCOPY Bilateral 12/16/2014   Procedure: VIDEO BRONCHOSCOPY WITH FLUORO;  Surgeon: Collene Gobble, MD;  Location: [facility];  Service: Cardiopulmonary;  Laterality: Bilateral;   VIDEO BRONCHOSCOPY N/A 01/14/2015   Procedure: VIDEO BRONCHOSCOPY;  Surgeon: Grace Isaac, MD;  Location: [facility];  Service: Thoracic;  Laterality: N/A;   Social History:  Social History   Socioeconomic History   Marital status: Divorced    Spouse name: Not on file   Number of children: Not on file   Years of education: Not on file   Highest education level: Not on file  Occupational History   Not on file  Tobacco Use   Smoking status: Former    Packs/day: 1.00    Years: 36.00    Pack years: 36.00    Types: Cigarettes    Start date: 12/14/1979    Quit date: 10/07/2015    Years since quitting: 4.7   Smokeless tobacco: Current   Tobacco comments:    Vapes: Few cigars a day. Quit cigarettes after easter.  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 He does have a cat but no other home pets. No mold exposure. Recent travel to [place] but with symptoms at the onset of travel.   Social Determinants of Health   Financial Resource Strain: Not on file  Food Insecurity: Not on file  Transportation Needs: Not on file  Physical Activity: Not on file  Stress: Not on file  Social Connections: Not on file  Intimate Partner Violence: Not on file   Family History:  Family History  Problem Relation Age of Onset   Breast cancer Mother    Colon polyps Mother    Arthritis Father     Review of Systems: Constitutional: Denies fevers, chills or abnormal weight loss Eyes: Denies blurriness of vision Ears, nose, mouth, throat, and face: Denies mucositis or sore throat Respiratory: Denies cough, dyspnea or wheezes Cardiovascular: Denies palpitation, chest discomfort or lower extremity  swelling Gastrointestinal:  Denies nausea, constipation, diarrhea GU: Denies dysuria or incontinence Skin: Denies abnormal skin rashes Neurological: Per HPI Musculoskeletal: Denies joint pain, back or neck discomfort. No decrease in ROM Behavioral/Psych: Denies anxiety, disturbance in thought content, and mood instability   Physical Exam: Vitals:   06/23/20 1115  BP: (!) 142/87  Pulse: 71  Resp: 18  Temp: (!) 97.1 F (36.2 C)  SpO2: 100%   KPS: 80. General: Alert, cooperative, pleasant, in no acute distress Head: Normal EENT: No conjunctival injection or scleral icterus. Oral mucosa moist Lungs: Resp effort normal Cardiac: Regular rate and rhythm Abdomen: Soft, non-distended abdomen Skin: No rashes cyanosis or petechiae. Extremities: No clubbing or edema  Neurologic Exam: Mental Status: Awake, alert, attentive to examiner. Oriented to self and environment. Language is fluent with intact comprehension.  Cranial Nerves: Visual acuity is grossly normal. Visual fields are full. Extra-ocular movements intact. No ptosis. Face is symmetric, tongue midline. Motor:  Tone and bulk are normal. Pronator drift in right, he is 4/5 in right leg. Reflexes are increased on left with mild spasticity and 10 beats right ankle clonus.  Sensory: Intact to light touch and temperature Gait: Hemiparetic gait  Labs: I have reviewed the data as listed    Component Value Date/Time   NA 138 06/15/2020 1031   NA 137 01/12/2017 0846   K 4.5 06/15/2020 1031   K 4.4 01/12/2017 0846   CL 104 06/15/2020 1031   CO2 24 06/15/2020 1031   CO2 18 (L) 01/12/2017 0846   GLUCOSE 107 (H) 06/15/2020 1031   GLUCOSE 91 01/12/2017 0846   BUN 12 06/15/2020 1031   BUN 15.3 01/12/2017 0846   CREATININE 1.06 06/15/2020 1031   CREATININE 1.0 01/12/2017 0846   CALCIUM 9.4 06/15/2020 1031   CALCIUM 9.0 01/12/2017 0846   PROT 7.1 06/15/2020 1031   PROT 7.2 01/12/2017 0846   ALBUMIN 4.0 06/15/2020 1031   ALBUMIN 3.9  01/12/2017 0846   AST 25 06/15/2020 1031   AST 23 01/12/2017 0846   ALT 17 06/15/2020 1031   ALT 22 01/12/2017 0846   ALKPHOS 77 06/15/2020 1031   ALKPHOS 99 01/12/2017 0846   BILITOT 0.3 06/15/2020 1031   BILITOT 0.41 01/12/2017 0846   GFRNONAA >60 06/15/2020 1031   GFRAA >60 06/04/2019 1116   Lab Results  Component Value Date   WBC 8.6 06/15/2020   NEUTROABS 6.6 06/15/2020   HGB 14.5 06/15/2020   HCT 43.3 06/15/2020   MCV 94.1 06/15/2020   PLT 317 06/15/2020    Imaging:  CT Chest W Contrast  Result Date: 06/15/2020 CLINICAL DATA:  Non-small cell lung cancer.  Restaging. EXAM: CT CHEST, ABDOMEN, AND PELVIS WITH CONTRAST TECHNIQUE: Multidetector CT imaging of the chest, abdomen and pelvis was performed following the standard protocol during bolus administration of intravenous contrast. CONTRAST:  115m OMNIPAQUE IOHEXOL 300 MG/ML  SOLN COMPARISON:  12/04/2019 FINDINGS: CT CHEST FINDINGS Cardiovascular: Normal heart size. No pericardial effusion identified. Scattered coronary artery calcifications. Mediastinum/Nodes: Normal appearance of the thyroid gland. The trachea appears patent and is midline. Normal appearance of the esophagus. No enlarged mediastinal or hilar adenopathy. Lungs/Pleura: No pleural effusion. Moderate centrilobular and paraseptal emphysema. No airspace consolidation. Subpleural nodule within the posterior left base is stable measuring 4 mm, image 156/6. Similar appearance of scarring within the posteromedial left lower lobe. Musculoskeletal: Remote right posterior ninth rib fracture the is unchanged, image 101/6. No acute or suspicious osseous findings. CT ABDOMEN PELVIS FINDINGS Hepatobiliary: No focal liver abnormality is seen. No gallstones, gallbladder wall thickening, or biliary dilatation. Pancreas: Unremarkable. No pancreatic ductal dilatation or surrounding inflammatory changes. Spleen: Normal in size without focal abnormality. Adrenals/Urinary Tract: Normal  appearance of the adrenal glands. 7 mm low-density structure arising off the posterior cortex of the upper pole of left kidney is noted, image 68/2. This is too small to characterize but appears unchanged. No hydronephrosis identified bilaterally. Urinary bladder is unremarkable. Stomach/Bowel: Stomach is within normal limits. Appendix appears normal. No evidence of bowel wall thickening, distention, or inflammatory changes. Distal colonic diverticulosis noted without signs of acute inflammation. Vascular/Lymphatic: Aortic atherosclerosis. No aneurysm. No abdominopelvic adenopathy. Reproductive: Prostate is unremarkable. Other: No free fluid or fluid collections. Small fat containing umbilical hernia. Musculoskeletal: No acute or suspicious osseous findings. Degenerative disc disease noted at L5-S1. IMPRESSION: 1. Stable CT of the chest, abdomen and pelvis. No specific findings identified to suggest residual or recurrent tumor or metastatic disease. 2.  4 mm nodule in the left lower lobe is unchanged from previous exam. 3. Coronary artery calcifications. Aortic Atherosclerosis (ICD10-I70.0) and Emphysema (ICD10-J43.9). Electronically Signed   By: Kerby Moors M.D.   On: 06/15/2020 15:43   MR BRAIN W WO CONTRAST  Result Date: 06/19/2020 CLINICAL DATA:  Follow-up metastatic lung cancer treated with surgery and S RS. EXAM: MRI HEAD WITHOUT AND WITH CONTRAST TECHNIQUE: Multiplanar, multiecho pulse sequences of the brain and surrounding structures were obtained without and with intravenous contrast. CONTRAST:  68m MULTIHANCE GADOBENATE DIMEGLUMINE 529 MG/ML IV SOLN COMPARISON:  12/19/2019.  04/09/2019. FINDINGS: Brain: Previous distant left frontoparietal craniotomy and tumor resection with subsequent radiation. The appearance is stable since December of last year. There is gliosis without evidence of edema or mass effect. Retracted scar is stable. At the inferior aspect of the region of enhancement, the slight  rounded predominance has not changed in the last 6 months, arguing strongly against recurrent malignancy. No new brain lesion seen elsewhere. No sign of ischemic infarction, hydrocephalus or extra-axial collection. Vascular: Major vessels at the base of the brain show flow. Skull and upper cervical spine: Negative other than the postoperative changes. Sinuses/Orbits: Clear/normal Other: None IMPRESSION: Stable examination since December. Postsurgical and radiation changes at the left vertex are stable, including the somewhat nodular nature of the post treatment enhancement at the inferior aspect. Lack of progression since December argues strongly against tumor. Electronically Signed   By: MNelson ChimesM.D.   On: 06/19/2020 16:34   CT Abdomen Pelvis W Contrast  Result Date: 06/15/2020 CLINICAL DATA:  Non-small cell lung cancer.  Restaging. EXAM: CT CHEST, ABDOMEN, AND PELVIS WITH CONTRAST TECHNIQUE: Multidetector CT imaging of the chest, abdomen and pelvis was performed following the standard protocol during bolus administration of intravenous contrast. CONTRAST:  1075mOMNIPAQUE IOHEXOL 300 MG/ML  SOLN COMPARISON:  12/04/2019 FINDINGS: CT CHEST FINDINGS Cardiovascular: Normal heart size. No pericardial effusion identified. Scattered coronary artery calcifications. Mediastinum/Nodes: Normal appearance of the thyroid gland. The trachea appears patent and is midline. Normal appearance of the esophagus. No enlarged mediastinal or hilar adenopathy. Lungs/Pleura: No pleural effusion. Moderate centrilobular and paraseptal emphysema. No airspace consolidation. Subpleural nodule within the posterior left base is stable measuring 4 mm, image 156/6. Similar appearance of scarring within the posteromedial left lower lobe. Musculoskeletal: Remote right posterior ninth rib fracture the is unchanged, image 101/6. No acute or suspicious osseous findings. CT ABDOMEN PELVIS FINDINGS Hepatobiliary: No focal liver abnormality is  seen. No gallstones, gallbladder wall thickening, or biliary dilatation. Pancreas: Unremarkable. No pancreatic ductal dilatation or surrounding inflammatory changes. Spleen: Normal in size without focal abnormality. Adrenals/Urinary Tract: Normal appearance of the adrenal glands. 7 mm low-density structure arising off the posterior cortex of the upper pole of left kidney is noted, image 68/2. This is too small to characterize but appears unchanged. No hydronephrosis identified bilaterally. Urinary bladder is unremarkable. Stomach/Bowel: Stomach is within normal limits. Appendix appears normal. No evidence of bowel wall thickening, distention, or inflammatory changes. Distal colonic diverticulosis noted without signs of acute inflammation. Vascular/Lymphatic: Aortic atherosclerosis. No aneurysm. No abdominopelvic adenopathy. Reproductive: Prostate is unremarkable. Other: No free fluid or fluid collections. Small fat containing umbilical hernia. Musculoskeletal: No acute or suspicious osseous findings. Degenerative disc disease noted at L5-S1. IMPRESSION: 1. Stable CT of the chest, abdomen and pelvis. No specific findings identified to suggest residual or recurrent tumor or metastatic disease. 2. 4 mm nodule in the left lower lobe is unchanged from previous exam. 3. Coronary  artery calcifications. Aortic Atherosclerosis (ICD10-I70.0) and Emphysema (ICD10-J43.9). Electronically Signed   By: Kerby Moors M.D.   On: 06/15/2020 15:43     [place] [place] Clinician Interpretation: I have personally reviewed the radiological images as listed.  My interpretation, in the context of the patient's clinical presentation, is stable disease   Assessment/Plan 1. Brain metastasis ([place])  2. Right leg weakness  Mr. Piascik is clinically and radiographically stable today.    We are very encouraged by his clinical status at this time.  We ask that Timothy Obrien return to clinic in 12 months following next brain MRI, or sooner as  needed.  We appreciate the opportunity to participate in the care of Timothy Obrien.  We will maintain open communication regarding future treatment pathways and formal follow up.  All questions were answered. The patient knows to call the clinic with any problems, questions or concerns. No barriers to learning were detected.  The total time spent in the encounter was 30 minutes and more than 50% was on counseling and review of test results   Ventura Sellers, MD Medical Director of Neuro-Oncology [facility] 06/23/20 11:09 AM

## 2020-06-25 ENCOUNTER — Ambulatory Visit: Payer: Medicare Other | Admitting: Internal Medicine

## 2020-07-13 DIAGNOSIS — Z20822 Contact with and (suspected) exposure to covid-19: Secondary | ICD-10-CM | POA: Diagnosis not present

## 2020-08-28 ENCOUNTER — Other Ambulatory Visit: Payer: Self-pay | Admitting: Internal Medicine

## 2020-08-28 DIAGNOSIS — G4700 Insomnia, unspecified: Secondary | ICD-10-CM

## 2020-10-26 DIAGNOSIS — Z23 Encounter for immunization: Secondary | ICD-10-CM | POA: Diagnosis not present

## 2020-11-16 DIAGNOSIS — Z20828 Contact with and (suspected) exposure to other viral communicable diseases: Secondary | ICD-10-CM | POA: Diagnosis not present

## 2020-11-27 ENCOUNTER — Other Ambulatory Visit: Payer: Self-pay | Admitting: Internal Medicine

## 2020-11-27 DIAGNOSIS — G4700 Insomnia, unspecified: Secondary | ICD-10-CM

## 2020-12-14 ENCOUNTER — Telehealth: Payer: Self-pay | Admitting: Medical Oncology

## 2020-12-14 ENCOUNTER — Other Ambulatory Visit: Payer: Medicare Other

## 2020-12-14 NOTE — Telephone Encounter (Signed)
Schedule message sent for lab and f/u appt

## 2020-12-15 ENCOUNTER — Telehealth: Payer: Self-pay | Admitting: Internal Medicine

## 2020-12-15 NOTE — Telephone Encounter (Signed)
Scheduled per sch msg. Called and spoke with patient. Confirmed appt  

## 2020-12-16 ENCOUNTER — Ambulatory Visit: Payer: Medicare Other | Admitting: Internal Medicine

## 2020-12-24 ENCOUNTER — Other Ambulatory Visit: Payer: Self-pay

## 2020-12-24 ENCOUNTER — Ambulatory Visit (HOSPITAL_COMMUNITY)
Admission: RE | Admit: 2020-12-24 | Discharge: 2020-12-24 | Disposition: A | Discharge status: Home / Self Care | Payer: Medicare Other | Source: Ambulatory Visit | Attending: Internal Medicine | Admitting: Internal Medicine

## 2020-12-24 ENCOUNTER — Inpatient Hospital Stay: Payer: Medicare Other | Attending: Internal Medicine

## 2020-12-24 DIAGNOSIS — J439 Emphysema, unspecified: Secondary | ICD-10-CM | POA: Diagnosis not present

## 2020-12-24 DIAGNOSIS — R531 Weakness: Secondary | ICD-10-CM | POA: Diagnosis not present

## 2020-12-24 DIAGNOSIS — I700 Atherosclerosis of aorta: Secondary | ICD-10-CM | POA: Diagnosis not present

## 2020-12-24 DIAGNOSIS — C719 Malignant neoplasm of brain, unspecified: Secondary | ICD-10-CM | POA: Diagnosis not present

## 2020-12-24 DIAGNOSIS — J9811 Atelectasis: Secondary | ICD-10-CM | POA: Diagnosis not present

## 2020-12-24 DIAGNOSIS — K760 Fatty (change of) liver, not elsewhere classified: Secondary | ICD-10-CM | POA: Diagnosis not present

## 2020-12-24 DIAGNOSIS — C3490 Malignant neoplasm of unspecified part of unspecified bronchus or lung: Secondary | ICD-10-CM

## 2020-12-24 LAB — CMP (CANCER CENTER ONLY)
ALT: 23 U/L (ref 0–44)
AST: 29 U/L (ref 15–41)
Albumin: 4.1 g/dL (ref 3.5–5.0)
Alkaline Phosphatase: 77 U/L (ref 38–126)
Anion gap: 10 (ref 5–15)
BUN: 15 mg/dL (ref 6–20)
CO2: 20 mmol/L — ABNORMAL LOW (ref 22–32)
Calcium: 8.7 mg/dL — ABNORMAL LOW (ref 8.9–10.3)
Chloride: 109 mmol/L (ref 98–111)
Creatinine: 0.97 mg/dL (ref 0.61–1.24)
GFR, Estimated: >60 mL/min (ref >60)
Glucose, Bld: 97 mg/dL (ref 70–99)
Potassium: 4.3 mmol/L (ref 3.5–5.1)
Sodium: 139 mmol/L (ref 135–145)
Total Bilirubin: 0.3 mg/dL (ref 0.3–1.2)
Total Protein: 7 g/dL (ref 6.5–8.1)

## 2020-12-24 LAB — CBC WITH DIFFERENTIAL (CANCER CENTER ONLY)
Abs Immature Granulocytes: 0.03 10*3/uL (ref 0.00–0.07)
Basophils Absolute: 0.1 10*3/uL (ref 0.0–0.1)
Basophils Relative: 1 %
Eosinophils Absolute: 0 10*3/uL (ref 0.0–0.5)
Eosinophils Relative: 0 %
HCT: 45.4 % (ref 39.0–52.0)
Hemoglobin: 15.6 g/dL (ref 13.0–17.0)
Immature Granulocytes: 0 %
Lymphocytes Relative: 13 %
Lymphs Abs: 1.3 10*3/uL (ref 0.7–4.0)
MCH: 32.5 pg (ref 26.0–34.0)
MCHC: 34.4 g/dL (ref 30.0–36.0)
MCV: 94.6 fL (ref 80.0–100.0)
Monocytes Absolute: 0.7 10*3/uL (ref 0.1–1.0)
Monocytes Relative: 7 %
Neutro Abs: 8.2 10*3/uL — ABNORMAL HIGH (ref 1.7–7.7)
Neutrophils Relative %: 79 %
Platelet Count: 268 10*3/uL (ref 150–400)
RBC: 4.8 MIL/uL (ref 4.22–5.81)
RDW: 12.6 % (ref 11.5–15.5)
WBC Count: 10.3 10*3/uL (ref 4.0–10.5)
nRBC: 0 % (ref 0.0–0.2)

## 2020-12-24 MED ORDER — IOHEXOL 9 MG/ML PO SOLN: 500.0000 mL | ORAL | Status: AC

## 2020-12-24 MED ORDER — IOHEXOL 350 MG/ML SOLN
80.0000 mL | Freq: Once | INTRAVENOUS | Status: AC | PRN
  Administered 2020-12-24: 80 mL via INTRAVENOUS

## 2020-12-24 MED ORDER — IOHEXOL 9 MG/ML PO SOLN
ORAL | Status: AC
  Filled 2020-12-24: qty 1000

## 2020-12-30 ENCOUNTER — Inpatient Hospital Stay (HOSPITAL_BASED_OUTPATIENT_CLINIC_OR_DEPARTMENT_OTHER): Payer: Medicare Other | Admitting: Internal Medicine

## 2020-12-30 DIAGNOSIS — C3490 Malignant neoplasm of unspecified part of unspecified bronchus or lung: Secondary | ICD-10-CM | POA: Diagnosis not present

## 2020-12-30 DIAGNOSIS — C3432 Malignant neoplasm of lower lobe, left bronchus or lung: Secondary | ICD-10-CM | POA: Diagnosis not present

## 2020-12-30 DIAGNOSIS — C7931 Secondary malignant neoplasm of brain: Secondary | ICD-10-CM

## 2020-12-30 NOTE — Progress Notes (Signed)
[facility] Telephone:([phone]) [phone]   Fax:(336) [phone]  PROGRESS NOTE FOR TELEMEDICINE VISITS  [name], MD [place] [address]  I connected withNAME@ on 12/30/20 at  8:15 AM EST by video enabled telemedicine visit and verified that I am speaking with the correct person using two identifiers.   I discussed the limitations, risks, security and privacy concerns of performing an evaluation and management service by telemedicine and the availability of in-person appointments. I also discussed with the patient that there may be a patient responsible charge related to this service. The patient expressed understanding and agreed to proceed.  Other persons participating in the visit and their role in the encounter:  None  Patient's location:  Home Provider's location: [place]  DIAGNOSIS:   1) Stage IV (T2a, N1, M1b) non-small cell lung cancer, adenocarcinoma, negative EGFR mutation but equivocal EGFR amplification, negative ALK gene translocation and negative ROS 1 but with PDL-1 expression 100% presented with left lower lobe lung mass in addition to left hilar adenopathy and solitary metastatic brain lesion diagnosed in December 2016. 2) nonocclusive right upper lobe pulmonary artery embolism diagnosed on incidental CT scan of the chest on 06/04/2019   PRIOR THERAPY: 1) status post stereotactic radiotherapy and surgical resection diagnosed in December 2016. 2) status post video bronchoscopy with left VATS and wedge resection of the left upper lobe and left lower lobe superior segmentectomy with lymph node dissection under the care of Dr. Servando Snare on 01/14/2015. 3) First-line treatment with immunotherapy with Ketruda (pembrolizumab) 200 mg IV every 3 weeks status post 35 cycles. First cycle was given 02/18/2015.  Discontinued after the patient completed 2 years of treatment. 4) stereotactic left frontal craniotomy for resection of  tumor and the final pathology was consistent with tumor necrosis.  This was done on 05/12/2017 5) Xarelto 15 mg p.o. twice daily for 3 weeks followed by 20 mg p.o. daily.  First dose started 06/04/2019 and discontinued on June 16, 2020     CURRENT THERAPY: Observation.  INTERVAL HISTORY: Timothy Obrien 60 y.o. male has a MyChart virtual video visit with me today for evaluation and discussion of his scan results.  The patient is feeling fine today with no concerning complaints.  He denied having any current chest pain, shortness of breath, cough or hemoptysis.  He denied having any fever or chills.  He has no nausea, vomiting, diarrhea or constipation.  He has no headache or visual changes.  He denied having any significant weight loss or night sweats.  He had repeat CT scan of the chest, abdomen pelvis performed recently and we are having the visit for evaluation and discussion of his scan results.  MEDICAL HISTORY: Past Medical History:  Diagnosis Date   Anxiety    Bronchitis    Chronic fatigue 09/17/2015   Depression 02/11/2016   Hypertension 12/31/2015   states never been told   Lung cancer ([place]) dx'd 2016   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   Pneumonia    Seizures ([facility])    last 12/11/14   Shortness of breath dyspnea    with exertion   Tobacco abuse     ALLERGIES:  has No Known Allergies.  MEDICATIONS:  Current Outpatient Medications  Medication Sig Dispense Refill   acetaminophen (TYLENOL) 500 MG tablet Take 500 mg by mouth 2 (two) times daily as needed.     acetaminophen-codeine (TYLENOL #3) 300-30 MG tablet Take 1 tablet  by mouth every 6 (six) hours as needed for moderate pain. (Patient not taking: No sig reported) 60 tablet 0   aspirin EC 81 MG tablet Take 81 mg by mouth daily. Swallow whole.     ibuprofen (ADVIL,MOTRIN) 600 MG tablet Take 600 mg by mouth 2 (two) times daily as needed.     mirtazapine (REMERON) 30 MG tablet TAKE 1 TABLET BY MOUTH EVERYDAY AT BEDTIME 90 tablet  0   oxyCODONE-acetaminophen (PERCOCET/ROXICET) 5-325 MG tablet Take 1 tablet by mouth every 8 (eight) hours as needed for severe pain. (Patient not taking: Reported on 06/23/2020) 6 tablet 0   XARELTO 20 MG TABS tablet TAKE 1 TABLET BY MOUTH EVERY DAY (Patient not taking: Reported on 06/23/2020) 90 tablet 1   No current facility-administered medications for this visit.    SURGICAL HISTORY:  Past Surgical History:  Procedure Laterality Date   APPLICATION OF CRANIAL NAVIGATION N/A 12/26/2014   Procedure: APPLICATION OF CRANIAL NAVIGATION;  Surgeon: Kevan Ny Ditty, MD;  Location: [facility];  Service: Neurosurgery;  Laterality: N/A;   APPLICATION OF CRANIAL NAVIGATION Left 05/12/2017   Procedure: APPLICATION OF CRANIAL NAVIGATION;  Surgeon: Consuella Lose, MD;  Location: [facility];  Service: Neurosurgery;  Laterality: Left;  APPLICATION OF CRANIAL NAVIGATION   CRANIOTOMY N/A 12/26/2014   Procedure: CRANIOTOMY TUMOR EXCISION with Brain[facility];  Surgeon: Kevan Ny Ditty, MD;  Location: [facility];  Service: Neurosurgery;  Laterality: N/A;  CRANIOTOMY TUMOR EXCISION with [facility]   CRANIOTOMY Left 05/12/2017   Procedure: STEREOTACTIC LEFT FRONTOPERIETIAL CRANIOTOMY FOR RESECTION OF TUMOR;  Surgeon: Consuella Lose, MD;  Location: [facility];  Service: Neurosurgery;  Laterality: Left;  STEREOTACTIC LEFT FRONTOPERIETIAL CRANIOTOMY FOR RESECTION OF TUMOR   HERNIA REPAIR     VASECTOMY     VIDEO ASSISTED THORACOSCOPY (VATS)/WEDGE RESECTION Left 01/14/2015   Procedure: VIDEO ASSISTED THORACOSCOPY (VATS)/WEDGE RESECTION, Superior segmentectomy left  lower lobe, wedge resection of left upper lobe, multiple lymph node disection, On Q insertion.;  Surgeon: Grace Isaac, MD;  Location: [facility];  Service: Thoracic;  Laterality: Left;   VIDEO BRONCHOSCOPY Bilateral 12/16/2014   Procedure: VIDEO BRONCHOSCOPY WITH FLUORO;  Surgeon: Collene Gobble, MD;  Location: [facility];  Service: Cardiopulmonary;   Laterality: Bilateral;   VIDEO BRONCHOSCOPY N/A 01/14/2015   Procedure: VIDEO BRONCHOSCOPY;  Surgeon: Grace Isaac, MD;  Location: [facility];  Service: Thoracic;  Laterality: N/A;    REVIEW OF SYSTEMS:  A comprehensive review of systems was negative.    LABORATORY DATA: Lab Results  Component Value Date   WBC 10.3 12/24/2020   HGB 15.6 12/24/2020   HCT 45.4 12/24/2020   MCV 94.6 12/24/2020   PLT 268 12/24/2020      Chemistry      Component Value Date/Time   NA 139 12/24/2020 0923   NA 137 01/12/2017 0846   K 4.3 12/24/2020 0923   K 4.4 01/12/2017 0846   CL 109 12/24/2020 0923   CO2 20 (L) 12/24/2020 0923   CO2 18 (L) 01/12/2017 0846   BUN 15 12/24/2020 0923   BUN 15.3 01/12/2017 0846   CREATININE 0.97 12/24/2020 0923   CREATININE 1.0 01/12/2017 0846      Component Value Date/Time   CALCIUM 8.7 (L) 12/24/2020 0923   CALCIUM 9.0 01/12/2017 0846   ALKPHOS 77 12/24/2020 0923   ALKPHOS 99 01/12/2017 0846   AST 29 12/24/2020 0923   AST 23 01/12/2017 0846   ALT 23 12/24/2020 0923   ALT  22 01/12/2017 0846   BILITOT 0.3 12/24/2020 0923   BILITOT 0.41 01/12/2017 0846       RADIOGRAPHIC STUDIES: CT Chest W Contrast  Result Date: 12/25/2020 CLINICAL DATA:  A 60 year old male presents for follow-up of non-small cell lung cancer, staging evaluation. EXAM: CT CHEST, ABDOMEN, AND PELVIS WITH CONTRAST TECHNIQUE: Multidetector CT imaging of the chest, abdomen and pelvis was performed following the standard protocol during bolus administration of intravenous contrast. CONTRAST:  71m OMNIPAQUE IOHEXOL 350 MG/ML SOLN COMPARISON:  June 15, 2020 FINDINGS: CT CHEST FINDINGS Cardiovascular: Calcified and noncalcified atheromatous plaque of the thoracic aorta. No aneurysmal dilation. Normal caliber of central pulmonary vessels. Normal heart size without substantial pericardial effusion. Three-vessel coronary artery disease. Mediastinum/Nodes: No thoracic inlet, axillary, mediastinal or hilar  adenopathy. Esophagus grossly normal. Lungs/Pleura: Signs of pulmonary emphysema. No effusion. No consolidation. Airways are patent. Mild scarring in the RIGHT middle lobe is similar to previous imaging. There is basilar atelectasis. Signs of prior partial lung resection in the LEFT chest with similar appearance. Stable 4 mm LEFT lower lobe pulmonary nodule (image 161/7) given stability over 1 year this is more likely benign. Musculoskeletal: See below for full musculoskeletal details. CT ABDOMEN PELVIS FINDINGS Hepatobiliary: No focal, suspicious hepatic lesion. No pericholecystic stranding. No biliary duct dilation. Portal vein is patent. Tiny hypodensity in the LEFT hepatic lobe lateral segment is stable likely a small cyst or hemangioma. Mild hepatic steatosis. Pancreas: Normal, without mass, inflammation or ductal dilatation. Spleen: Spleen normal size and contour. Adrenals/Urinary Tract: Adrenal glands are unremarkable. Symmetric renal enhancement. No sign of hydronephrosis. No suspicious renal lesion or perinephric stranding. Urinary bladder is grossly unremarkable. Stable probable cyst of the upper pole the RIGHT kidney. No hydronephrosis. No perivesical stranding. No ureteral dilation. Stomach/Bowel: No acute gastrointestinal process with normal appendix. 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 Vascular/Lymphatic: Aortic atherosclerosis. No sign of aneurysm. Smooth contour of the IVC. There is no gastrohepatic or hepatoduodenal ligament lymphadenopathy. No retroperitoneal or mesenteric lymphadenopathy. No pelvic sidewall lymphadenopathy. Reproductive: Unremarkable by CT. Other: Small fat containing umbilical hernia. No ascites. No for free air. Musculoskeletal: No acute bone finding. No destructive bone process. Spinal degenerative changes. IMPRESSION: Signs of prior partial lung resection in the LEFT lower lobe with similar appearance. No evidence of  recurrent or metastatic disease. Mild hepatic steatosis. 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 Three-vessel coronary artery Aortic atherosclerosis and pulmonary emphysema. Aortic Atherosclerosis (ICD10-I70.0) and Emphysema (ICD10-J43.9). Electronically Signed   By: GZetta BillsM.D.   On: 12/25/2020 15:53   CT Abdomen Pelvis W Contrast  Result Date: 12/25/2020 CLINICAL DATA:  A 60year old male presents for follow-up of non-small cell lung cancer, staging evaluation. EXAM: CT CHEST, ABDOMEN, AND PELVIS WITH CONTRAST TECHNIQUE: Multidetector CT imaging of the chest, abdomen and pelvis was performed following the standard protocol during bolus administration of intravenous contrast. CONTRAST:  831mOMNIPAQUE IOHEXOL 350 MG/ML SOLN COMPARISON:  June 15, 2020 FINDINGS: CT CHEST FINDINGS Cardiovascular: Calcified and noncalcified atheromatous plaque of the thoracic aorta. No aneurysmal dilation. Normal caliber of central pulmonary vessels. Normal heart size without substantial pericardial effusion. Three-vessel coronary artery disease. Mediastinum/Nodes: No thoracic inlet, axillary, mediastinal or hilar adenopathy. Esophagus grossly normal. Lungs/Pleura: Signs of pulmonary emphysema. No effusion. No consolidation. Airways are patent. Mild scarring in the RIGHT middle lobe is similar to previous imaging. There is basilar atelectasis. Signs of prior partial lung resection in the LEFT chest  with similar appearance. Stable 4 mm LEFT lower lobe pulmonary nodule (image 161/7) given stability over 1 year this is more likely benign. Musculoskeletal: See below for full musculoskeletal details. CT ABDOMEN PELVIS FINDINGS Hepatobiliary: No focal, suspicious hepatic lesion. No pericholecystic stranding. No biliary duct dilation. Portal vein is patent. Tiny hypodensity in the LEFT hepatic lobe lateral segment is stable likely a small cyst or hemangioma. Mild hepatic  steatosis. Pancreas: Normal, without mass, inflammation or ductal dilatation. Spleen: Spleen normal size and contour. Adrenals/Urinary Tract: Adrenal glands are unremarkable. Symmetric renal enhancement. No sign of hydronephrosis. No suspicious renal lesion or perinephric stranding. Urinary bladder is grossly unremarkable. Stable probable cyst of the upper pole the RIGHT kidney. No hydronephrosis. No perivesical stranding. No ureteral dilation. Stomach/Bowel: No acute gastrointestinal process with normal appendix. 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 Vascular/Lymphatic: Aortic atherosclerosis. No sign of aneurysm. Smooth contour of the IVC. There is no gastrohepatic or hepatoduodenal ligament lymphadenopathy. No retroperitoneal or mesenteric lymphadenopathy. No pelvic sidewall lymphadenopathy. Reproductive: Unremarkable by CT. Other: Small fat containing umbilical hernia. No ascites. No for free air. Musculoskeletal: No acute bone finding. No destructive bone process. Spinal degenerative changes. IMPRESSION: Signs of prior partial lung resection in the LEFT lower lobe with similar appearance. No evidence of recurrent or metastatic disease. Mild hepatic steatosis. 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 Three-vessel coronary artery Aortic atherosclerosis and pulmonary emphysema. Aortic Atherosclerosis (ICD10-I70.0) and Emphysema (ICD10-J43.9). Electronically Signed   By: Zetta Bills M.D.   On: 12/25/2020 15:53    ASSESSMENT AND PLAN: This is a very pleasant 60 years old white male with metastatic non-small cell lung cancer, adenocarcinoma with positive PDL 1 expression of 100%.  This was diagnosed in December 2016 and presented with left lower lobe lung mass in addition to left hilar adenopathy and solitary brain metastasis. He completed treatment with Ketruda (pembrolizumab) 200 mg IV every 3 weeks,  status post 35 cycles, a total of 2 years. The patient has been in observation since that time and he is feeling fine today with no concerning complaints. He had repeat CT scan of the chest, abdomen pelvis performed recently.  I personally and independently reviewed the scan and discussed the results with the patient today. His scan showed no concerning findings for disease recurrence or metastasis. I recommended for the patient to continue on observation with repeat CT scan of the chest, abdomen pelvis in 6 months. He will come back for follow-up visit at that time. The patient was advised to call immediately if he has any other concerning symptoms in the interval. I discussed the assessment and treatment plan with the patient. The patient was provided an opportunity to ask questions and all were answered. The patient agreed with the plan and demonstrated an understanding of the instructions.   The patient was advised to call back or seek an in-person evaluation if the symptoms worsen or if the condition fails to improve as anticipated.  I provided 15 minutes of face-to-face video visit time during this encounter, and > 50% was spent counseling as documented under my assessment & plan.  Eilleen Kempf, MD 12/30/2020 8:32 AM  Disclaimer: This note was dictated with voice recognition software. Similar sounding words can inadvertently be transcribed and may not be corrected upon review.

## 2021-01-08 ENCOUNTER — Telehealth: Payer: Self-pay | Admitting: Internal Medicine

## 2021-01-08 NOTE — Telephone Encounter (Signed)
Sch per 12/21 los, left msg °

## 2021-02-12 DIAGNOSIS — Z20822 Contact with and (suspected) exposure to covid-19: Secondary | ICD-10-CM | POA: Diagnosis not present

## 2021-02-25 ENCOUNTER — Other Ambulatory Visit: Payer: Self-pay | Admitting: Internal Medicine

## 2021-02-25 DIAGNOSIS — G4700 Insomnia, unspecified: Secondary | ICD-10-CM

## 2021-04-26 DIAGNOSIS — Z20822 Contact with and (suspected) exposure to covid-19: Secondary | ICD-10-CM | POA: Diagnosis not present

## 2021-05-18 ENCOUNTER — Other Ambulatory Visit: Payer: Self-pay | Admitting: Radiation Therapy

## 2021-06-03 DIAGNOSIS — I2510 Atherosclerotic heart disease of native coronary artery without angina pectoris: Secondary | ICD-10-CM | POA: Diagnosis not present

## 2021-06-03 DIAGNOSIS — M109 Gout, unspecified: Secondary | ICD-10-CM | POA: Diagnosis not present

## 2021-06-03 DIAGNOSIS — Z0000 Encounter for general adult medical examination without abnormal findings: Secondary | ICD-10-CM | POA: Diagnosis not present

## 2021-06-03 DIAGNOSIS — J439 Emphysema, unspecified: Secondary | ICD-10-CM | POA: Diagnosis not present

## 2021-06-03 DIAGNOSIS — Z1211 Encounter for screening for malignant neoplasm of colon: Secondary | ICD-10-CM | POA: Diagnosis not present

## 2021-06-03 DIAGNOSIS — I700 Atherosclerosis of aorta: Secondary | ICD-10-CM | POA: Diagnosis not present

## 2021-06-03 DIAGNOSIS — E785 Hyperlipidemia, unspecified: Secondary | ICD-10-CM | POA: Diagnosis not present

## 2021-06-21 ENCOUNTER — Ambulatory Visit
Admission: RE | Admit: 2021-06-21 | Discharge: 2021-06-21 | Disposition: A | Discharge status: Home / Self Care | Payer: Medicare Other | Source: Ambulatory Visit | Attending: Internal Medicine | Admitting: Internal Medicine

## 2021-06-21 DIAGNOSIS — Z85841 Personal history of malignant neoplasm of brain: Secondary | ICD-10-CM | POA: Diagnosis not present

## 2021-06-21 DIAGNOSIS — C7931 Secondary malignant neoplasm of brain: Secondary | ICD-10-CM | POA: Diagnosis not present

## 2021-06-21 DIAGNOSIS — D496 Neoplasm of unspecified behavior of brain: Secondary | ICD-10-CM | POA: Diagnosis not present

## 2021-06-21 DIAGNOSIS — G9389 Other specified disorders of brain: Secondary | ICD-10-CM | POA: Diagnosis not present

## 2021-06-21 MED ORDER — GADOBENATE DIMEGLUMINE 529 MG/ML IV SOLN
17.0000 mL | Freq: Once | INTRAVENOUS | Status: AC | PRN
  Administered 2021-06-21: 17 mL via INTRAVENOUS

## 2021-06-25 ENCOUNTER — Other Ambulatory Visit: Payer: Self-pay

## 2021-06-25 ENCOUNTER — Encounter (HOSPITAL_COMMUNITY): Payer: Self-pay

## 2021-06-25 ENCOUNTER — Other Ambulatory Visit: Payer: Medicare Other

## 2021-06-25 ENCOUNTER — Ambulatory Visit (HOSPITAL_COMMUNITY)
Admission: RE | Admit: 2021-06-25 | Discharge: 2021-06-25 | Disposition: A | Discharge status: Home / Self Care | Payer: Medicare Other | Source: Ambulatory Visit | Attending: Internal Medicine | Admitting: Internal Medicine

## 2021-06-25 ENCOUNTER — Inpatient Hospital Stay: Payer: Medicare Other | Attending: Internal Medicine

## 2021-06-25 DIAGNOSIS — I700 Atherosclerosis of aorta: Secondary | ICD-10-CM | POA: Diagnosis not present

## 2021-06-25 DIAGNOSIS — C3490 Malignant neoplasm of unspecified part of unspecified bronchus or lung: Secondary | ICD-10-CM | POA: Insufficient documentation

## 2021-06-25 DIAGNOSIS — Z86711 Personal history of pulmonary embolism: Secondary | ICD-10-CM | POA: Diagnosis not present

## 2021-06-25 DIAGNOSIS — Z85118 Personal history of other malignant neoplasm of bronchus and lung: Secondary | ICD-10-CM | POA: Diagnosis not present

## 2021-06-25 DIAGNOSIS — K5730 Diverticulosis of large intestine without perforation or abscess without bleeding: Secondary | ICD-10-CM | POA: Diagnosis not present

## 2021-06-25 DIAGNOSIS — J439 Emphysema, unspecified: Secondary | ICD-10-CM | POA: Diagnosis not present

## 2021-06-25 DIAGNOSIS — K760 Fatty (change of) liver, not elsewhere classified: Secondary | ICD-10-CM | POA: Diagnosis not present

## 2021-06-25 LAB — CBC WITH DIFFERENTIAL (CANCER CENTER ONLY)
Abs Immature Granulocytes: 0.02 10*3/uL (ref 0.00–0.07)
Basophils Absolute: 0.1 10*3/uL (ref 0.0–0.1)
Basophils Relative: 1 %
Eosinophils Absolute: 0.1 10*3/uL (ref 0.0–0.5)
Eosinophils Relative: 1 %
HCT: 43 % (ref 39.0–52.0)
Hemoglobin: 15.1 g/dL (ref 13.0–17.0)
Immature Granulocytes: 0 %
Lymphocytes Relative: 21 %
Lymphs Abs: 1.4 10*3/uL (ref 0.7–4.0)
MCH: 33.3 pg (ref 26.0–34.0)
MCHC: 35.1 g/dL (ref 30.0–36.0)
MCV: 94.7 fL (ref 80.0–100.0)
Monocytes Absolute: 0.6 10*3/uL (ref 0.1–1.0)
Monocytes Relative: 10 %
Neutro Abs: 4.5 10*3/uL (ref 1.7–7.7)
Neutrophils Relative %: 67 %
Platelet Count: 279 10*3/uL (ref 150–400)
RBC: 4.54 MIL/uL (ref 4.22–5.81)
RDW: 13 % (ref 11.5–15.5)
WBC Count: 6.7 10*3/uL (ref 4.0–10.5)
nRBC: 0 % (ref 0.0–0.2)

## 2021-06-25 LAB — CMP (CANCER CENTER ONLY)
ALT: 17 U/L (ref 0–44)
AST: 26 U/L (ref 15–41)
Albumin: 4.4 g/dL (ref 3.5–5.0)
Alkaline Phosphatase: 71 U/L (ref 38–126)
Anion gap: 8 (ref 5–15)
BUN: 15 mg/dL (ref 6–20)
CO2: 20 mmol/L — ABNORMAL LOW (ref 22–32)
Calcium: 9.1 mg/dL (ref 8.9–10.3)
Chloride: 109 mmol/L (ref 98–111)
Creatinine: 0.93 mg/dL (ref 0.61–1.24)
GFR, Estimated: >60 mL/min (ref >60)
Glucose, Bld: 102 mg/dL — ABNORMAL HIGH (ref 70–99)
Potassium: 4.2 mmol/L (ref 3.5–5.1)
Sodium: 137 mmol/L (ref 135–145)
Total Bilirubin: 0.3 mg/dL (ref 0.3–1.2)
Total Protein: 7.1 g/dL (ref 6.5–8.1)

## 2021-06-25 MED ORDER — IOHEXOL 300 MG/ML  SOLN
100.0000 mL | Freq: Once | INTRAMUSCULAR | Status: AC | PRN
  Administered 2021-06-25: 100 mL via INTRAVENOUS

## 2021-06-25 MED ORDER — SODIUM CHLORIDE (PF) 0.9 % IJ SOLN
INTRAMUSCULAR | Status: AC
  Filled 2021-06-25: qty 50

## 2021-06-25 MED ORDER — IOHEXOL 9 MG/ML PO SOLN
ORAL | Status: AC
  Filled 2021-06-25: qty 1000

## 2021-06-28 ENCOUNTER — Inpatient Hospital Stay: Payer: Medicare Other

## 2021-06-30 ENCOUNTER — Inpatient Hospital Stay: Payer: Medicare Other | Admitting: Internal Medicine

## 2021-06-30 ENCOUNTER — Inpatient Hospital Stay (HOSPITAL_BASED_OUTPATIENT_CLINIC_OR_DEPARTMENT_OTHER): Payer: Medicare Other | Admitting: Internal Medicine

## 2021-06-30 DIAGNOSIS — C3490 Malignant neoplasm of unspecified part of unspecified bronchus or lung: Secondary | ICD-10-CM | POA: Diagnosis not present

## 2021-06-30 NOTE — Progress Notes (Signed)
[place] Telephone:([phone]   [phone]  PROGRESS NOTE FOR TELEMEDICINE VISITS  [name], MD [address] [place] [address]  I connected withNAME@ on 06/30/21 at  8:15 AM EDT by telephone visit and verified that I am speaking with the correct person using two identifiers.   I discussed the limitations, risks, security and privacy concerns of performing an evaluation and management service by telemedicine and the availability of in-person appointments. I also discussed with the patient that there may be a patient responsible charge related to this service. The patient expressed understanding and agreed to proceed.  Other persons participating in the visit and their role in the encounter:  None  Patient's location:  Home Provider's location: [place].  DIAGNOSIS:   1) Stage IV (T2a, N1, M1b) non-small cell lung cancer, adenocarcinoma, negative EGFR mutation but equivocal EGFR amplification, negative ALK gene translocation and negative ROS 1 but with PDL-1 expression 100% presented with left lower lobe lung mass in addition to left hilar adenopathy and solitary metastatic brain lesion diagnosed in December 2016. 2) nonocclusive right upper lobe pulmonary artery embolism diagnosed on incidental CT scan of the chest on 06/04/2019   PRIOR THERAPY: 1) status post stereotactic radiotherapy and surgical resection diagnosed in December 2016. 2) status post video bronchoscopy with left VATS and wedge resection of the left upper lobe and left lower lobe superior segmentectomy with lymph node dissection under the care of Dr. Servando Snare on 01/14/2015. 3) First-line treatment with immunotherapy with Ketruda (pembrolizumab) 200 mg IV every 3 weeks status post 35 cycles. First cycle was given 02/18/2015.  Discontinued after the patient completed 2 years of treatment. 4) stereotactic left frontal craniotomy for resection of tumor and the  final pathology was consistent with tumor necrosis.  This was done on 05/12/2017 5) Xarelto 15 mg p.o. twice daily for 3 weeks followed by 20 mg p.o. daily.  First dose started 06/04/2019 and discontinued on June 16, 2020     CURRENT THERAPY: Observation.  INTERVAL HISTORY: Timothy Obrien 61 y.o. male has a telephone virtual visit with me today for evaluation and discussion of his scan results.  The patient is feeling fine today with no concerning complaints.  He denied having any current chest pain, shortness of breath, cough or hemoptysis.  He has no nausea, vomiting, diarrhea or constipation.  He has no headache or visual changes.  He denied having any significant weight loss or night sweats.  He had repeat CT scan of the chest, abdomen pelvis performed recently and we or having the visit for discussion of his scan results and treatment options.  MEDICAL HISTORY: Past Medical History:  Diagnosis Date   Anxiety    Bronchitis    Chronic fatigue 09/17/2015   Depression 02/11/2016   Hypertension 12/31/2015   states never been told   Lung cancer ([facility]) dx'd 2016   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   Pneumonia    Seizures ([place])    last 12/11/14   Shortness of breath dyspnea    with exertion   Tobacco abuse     ALLERGIES:  has No Known Allergies.  MEDICATIONS:  Current Outpatient Medications  Medication Sig Dispense Refill   acetaminophen (TYLENOL) 500 MG tablet Take 500 mg by mouth 2 (two) times daily as needed.     acetaminophen-codeine (TYLENOL #3) 300-30 MG tablet Take 1 tablet by mouth every 6 (six) hours as needed for moderate  pain. (Patient not taking: No sig reported) 60 tablet 0   aspirin EC 81 MG tablet Take 81 mg by mouth daily. Swallow whole.     ibuprofen (ADVIL,MOTRIN) 600 MG tablet Take 600 mg by mouth 2 (two) times daily as needed.     mirtazapine (REMERON) 30 MG tablet TAKE 1 TABLET BY MOUTH EVERYDAY AT BEDTIME 90 tablet 0   oxyCODONE-acetaminophen (PERCOCET/ROXICET)  5-325 MG tablet Take 1 tablet by mouth every 8 (eight) hours as needed for severe pain. (Patient not taking: Reported on 06/23/2020) 6 tablet 0   XARELTO 20 MG TABS tablet TAKE 1 TABLET BY MOUTH EVERY DAY (Patient not taking: Reported on 06/23/2020) 90 tablet 1   No current facility-administered medications for this visit.    SURGICAL HISTORY:  Past Surgical History:  Procedure Laterality Date   APPLICATION OF CRANIAL NAVIGATION N/A 12/26/2014   Procedure: APPLICATION OF CRANIAL NAVIGATION;  Surgeon: Kevan Ny Ditty, MD;  Location: [facility];  Service: Neurosurgery;  Laterality: N/A;   APPLICATION OF CRANIAL NAVIGATION Left 05/12/2017   Procedure: APPLICATION OF CRANIAL NAVIGATION;  Surgeon: Consuella Lose, MD;  Location: [facility];  Service: Neurosurgery;  Laterality: Left;  APPLICATION OF CRANIAL NAVIGATION   CRANIOTOMY N/A 12/26/2014   Procedure: CRANIOTOMY TUMOR EXCISION with BrainLab;  Surgeon: Kevan Ny Ditty, MD;  Location: [facility];  Service: Neurosurgery;  Laterality: N/A;  CRANIOTOMY TUMOR EXCISION with [facility]   CRANIOTOMY Left 05/12/2017   Procedure: STEREOTACTIC LEFT FRONTOPERIETIAL CRANIOTOMY FOR RESECTION OF TUMOR;  Surgeon: Consuella Lose, MD;  Location: [facility];  Service: Neurosurgery;  Laterality: Left;  STEREOTACTIC LEFT FRONTOPERIETIAL CRANIOTOMY FOR RESECTION OF TUMOR   HERNIA REPAIR     VASECTOMY     VIDEO ASSISTED THORACOSCOPY (VATS)/WEDGE RESECTION Left 01/14/2015   Procedure: VIDEO ASSISTED THORACOSCOPY (VATS)/WEDGE RESECTION, Superior segmentectomy left  lower lobe, wedge resection of left upper lobe, multiple lymph node disection, On Q insertion.;  Surgeon: Grace Isaac, MD;  Location: [facility];  Service: Thoracic;  Laterality: Left;   VIDEO BRONCHOSCOPY Bilateral 12/16/2014   Procedure: VIDEO BRONCHOSCOPY WITH FLUORO;  Surgeon: Collene Gobble, MD;  Location: [place];  Service: Cardiopulmonary;  Laterality: Bilateral;   VIDEO BRONCHOSCOPY N/A  01/14/2015   Procedure: VIDEO BRONCHOSCOPY;  Surgeon: Grace Isaac, MD;  Location[facility];  Service: Thoracic;  Laterality: N/A;    REVIEW OF SYSTEMS:  A comprehensive review of systems was negative.   LABORATORY DATA: Lab Results  Component Value Date   WBC 6.7 06/25/2021   HGB 15.1 06/25/2021   HCT 43.0 06/25/2021   MCV 94.7 06/25/2021   PLT 279 06/25/2021      Chemistry      Component Value Date/Time   NA 137 06/25/2021 1007   NA 137 01/12/2017 0846   K 4.2 06/25/2021 1007   K 4.4 01/12/2017 0846   CL 109 06/25/2021 1007   CO2 20 (L) 06/25/2021 1007   CO2 18 (L) 01/12/2017 0846   BUN 15 06/25/2021 1007   BUN 15.3 01/12/2017 0846   CREATININE 0.93 06/25/2021 1007   CREATININE 1.0 01/12/2017 0846      Component Value Date/Time   CALCIUM 9.1 06/25/2021 1007   CALCIUM 9.0 01/12/2017 0846   ALKPHOS 71 06/25/2021 1007   ALKPHOS 99 01/12/2017 0846   AST 26 06/25/2021 1007   AST 23 01/12/2017 0846   ALT 17 06/25/2021 1007   ALT 22 01/12/2017 0846   BILITOT 0.3 06/25/2021 1007   BILITOT  0.41 01/12/2017 0846       RADIOGRAPHIC STUDIES: CT Chest W Contrast  Result Date: 06/28/2021 CLINICAL DATA:  Metastatic non-small cell lung cancer restaging * Tracking Code: BO * EXAM: CT CHEST, ABDOMEN, AND PELVIS WITH CONTRAST TECHNIQUE: Multidetector CT imaging of the chest, abdomen and pelvis was performed following the standard protocol during bolus administration of intravenous contrast. RADIATION DOSE REDUCTION: This exam was performed according to the departmental dose-optimization program which includes automated exposure control, adjustment of the mA and/or kV according to patient size and/or use of iterative reconstruction technique. CONTRAST:  135m OMNIPAQUE IOHEXOL 300 MG/ML SOLN, additional oral enteric contrast COMPARISON:  12/24/2020 FINDINGS: CT CHEST FINDINGS Cardiovascular: Scattered aortic atherosclerosis. Normal heart size. Left and right coronary artery  calcifications. No pericardial effusion. Mediastinum/Nodes: No enlarged mediastinal, hilar, or axillary lymph nodes. Thyroid gland, trachea, and esophagus demonstrate no significant findings. Lungs/Pleura: Moderate centrilobular emphysema. Unchanged wedge resection of the superior segment left lower lobe. No pleural effusion or pneumothorax. Musculoskeletal: No chest wall mass or suspicious osseous lesions identified. Unchanged chronic fracture deformity of the posterior right ninth rib (series 6, image 94). CT ABDOMEN PELVIS FINDINGS Hepatobiliary: No solid liver abnormality is seen. Hepatic steatosis. No gallstones, gallbladder wall thickening, or biliary dilatation. Pancreas: Unremarkable. No pancreatic ductal dilatation or surrounding inflammatory changes. Spleen: Normal in size without significant abnormality. Adrenals/Urinary Tract: Adrenal glands are unremarkable. Kidneys are normal, without renal calculi, solid lesion, or hydronephrosis. Bladder is unremarkable. Stomach/Bowel: Stomach is within normal limits. Appendix appears normal. No evidence of bowel wall thickening, distention, or inflammatory changes. Sigmoid diverticula. Vascular/Lymphatic: No significant vascular findings are present. No enlarged abdominal or pelvic lymph nodes. Reproductive: No mass or other abnormality. Other: Small fat containing umbilical hernia (series 2, image 89). Evidence of prior right inguinal hernia repair. No ascites. Musculoskeletal: No acute osseous findings. IMPRESSION: 1. Unchanged postoperative findings with wedge resection of the superior segment left lower lobe. 2. No evidence of recurrent or metastatic disease in the chest, abdomen, or pelvis. 3. Emphysema. 4. Coronary artery disease. 5. Hepatic steatosis. 6. Sigmoid diverticulosis. Aortic Atherosclerosis (ICD10-I70.0) and Emphysema (ICD10-J43.9). Electronically Signed   By: ADelanna AhmadiM.D.   On: 06/28/2021 08:26   CT Abdomen Pelvis W Contrast  Result Date:  06/28/2021 CLINICAL DATA:  Metastatic non-small cell lung cancer restaging * Tracking Code: BO * EXAM: CT CHEST, ABDOMEN, AND PELVIS WITH CONTRAST TECHNIQUE: Multidetector CT imaging of the chest, abdomen and pelvis was performed following the standard protocol during bolus administration of intravenous contrast. RADIATION DOSE REDUCTION: This exam was performed according to the departmental dose-optimization program which includes automated exposure control, adjustment of the mA and/or kV according to patient size and/or use of iterative reconstruction technique. CONTRAST:  10104mOMNIPAQUE IOHEXOL 300 MG/ML SOLN, additional oral enteric contrast COMPARISON:  12/24/2020 FINDINGS: CT CHEST FINDINGS Cardiovascular: Scattered aortic atherosclerosis. Normal heart size. Left and right coronary artery calcifications. No pericardial effusion. Mediastinum/Nodes: No enlarged mediastinal, hilar, or axillary lymph nodes. Thyroid gland, trachea, and esophagus demonstrate no significant findings. Lungs/Pleura: Moderate centrilobular emphysema. Unchanged wedge resection of the superior segment left lower lobe. No pleural effusion or pneumothorax. Musculoskeletal: No chest wall mass or suspicious osseous lesions identified. Unchanged chronic fracture deformity of the posterior right ninth rib (series 6, image 94). CT ABDOMEN PELVIS FINDINGS Hepatobiliary: No solid liver abnormality is seen. Hepatic steatosis. No gallstones, gallbladder wall thickening, or biliary dilatation. Pancreas: Unremarkable. No pancreatic ductal dilatation or surrounding inflammatory changes. Spleen: Normal in size without significant  abnormality. Adrenals/Urinary Tract: Adrenal glands are unremarkable. Kidneys are normal, without renal calculi, solid lesion, or hydronephrosis. Bladder is unremarkable. Stomach/Bowel: Stomach is within normal limits. Appendix appears normal. No evidence of bowel wall thickening, distention, or inflammatory changes. Sigmoid  diverticula. Vascular/Lymphatic: No significant vascular findings are present. No enlarged abdominal or pelvic lymph nodes. Reproductive: No mass or other abnormality. Other: Small fat containing umbilical hernia (series 2, image 89). Evidence of prior right inguinal hernia repair. No ascites. Musculoskeletal: No acute osseous findings. IMPRESSION: 1. Unchanged postoperative findings with wedge resection of the superior segment left lower lobe. 2. No evidence of recurrent or metastatic disease in the chest, abdomen, or pelvis. 3. Emphysema. 4. Coronary artery disease. 5. Hepatic steatosis. 6. Sigmoid diverticulosis. Aortic Atherosclerosis (ICD10-I70.0) and Emphysema (ICD10-J43.9). Electronically Signed   By: Delanna Ahmadi M.D.   On: 06/28/2021 08:26   MR BRAIN W WO CONTRAST  Result Date: 06/22/2021 CLINICAL DATA:  Malignant neoplasm metastatic to brain [facility]) C79.31 (ICD-10-CM). Brain/CNS neoplasm, assess treatment response. EXAM: MRI HEAD WITHOUT AND WITH CONTRAST TECHNIQUE: Multiplanar, multiecho pulse sequences of the brain and surrounding structures were obtained without and with intravenous contrast. CONTRAST:  10m MULTIHANCE GADOBENATE DIMEGLUMINE 529 MG/ML IV SOLN COMPARISON:  MRI of the brain June 20, 2018. FINDINGS: Brain: No acute infarction, hemorrhage, hydrocephalus or extra-axial collection. Posttreatment changes from tumor resection and radiation in the posterior left frontal lobe with stable shape and size of the surgical cavity and surrounding gliosis. Postcontrast appearance is also unchanged with stable size of the peripherally enhancing rounded component. No new focus of abnormal contrast enhancement identified. Vascular: Normal flow voids. Skull and upper cervical spine: Postsurgical changes from left foraminal tummy. No focal marrow lesion identified. Sinuses/Orbits: Mucous retention cyst in the right maxillary sinus. The orbits are maintained. Other: None. IMPRESSION: Stable posttreatment  changes in the left frontal lobe when compared to prior MRI. No new focus of abnormal contrast enhancement identified. Electronically Signed   By: KPedro EarlsM.D.   On: 06/22/2021 14:15    ASSESSMENT AND PLAN: This is a very pleasant 61years old white male with metastatic non-small cell lung cancer, adenocarcinoma with positive PDL 1 expression of 100%.  This was diagnosed in December 2016 and presented with left lower lobe lung mass in addition to left hilar adenopathy and solitary brain metastasis. He completed treatment with Ketruda (pembrolizumab) 200 mg IV every 3 weeks, status post 35 cycles, a total of 2 years. The patient has been in observation now for few years with no concerning findings for disease recurrence or complaints. He had repeat CT scan of the chest, abdomen and pelvis performed recently.  I personally and independently reviewed the scans and discussed the results with the patient today. His scan showed no concerning findings for disease progression. I recommended for him to continue on observation with repeat CT scan of the chest, abdomen and pelvis in 6 months. The patient was advised to call immediately if he has any other concerning symptoms in the interval. I discussed the assessment and treatment plan with the patient. The patient was provided an opportunity to ask questions and all were answered. The patient agreed with the plan and demonstrated an understanding of the instructions.   The patient was advised to call back or seek an in-person evaluation if the symptoms worsen or if the condition fails to improve as anticipated.  I provided 15 minutes of non face-to-face telephone visit time during this encounter, and > 50%  was spent counseling as documented under my assessment & plan.  Eilleen Kempf, MD 06/30/2021 8:27 AM  Disclaimer: This note was dictated with voice recognition software. Similar sounding words can inadvertently be transcribed and  may not be corrected upon review.

## 2021-07-06 ENCOUNTER — Inpatient Hospital Stay (HOSPITAL_BASED_OUTPATIENT_CLINIC_OR_DEPARTMENT_OTHER): Payer: Medicare Other | Admitting: Internal Medicine

## 2021-07-06 DIAGNOSIS — C7931 Secondary malignant neoplasm of brain: Secondary | ICD-10-CM | POA: Diagnosis not present

## 2021-08-03 ENCOUNTER — Telehealth: Payer: Self-pay | Admitting: Internal Medicine

## 2021-08-03 NOTE — Telephone Encounter (Signed)
Called patient regarding upcoming December appointments, patient has been called and voicemail was left.

## 2021-10-11 DIAGNOSIS — Z23 Encounter for immunization: Secondary | ICD-10-CM | POA: Diagnosis not present

## 2021-12-15 ENCOUNTER — Telehealth: Payer: Self-pay | Admitting: Medical Oncology

## 2021-12-15 NOTE — Telephone Encounter (Signed)
Lab appt moved on day of CT . F/u appt changed to video visit.

## 2021-12-28 ENCOUNTER — Inpatient Hospital Stay: Payer: Medicare Other | Attending: Internal Medicine

## 2021-12-28 ENCOUNTER — Ambulatory Visit (HOSPITAL_COMMUNITY)
Admission: RE | Admit: 2021-12-28 | Discharge: 2021-12-28 | Disposition: A | Discharge status: Home / Self Care | Payer: Medicare Other | Source: Ambulatory Visit | Attending: Internal Medicine | Admitting: Internal Medicine

## 2021-12-28 DIAGNOSIS — C3490 Malignant neoplasm of unspecified part of unspecified bronchus or lung: Secondary | ICD-10-CM | POA: Insufficient documentation

## 2021-12-28 DIAGNOSIS — N2889 Other specified disorders of kidney and ureter: Secondary | ICD-10-CM | POA: Diagnosis not present

## 2021-12-28 DIAGNOSIS — J439 Emphysema, unspecified: Secondary | ICD-10-CM | POA: Diagnosis not present

## 2021-12-28 DIAGNOSIS — C3432 Malignant neoplasm of lower lobe, left bronchus or lung: Secondary | ICD-10-CM | POA: Diagnosis not present

## 2021-12-28 DIAGNOSIS — K769 Liver disease, unspecified: Secondary | ICD-10-CM | POA: Diagnosis not present

## 2021-12-28 LAB — POCT I-STAT CREATININE: Creatinine, Ser: 1.4 mg/dL — ABNORMAL HIGH (ref 0.61–1.24)

## 2021-12-28 MED ORDER — SODIUM CHLORIDE (PF) 0.9 % IJ SOLN
INTRAMUSCULAR | Status: AC
  Filled 2021-12-28: qty 50

## 2021-12-28 MED ORDER — IOHEXOL 300 MG/ML  SOLN
100.0000 mL | Freq: Once | INTRAMUSCULAR | Status: AC | PRN
  Administered 2021-12-28: 100 mL via INTRAVENOUS

## 2021-12-30 ENCOUNTER — Inpatient Hospital Stay (HOSPITAL_BASED_OUTPATIENT_CLINIC_OR_DEPARTMENT_OTHER): Payer: Medicare Other | Admitting: Internal Medicine

## 2021-12-30 DIAGNOSIS — C3490 Malignant neoplasm of unspecified part of unspecified bronchus or lung: Secondary | ICD-10-CM

## 2021-12-30 NOTE — Progress Notes (Signed)
[place] [phone]   Fax:([phone]) [phone]  PROGRESS NOTE FOR TELEMEDICINE VISITS  [name], MD [place] [address]  I connected withNAME@ on 12/30/21 at  1:45 PM EST by video enabled telemedicine visit and verified that I am speaking with the correct person using two identifiers.   I discussed the limitations, risks, security and privacy concerns of performing an evaluation and management service by telemedicine and the availability of in-person appointments. I also discussed with the patient that there may be a patient responsible charge related to this service. The patient expressed understanding and agreed to proceed.  Other persons participating in the visit and their role in the encounter: None  Patient's location: Home Provider's location: [place]  DIAGNOSIS:   1) Stage IV (T2a, N1, M1b) non-small cell lung cancer, adenocarcinoma, negative EGFR mutation but equivocal EGFR amplification, negative ALK gene translocation and negative ROS 1 but with PDL-1 expression 100% presented with left lower lobe lung mass in addition to left hilar adenopathy and solitary metastatic brain lesion diagnosed in December 2016. 2) nonocclusive right upper lobe pulmonary artery embolism diagnosed on incidental CT scan of the chest on 06/04/2019   PRIOR THERAPY: 1) status post stereotactic radiotherapy and surgical resection diagnosed in December 2016. 2) status post video bronchoscopy with left VATS and wedge resection of the left upper lobe and left lower lobe superior segmentectomy with lymph node dissection under the care of Dr. Servando Snare on 01/14/2015. 3) First-line treatment with immunotherapy with Ketruda (pembrolizumab) 200 mg IV every 3 weeks status post 35 cycles. First cycle was given 02/18/2015.  Discontinued after the patient completed 2 years of treatment. 4) stereotactic left frontal craniotomy for resection of  tumor and the final pathology was consistent with tumor necrosis.  This was done on 05/12/2017 5) Xarelto 15 mg p.o. twice daily for 3 weeks followed by 20 mg p.o. daily.  First dose started 06/04/2019 and discontinued on June 16, 2020     CURRENT THERAPY: Observation.  INTERVAL HISTORY: Timothy Obrien 61 y.o. male has a MyChart video virtual visit with me today for evaluation and discussion of his recent scan results.  The patient is feeling fine today with no concerning complaints.  He denied having any current chest pain, shortness of breath, cough or hemoptysis.  He has no nausea, vomiting, diarrhea or constipation.  He has no headache or visual changes.  He denied having any significant weight loss or night sweats.  He had repeat CT scan of the chest, abdomen and pelvis performed 2 days ago and we are having the virtual visit for discussion of his scan and recommendation regarding his condition.  MEDICAL HISTORY: Past Medical History:  Diagnosis Date   Anxiety    Bronchitis    Chronic fatigue 09/17/2015   Depression 02/11/2016   Hypertension 12/31/2015   states never been told   Lung cancer ([facility]) dx'd 2016   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   Pneumonia    Seizures ([place])    last 12/11/14   Shortness of breath dyspnea    with exertion   Tobacco abuse     ALLERGIES:  has No Known Allergies.  MEDICATIONS:  Current Outpatient Medications  Medication Sig Dispense Refill   acetaminophen (TYLENOL) 500 MG tablet Take 500 mg by mouth 2 (two) times daily as needed.     aspirin EC 81 MG tablet Take 81 mg by mouth daily. Swallow  whole.     ibuprofen (ADVIL,MOTRIN) 600 MG tablet Take 600 mg by mouth 2 (two) times daily as needed.     mirtazapine (REMERON) 30 MG tablet TAKE 1 TABLET BY MOUTH EVERYDAY AT BEDTIME 90 tablet 0   No current facility-administered medications for this visit.    SURGICAL HISTORY:  Past Surgical History:  Procedure Laterality Date   APPLICATION OF CRANIAL  NAVIGATION N/A 12/26/2014   Procedure: APPLICATION OF CRANIAL NAVIGATION;  Surgeon: Kevan Ny Ditty, MD;  Location: [facility];  Service: Neurosurgery;  Laterality: N/A;   APPLICATION OF CRANIAL NAVIGATION Left 05/12/2017   Procedure: APPLICATION OF CRANIAL NAVIGATION;  Surgeon: Consuella Lose, MD;  Location: [facility];  Service: Neurosurgery;  Laterality: Left;  APPLICATION OF CRANIAL NAVIGATION   CRANIOTOMY N/A 12/26/2014   Procedure: CRANIOTOMY TUMOR EXCISION with BrainLab;  Surgeon: Kevan Ny Ditty, MD;  Location: [facility];  Service: Neurosurgery;  Laterality: N/A;  CRANIOTOMY TUMOR EXCISION with Stealth   CRANIOTOMY Left 05/12/2017   Procedure: STEREOTACTIC LEFT FRONTOPERIETIAL CRANIOTOMY FOR RESECTION OF TUMOR;  Surgeon: Consuella Lose, MD;  Location: [facility];  Service: Neurosurgery;  Laterality: Left;  STEREOTACTIC LEFT FRONTOPERIETIAL CRANIOTOMY FOR RESECTION OF TUMOR   HERNIA REPAIR     VASECTOMY     VIDEO ASSISTED THORACOSCOPY (VATS)/WEDGE RESECTION Left 01/14/2015   Procedure: VIDEO ASSISTED THORACOSCOPY (VATS)/WEDGE RESECTION, Superior segmentectomy left  lower lobe, wedge resection of left upper lobe, multiple lymph node disection, On Q insertion.;  Surgeon: Grace Isaac, MD;  Location: [facility];  Service: Thoracic;  Laterality: Left;   VIDEO BRONCHOSCOPY Bilateral 12/16/2014   Procedure: VIDEO BRONCHOSCOPY WITH FLUORO;  Surgeon: Collene Gobble, MD;  Location: [facility];  Service: Cardiopulmonary;  Laterality: Bilateral;   VIDEO BRONCHOSCOPY N/A 01/14/2015   Procedure: VIDEO BRONCHOSCOPY;  Surgeon: Grace Isaac, MD;  Location: [facility];  Service: Thoracic;  Laterality: N/A;    REVIEW OF SYSTEMS:  A comprehensive review of systems was negative.     LABORATORY DATA: Lab Results  Component Value Date   WBC 6.7 06/25/2021   HGB 15.1 06/25/2021   HCT 43.0 06/25/2021   MCV 94.7 06/25/2021   PLT 279 06/25/2021      Chemistry      Component Value Date/Time   NA  137 06/25/2021 1007   NA 137 01/12/2017 0846   K 4.2 06/25/2021 1007   K 4.4 01/12/2017 0846   CL 109 06/25/2021 1007   CO2 20 (L) 06/25/2021 1007   CO2 18 (L) 01/12/2017 0846   BUN 15 06/25/2021 1007   BUN 15.3 01/12/2017 0846   CREATININE 1.40 (H) 12/28/2021 1348   CREATININE 0.93 06/25/2021 1007   CREATININE 1.0 01/12/2017 0846      Component Value Date/Time   CALCIUM 9.1 06/25/2021 1007   CALCIUM 9.0 01/12/2017 0846   ALKPHOS 71 06/25/2021 1007   ALKPHOS 99 01/12/2017 0846   AST 26 06/25/2021 1007   AST 23 01/12/2017 0846   ALT 17 06/25/2021 1007   ALT 22 01/12/2017 0846   BILITOT 0.3 06/25/2021 1007   BILITOT 0.41 01/12/2017 0846       RADIOGRAPHIC STUDIES: No results found.  ASSESSMENT AND PLAN:  This is a very pleasant 61 years old white male with metastatic non-small cell lung cancer, adenocarcinoma with positive PDL 1 expression of 100%.  This was diagnosed in December 2016 and presented with left lower lobe lung mass in addition to left hilar adenopathy and solitary brain metastasis.  He completed treatment with Ketruda (pembrolizumab) 200 mg IV every 3 weeks, status post 35 cycles, a total of 2 years. The patient is currently on observation and has been doing fine with no concerning complaints. He had repeat CT scan of the chest, abdomen and pelvis performed on 12/28/2021. The final report is still pending. I personally and independently reviewed the scan images and discussed the result with the patient today. On my review I did not see any concerning findings for disease recurrence or metastasis but I will wait for the final report for confirmation. I will see the patient back for follow-up visit in 6 months for evaluation with repeat CT scan of the chest, abdomen and pelvis. He was advised to call immediately if he has any other concerning symptoms in the interval. I discussed the assessment and treatment plan with the patient. The patient was provided an  opportunity to ask questions and all were answered. The patient agreed with the plan and demonstrated an understanding of the instructions.   The patient was advised to call back or seek an in-person evaluation if the symptoms worsen or if the condition fails to improve as anticipated.  I provided 15 minutes of face-to-face video visit time during this encounter, and > 50% was spent counseling as documented under my assessment & plan.  Eilleen Kempf, MD 12/30/2021 1:37 PM  Disclaimer: This note was dictated with voice recognition software. Similar sounding words can inadvertently be transcribed and may not be corrected upon review.

## 2022-02-02 DIAGNOSIS — D125 Benign neoplasm of sigmoid colon: Secondary | ICD-10-CM | POA: Diagnosis not present

## 2022-02-02 DIAGNOSIS — K648 Other hemorrhoids: Secondary | ICD-10-CM | POA: Diagnosis not present

## 2022-02-02 DIAGNOSIS — Z1211 Encounter for screening for malignant neoplasm of colon: Secondary | ICD-10-CM | POA: Diagnosis not present

## 2022-02-02 DIAGNOSIS — D123 Benign neoplasm of transverse colon: Secondary | ICD-10-CM | POA: Diagnosis not present

## 2022-04-04 ENCOUNTER — Other Ambulatory Visit: Payer: Self-pay | Admitting: Internal Medicine

## 2022-04-04 DIAGNOSIS — G4700 Insomnia, unspecified: Secondary | ICD-10-CM

## 2022-06-14 ENCOUNTER — Ambulatory Visit
Admission: RE | Admit: 2022-06-14 | Discharge: 2022-06-14 | Disposition: A | Discharge status: Home / Self Care | Payer: Medicare Other | Source: Ambulatory Visit | Attending: Internal Medicine | Admitting: Internal Medicine

## 2022-06-14 DIAGNOSIS — C7931 Secondary malignant neoplasm of brain: Secondary | ICD-10-CM | POA: Diagnosis not present

## 2022-06-14 DIAGNOSIS — C801 Malignant (primary) neoplasm, unspecified: Secondary | ICD-10-CM | POA: Diagnosis not present

## 2022-06-14 MED ORDER — GADOPICLENOL 0.5 MMOL/ML IV SOLN
7.0000 mL | Freq: Once | INTRAVENOUS | Status: AC | PRN
  Administered 2022-06-14: 7 mL via INTRAVENOUS

## 2022-06-14 MED ORDER — GADOPICLENOL 0.5 MMOL/ML IV SOLN: 7.0000 mL | Freq: Once | INTRAVENOUS | Status: DC | PRN

## 2022-06-15 ENCOUNTER — Telehealth: Payer: Self-pay | Admitting: *Deleted

## 2022-06-15 NOTE — Telephone Encounter (Signed)
Received vm message from pt. He is requesting to change his appt on 06/21/22 to an in person visit instead of a telephone visit.He wants to discuss the following with Dr. Barbaraann Cao: #1 the possibility of more PT to help improve his walking. #2 a different medication for depression. He currently takes Remeron but does not feel that this is effective #3 He will need another handicap parking form.  Scheduling message sent to change his appt to in person. And this message sent to Dr. Evonnie Dawes pod

## 2022-06-17 ENCOUNTER — Telehealth: Payer: Self-pay | Admitting: Internal Medicine

## 2022-06-17 NOTE — Telephone Encounter (Signed)
Scheduled per 06/06 scheduling message, patient has been called and voicemail was left.

## 2022-06-21 ENCOUNTER — Inpatient Hospital Stay: Payer: Medicare Other | Attending: Internal Medicine | Admitting: Internal Medicine

## 2022-06-21 ENCOUNTER — Telehealth: Payer: Self-pay | Admitting: *Deleted

## 2022-06-21 ENCOUNTER — Other Ambulatory Visit: Payer: Self-pay

## 2022-06-21 VITALS — BP 137/84 | HR 71 | Temp 97.5°F | Resp 18 | Wt 170.4 lb

## 2022-06-21 DIAGNOSIS — R531 Weakness: Secondary | ICD-10-CM

## 2022-06-21 DIAGNOSIS — Z87891 Personal history of nicotine dependence: Secondary | ICD-10-CM | POA: Diagnosis not present

## 2022-06-21 DIAGNOSIS — Z85118 Personal history of other malignant neoplasm of bronchus and lung: Secondary | ICD-10-CM | POA: Insufficient documentation

## 2022-06-21 DIAGNOSIS — Z79899 Other long term (current) drug therapy: Secondary | ICD-10-CM | POA: Insufficient documentation

## 2022-06-21 DIAGNOSIS — C7931 Secondary malignant neoplasm of brain: Secondary | ICD-10-CM | POA: Insufficient documentation

## 2022-06-21 MED ORDER — SERTRALINE HCL 25 MG PO TABS
25.0000 mg | ORAL_TABLET | Freq: Every day | ORAL | 2 refills | Status: DC
Start: 2022-06-21 — End: 2022-08-15

## 2022-06-21 NOTE — Telephone Encounter (Signed)
PC to [facility], no answer, left VM - informed office of Dr Liana Gerold referral for this patient, order is in, requested that Rehab office call us with any needs for additional information, [phone].

## 2022-06-21 NOTE — Progress Notes (Signed)
[facility] [address]  [place], [place] [address] [phone]   Interval Patient Evaluation  Date of Service: 06/21/22 Patient Name: Timothy Obrien Patient MRN: [id number] Patient DOB: 1960-04-14 Provider: Henreitta Leber, MD  Identifying Statement:  Timothy Obrien is a 62 y.o. male with Malignant neoplasm metastatic to brain [facility]) [C79.31]   Primary Cancer: NSCLC Stage IV  Oncologic History: 12/25/14: MRI demonstrates left frontal mestastasis, SRS is performed followed by resection. 12/23/16: Patient describes right leg weakness, some progression noted on MRI 05/12/17: Resection of left frontal progressive lesion.  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  Interval History:  Timothy Obrien presents today for follow up after recent MRI brain.  No new or progressive changes.  Continues to have some right leg weakness, stable from prior.  Denies seizures or headaches.  Mood issues have been more prominent, endorses depression symptoms.  Medications: Current Outpatient Medications on File Prior to Visit  Medication Sig Dispense Refill   cetirizine (ZYRTEC) 10 MG tablet Take 10 mg by mouth daily.     acetaminophen (TYLENOL) 500 MG tablet Take 500 mg by mouth 2 (two) times daily as needed.     aspirin EC 81 MG tablet Take 81 mg by mouth daily. Swallow whole.     ibuprofen (ADVIL,MOTRIN) 600 MG tablet Take 600 mg by mouth 2 (two) times daily as needed.     mirtazapine (REMERON) 30 MG tablet TAKE 1 TABLET BY MOUTH EVERYDAY AT BEDTIME 90 tablet 0   No current facility-administered medications on file prior to visit.    Allergies: No Known Allergies Past Medical History:  Past Medical History:  Diagnosis Date   Anxiety    Bronchitis    Chronic fatigue 09/17/2015   Depression 02/11/2016   Hypertension 12/31/2015   states never been told   Lung cancer ([facility]) dx'd 2016   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   Pneumonia    Seizures ([facility])    last  12/11/14   Shortness of breath dyspnea    with exertion   Tobacco abuse    Past Surgical History:  Past Surgical History:  Procedure Laterality Date   APPLICATION OF CRANIAL NAVIGATION N/A 12/26/2014   Procedure: APPLICATION OF CRANIAL NAVIGATION;  Surgeon: Loura Halt Ditty, MD;  [facility];  Service: Neurosurgery;  Laterality: N/A;   APPLICATION OF CRANIAL NAVIGATION Left 05/12/2017   Procedure: APPLICATION OF CRANIAL NAVIGATION;  Surgeon: Lisbeth Renshaw, MD;  [facility];  Service: Neurosurgery;  Laterality: Left;  APPLICATION OF CRANIAL NAVIGATION   CRANIOTOMY N/A 12/26/2014   Procedure: CRANIOTOMY TUMOR EXCISION with BrainLab;  Surgeon: Loura Halt Ditty, MD;  Location: [facility];  Service: Neurosurgery;  Laterality: N/A;  CRANIOTOMY TUMOR EXCISION with Stealth   CRANIOTOMY Left 05/12/2017   Procedure: STEREOTACTIC LEFT FRONTOPERIETIAL CRANIOTOMY FOR RESECTION OF TUMOR;  Surgeon: Lisbeth Renshaw, MD;  [facility];  Service: Neurosurgery;  Laterality: Left;  STEREOTACTIC LEFT FRONTOPERIETIAL CRANIOTOMY FOR RESECTION OF TUMOR   HERNIA REPAIR     VASECTOMY     VIDEO ASSISTED THORACOSCOPY (VATS)/WEDGE RESECTION Left 01/14/2015   Procedure: VIDEO ASSISTED THORACOSCOPY (VATS)/WEDGE RESECTION, Superior segmentectomy left  lower lobe, wedge resection of left upper lobe, multiple lymph node disection, On Q insertion.;  Surgeon: Delight Ovens, MD;  Location[facility];  Service: Thoracic;  Laterality: Left;   VIDEO BRONCHOSCOPY Bilateral 12/16/2014   Procedure: VIDEO BRONCHOSCOPY WITH FLUORO;  Surgeon: Les Pou  Delton Coombes, MD;  [facility];  Service: Cardiopulmonary;  Laterality: Bilateral;   VIDEO BRONCHOSCOPY N/A 01/14/2015   Procedure: VIDEO BRONCHOSCOPY;  Surgeon: Delight Ovens, MD;  Location: [facility] OR;  Service: Thoracic;  Laterality: N/A;   Social History:  Social History   Socioeconomic History   Marital status: Divorced    Spouse name: Not on file    Number of children: Not on file   Years of education: Not on file   Highest education level: Not on file  Occupational History   Not on file  Tobacco Use   Smoking status: Former    Packs/day: 1.00    Years: 36.00    Additional pack years: 0.00    Total pack years: 36.00    Types: Cigarettes    Start date: 12/14/1979    Quit date: 10/07/2015    Years since quitting: 6.7   Smokeless tobacco: Current   Tobacco comments:    Vapes: Few cigars a day. Quit cigarettes after easter.  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 He does have a cat but no other home pets. No mold exposure. Recent travel to [place] but with symptoms at the onset of travel.   Social Determinants of Health   Financial Resource Strain: Not on file  Food Insecurity: Not on file  Transportation Needs: Not on file  Physical Activity: Not on file  Stress: Not on file  Social Connections: Not on file  Intimate Partner Violence: Not on file   Family History:  Family History  Problem Relation Age of Onset   Breast cancer Mother    Colon polyps Mother    Arthritis Father     Review of Systems: Constitutional: Denies fevers, chills or abnormal weight loss Eyes: Denies blurriness of vision Ears, nose, mouth, throat, and face: Denies mucositis or sore throat Respiratory: Denies cough, dyspnea or wheezes Cardiovascular: Denies palpitation, chest discomfort or lower extremity swelling Gastrointestinal:  Denies nausea, constipation, diarrhea GU: Denies dysuria or incontinence Skin: Denies abnormal skin  rashes Neurological: Per HPI Musculoskeletal: Denies joint pain, back or neck discomfort. No decrease in ROM Behavioral/Psych: Denies anxiety, disturbance in thought content, and mood instability   Physical Exam: Vitals:   06/21/22 1002  BP: 137/84  Pulse: 71  Resp: 18  Temp: (!) 97.5 F (36.4 C)  SpO2: 98%   KPS: 80. General: Alert, cooperative, pleasant, in no acute distress Head: Normal EENT: No conjunctival injection or scleral icterus. Oral mucosa moist Lungs: Resp effort normal Cardiac: Regular rate and rhythm Abdomen: Soft, non-distended abdomen Skin: No rashes cyanosis or petechiae. Extremities: No clubbing or edema  Neurologic Exam: Mental Status: Awake, alert, attentive to examiner. Oriented to self and environment. Language is fluent with intact comprehension.  Cranial Nerves: Visual acuity is grossly normal. Visual fields are full. Extra-ocular movements intact. No ptosis. Face is symmetric, tongue midline. Motor: Tone and bulk are normal. Pronator drift in right, he is 4/5 in right leg. Reflexes are increased on  left with mild spasticity and 10 beats right ankle clonus.  Sensory: Intact to light touch and temperature Gait: Hemiparetic gait  Labs: I have reviewed the data as listed    Component Value Date/Time   NA 137 06/25/2021 1007   NA 137 01/12/2017 0846   K 4.2 06/25/2021 1007   K 4.4 01/12/2017 0846   CL 109 06/25/2021 1007   CO2 20 (L) 06/25/2021 1007   CO2 18 (L) 01/12/2017 0846   GLUCOSE 102 (H) 06/25/2021 1007   GLUCOSE 91 01/12/2017 0846   BUN 15 06/25/2021 1007   BUN 15.3 01/12/2017 0846   CREATININE 1.40 (H) 12/28/2021 1348   CREATININE 0.93 06/25/2021 1007   CREATININE 1.0 01/12/2017 0846   CALCIUM 9.1 06/25/2021 1007   CALCIUM 9.0 01/12/2017 0846   PROT 7.1 06/25/2021 1007   PROT 7.2 01/12/2017 0846   ALBUMIN 4.4 06/25/2021 1007   ALBUMIN 3.9 01/12/2017 0846   AST 26 06/25/2021 1007   AST 23 01/12/2017 0846   ALT 17 06/25/2021  1007   ALT 22 01/12/2017 0846   ALKPHOS 71 06/25/2021 1007   ALKPHOS 99 01/12/2017 0846   BILITOT 0.3 06/25/2021 1007   BILITOT 0.41 01/12/2017 0846   GFRNONAA >60 06/25/2021 1007   GFRAA >60 06/04/2019 1116   Lab Results  Component Value Date   WBC 6.7 06/25/2021   NEUTROABS 4.5 06/25/2021   HGB 15.1 06/25/2021   HCT 43.0 06/25/2021   MCV 94.7 06/25/2021   PLT 279 06/25/2021    Imaging:  No results found.   [facility] Clinician Interpretation: I have personally reviewed the radiological images as listed.  My interpretation, in the context of the patient's clinical presentation, is stable disease pending official read   Assessment/Plan 1. Brain metastasis ([facility])  2. Right leg weakness  Timothy Obrien is clinically and radiographically stable today.  MRI official read is pending.  Agreeable with trial of zoloft 25mg  daily for depression symptoms.  Will also refer to LCSW for counseling.  We ask that Daisy Floro return to clinic in 12 months following next brain MRI, or sooner as needed.  We will also touch base with him in ~2 months to address depression, SSRI titration.  We appreciate the opportunity to participate in the care of Timothy Obrien.    All questions were answered. The patient knows to call the clinic with any problems, questions or concerns. No barriers to learning were detected.  The total time spent in the encounter was  40 minutes  and more than 50% was on counseling and review of test results   Henreitta Leber, MD Medical Director of Neuro-Oncology [facility] at [facility] 06/21/22 10:16 AM

## 2022-06-22 ENCOUNTER — Telehealth: Payer: Self-pay | Admitting: Licensed Clinical Social Worker

## 2022-06-22 DIAGNOSIS — C7931 Secondary malignant neoplasm of brain: Secondary | ICD-10-CM

## 2022-06-28 ENCOUNTER — Other Ambulatory Visit: Payer: Self-pay

## 2022-06-28 ENCOUNTER — Ambulatory Visit: Payer: Medicare Other | Attending: Internal Medicine | Admitting: Physical Therapy

## 2022-06-28 ENCOUNTER — Encounter: Payer: Self-pay | Admitting: Physical Therapy

## 2022-06-28 DIAGNOSIS — M6281 Muscle weakness (generalized): Secondary | ICD-10-CM | POA: Diagnosis not present

## 2022-06-28 DIAGNOSIS — R2689 Other abnormalities of gait and mobility: Secondary | ICD-10-CM | POA: Insufficient documentation

## 2022-06-28 DIAGNOSIS — R2681 Unsteadiness on feet: Secondary | ICD-10-CM | POA: Diagnosis not present

## 2022-06-28 NOTE — Therapy (Signed)
OUTPATIENT PHYSICAL THERAPY NEURO EVALUATION   Patient Name: Timothy Obrien MRN: [id number] DOB:07-26-1960, 62 y.o., male Today's Date: 06/28/2022   PCP: [name], MD REFERRING PROVIDER: Henreitta Leber, MD   END OF SESSION:  PT End of Session - 06/28/22 0851     Visit Number 1    Number of Visits 13    Date for PT Re-Evaluation 08/30/22    Authorization Type Medicare/[employer] Supplemental    Progress Note Due on Visit 10    PT Start Time 0848    PT Stop Time 0934    PT Time Calculation (min) 46 min    Equipment Utilized During Treatment --   would benefit from gait belt due to fall risk   Activity Tolerance Patient tolerated treatment well    Behavior During Therapy [facility] for tasks assessed/performed             Past Medical History:  Diagnosis Date   Anxiety    Bronchitis    Chronic fatigue 09/17/2015   Depression 02/11/2016   Hypertension 12/31/2015   states never been told   Lung cancer (HCC) dx'd 2016   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   Pneumonia    Seizures (HCC)    last 12/11/14   Shortness of breath dyspnea    with exertion   Tobacco abuse    Past Surgical History:  Procedure Laterality Date   APPLICATION OF CRANIAL NAVIGATION N/A 12/26/2014   Procedure: APPLICATION OF CRANIAL NAVIGATION;  Surgeon: Loura Halt Ditty, MD;  Location: [facility] NEURO ORS;  Service: Neurosurgery;  Laterality: N/A;   APPLICATION OF CRANIAL NAVIGATION Left 05/12/2017   Procedure: APPLICATION OF CRANIAL NAVIGATION;  Surgeon: Lisbeth Renshaw, MD;  Location: [facility] OR;  Service: Neurosurgery;  Laterality: Left;  APPLICATION OF CRANIAL NAVIGATION   CRANIOTOMY N/A 12/26/2014   Procedure: CRANIOTOMY TUMOR EXCISION with BrainLab;  Surgeon: Loura Halt Ditty, MD;  Location: [facility] NEURO [facility];  Service: Neurosurgery;  Laterality: N/A;  CRANIOTOMY TUMOR EXCISION with Stealth   CRANIOTOMY Left 05/12/2017   Procedure: STEREOTACTIC LEFT FRONTOPERIETIAL CRANIOTOMY FOR RESECTION OF  TUMOR;  Surgeon: Lisbeth Renshaw, MD;  Location: [facility] OR;  Service: Neurosurgery;  Laterality: Left;  STEREOTACTIC LEFT FRONTOPERIETIAL CRANIOTOMY FOR RESECTION OF TUMOR   HERNIA REPAIR     VASECTOMY     VIDEO ASSISTED THORACOSCOPY (VATS)/WEDGE RESECTION Left 01/14/2015   Procedure: VIDEO ASSISTED THORACOSCOPY (VATS)/WEDGE RESECTION, Superior segmentectomy left  lower lobe, wedge resection of left upper lobe, multiple lymph node disection, On Q insertion.;  Surgeon: Delight Ovens, MD;  Location: [facility] OR;  Service: Thoracic;  Laterality: Left;   VIDEO BRONCHOSCOPY Bilateral 12/16/2014   Procedure: VIDEO BRONCHOSCOPY WITH FLUORO;  Surgeon: Leslye Peer, MD;  Location: [facility] ENDOSCOPY;  Service: Cardiopulmonary;  Laterality: Bilateral;   VIDEO BRONCHOSCOPY N/A 01/14/2015   Procedure: VIDEO BRONCHOSCOPY;  Surgeon: Delight Ovens, MD;  Location: [facility] OR;  Service: Thoracic;  Laterality: N/A;   Patient Active Problem List   Diagnosis Date Noted   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   Brain tumor (HCC) 05/12/2017   Right leg weakness 01/12/2017   Depression 02/11/2016   Hypertension 12/31/2015   Chronic fatigue 09/17/2015   Shoulder pain, left 08/06/2015   Encounter for antineoplastic immunotherapy 03/12/2015   Malnutrition of moderate degree 01/17/2015   S/P craniotomy 12/26/2014   Malignant neoplasm metastatic to brain [facility]) 12/26/2014   Primary cancer of left lower lobe of lung (HCC) 12/22/2014  Right sided weakness 12/12/2014   Tobacco abuse     ONSET DATE: 06/21/2022 (MD referral)  REFERRING DIAG:  C79.31 (ICD-10-CM) - Malignant neoplasm metastatic to brain (HCC)  R53.1 (ICD-10-CM) - Right sided weakness    THERAPY DIAG:  Other abnormalities of gait and mobility  Unsteadiness on feet  Muscle weakness (generalized)  Rationale for Evaluation and Treatment: Rehabilitation  SUBJECTIVE:                                                                                                                                                                                              SUBJECTIVE STATEMENT: Want to get assessment of my walking.  Just feel like I'm going with "brute force" and would like to have better confidence with gait.   Pt accompanied by: self  PERTINENT HISTORY: lung cancer with brain metastasis s/p crainiotomy 2016; radiation   PAIN:  Are you having pain? No  PRECAUTIONS: Fall  WEIGHT BEARING RESTRICTIONS: No  FALLS: Has patient fallen in last 6 months? No Hx of falls in the past; has had rib fractures LIVING ENVIRONMENT: Lives with: lives alone Lives in: House/apartment Stairs: Yes: Internal: 12 steps; does not go up the steps and External: 1 steps; none Has following equipment at home: Single point cane and walking poles Has foot-up brace, does not use  PLOF: Independent and Leisure: reports not doing as much due to decreased confidence  PATIENT GOALS: Improve R leg strength, balance, improved walking  OBJECTIVE:   DIAGNOSTIC FINDINGS: NA   COGNITION: Overall cognitive status: Within functional limits for tasks assessed   SENSATION: Light touch: WFL  MUSCLE TONE: RLE: Moderate and clonus   LOWER EXTREMITY ROM:     Active  Right Eval Left Eval  Hip flexion    Hip extension    Hip abduction    Hip adduction    Hip internal rotation    Hip external rotation    Knee flexion    Knee extension -8   Ankle dorsiflexion Neutral (passive +5)   Ankle plantarflexion    Ankle inversion    Ankle eversion     (Blank rows = not tested)  LOWER EXTREMITY MMT:    MMT Right Eval Left Eval  Hip flexion 3+ 4  Hip extension    Hip abduction    Hip adduction    Hip internal rotation    Hip external rotation    Knee flexion 3+ 4  Knee extension 4 4+  Ankle dorsiflexion 3-   Ankle plantarflexion 3-   Ankle inversion    Ankle eversion    (Blank rows = not  tested)   TRANSFERS: Assistive device utilized: None  Sit  to stand: Modified independence and increased weightbearing through LLE Stand to sit: Modified independence and increased weightbearing through LLE  STAIRS: Level of Assistance: CGA Stair Negotiation Technique: Step to Pattern with Single Rail on Left Number of Stairs: 2-3  Height of Stairs: 4-6"  Comments: reports using step to pattern at home; descends with LLE leading, decreased R foot clearance  GAIT: Gait pattern: step to pattern, step through pattern, decreased arm swing- Right, decreased step length- Right, decreased stance time- Right, decreased ankle dorsiflexion- Right, circumduction- Right, Right hip hike, decreased trunk rotation, and poor foot clearance- Right Distance walked: 50 ft Assistive device utilized: None Level of assistance: SBA and CGA   FUNCTIONAL TESTS:  5 times sit to stand: 22.53 sec Timed up and go (TUG): 19.94 sec 2 minute walk test: 200 ft 10 meter walk test: 19.34sec  (1.7 ft/sec)    TODAY'S TREATMENT:                                                                                                                              DATE: 06/28/2022    PATIENT EDUCATION: Education details: PT eval results, POC, initial HEP Person educated: Patient Education method: Explanation, Demonstration, and Handouts Education comprehension: verbalized understanding, returned demonstration, and needs further education  HOME EXERCISE PROGRAM: Access Code: [email or web address] URL: [email or web address] Date: 06/28/2022 Prepared by: [facility] Outpatient  Rehab - [facility]  Exercises - Seated Hamstring stretch  - 2 x daily - 7 x weekly - 1 sets - 3 reps - 30 sec hold - Standing Gastroc Stretch at Counter  - 2 x daily - 7 x weekly - 1 sets - 3 reps - 30 sec hold  GOALS: Goals reviewed with patient? Yes  SHORT TERM GOALS: Target date: 07/29/2022  Pt will be independent with HEP for improved strength, flexibility, balance, gait. Baseline:  Goal  status: INITIAL  2.  Pt will improve 5x sit<>stand to less than or equal to 18 sec to demonstrate improved functional strength and transfer efficiency. Baseline: 22.53 sec Goal status: INITIAL  LONG TERM GOALS: Target date: 08/12/2022  Pt will be independent with HEP for improved strength, flexibility, balance, gait. Baseline:  Goal status: INITIAL  2.  Pt will improve 5x sit<>stand to less than or equal to 12 sec to demonstrate improved functional strength and transfer efficiency. Baseline: 22.53 sec Goal status: INITIAL  3.  Pt will improve TUG score to less than or equal to 15 sec for decreased fall risk. Baseline: 19.94 sec Goal status: INITIAL  4.  Pt will improve gait velocity to at least 2 ft/sec for improved gait efficiency and safety. Baseline: 1.7 ft/sec Goal status: INITIAL  5.  Pt will improve 2 MWT to at least 250 ft for improved gait efficiency and endurance for gait. Baseline: 200 ft Goal status: INITIAL  ASSESSMENT:  CLINICAL IMPRESSION: Patient is a 62  y.o. male who was seen today for physical therapy evaluation and treatment for RLE weakness due to lung cancer metastasis to brain.  Pt presents to OPPT with hx of radiation and craniotomy for brain tumor in 2016, with residual RLE weakness.  He is wanting to be more active with walking and exercise in the community and presents to OPPT.  He presents with decreased flexibility, decreased strength, decreased balance, abnormal posture, abnormality of gait.  He is at increased fall risk per FTSTS, gait velocity and TUG tests.  He will benefit from skilled PT to address the above stated deficits to improve overall functional mobility and to decrease fall risk.  OBJECTIVE IMPAIRMENTS: Abnormal gait, decreased balance, decreased knowledge of use of DME, decreased mobility, difficulty walking, decreased ROM, decreased strength, impaired flexibility, impaired tone, and postural dysfunction.   ACTIVITY LIMITATIONS: carrying,  standing, squatting, stairs, transfers, and locomotion level  PARTICIPATION LIMITATIONS: shopping, community activity, and yard work  PERSONAL FACTORS: 1-2 comorbidities: see above  are also affecting patient's functional outcome.   REHAB POTENTIAL: Good  CLINICAL DECISION MAKING: Evolving/moderate complexity  EVALUATION COMPLEXITY: Moderate  PLAN:  PT FREQUENCY: 2x/week  PT DURATION: 6 weeks plus eval session, = 7 weeks total POC  PLANNED INTERVENTIONS: Therapeutic exercises, Therapeutic activity, Neuromuscular re-education, Balance training, Gait training, Patient/Family education, Self Care, Stair training, Orthotic/Fit training, DME instructions, and Manual therapy  PLAN FOR NEXT SESSION: Review initial HEP; continue to progress stretching (try hip flexor stretch, trunk rotation stretch in supine); work on RLE strengthening (try sit to stand stride stance and try single limb step ups).  Pt to bring in foot up brace-try this with gait for improved RLE step length/foot clearance   Tymeshia Awan W., PT 06/28/2022, 9:41 AM  [facility] at [facility] [address] [place], [address] [place], [place] [address] Phone # [phone] Fax # [phone]

## 2022-06-30 ENCOUNTER — Other Ambulatory Visit: Payer: Self-pay

## 2022-06-30 ENCOUNTER — Inpatient Hospital Stay: Payer: Medicare Other

## 2022-06-30 ENCOUNTER — Ambulatory Visit (HOSPITAL_COMMUNITY)
Admission: RE | Admit: 2022-06-30 | Discharge: 2022-06-30 | Disposition: A | Discharge status: Home / Self Care | Payer: Medicare Other | Source: Ambulatory Visit | Attending: Internal Medicine | Admitting: Internal Medicine

## 2022-06-30 DIAGNOSIS — C3490 Malignant neoplasm of unspecified part of unspecified bronchus or lung: Secondary | ICD-10-CM | POA: Insufficient documentation

## 2022-06-30 DIAGNOSIS — R911 Solitary pulmonary nodule: Secondary | ICD-10-CM | POA: Diagnosis not present

## 2022-06-30 DIAGNOSIS — K5730 Diverticulosis of large intestine without perforation or abscess without bleeding: Secondary | ICD-10-CM | POA: Diagnosis not present

## 2022-06-30 DIAGNOSIS — J432 Centrilobular emphysema: Secondary | ICD-10-CM | POA: Diagnosis not present

## 2022-06-30 LAB — CBC WITH DIFFERENTIAL (CANCER CENTER ONLY)
Abs Immature Granulocytes: 0.01 10*3/uL (ref 0.00–0.07)
Basophils Absolute: 0.1 10*3/uL (ref 0.0–0.1)
Basophils Relative: 1 %
Eosinophils Absolute: 0.1 10*3/uL (ref 0.0–0.5)
Eosinophils Relative: 1 %
HCT: 44.3 % (ref 39.0–52.0)
Hemoglobin: 15.3 g/dL (ref 13.0–17.0)
Immature Granulocytes: 0 %
Lymphocytes Relative: 22 %
Lymphs Abs: 1.4 10*3/uL (ref 0.7–4.0)
MCH: 32.6 pg (ref 26.0–34.0)
MCHC: 34.5 g/dL (ref 30.0–36.0)
MCV: 94.3 fL (ref 80.0–100.0)
Monocytes Absolute: 0.7 10*3/uL (ref 0.1–1.0)
Monocytes Relative: 11 %
Neutro Abs: 4.1 10*3/uL (ref 1.7–7.7)
Neutrophils Relative %: 65 %
Platelet Count: 300 10*3/uL (ref 150–400)
RBC: 4.7 MIL/uL (ref 4.22–5.81)
RDW: 12.8 % (ref 11.5–15.5)
WBC Count: 6.3 10*3/uL (ref 4.0–10.5)
nRBC: 0 % (ref 0.0–0.2)

## 2022-06-30 LAB — CMP (CANCER CENTER ONLY)
ALT: 28 U/L (ref 0–44)
AST: 39 U/L (ref 15–41)
Albumin: 4.3 g/dL (ref 3.5–5.0)
Alkaline Phosphatase: 68 U/L (ref 38–126)
Anion gap: 7 (ref 5–15)
BUN: 8 mg/dL (ref 6–20)
CO2: 24 mmol/L (ref 22–32)
Calcium: 9 mg/dL (ref 8.9–10.3)
Chloride: 99 mmol/L (ref 98–111)
Creatinine: 0.98 mg/dL (ref 0.61–1.24)
GFR, Estimated: >60 mL/min (ref >60)
Glucose, Bld: 121 mg/dL — ABNORMAL HIGH (ref 70–99)
Potassium: 4.3 mmol/L (ref 3.5–5.1)
Sodium: 130 mmol/L — ABNORMAL LOW (ref 135–145)
Total Bilirubin: 0.5 mg/dL (ref 0.3–1.2)
Total Protein: 6.7 g/dL (ref 6.5–8.1)

## 2022-06-30 MED ORDER — DIPHENHYDRAMINE HCL 25 MG PO CAPS: 25.0000 mg | ORAL_CAPSULE | Freq: Once | ORAL | Status: AC

## 2022-06-30 MED ORDER — IOHEXOL 300 MG/ML  SOLN
100.0000 mL | Freq: Once | INTRAMUSCULAR | Status: AC | PRN
  Administered 2022-06-30: 100 mL via INTRAVENOUS

## 2022-06-30 MED ORDER — DIPHENHYDRAMINE HCL 50 MG/ML IJ SOLN
25.0000 mg | Freq: Once | INTRAMUSCULAR | Status: AC
  Administered 2022-06-30: 25 mg via INTRAVENOUS

## 2022-06-30 MED ORDER — DIPHENHYDRAMINE HCL 50 MG/ML IJ SOLN
INTRAMUSCULAR | Status: AC
  Filled 2022-06-30: qty 1

## 2022-06-30 MED ORDER — FAMOTIDINE 20 MG PO TABS
20.0000 mg | ORAL_TABLET | Freq: Once | ORAL | Status: AC
Start: 2022-06-30 — End: 2022-06-30
  Administered 2022-06-30: 20 mg via ORAL
  Filled 2022-06-30: qty 1

## 2022-06-30 NOTE — Progress Notes (Addendum)
Patient ID: Timothy Obrien, male   DOB: 12-06-1960, 62 y.o.   MRN: [id number] Pt presented to CT dept today for outpatient CT C/A/P with IV contrast; following procedure pt  began to feel flushed with facial erythema along with pinpoint macular rash noted on abd/legs/arms; no dyspnea/wheezing, significant itching or edema; O2 sats were 99-100% RA. Pt given 25 mg IV benadryl along with pepcid 20 mg po and began to feel better. Pt was anxious but denied fever, HA,CP, N/V. He was seen by Dr. Pecolia Ades (diagnostic radiologist) and observed for 30-45 minutes in CT suite before being dc'd home in stable condition. Will notify Dr. Mohamed(ordering MD) of pt's status and mark IV contrast dye as allergy for pt in chart. Pt will need to be premedicated for future CT's in which IV contrast will be needed.

## 2022-07-01 ENCOUNTER — Telehealth: Payer: Self-pay | Admitting: Student

## 2022-07-01 NOTE — Telephone Encounter (Signed)
Attempted to call patient today in follow-up to allergic reaction to IV contrast on 06/30/22. Patient did not answer phone. Kennieth Francois, PA-C 07/01/2022 4:17 PM

## 2022-07-04 ENCOUNTER — Other Ambulatory Visit: Payer: Self-pay | Admitting: Internal Medicine

## 2022-07-04 DIAGNOSIS — G4700 Insomnia, unspecified: Secondary | ICD-10-CM

## 2022-07-05 ENCOUNTER — Encounter: Payer: Self-pay | Admitting: Physical Therapy

## 2022-07-05 ENCOUNTER — Ambulatory Visit: Payer: Medicare Other | Admitting: Physical Therapy

## 2022-07-05 DIAGNOSIS — M6281 Muscle weakness (generalized): Secondary | ICD-10-CM

## 2022-07-05 DIAGNOSIS — R2681 Unsteadiness on feet: Secondary | ICD-10-CM

## 2022-07-05 DIAGNOSIS — R2689 Other abnormalities of gait and mobility: Secondary | ICD-10-CM | POA: Diagnosis not present

## 2022-07-05 NOTE — Therapy (Signed)
OUTPATIENT PHYSICAL THERAPY NEURO TREATMENT NOTE   Patient Name: Timothy Obrien MRN: [id number] DOB:08/05/1960, 62 y.o., male Today's Date: 07/05/2022   PCP: [name], MD REFERRING PROVIDER: Henreitta Leber, MD   END OF SESSION:  PT End of Session - 07/05/22 0846     Visit Number 2    Number of Visits 13    Date for PT Re-Evaluation 08/30/22    Authorization Type Medicare/[employer] Supplemental    Progress Note Due on Visit 10    PT Start Time [phone]    PT Stop Time 0932    PT Time Calculation (min) 45 min    Equipment Utilized During Treatment Gait belt   would benefit from gait belt due to fall risk   Activity Tolerance Patient tolerated treatment well    Behavior During Therapy [facility] for tasks assessed/performed              Past Medical History:  Diagnosis Date   Anxiety    Bronchitis    Chronic fatigue 09/17/2015   Depression 02/11/2016   Hypertension 12/31/2015   states never been told   Lung cancer (HCC) dx'd 2016   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   Pneumonia    Seizures (HCC)    last 12/11/14   Shortness of breath dyspnea    with exertion   Tobacco abuse    Past Surgical History:  Procedure Laterality Date   APPLICATION OF CRANIAL NAVIGATION N/A 12/26/2014   Procedure: APPLICATION OF CRANIAL NAVIGATION;  Surgeon: Loura Halt Ditty, MD;  Location: [facility];  Service: Neurosurgery;  Laterality: N/A;   APPLICATION OF CRANIAL NAVIGATION Left 05/12/2017   Procedure: APPLICATION OF CRANIAL NAVIGATION;  Surgeon: Lisbeth Renshaw, MD;  Location: [facility] OR;  Service: Neurosurgery;  Laterality: Left;  APPLICATION OF CRANIAL NAVIGATION   CRANIOTOMY N/A 12/26/2014   Procedure: CRANIOTOMY TUMOR EXCISION with BrainLab;  Surgeon: Loura Halt Ditty, MD;  Location: [facility];  Service: Neurosurgery;  Laterality: N/A;  CRANIOTOMY TUMOR EXCISION with Stealth   CRANIOTOMY Left 05/12/2017   Procedure: STEREOTACTIC LEFT FRONTOPERIETIAL CRANIOTOMY FOR  RESECTION OF TUMOR;  Surgeon: Lisbeth Renshaw, MD;  Location: [facility] OR;  Service: Neurosurgery;  Laterality: Left;  STEREOTACTIC LEFT FRONTOPERIETIAL CRANIOTOMY FOR RESECTION OF TUMOR   HERNIA REPAIR     VASECTOMY     VIDEO ASSISTED THORACOSCOPY (VATS)/WEDGE RESECTION Left 01/14/2015   Procedure: VIDEO ASSISTED THORACOSCOPY (VATS)/WEDGE RESECTION, Superior segmentectomy left  lower lobe, wedge resection of left upper lobe, multiple lymph node disection, On Q insertion.;  Surgeon: Delight Ovens, MD;  Location: [facility] OR;  Service: Thoracic;  Laterality: Left;   VIDEO BRONCHOSCOPY Bilateral 12/16/2014   Procedure: VIDEO BRONCHOSCOPY WITH FLUORO;  Surgeon: Leslye Peer, MD;  Location: [facility] END[facility]COPY;  Service: Cardiopulmonary;  Laterality: Bilateral;   VIDEO BRONCHOSCOPY N/A 01/14/2015   Procedure: VIDEO BRONCHOSCOPY;  Surgeon: Delight Ovens, MD;  Location: [facility] OR;  Service: Thoracic;  Laterality: N/A;   Patient Active Problem List   Diagnosis Date Noted   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   Brain tumor (HCC) 05/12/2017   Right leg weakness 01/12/2017   Depression 02/11/2016   Hypertension 12/31/2015   Chronic fatigue 09/17/2015   Shoulder pain, left 08/06/2015   Encounter for antineoplastic immunotherapy 03/12/2015   Malnutrition of moderate degree 01/17/2015   S/P craniotomy 12/26/2014   Malignant neoplasm metastatic to brain (HCC) 12/26/2014   Primary cancer of left lower lobe of lung (  HCC) 12/22/2014   Right sided weakness 12/12/2014   Tobacco abuse     ONSET DATE: 06/21/2022 (MD referral)  REFERRING DIAG:  C79.31 (ICD-10-CM) - Malignant neoplasm metastatic to brain (HCC)  R53.1 (ICD-10-CM) - Right sided weakness    THERAPY DIAG:  Other abnormalities of gait and mobility  Unsteadiness on feet  Muscle weakness (generalized)  Rationale for Evaluation and Treatment: Rehabilitation  SUBJECTIVE:                                                                                                                                                                                              SUBJECTIVE STATEMENT: Had allergic reaction to contrast dye with CT last week.  Feel better now.  Brought in the foot-up brace.  Haven't been using it. Pt accompanied by: self  PERTINENT HISTORY: lung cancer with brain metastasis s/p crainiotomy 2016; radiation   PAIN:  Are you having pain? No  PRECAUTIONS: Fall  WEIGHT BEARING RESTRICTIONS: No  FALLS: Has patient fallen in last 6 months? No Hx of falls in the past; has had rib fractures LIVING ENVIRONMENT: Lives with: lives alone Lives in: House/apartment Stairs: Yes: Internal: 12 steps; does not go up the steps and External: 1 steps; none Has following equipment at home: Single point cane and walking poles Has foot-up brace, does not use  PLOF: Independent and Leisure: reports not doing as much due to decreased confidence  PATIENT GOALS: Improve R leg strength, balance, improved walking  OBJECTIVE:   TODAY'S TREATMENT: 07/05/2022 Activity Comments  Gait with use of foot up brace, gait without foot up brace, 50 ft x 2 reps each Slightly better R foot clearance with foot up brace; pt has definite R knee flexion at stance   Seated hamstring stretch, 3 x 30 sec Review of HEP, cues for optimal stretch  Supine ex: Bridging 2 x 5, 3" Trunk rotation 3 reps, 15 sec DKTC, 3 x 15 sec Hamstring stretch with strap, 2 reps Hooklying march, 2 x 5 reps Supine hip/knee flexion off edge of mat, 2 x 5 reps   NuStep, Level 3, BLEs only x 5 minutes at >40 SPM          Access Code: [email or web address] URL: [email or web address] Date: 07/05/2022 Prepared by: [facility] - Outpatient  Rehab - [facility]  Exercises - Seated Hamstring stretch  - 2 x daily - 7 x weekly - 1 sets - 3 reps - 30 sec hold - Standing Gastroc Stretch at Counter  - 2 x daily - 7 x weekly - 1 sets - 3 reps - 30 sec hold - Supine  Hamstring  Stretch with Strap  - 1 x daily - 7 x weekly - 1 sets - 3 reps - 30 sec hold - Hip Flexion  - 1 x daily - 5 x weekly - 2 sets - 10 reps  PATIENT EDUCATION: Education details: HEP additions, use of foot up brace and how to don/doff correctly; readjusted placement in shoe and discussed using for most of the day while walking, even at home Person educated: Patient Education method: Explanation, Demonstration, Verbal cues, and Handouts Education comprehension: verbalized understanding, returned demonstration, and needs further education  -------------------------------------------------- Objective measures below taken at initial evaluation: DIAGNOSTIC FINDINGS: NA   COGNITION: Overall cognitive status: Within functional limits for tasks assessed   SENSATION: Light touch: WFL  MUSCLE TONE: RLE: Moderate and clonus   LOWER EXTREMITY ROM:     Active  Right Eval Left Eval  Hip flexion    Hip extension    Hip abduction    Hip adduction    Hip internal rotation    Hip external rotation    Knee flexion    Knee extension -8   Ankle dorsiflexion Neutral (passive +5)   Ankle plantarflexion    Ankle inversion    Ankle eversion     (Blank rows = not tested)  LOWER EXTREMITY MMT:    MMT Right Eval Left Eval  Hip flexion 3+ 4  Hip extension    Hip abduction    Hip adduction    Hip internal rotation    Hip external rotation    Knee flexion 3+ 4  Knee extension 4 4+  Ankle dorsiflexion 3-   Ankle plantarflexion 3-   Ankle inversion    Ankle eversion    (Blank rows = not tested)   TRANSFERS: Assistive device utilized: None  Sit to stand: Modified independence and increased weightbearing through LLE Stand to sit: Modified independence and increased weightbearing through LLE  STAIRS: Level of Assistance: CGA Stair Negotiation Technique: Step to Pattern with Single Rail on Left Number of Stairs: 2-3  Height of Stairs: 4-6"  Comments: reports using step to pattern at home;  descends with LLE leading, decreased R foot clearance  GAIT: Gait pattern: step to pattern, step through pattern, decreased arm swing- Right, decreased step length- Right, decreased stance time- Right, decreased ankle dorsiflexion- Right, circumduction- Right, Right hip hike, decreased trunk rotation, and poor foot clearance- Right Distance walked: 50 ft Assistive device utilized: None Level of assistance: SBA and CGA   FUNCTIONAL TESTS:  5 times sit to stand: 22.53 sec Timed up and go (TUG): 19.94 sec 2 minute walk test: 200 ft 10 meter walk test: 19.34sec  (1.7 ft/sec)    TODAY'S TREATMENT:                                                                                                                              DATE: 06/28/2022    PATIENT EDUCATION: Education details: PT eval results,  POC, initial HEP Person educated: Patient Education method: Explanation, Demonstration, and Handouts Education comprehension: verbalized understanding, returned demonstration, and needs further education  HOME EXERCISE PROGRAM: Access Code: [email or web address] URL: [email or web address] Date: 06/28/2022 Prepared by: [facility] - Outpatient  Rehab - [facility]  Exercises - Seated Hamstring stretch  - 2 x daily - 7 x weekly - 1 sets - 3 reps - 30 sec hold - Standing Gastroc Stretch at Counter  - 2 x daily - 7 x weekly - 1 sets - 3 reps - 30 sec hold  GOALS: Goals reviewed with patient? Yes  SHORT TERM GOALS: Target date: 07/29/2022  Pt will be independent with HEP for improved strength, flexibility, balance, gait. Baseline:  Goal status: IN PROGRESS  2.  Pt will improve 5x sit<>stand to less than or equal to 18 sec to demonstrate improved functional strength and transfer efficiency. Baseline: 22.53 sec Goal status: IN PROGRESS  LONG TERM GOALS: Target date: 08/12/2022  Pt will be independent with HEP for improved strength, flexibility, balance, gait. Baseline:  Goal status: IN  PROGRESS  2.  Pt will improve 5x sit<>stand to less than or equal to 12 sec to demonstrate improved functional strength and transfer efficiency. Baseline: 22.53 sec Goal status: IN PROGRESS  3.  Pt will improve TUG score to less than or equal to 15 sec for decreased fall risk. Baseline: 19.94 sec Goal status: IN PROGRESS  4.  Pt will improve gait velocity to at least 2 ft/sec for improved gait efficiency and safety. Baseline: 1.7 ft/sec Goal status: IN PROGRESS  5.  Pt will improve 2 MWT to at least 250 ft for improved gait efficiency and endurance for gait. Baseline: 200 ft Goal status: IN PROGRESS  ASSESSMENT:  CLINICAL IMPRESSION: Pt returns today for skilled PT session, and he brings in his foot up brace.  Repositioned it correctly in his shoe and educated on how to don foot-up brace for optimal use.  With use of foot up brace, he demonstrates slightly better foot clearance and ability to initiate swing phase with hip/knee flexion versus circumduction.  He does continue to have tightness in hamstrings on RLE and he demo associated flexor synergy pattern with RUE as well.  Worked to add further stretches and hip/knee flexor strengthening to HEP.  He will continue to benefit from skilled PT towards goals for improved functional mobility and decreased fall risk.  OBJECTIVE IMPAIRMENTS: Abnormal gait, decreased balance, decreased knowledge of use of DME, decreased mobility, difficulty walking, decreased ROM, decreased strength, impaired flexibility, impaired tone, and postural dysfunction.   ACTIVITY LIMITATIONS: carrying, standing, squatting, stairs, transfers, and locomotion level  PARTICIPATION LIMITATIONS: shopping, community activity, and yard work  PERSONAL FACTORS: 1-2 comorbidities: see above  are also affecting patient's functional outcome.   REHAB POTENTIAL: Good  CLINICAL DECISION MAKING: Evolving/moderate complexity  EVALUATION COMPLEXITY: Moderate  PLAN:  PT  FREQUENCY: 2x/week  PT DURATION: 6 weeks plus eval session, = 7 weeks total POC  PLANNED INTERVENTIONS: Therapeutic exercises, Therapeutic activity, Neuromuscular re-education, Balance training, Gait training, Patient/Family education, Self Care, Stair training, Orthotic/Fit training, DME instructions, and Manual therapy  PLAN FOR NEXT SESSION: Review updates toHEP; continue to progress stretching (try hip flexor stretch, hamstrings, gastrocs); work on RLE strengthening (try sit to stand stride stance and try single limb step ups).  Try gait with cane, walking pole for optimal gait mechanics/pattern   Gean Maidens., PT 07/05/2022, 9:58 AM  [facility] [address] [place], [place] [address] Phone # [phone] Fax # [phone]

## 2022-07-07 ENCOUNTER — Ambulatory Visit: Payer: Medicare Other | Admitting: Physical Therapy

## 2022-07-07 DIAGNOSIS — M6281 Muscle weakness (generalized): Secondary | ICD-10-CM

## 2022-07-07 DIAGNOSIS — R2689 Other abnormalities of gait and mobility: Secondary | ICD-10-CM | POA: Diagnosis not present

## 2022-07-07 DIAGNOSIS — R2681 Unsteadiness on feet: Secondary | ICD-10-CM | POA: Diagnosis not present

## 2022-07-07 NOTE — Therapy (Signed)
OUTPATIENT PHYSICAL THERAPY NEURO TREATMENT NOTE   Patient Name: Timothy Obrien MRN: [id number] DOB:1960-12-05, 62 y.o., male Today's Date: 07/07/2022   PCP: [name], MD REFERRING PROVIDER: Henreitta Leber, MD   END OF SESSION:  PT End of Session - 07/07/22 0851     Visit Number 3    Number of Visits 13    Date for PT Re-Evaluation 08/30/22    Authorization Type Medicare/[employer] Supplemental    Progress Note Due on Visit 10    PT Start Time 0850    PT Stop Time 0931    PT Time Calculation (min) 41 min    Equipment Utilized During Treatment Gait belt   would benefit from gait belt due to fall risk   Activity Tolerance Patient tolerated treatment well    Behavior During Therapy [facility] for tasks assessed/performed              Past Medical History:  Diagnosis Date   Anxiety    Bronchitis    Chronic fatigue 09/17/2015   Depression 02/11/2016   Hypertension 12/31/2015   states never been told   Lung cancer (HCC) dx'd 2016   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   Pneumonia    Seizures (HCC)    last 12/11/14   Shortness of breath dyspnea    with exertion   Tobacco abuse    Past Surgical History:  Procedure Laterality Date   APPLICATION OF CRANIAL NAVIGATION N/A 12/26/2014   Procedure: APPLICATION OF CRANIAL NAVIGATION;  Surgeon: Loura Halt Ditty, MD;  Location: [facility] NEURO ORS;  Service: Neurosurgery;  Laterality: N/A;   APPLICATION OF CRANIAL NAVIGATION Left 05/12/2017   Procedure: APPLICATION OF CRANIAL NAVIGATION;  Surgeon: Lisbeth Renshaw, MD;  Location: [facility] OR;  Service: Neurosurgery;  Laterality: Left;  APPLICATION OF CRANIAL NAVIGATION   CRANIOTOMY N/A 12/26/2014   Procedure: CRANIOTOMY TUMOR EXCISION with BrainLab;  Surgeon: Loura Halt Ditty, MD;  Location: [facility] NEURO OR[facility];  Service: Neurosurgery;  Laterality: N/A;  CRANIOTOMY TUMOR EXCISION with Stealth   CRANIOTOMY Left 05/12/2017   Procedure: STEREOTACTIC LEFT FRONTOPERIETIAL CRANIOTOMY FOR  RESECTION OF TUMOR;  Surgeon: Lisbeth Renshaw, MD;  Location: [facility] OR;  Service: Neurosurgery;  Laterality: Left;  STEREOTACTIC LEFT FRONTOPERIETIAL CRANIOTOMY FOR RESECTION OF TUMOR   HERNIA REPAIR     VASECTOMY     VIDEO ASSISTED THORACOSCOPY (VATS)/WEDGE RESECTION Left 01/14/2015   Procedure: VIDEO ASSISTED THORACOSCOPY (VATS)/WEDGE RESECTION, Superior segmentectomy left  lower lobe, wedge resection of left upper lobe, multiple lymph node disection, On Q insertion.;  Surgeon: Delight Ovens, MD;  Location: [facility] OR;  Service: Thoracic;  Laterality: Left;   VIDEO BRONCHOSCOPY Bilateral 12/16/2014   Procedure: VIDEO BRONCHOSCOPY WITH FLUORO;  Surgeon: Leslye Peer, MD;  Location: [facility] ENDOSCOPY;  Service: Cardiopulmonary;  Laterality: Bilateral;   VIDEO BRONCHOSCOPY N/A 01/14/2015   Procedure: VIDEO BRONCHOSCOPY;  Surgeon: Delight Ovens, MD;  Location: [facility] OR;  Service: Thoracic;  Laterality: N/A;   Patient Active Problem List   Diagnosis Date Noted   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   Brain tumor (HCC) 05/12/2017   Right leg weakness 01/12/2017   Depression 02/11/2016   Hypertension 12/31/2015   Chronic fatigue 09/17/2015   Shoulder pain, left 08/06/2015   Encounter for antineoplastic immunotherapy 03/12/2015   Malnutrition of moderate degree 01/17/2015   S/P craniotomy 12/26/2014   Malignant neoplasm metastatic to brain (HCC) 12/26/2014   Primary cancer of left lower lobe of lung (  HCC) 12/22/2014   Right sided weakness 12/12/2014   Tobacco abuse     ONSET DATE: 06/21/2022 (MD referral)  REFERRING DIAG:  C79.31 (ICD-10-CM) - Malignant neoplasm metastatic to brain (HCC)  R53.1 (ICD-10-CM) - Right sided weakness    THERAPY DIAG:  Other abnormalities of gait and mobility  Unsteadiness on feet  Muscle weakness (generalized)  Rationale for Evaluation and Treatment: Rehabilitation  SUBJECTIVE:                                                                                                                                                                                              SUBJECTIVE STATEMENT: Wore the brace all day yesterday.  Went outside yesterday afternoon and picked up sticks, walked in yard.  Noticed when I put the brace on today there was a red irritated spot.  Did I wear it too much or too tight? Pt accompanied by: self  PERTINENT HISTORY: lung cancer with brain metastasis s/p crainiotomy 2016; radiation   PAIN:  Are you having pain? Yes: NPRS scale: 5/10 Pain location: R heel cord Pain description: irritated Aggravating factors: wearing brace Relieving factors: taking it off  PRECAUTIONS: Fall  WEIGHT BEARING RESTRICTIONS: No  FALLS: Has patient fallen in last 6 months? No Hx of falls in the past; has had rib fractures LIVING ENVIRONMENT: Lives with: lives alone Lives in: House/apartment Stairs: Yes: Internal: 12 steps; does not go up the steps and External: 1 steps; none Has following equipment at home: Single point cane and walking poles Has foot-up brace, does not use  PLOF: Independent and Leisure: reports not doing as much due to decreased confidence  PATIENT GOALS: Improve R leg strength, balance, improved walking  OBJECTIVE:   Assessed skin at R heelcord and there is small reddened area.  Instructed pt to not wear foot-up brace today, but start again tomorrow as long as redness has gone, wearing 2 hours.  Discussed gradually increasing wear time.  Assessed after about 30 minutes into session, and area is less red and less irritated.  No broken skin noted.  TODAY'S TREATMENT: 07/07/2022 Activity Comments  Supine ex: -DKTC, 3 x 30" -Hamstring stretch with strap, 3 x 30 sec -Bridging 10 reps, 2 sets (added ball squeeze for 2nd set) -Supine hip/knee flexion off edge of mat, 2 x 5 reps   Long sit R hamstring stretch, 3 x 30 sec   Sit<>stand 2 x 5 reps, intermittent UE support Cues for equal weightbearing and R  quad activation  RLE as stance with LLE forward>back step, at counter for support Tactile cues for full R knee extension  Gastroc stretch at 4" step, 3 reps   RLE forward step ups, 10 reps Cues for full R knee extension   Access Code: [email or web address] URL: [email or web address] Date: 07/07/2022 Prepared by: [facility] - Outpatient  Rehab - [facility]  Exercises - Seated Hamstring stretch  - 2 x daily - 7 x weekly - 1 sets - 3 reps - 30 sec hold - Standing Gastroc Stretch at Counter  - 2 x daily - 7 x weekly - 1 sets - 3 reps - 30 sec hold - Hip Flexion  - 1 x daily - 5 x weekly - 2 sets - 10 reps - Seated Table Hamstring Stretch  - 1-2 x daily - 7 x weekly - 1 sets - 3 reps - 30 sec hold - Sit to Stand  - 1-2 x daily - 7 x weekly - 2 sets - 5 reps - 3 sec hold  PATIENT EDUCATION: Education details: Discussed backing off wear time and skin inspection , updates to HEP Person educated: Patient Education method: Programmer, multimedia, Demonstration, Verbal cues, and Handouts Education comprehension: verbalized understanding, returned demonstration, and needs further education  -------------------------------------------------- Objective measures below taken at initial evaluation: DIAGNOSTIC FINDINGS: NA   COGNITION: Overall cognitive status: Within functional limits for tasks assessed   SENSATION: Light touch: WFL  MUSCLE TONE: RLE: Moderate and clonus   LOWER EXTREMITY ROM:     Active  Right Eval Left Eval  Hip flexion    Hip extension    Hip abduction    Hip adduction    Hip internal rotation    Hip external rotation    Knee flexion    Knee extension -8   Ankle dorsiflexion Neutral (passive +5)   Ankle plantarflexion    Ankle inversion    Ankle eversion     (Blank rows = not tested)  LOWER EXTREMITY MMT:    MMT Right Eval Left Eval  Hip flexion 3+ 4  Hip extension    Hip abduction    Hip adduction    Hip internal rotation    Hip external rotation    Knee  flexion 3+ 4  Knee extension 4 4+  Ankle dorsiflexion 3-   Ankle plantarflexion 3-   Ankle inversion    Ankle eversion    (Blank rows = not tested)   TRANSFERS: Assistive device utilized: None  Sit to stand: Modified independence and increased weightbearing through LLE Stand to sit: Modified independence and increased weightbearing through LLE  STAIRS: Level of Assistance: CGA Stair Negotiation Technique: Step to Pattern with Single Rail on Left Number of Stairs: 2-3  Height of Stairs: 4-6"  Comments: reports using step to pattern at home; descends with LLE leading, decreased R foot clearance  GAIT: Gait pattern: step to pattern, step through pattern, decreased arm swing- Right, decreased step length- Right, decreased stance time- Right, decreased ankle dorsiflexion- Right, circumduction- Right, Right hip hike, decreased trunk rotation, and poor foot clearance- Right Distance walked: 50 ft Assistive device utilized: None Level of assistance: SBA and CGA   FUNCTIONAL TESTS:  5 times sit to stand: 22.53 sec Timed up and go (TUG): 19.94 sec 2 minute walk test: 200 ft 10 meter walk test: 19.34sec  (1.7 ft/sec)    TODAY'S TREATMENT:  DATE: 06/28/2022    PATIENT EDUCATION: Education details: PT eval results, POC, initial HEP Person educated: Patient Education method: Explanation, Demonstration, and Handouts Education comprehension: verbalized understanding, returned demonstration, and needs further education  HOME EXERCISE PROGRAM: Access Code: [email or web address] URL: [email or web address] Date: 06/28/2022 Prepared by: [facility] - Outpatient  Rehab - [facility]  Exercises - Seated Hamstring stretch  - 2 x daily - 7 x weekly - 1 sets - 3 reps - 30 sec hold - Standing Gastroc Stretch at Counter  - 2 x daily - 7 x weekly - 1 sets - 3 reps -  30 sec hold  GOALS: Goals reviewed with patient? Yes  SHORT TERM GOALS: Target date: 07/29/2022  Pt will be independent with HEP for improved strength, flexibility, balance, gait. Baseline:  Goal status: IN PROGRESS  2.  Pt will improve 5x sit<>stand to less than or equal to 18 sec to demonstrate improved functional strength and transfer efficiency. Baseline: 22.53 sec Goal status: IN PROGRESS  LONG TERM GOALS: Target date: 08/12/2022  Pt will be independent with HEP for improved strength, flexibility, balance, gait. Baseline:  Goal status: IN PROGRESS  2.  Pt will improve 5x sit<>stand to less than or equal to 12 sec to demonstrate improved functional strength and transfer efficiency. Baseline: 22.53 sec Goal status: IN PROGRESS  3.  Pt will improve TUG score to less than or equal to 15 sec for decreased fall risk. Baseline: 19.94 sec Goal status: IN PROGRESS  4.  Pt will improve gait velocity to at least 2 ft/sec for improved gait efficiency and safety. Baseline: 1.7 ft/sec Goal status: IN PROGRESS  5.  Pt will improve 2 MWT to at least 250 ft for improved gait efficiency and endurance for gait. Baseline: 200 ft Goal status: IN PROGRESS  ASSESSMENT:  CLINICAL IMPRESSION: Pt arrives today stating he wore foot up brace all day yesterday and even worked in yard with it on to pick up sticks.  He tried to put it on today and noticed irritation in posterior calf area; upon inspection, he does have some redness in lower gastroc/heelcord area.  It appears to lessen throughout session with brace off, so instructed pt in less aggressive wear time schedule and skin inspection.  Worked also on stretching, strengthening to RLE.  Pt continues to have significant tightness and decreased strength RLE and continues to benefit from skilled PT to work on improved weightbearing, NMR, strength to RLE for improved gait mechanics and functional mobility.    OBJECTIVE IMPAIRMENTS: Abnormal gait,  decreased balance, decreased knowledge of use of DME, decreased mobility, difficulty walking, decreased ROM, decreased strength, impaired flexibility, impaired tone, and postural dysfunction.   ACTIVITY LIMITATIONS: carrying, standing, squatting, stairs, transfers, and locomotion level  PARTICIPATION LIMITATIONS: shopping, community activity, and yard work  PERSONAL FACTORS: 1-2 comorbidities: see above  are also affecting patient's functional outcome.   REHAB POTENTIAL: Good  CLINICAL DECISION MAKING: Evolving/moderate complexity  EVALUATION COMPLEXITY: Moderate  PLAN:  PT FREQUENCY: 2x/week  PT DURATION: 6 weeks plus eval session, = 7 weeks total POC  PLANNED INTERVENTIONS: Therapeutic exercises, Therapeutic activity, Neuromuscular re-education, Balance training, Gait training, Patient/Family education, Self Care, Stair training, Orthotic/Fit training, DME instructions, and Manual therapy  PLAN FOR NEXT SESSION: Review updates to HEP; continue to progress stretching (try hip flexor stretch, hamstrings, gastrocs); work on RLE strengthening (try sit to stand stride stance and try single limb step ups).  Try gait with cane, walking pole for optimal gait mechanics/pattern  Gean Maidens., PT 07/07/2022, 12:35 PM  [facility] [address] [place], [address] [place], [place] [address] Phone # [phone] Fax # [phone]

## 2022-07-11 ENCOUNTER — Inpatient Hospital Stay: Payer: Medicare Other | Attending: Internal Medicine | Admitting: Internal Medicine

## 2022-07-11 VITALS — BP 155/84 | HR 62 | Temp 97.6°F | Resp 17 | Ht 72.0 in | Wt 169.0 lb

## 2022-07-11 DIAGNOSIS — C7931 Secondary malignant neoplasm of brain: Secondary | ICD-10-CM | POA: Insufficient documentation

## 2022-07-11 DIAGNOSIS — C3432 Malignant neoplasm of lower lobe, left bronchus or lung: Secondary | ICD-10-CM | POA: Insufficient documentation

## 2022-07-11 DIAGNOSIS — C3490 Malignant neoplasm of unspecified part of unspecified bronchus or lung: Secondary | ICD-10-CM | POA: Diagnosis not present

## 2022-07-11 DIAGNOSIS — Z79899 Other long term (current) drug therapy: Secondary | ICD-10-CM | POA: Diagnosis not present

## 2022-07-11 MED ORDER — PREDNISONE 50 MG PO TABS: ORAL_TABLET | ORAL | 0 refills | Status: DC

## 2022-07-11 NOTE — Progress Notes (Signed)
[facility] [phone]   [phone]  OFFICE PROGRESS NOTE  [name], MD [address] [place] [address]  DIAGNOSIS:   1) Stage IV (T2a, N1, M1b) non-small cell lung cancer, adenocarcinoma, negative EGFR mutation but equivocal EGFR amplification, negative ALK gene translocation and negative ROS 1 but with PDL-1 expression 100% presented with left lower lobe lung mass in addition to left hilar adenopathy and solitary metastatic brain lesion diagnosed in December 2016. 2) nonocclusive right upper lobe pulmonary artery embolism diagnosed on incidental CT scan of the chest on 06/04/2019   PRIOR THERAPY: 1) status post stereotactic radiotherapy and surgical resection diagnosed in December 2016. 2) status post video bronchoscopy with left VATS and wedge resection of the left upper lobe and left lower lobe superior segmentectomy with lymph node dissection under the care of Dr. Tyrone Sage on 01/14/2015. 3) First-line treatment with immunotherapy with Ketruda (pembrolizumab) 200 mg IV every 3 weeks status post 35 cycles. First cycle was given 02/18/2015.  Discontinued after the patient completed 2 years of treatment. 4) stereotactic left frontal craniotomy for resection of tumor and the final pathology was consistent with tumor necrosis.  This was done on 05/12/2017 5) Xarelto 15 mg p.o. twice daily for 3 weeks followed by 20 mg p.o. daily.  First dose started 06/04/2019 and discontinued on June 16, 2020     CURRENT THERAPY: Observation.  INTERVAL HISTORY: Timothy Obrien 62 y.o. male returns to the clinic today for 6 months follow up visit.  The patient is feeling fine today with no concerning complaints.  He developed some allergic reaction to the IV dye during his last scan.  He denied having any current chest pain, shortness of breath, cough or hemoptysis.  He has no nausea, vomiting, diarrhea or constipation.  He has no headache or visual  changes.  He denied having any fever or chills.  He has no recent weight loss or night sweats.  He had repeat CT scan of the chest, abdomen and pelvis performed recently and he is here for evaluation and discussion of his scan results.  MEDICAL HISTORY: Past Medical History:  Diagnosis Date   Anxiety    Bronchitis    Chronic fatigue 09/17/2015   Depression 02/11/2016   Hypertension 12/31/2015   states never been told   Lung cancer (HCC) dx'd 2016   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   Pneumonia    Seizures (HCC)    last 12/11/14   Shortness of breath dyspnea    with exertion   Tobacco abuse     ALLERGIES:  is allergic to iodinated contrast media.  MEDICATIONS:  Current Outpatient Medications  Medication Sig Dispense Refill   acetaminophen (TYLENOL) 500 MG tablet Take 500 mg by mouth 2 (two) times daily as needed.     aspirin EC 81 MG tablet Take 81 mg by mouth daily. Swallow whole.     cetirizine (ZYRTEC) 10 MG tablet Take 10 mg by mouth daily.     ibuprofen (ADVIL,MOTRIN) 600 MG tablet Take 600 mg by mouth 2 (two) times daily as needed.     sertraline (ZOLOFT) 25 MG tablet Take 1 tablet (25 mg total) by mouth daily. 60 tablet 2   No current facility-administered medications for this visit.    SURGICAL HISTORY:  Past Surgical History:  Procedure Laterality Date   APPLICATION OF CRANIAL NAVIGATION N/A 12/26/2014   Procedure: APPLICATION OF CRANIAL NAVIGATION;  Surgeon: Loura Halt Ditty, MD;  Location: [facility];  Service: Neurosurgery;  Laterality: N/A;   APPLICATION OF CRANIAL NAVIGATION Left 05/12/2017   Procedure: APPLICATION OF CRANIAL NAVIGATION;  Surgeon: Lisbeth Renshaw, MD;  Location: [facility] OR;  Service: Neurosurgery;  Laterality: Left;  APPLICATION OF CRANIAL NAVIGATION   CRANIOTOMY N/A 12/26/2014   Procedure: CRANIOTOMY TUMOR EXCISION with BrainLab;  Surgeon: Loura Halt Ditty, MD;  Location: [facility];  Service: Neurosurgery;  Laterality: N/A;  CRANIOTOMY  TUMOR EXCISION with Stealth   CRANIOTOMY Left 05/12/2017   Procedure: STEREOTACTIC LEFT FRONTOPERIETIAL CRANIOTOMY FOR RESECTION OF TUMOR;  Surgeon: Lisbeth Renshaw, MD;  Location: [facility] OR;  Service: Neurosurgery;  Laterality: Left;  STEREOTACTIC LEFT FRONTOPERIETIAL CRANIOTOMY FOR RESECTION OF TUMOR   HERNIA REPAIR     VASECTOMY     VIDEO ASSISTED THORACOSCOPY (VATS)/WEDGE RESECTION Left 01/14/2015   Procedure: VIDEO ASSISTED THORACOSCOPY (VATS)/WEDGE RESECTION, Superior segmentectomy left  lower lobe, wedge resection of left upper lobe, multiple lymph node disection, On Q insertion.;  Surgeon: Delight Ovens, MD;  Location: [facility] OR;  Service: Thoracic;  Laterality: Left;   VIDEO BRONCHOSCOPY Bilateral 12/16/2014   Procedure: VIDEO BRONCHOSCOPY WITH FLUORO;  Surgeon: Leslye Peer, MD;  Location[facility];  Service: Cardiopulmonary;  Laterality: Bilateral;   VIDEO BRONCHOSCOPY N/A 01/14/2015   Procedure: VIDEO BRONCHOSCOPY;  Surgeon: Delight Ovens, MD;  Location: [facility] OR;  Service: Thoracic;  Laterality: N/A;    REVIEW OF SYSTEMS:  A comprehensive review of systems was negative except for: Musculoskeletal: positive for arthralgias   PHYSICAL EXAMINATION: General appearance: alert, cooperative, and no distress Head: Normocephalic, without obvious abnormality, atraumatic Neck: no adenopathy, no JVD, supple, symmetrical, trachea midline, and thyroid not enlarged, symmetric, no tenderness/mass/nodules Lymph nodes: Cervical, supraclavicular, and axillary nodes normal. Resp: clear to auscultation bilaterally Back: symmetric, no curvature. ROM normal. No CVA tenderness. Cardio: regular rate and rhythm, S1, S2 normal, no murmur, click, rub or gallop GI: soft, non-tender; bowel sounds normal; no masses,  no organomegaly Extremities: extremities normal, atraumatic, no cyanosis or edema  ECOG PERFORMANCE STATUS: 1 - Symptomatic but completely ambulatory  Blood pressure (!) 155/84, pulse 62,  temperature 97.6 F (36.4 C), temperature source Oral, resp. rate 17, height 6' (1.829 m), weight 169 lb (76.7 kg), SpO2 100 %.  LABORATORY DATA: Lab Results  Component Value Date   WBC 6.3 06/30/2022   HGB 15.3 06/30/2022   HCT 44.3 06/30/2022   MCV 94.3 06/30/2022   PLT 300 06/30/2022      Chemistry      Component Value Date/Time   NA 130 (L) 06/30/2022 0906   NA 137 01/12/2017 0846   K 4.3 06/30/2022 0906   K 4.4 01/12/2017 0846   CL 99 06/30/2022 0906   CO2 24 06/30/2022 0906   CO2 18 (L) 01/12/2017 0846   BUN 8 06/30/2022 0906   BUN 15.3 01/12/2017 0846   CREATININE 0.98 06/30/2022 0906   CREATININE 1.0 01/12/2017 0846      Component Value Date/Time   CALCIUM 9.0 06/30/2022 0906   CALCIUM 9.0 01/12/2017 0846   ALKPHOS 68 06/30/2022 0906   ALKPHOS 99 01/12/2017 0846   AST 39 06/30/2022 0906   AST 23 01/12/2017 0846   ALT 28 06/30/2022 0906   ALT 22 01/12/2017 0846   BILITOT 0.5 06/30/2022 0906   BILITOT 0.41 01/12/2017 0846       RADIOGRAPHIC STUDIES: CT Chest W Contrast  Result Date: 07/04/2022 CLINICAL DATA:  Stage  IV non-small cell left lung cancer. History of immunotherapy. Restaging. * Tracking Code: BO * EXAM: CT CHEST, ABDOMEN, AND PELVIS WITH CONTRAST TECHNIQUE: Multidetector CT imaging of the chest, abdomen and pelvis was performed following the standard protocol during bolus administration of intravenous contrast. RADIATION DOSE REDUCTION: This exam was performed according to the departmental dose-optimization program which includes automated exposure control, adjustment of the mA and/or kV according to patient size and/or use of iterative reconstruction technique. CONTRAST:  OMNIPAQUE IOHEXOL 300 MG/ML  SOLN COMPARISON:  12/28/2021 CT chest, abdomen and pelvis. FINDINGS: CT CHEST FINDINGS Cardiovascular: Normal heart size. No significant pericardial effusion/thickening. Left anterior descending and right coronary atherosclerosis. Atherosclerotic  nonaneurysmal thoracic aorta. Normal caliber pulmonary arteries. No central pulmonary emboli. Mediastinum/Nodes: No significant thyroid nodules. Unremarkable esophagus. No pathologically enlarged axillary, mediastinal or hilar lymph nodes. Lungs/Pleura: No pneumothorax. No pleural effusion. Moderate to severe centrilobular emphysema with mild diffuse bronchial wall thickening. No acute consolidative airspace disease or lung masses. Stable postsurgical changes from superior segment wedge resection in the left lower lobe. Subpleural 0.4 cm peripheral basilar left lower lobe solid pulmonary nodule (series 508/image 158), stable. No new significant pulmonary nodules. Musculoskeletal: No aggressive appearing focal osseous lesions. Minimal thoracic spondylosis. CT ABDOMEN PELVIS FINDINGS Hepatobiliary: Normal liver with no liver mass. Normal gallbladder with no radiopaque cholelithiasis. No biliary ductal dilatation. Pancreas: Normal, with no mass or duct dilation. Spleen: Normal size. No mass. Adrenals/Urinary Tract: Normal adrenals. Symmetric normal contrast nephrograms. No hydronephrosis. Subcentimeter hypodense posterior upper right renal cortical lesion is too small to characterize and is unchanged, for which no follow-up imaging is recommended. No additional renal lesions. Normal bladder. Stomach/Bowel: Normal non-distended stomach. Normal caliber small bowel with no small bowel wall thickening. Normal appendix. Mild sigmoid diverticulosis with no large bowel wall thickening or acute pericolonic fat stranding. Vascular/Lymphatic: Atherosclerotic nonaneurysmal abdominal aorta. Patent portal, splenic, hepatic and renal veins. No pathologically enlarged lymph nodes in the abdomen or pelvis. Reproductive: Top-normal size prostate. Other: No pneumoperitoneum, ascites or focal fluid collection. Small fat containing periumbilical hernia, unchanged. Musculoskeletal: No aggressive appearing focal osseous lesions. Moderate  degenerative disc disease at L5-S1. IMPRESSION: 1. No evidence of local tumor recurrence in the left lung. 2. No evidence of recurrent metastatic disease in the chest, abdomen or pelvis. 3. Chronic findings include: Two-vessel coronary atherosclerosis. Mild sigmoid diverticulosis. Aortic Atherosclerosis (ICD10-I70.0) and Emphysema (ICD10-J43.9). Electronically Signed   By: Delbert Phenix M.D.   On: 07/04/2022 15:24   CT Abdomen Pelvis W Contrast  Result Date: 07/04/2022 CLINICAL DATA:  Stage IV non-small cell left lung cancer. History of immunotherapy. Restaging. * Tracking Code: BO * EXAM: CT CHEST, ABDOMEN, AND PELVIS WITH CONTRAST TECHNIQUE: Multidetector CT imaging of the chest, abdomen and pelvis was performed following the standard protocol during bolus administration of intravenous contrast. RADIATION DOSE REDUCTION: This exam was performed according to the departmental dose-optimization program which includes automated exposure control, adjustment of the mA and/or kV according to patient size and/or use of iterative reconstruction technique. CONTRAST:  OMNIPAQUE IOHEXOL 300 MG/ML  SOLN COMPARISON:  12/28/2021 CT chest, abdomen and pelvis. FINDINGS: CT CHEST FINDINGS Cardiovascular: Normal heart size. No significant pericardial effusion/thickening. Left anterior descending and right coronary atherosclerosis. Atherosclerotic nonaneurysmal thoracic aorta. Normal caliber pulmonary arteries. No central pulmonary emboli. Mediastinum/Nodes: No significant thyroid nodules. Unremarkable esophagus. No pathologically enlarged axillary, mediastinal or hilar lymph nodes. Lungs/Pleura: No pneumothorax. No pleural effusion. Moderate to severe centrilobular emphysema with mild diffuse bronchial wall thickening. No  acute consolidative airspace disease or lung masses. Stable postsurgical changes from superior segment wedge resection in the left lower lobe. Subpleural 0.4 cm peripheral basilar left lower lobe solid  pulmonary nodule (series 508/image 158), stable. No new significant pulmonary nodules. Musculoskeletal: No aggressive appearing focal osseous lesions. Minimal thoracic spondylosis. CT ABDOMEN PELVIS FINDINGS Hepatobiliary: Normal liver with no liver mass. Normal gallbladder with no radiopaque cholelithiasis. No biliary ductal dilatation. Pancreas: Normal, with no mass or duct dilation. Spleen: Normal size. No mass. Adrenals/Urinary Tract: Normal adrenals. Symmetric normal contrast nephrograms. No hydronephrosis. Subcentimeter hypodense posterior upper right renal cortical lesion is too small to characterize and is unchanged, for which no follow-up imaging is recommended. No additional renal lesions. Normal bladder. Stomach/Bowel: Normal non-distended stomach. Normal caliber small bowel with no small bowel wall thickening. Normal appendix. Mild sigmoid diverticulosis with no large bowel wall thickening or acute pericolonic fat stranding. Vascular/Lymphatic: Atherosclerotic nonaneurysmal abdominal aorta. Patent portal, splenic, hepatic and renal veins. No pathologically enlarged lymph nodes in the abdomen or pelvis. Reproductive: Top-normal size prostate. Other: No pneumoperitoneum, ascites or focal fluid collection. Small fat containing periumbilical hernia, unchanged. Musculoskeletal: No aggressive appearing focal osseous lesions. Moderate degenerative disc disease at L5-S1. IMPRESSION: 1. No evidence of local tumor recurrence in the left lung. 2. No evidence of recurrent metastatic disease in the chest, abdomen or pelvis. 3. Chronic findings include: Two-vessel coronary atherosclerosis. Mild sigmoid diverticulosis. Aortic Atherosclerosis (ICD10-I70.0) and Emphysema (ICD10-J43.9). Electronically Signed   By: Delbert Phenix M.D.   On: 07/04/2022 15:24   MR BRAIN W WO CONTRAST  Result Date: 06/24/2022 CLINICAL DATA:  Malignant neoplasm metastatic to brain, assess treatment response EXAM: MRI HEAD WITHOUT AND WITH  CONTRAST TECHNIQUE: Multiplanar, multiecho pulse sequences of the brain and surrounding structures were obtained without and with intravenous contrast. CONTRAST:  7 mL Vueway COMPARISON:  06/21/2021, 06/19/2020 FINDINGS: Brain: Status post left frontoparietal craniotomy with subjacent resection cavity in the posterior left frontal lobe, status post radiation therapy. Unchanged appearance of the resection cavity, with linear enhancement along the posterior aspect and a more rounded focus of enhancement at the inferior aspect, which measures up to 5 mm, stable compared to 06/21/2021 and 06/19/2020. No new abnormal parenchymal or meningeal enhancement to suggest new metastatic disease. No restricted diffusion to suggest acute or subacute infarct. No acute hemorrhage, mass, mass effect, or midline shift. No hydrocephalus or extra-axial collection. Vascular: Normal arterial flow voids. Normal arterial and venous enhancement. Skull and upper cervical spine: Left frontoparietal craniotomy. Otherwise normal marrow signal. Sinuses/Orbits: Right maxillary mucous retention cyst. No acute finding in the orbits. Other: Trace fluid in right mastoid air cells. IMPRESSION: Stable post treatment changes in the left frontal lobe. No evidence of new metastatic disease or other acute intracranial process. Electronically Signed   By: Wiliam Ke M.D.   On: 06/24/2022 23:30    ASSESSMENT AND PLAN:  This is a very pleasant 62 years old white male with metastatic non-small cell lung cancer, adenocarcinoma with positive PDL 1 expression of 100%.  This was diagnosed in December 2016 and presented with left lower lobe lung mass in addition to left hilar adenopathy and solitary brain metastasis. He completed treatment with Ketruda (pembrolizumab) 200 mg IV every 3 weeks, status post 35 cycles, a total of 2 years. The patient has been on observation since that time and he is feeling fine with no concerning complaints. He had repeat CT  scan of the chest, abdomen and pelvis performed recently.  I personally  and independently reviewed the scan and discussed the result with the patient today. His scan showed no concerning findings for disease recurrence or metastasis. I recommended for the patient to continue on observation with repeat CT scan of the chest, abdomen and pelvis in 6 months. The patient was advised to call immediately if he has any other concerning symptoms in the interval. The patient voices understanding of current disease status and treatment options and is in agreement with the current care plan.  All questions were answered. The patient knows to call the clinic with any problems, questions or concerns. We can certainly see the patient much sooner if necessary.  The total time spent in the appointment was 20 minutes.  Disclaimer: This note was dictated with voice recognition software. Similar sounding words can inadvertently be transcribed and may not be corrected upon review.

## 2022-07-12 ENCOUNTER — Telehealth: Payer: Self-pay | Admitting: Internal Medicine

## 2022-07-12 ENCOUNTER — Ambulatory Visit: Payer: Medicare Other | Attending: Internal Medicine | Admitting: Physical Therapy

## 2022-07-12 ENCOUNTER — Encounter: Payer: Self-pay | Admitting: Physical Therapy

## 2022-07-12 DIAGNOSIS — M6281 Muscle weakness (generalized): Secondary | ICD-10-CM | POA: Insufficient documentation

## 2022-07-12 DIAGNOSIS — R2689 Other abnormalities of gait and mobility: Secondary | ICD-10-CM | POA: Insufficient documentation

## 2022-07-12 DIAGNOSIS — R2681 Unsteadiness on feet: Secondary | ICD-10-CM | POA: Insufficient documentation

## 2022-07-12 NOTE — Therapy (Signed)
OUTPATIENT PHYSICAL THERAPY NEURO TREATMENT NOTE   Patient Name: Timothy Obrien MRN: [id number] DOB:08-28-1960, 62 y.o., male Today's Date: 07/12/2022   PCP: [name], MD REFERRING PROVIDER: Henreitta Leber, MD   END OF SESSION:  PT End of Session - 07/12/22 0931     Visit Number 4    Number of Visits 13    Date for PT Re-Evaluation 08/30/22    Authorization Type Medicare/[employer] Supplemental    Progress Note Due on Visit 10    PT Start Time 0934    PT Stop Time 1014    PT Time Calculation (min) 40 min    Equipment Utilized During Treatment Gait belt   would benefit from gait belt due to fall risk   Activity Tolerance Patient tolerated treatment well    Behavior During Therapy [facility] for tasks assessed/performed              Past Medical History:  Diagnosis Date   Anxiety    Bronchitis    Chronic fatigue 09/17/2015   Depression 02/11/2016   Hypertension 12/31/2015   states never been told   Lung cancer (HCC) dx'd 2016   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   Pneumonia    Seizures (HCC)    last 12/11/14   Shortness of breath dyspnea    with exertion   Tobacco abuse    Past Surgical History:  Procedure Laterality Date   APPLICATION OF CRANIAL NAVIGATION N/A 12/26/2014   Procedure: APPLICATION OF CRANIAL NAVIGATION;  Surgeon: Loura Halt Ditty, MD;  Location: [facility] NEURO ORS;  Service: Neurosurgery;  Laterality: N/A;   APPLICATION OF CRANIAL NAVIGATION Left 05/12/2017   Procedure: APPLICATION OF CRANIAL NAVIGATION;  Surgeon: Lisbeth Renshaw, MD;  Location: [facility] OR;  Service: Neurosurgery;  Laterality: Left;  APPLICATION OF CRANIAL NAVIGATION   CRANIOTOMY N/A 12/26/2014   Procedure: CRANIOTOMY TUMOR EXCISION with BrainLab;  Surgeon: Loura Halt Ditty, MD;  Location: [facility];  Service: Neurosurgery;  Laterality: N/A;  CRANIOTOMY TUMOR EXCISION with Stealth   CRANIOTOMY Left 05/12/2017   Procedure: STEREOTACTIC LEFT FRONTOPERIETIAL CRANIOTOMY FOR  RESECTION OF TUMOR;  Surgeon: Lisbeth Renshaw, MD;  Location: [facility] OR;  Service: Neurosurgery;  Laterality: Left;  STEREOTACTIC LEFT FRONTOPERIETIAL CRANIOTOMY FOR RESECTION OF TUMOR   HERNIA REPAIR     VASECTOMY     VIDEO ASSISTED THORACOSCOPY (VATS)/WEDGE RESECTION Left 01/14/2015   Procedure: VIDEO ASSISTED THORACOSCOPY (VATS)/WEDGE RESECTION, Superior segmentectomy left  lower lobe, wedge resection of left upper lobe, multiple lymph node disection, On Q insertion.;  Surgeon: Delight Ovens, MD;  Location: [facility] OR;  Service: Thoracic;  Laterality: Left;   VIDEO BRONCHOSCOPY Bilateral 12/16/2014   Procedure: VIDEO BRONCHOSCOPY WITH FLUORO;  Surgeon: Leslye Peer, MD;  Location: [facility] ENDOSCOPY;  Service: Cardiopulmonary;  Laterality: Bilateral;   VIDEO BRONCHOSCOPY N/A 01/14/2015   Procedure: VIDEO BRONCHOSCOPY;  Surgeon: Delight Ovens, MD;  Location: [facility] OR;  Service: Thoracic;  Laterality: N/A;   Patient Active Problem List   Diagnosis Date Noted   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   Brain tumor (HCC) 05/12/2017   Right leg weakness 01/12/2017   Depression 02/11/2016   Hypertension 12/31/2015   Chronic fatigue 09/17/2015   Shoulder pain, left 08/06/2015   Encounter for antineoplastic immunotherapy 03/12/2015   Malnutrition of moderate degree 01/17/2015   S/P craniotomy 12/26/2014   Malignant neoplasm metastatic to brain (HCC) 12/26/2014   Primary cancer of left lower lobe of lung (  HCC) 12/22/2014   Right sided weakness 12/12/2014   Tobacco abuse     ONSET DATE: 06/21/2022 (MD referral)  REFERRING DIAG:  C79.31 (ICD-10-CM) - Malignant neoplasm metastatic to brain (HCC)  R53.1 (ICD-10-CM) - Right sided weakness    THERAPY DIAG:  Other abnormalities of gait and mobility  Unsteadiness on feet  Muscle weakness (generalized)  Rationale for Evaluation and Treatment: Rehabilitation  SUBJECTIVE:                                                                                                                                                                                              SUBJECTIVE STATEMENT: Have been wearing the brace, but just for shorter periods.  Don't have a good firm bed to work on a couple of stretches. Pt accompanied by: self  PERTINENT HISTORY: lung cancer with brain metastasis s/p crainiotomy 2016; radiation   PAIN:  Are you having pain? No  PRECAUTIONS: Fall  WEIGHT BEARING RESTRICTIONS: No  FALLS: Has patient fallen in last 6 months? No Hx of falls in the past; has had rib fractures LIVING ENVIRONMENT: Lives with: lives alone Lives in: House/apartment Stairs: Yes: Internal: 12 steps; does not go up the steps and External: 1 steps; none Has following equipment at home: Single point cane and walking poles Has foot-up brace, does not use  PLOF: Independent and Leisure: reports not doing as much due to decreased confidence  PATIENT GOALS: Improve R leg strength, balance, improved walking  OBJECTIVE:    TODAY'S TREATMENT: 07/12/2022 Activity Comments  NuStep, Level 3>4, 4 extremities, x 8 minutes Cues to keep SPM >60  Sitting hamstring stretch Better able to perform foot propped on floor than long sit on mat  Standing gastroc stretch, standing hip flexor stretch   Reviewed sit<>stand 10 reps  Good form, fatigues at end of set  Forward step ups RLE 2 x 10 reps BUE support, decreased R knee control  RLE as stance with LLE forward step taps>runner's stretch position BUE support and cues for technique  RLE as stance with LLE forward>back step, at counter for support      Gait training with cane, 50 ft x 6 reps, wearing R foot up brace Supervision, min guard, decreased foot clearance and decreased R knee extension in stance    Access Code: [email or web address] URL: [email or web address] Date: 07/07/2022 Prepared by: [facility] - Outpatient  Rehab - [facility]  Exercises - Seated Hamstring stretch  - 2 x  daily - 7 x weekly - 1 sets - 3 reps - 30 sec hold - Standing Gastroc Stretch at Counter  - 2 x  daily - 7 x weekly - 1 sets - 3 reps - 30 sec hold - Hip Flexion  - 1 x daily - 5 x weekly - 2 sets - 10 reps - Seated Table Hamstring Stretch  - 1-2 x daily - 7 x weekly - 1 sets - 3 reps - 30 sec hold - Sit to Stand  - 1-2 x daily - 7 x weekly - 2 sets - 5 reps - 3 sec hold  PATIENT EDUCATION: Education details: Discussed backing off wear time and skin inspection , updates to HEP Person educated: Patient Education method: Programmer, multimedia, Demonstration, Verbal cues, and Handouts Education comprehension: verbalized understanding, returned demonstration, and needs further education  -------------------------------------------------- Objective measures below taken at initial evaluation: DIAGNOSTIC FINDINGS: NA   COGNITION: Overall cognitive status: Within functional limits for tasks assessed   SENSATION: Light touch: WFL  MUSCLE TONE: RLE: Moderate and clonus   LOWER EXTREMITY ROM:     Active  Right Eval Left Eval  Hip flexion    Hip extension    Hip abduction    Hip adduction    Hip internal rotation    Hip external rotation    Knee flexion    Knee extension -8   Ankle dorsiflexion Neutral (passive +5)   Ankle plantarflexion    Ankle inversion    Ankle eversion     (Blank rows = not tested)  LOWER EXTREMITY MMT:    MMT Right Eval Left Eval  Hip flexion 3+ 4  Hip extension    Hip abduction    Hip adduction    Hip internal rotation    Hip external rotation    Knee flexion 3+ 4  Knee extension 4 4+  Ankle dorsiflexion 3-   Ankle plantarflexion 3-   Ankle inversion    Ankle eversion    (Blank rows = not tested)   TRANSFERS: Assistive device utilized: None  Sit to stand: Modified independence and increased weightbearing through LLE Stand to sit: Modified independence and increased weightbearing through LLE  STAIRS: Level of Assistance: CGA Stair Negotiation  Technique: Step to Pattern with Single Rail on Left Number of Stairs: 2-3  Height of Stairs: 4-6"  Comments: reports using step to pattern at home; descends with LLE leading, decreased R foot clearance  GAIT: Gait pattern: step to pattern, step through pattern, decreased arm swing- Right, decreased step length- Right, decreased stance time- Right, decreased ankle dorsiflexion- Right, circumduction- Right, Right hip hike, decreased trunk rotation, and poor foot clearance- Right Distance walked: 50 ft Assistive device utilized: None Level of assistance: SBA and CGA   FUNCTIONAL TESTS:  5 times sit to stand: 22.53 sec Timed up and go (TUG): 19.94 sec 2 minute walk test: 200 ft 10 meter walk test: 19.34sec  (1.7 ft/sec)    TODAY'S TREATMENT:  DATE: 06/28/2022    PATIENT EDUCATION: Education details: PT eval results, POC, initial HEP Person educated: Patient Education method: Explanation, Demonstration, and Handouts Education comprehension: verbalized understanding, returned demonstration, and needs further education  HOME EXERCISE PROGRAM: Access Code: [email or web address] URL: [email or web address] Date: 06/28/2022 Prepared by: [facility] - Outpatient  Rehab - [facility]  Exercises - Seated Hamstring stretch  - 2 x daily - 7 x weekly - 1 sets - 3 reps - 30 sec hold - Standing Gastroc Stretch at Counter  - 2 x daily - 7 x weekly - 1 sets - 3 reps - 30 sec hold  GOALS: Goals reviewed with patient? Yes  SHORT TERM GOALS: Target date: 07/29/2022  Pt will be independent with HEP for improved strength, flexibility, balance, gait. Baseline:  Goal status: IN PROGRESS  2.  Pt will improve 5x sit<>stand to less than or equal to 18 sec to demonstrate improved functional strength and transfer efficiency. Baseline: 22.53 sec Goal status: IN  PROGRESS  LONG TERM GOALS: Target date: 08/12/2022  Pt will be independent with HEP for improved strength, flexibility, balance, gait. Baseline:  Goal status: IN PROGRESS  2.  Pt will improve 5x sit<>stand to less than or equal to 12 sec to demonstrate improved functional strength and transfer efficiency. Baseline: 22.53 sec Goal status: IN PROGRESS  3.  Pt will improve TUG score to less than or equal to 15 sec for decreased fall risk. Baseline: 19.94 sec Goal status: IN PROGRESS  4.  Pt will improve gait velocity to at least 2 ft/sec for improved gait efficiency and safety. Baseline: 1.7 ft/sec Goal status: IN PROGRESS  5.  Pt will improve 2 MWT to at least 250 ft for improved gait efficiency and endurance for gait. Baseline: 200 ft Goal status: IN PROGRESS  ASSESSMENT:  CLINICAL IMPRESSION: Worked in skilled PT session today with strength and flexibility to RLE and progressed to standing NMR for RLE.  Pt has difficulty with achieving full R knee extension with RLE as SLS or in stance phase of gait.  Gait training with cane yields improved step through pattern of gait, but decreased timing of R hip/knee flexion for initial swing through phase of gait.  He reports no further skin issues with foot-up brace and has been wearing for shorter bouts.  He will continue to benefit from further skilled PT to work on improved weightbearing, NMR, strength to RLE for improved gait mechanics and functional mobility.    OBJECTIVE IMPAIRMENTS: Abnormal gait, decreased balance, decreased knowledge of use of DME, decreased mobility, difficulty walking, decreased ROM, decreased strength, impaired flexibility, impaired tone, and postural dysfunction.   ACTIVITY LIMITATIONS: carrying, standing, squatting, stairs, transfers, and locomotion level  PARTICIPATION LIMITATIONS: shopping, community activity, and yard work  PERSONAL FACTORS: 1-2 comorbidities: see above  are also affecting patient's functional  outcome.   REHAB POTENTIAL: Good  CLINICAL DECISION MAKING: Evolving/moderate complexity  EVALUATION COMPLEXITY: Moderate  PLAN:  PT FREQUENCY: 2x/week  PT DURATION: 6 weeks plus eval session, = 7 weeks total POC  PLANNED INTERVENTIONS: Therapeutic exercises, Therapeutic activity, Neuromuscular re-education, Balance training, Gait training, Patient/Family education, Self Care, Stair training, Orthotic/Fit training, DME instructions, and Manual therapy  PLAN FOR NEXT SESSION: Gait training, NMR in standing for RLE, continue to progress stretching (try hip flexor stretch, hamstrings, gastrocs); work on RLE strengthening (try sit to stand stride stance and try single limb step ups).  Try gait with cane, walking pole for optimal gait mechanics/pattern (pt to bring  in his next visit)   Gean Maidens., PT 07/12/2022, 2:43 PM  [facility] at [facility] [address] [place], [address] [place], [place] [address] Phone # [phone] Fax # [phone]

## 2022-07-19 ENCOUNTER — Ambulatory Visit: Payer: Medicare Other | Admitting: Physical Therapy

## 2022-07-19 ENCOUNTER — Encounter: Payer: Self-pay | Admitting: Physical Therapy

## 2022-07-19 DIAGNOSIS — M6281 Muscle weakness (generalized): Secondary | ICD-10-CM

## 2022-07-19 DIAGNOSIS — R2681 Unsteadiness on feet: Secondary | ICD-10-CM | POA: Diagnosis not present

## 2022-07-19 DIAGNOSIS — R2689 Other abnormalities of gait and mobility: Secondary | ICD-10-CM

## 2022-07-19 NOTE — Therapy (Signed)
OUTPATIENT PHYSICAL THERAPY NEURO TREATMENT NOTE   Patient Name: Timothy Obrien MRN: [id number] DOB:27-Jan-1960, 62 y.o., male Today's Date: 07/19/2022   PCP: [name], MD REFERRING PROVIDER: Henreitta Leber, MD   END OF SESSION:  PT End of Session - 07/19/22 0934     Visit Number 5    Number of Visits 13    Date for PT Re-Evaluation 08/30/22    Authorization Type Medicare/[employer] Supplemental    Progress Note Due on Visit 10    PT Start Time 0934    PT Stop Time 1015    PT Time Calculation (min) 41 min    Equipment Utilized During Treatment Gait belt   would benefit from gait belt due to fall risk   Activity Tolerance Patient tolerated treatment well    Behavior During Therapy [facility] for tasks assessed/performed              Past Medical History:  Diagnosis Date   Anxiety    Bronchitis    Chronic fatigue 09/17/2015   Depression 02/11/2016   Hypertension 12/31/2015   states never been told   Lung cancer (HCC) dx'd 2016   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   Pneumonia    Seizures (HCC)    last 12/11/14   Shortness of breath dyspnea    with exertion   Tobacco abuse    Past Surgical History:  Procedure Laterality Date   APPLICATION OF CRANIAL NAVIGATION N/A 12/26/2014   Procedure: APPLICATION OF CRANIAL NAVIGATION;  Surgeon: Loura Halt Ditty, MD;  Location: [facility] NEURO ORS;  Service: Neurosurgery;  Laterality: N/A;   APPLICATION OF CRANIAL NAVIGATION Left 05/12/2017   Procedure: APPLICATION OF CRANIAL NAVIGATION;  Surgeon: Lisbeth Renshaw, MD;  Location: [facility] OR;  Service: Neurosurgery;  Laterality: Left;  APPLICATION OF CRANIAL NAVIGATION   CRANIOTOMY N/A 12/26/2014   Procedure: CRANIOTOMY TUMOR EXCISION with BrainLab;  Surgeon: Loura Halt Ditty, MD;  Location: [facility];  Service: Neurosurgery;  Laterality: N/A;  CRANIOTOMY TUMOR EXCISION with Stealth   CRANIOTOMY Left 05/12/2017   Procedure: STEREOTACTIC LEFT FRONTOPERIETIAL CRANIOTOMY FOR  RESECTION OF TUMOR;  Surgeon: Lisbeth Renshaw, MD;  Location: [facility] OR;  Service: Neurosurgery;  Laterality: Left;  STEREOTACTIC LEFT FRONTOPERIETIAL CRANIOTOMY FOR RESECTION OF TUMOR   HERNIA REPAIR     VASECTOMY     VIDEO ASSISTED THORACOSCOPY (VATS)/WEDGE RESECTION Left 01/14/2015   Procedure: VIDEO ASSISTED THORACOSCOPY (VATS)/WEDGE RESECTION, Superior segmentectomy left  lower lobe, wedge resection of left upper lobe, multiple lymph node disection, On Q insertion.;  Surgeon: Delight Ovens, MD;  Location: [facility] OR;  Service: Thoracic;  Laterality: Left;   VIDEO BRONCHOSCOPY Bilateral 12/16/2014   Procedure: VIDEO BRONCHOSCOPY WITH FLUORO;  Surgeon: Leslye Peer, MD;  Location: [facility] ENDOSCOPY;  Service: Cardiopulmonary;  Laterality: Bilateral;   VIDEO BRONCHOSCOPY N/A 01/14/2015   Procedure: VIDEO BRONCHOSCOPY;  Surgeon: Delight Ovens, MD;  Location: [facility] OR;  Service: Thoracic;  Laterality: N/A;   Patient Active Problem List   Diagnosis Date Noted   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   Brain tumor (HCC) 05/12/2017   Right leg weakness 01/12/2017   Depression 02/11/2016   Hypertension 12/31/2015   Chronic fatigue 09/17/2015   Shoulder pain, left 08/06/2015   Encounter for antineoplastic immunotherapy 03/12/2015   Malnutrition of moderate degree 01/17/2015   S/P craniotomy 12/26/2014   Malignant neoplasm metastatic to brain (HCC) 12/26/2014   Primary cancer of left lower lobe of lung (  HCC) 12/22/2014   Right sided weakness 12/12/2014   Tobacco abuse     ONSET DATE: 06/21/2022 (MD referral)  REFERRING DIAG:  C79.31 (ICD-10-CM) - Malignant neoplasm metastatic to brain (HCC)  R53.1 (ICD-10-CM) - Right sided weakness    THERAPY DIAG:  Other abnormalities of gait and mobility  Unsteadiness on feet  Muscle weakness (generalized)  Rationale for Evaluation and Treatment: Rehabilitation  SUBJECTIVE:                                                                                                                                                                                              SUBJECTIVE STATEMENT: No pain, no complaints.  Been doing exercises. Pt accompanied by: self  PERTINENT HISTORY: lung cancer with brain metastasis s/p crainiotomy 2016; radiation   PAIN:  Are you having pain? No  PRECAUTIONS: Fall  WEIGHT BEARING RESTRICTIONS: No  FALLS: Has patient fallen in last 6 months? No Hx of falls in the past; has had rib fractures LIVING ENVIRONMENT: Lives with: lives alone Lives in: House/apartment Stairs: Yes: Internal: 12 steps; does not go up the steps and External: 1 steps; none Has following equipment at home: Single point cane and walking poles Has foot-up brace, does not use  PLOF: Independent and Leisure: reports not doing as much due to decreased confidence  PATIENT GOALS: Improve R leg strength, balance, improved walking  OBJECTIVE:     TODAY'S TREATMENT: 07/19/2022 Activity Comments  NuStep, Level 3>4, 4 extremities, x 8 minutes Cues to keep SPM >60-70  Seated LAQ, 2 x 10, RLE 3# Cues for posture  Seated march, 2 x 10, RLE 3# Cues to avoid hip external rotation, improves form with cues  Sit<>stand, ball raise overhead 2 x 5 reps Initial cues for full knee extension RLE  Sit<>stand, RLE posterior position, 10 reps  Good form, fatigues at end of set  Forward step ups RLE 2 x 10 reps, LLE hangs in air BUE support, cues for full knee extension     Forward/back walking in parallel bars  For Timing and coordination     Gait training with single walking pole, 85 ft x 2 reps, wearing R foot up brace Supervision, min guard, decreased foot clearance and decreased R knee extension in stance.  Cues for sequence and step length   Access Code: [email or web address] URL: [email or web address] Date: 07/19/2022 Prepared by: [facility] - Outpatient  Rehab - [facility]  Exercises - Seated Hamstring stretch  - 2 x daily - 7 x  weekly - 1 sets - 3 reps - 30 sec hold - Standing Gastroc Stretch at [employer]  -  2 x daily - 7 x weekly - 1 sets - 3 reps - 30 sec hold - Hip Flexion  - 1 x daily - 5 x weekly - 2 sets - 10 reps - Seated Table Hamstring Stretch  - 1-2 x daily - 7 x weekly - 1 sets - 3 reps - 30 sec hold - Sit to Stand  - 1-2 x daily - 7 x weekly - 2 sets - 5 reps - 3 sec hold - Seated Long Arc Quad with Ankle Weight  - 1 x daily - 5 x weekly - 3 sets - 10 reps - Seated Hip Flexion March with Ankle Weights  - 1 x daily - 5 x weekly - 3 sets - 10 reps  PATIENT EDUCATION: Education details: Updates to HEP Person educated: Patient Education method: Programmer, multimedia, Facilities manager, Verbal cues, and Handouts Education comprehension: verbalized understanding, returned demonstration, and needs further education  -------------------------------------------------- Objective measures below taken at initial evaluation: DIAGNOSTIC FINDINGS: NA   COGNITION: Overall cognitive status: Within functional limits for tasks assessed   SENSATION: Light touch: WFL  MUSCLE TONE: RLE: Moderate and clonus   LOWER EXTREMITY ROM:     Active  Right Eval Left Eval  Hip flexion    Hip extension    Hip abduction    Hip adduction    Hip internal rotation    Hip external rotation    Knee flexion    Knee extension -8   Ankle dorsiflexion Neutral (passive +5)   Ankle plantarflexion    Ankle inversion    Ankle eversion     (Blank rows = not tested)  LOWER EXTREMITY MMT:    MMT Right Eval Left Eval  Hip flexion 3+ 4  Hip extension    Hip abduction    Hip adduction    Hip internal rotation    Hip external rotation    Knee flexion 3+ 4  Knee extension 4 4+  Ankle dorsiflexion 3-   Ankle plantarflexion 3-   Ankle inversion    Ankle eversion    (Blank rows = not tested)   TRANSFERS: Assistive device utilized: None  Sit to stand: Modified independence and increased weightbearing through LLE Stand to sit: Modified  independence and increased weightbearing through LLE  STAIRS: Level of Assistance: CGA Stair Negotiation Technique: Step to Pattern with Single Rail on Left Number of Stairs: 2-3  Height of Stairs: 4-6"  Comments: reports using step to pattern at home; descends with LLE leading, decreased R foot clearance  GAIT: Gait pattern: step to pattern, step through pattern, decreased arm swing- Right, decreased step length- Right, decreased stance time- Right, decreased ankle dorsiflexion- Right, circumduction- Right, Right hip hike, decreased trunk rotation, and poor foot clearance- Right Distance walked: 50 ft Assistive device utilized: None Level of assistance: SBA and CGA   FUNCTIONAL TESTS:  5 times sit to stand: 22.53 sec Timed up and go (TUG): 19.94 sec 2 minute walk test: 200 ft 10 meter walk test: 19.34sec  (1.7 ft/sec)    TODAY'S TREATMENT:  DATE: 06/28/2022    PATIENT EDUCATION: Education details: PT eval results, POC, initial HEP Person educated: Patient Education method: Explanation, Demonstration, and Handouts Education comprehension: verbalized understanding, returned demonstration, and needs further education  HOME EXERCISE PROGRAM: Access Code: [email or web address] URL: [email or web address] Date: 06/28/2022 Prepared by: [facility] - Outpatient  Rehab - [facility]  Exercises - Seated Hamstring stretch  - 2 x daily - 7 x weekly - 1 sets - 3 reps - 30 sec hold - Standing Gastroc Stretch at Counter  - 2 x daily - 7 x weekly - 1 sets - 3 reps - 30 sec hold  GOALS: Goals reviewed with patient? Yes  SHORT TERM GOALS: Target date: 07/29/2022  Pt will be independent with HEP for improved strength, flexibility, balance, gait. Baseline:  Goal status: IN PROGRESS  2.  Pt will improve 5x sit<>stand to less than or equal to 18 sec to  demonstrate improved functional strength and transfer efficiency. Baseline: 22.53 sec Goal status: IN PROGRESS  LONG TERM GOALS: Target date: 08/12/2022  Pt will be independent with HEP for improved strength, flexibility, balance, gait. Baseline:  Goal status: IN PROGRESS  2.  Pt will improve 5x sit<>stand to less than or equal to 12 sec to demonstrate improved functional strength and transfer efficiency. Baseline: 22.53 sec Goal status: IN PROGRESS  3.  Pt will improve TUG score to less than or equal to 15 sec for decreased fall risk. Baseline: 19.94 sec Goal status: IN PROGRESS  4.  Pt will improve gait velocity to at least 2 ft/sec for improved gait efficiency and safety. Baseline: 1.7 ft/sec Goal status: IN PROGRESS  5.  Pt will improve 2 MWT to at least 250 ft for improved gait efficiency and endurance for gait. Baseline: 200 ft Goal status: IN PROGRESS  ASSESSMENT:  CLINICAL IMPRESSION: Skilled PT session today focused on strength for RLE and progressed to standing NMR for RLE.  Worked also on gait training, using pt's single walking pole.  He requires cues for sequence of gait with walking pole and to use pole as cue for increased RLE step length and stance time.  He attends better today to full R knee extension, but does this less with fatigue.  He will continue to benefit from further skilled PT to work on improved weightbearing, NMR, strength to RLE for improved gait mechanics and functional mobility.    OBJECTIVE IMPAIRMENTS: Abnormal gait, decreased balance, decreased knowledge of use of DME, decreased mobility, difficulty walking, decreased ROM, decreased strength, impaired flexibility, impaired tone, and postural dysfunction.   ACTIVITY LIMITATIONS: carrying, standing, squatting, stairs, transfers, and locomotion level  PARTICIPATION LIMITATIONS: shopping, community activity, and yard work  PERSONAL FACTORS: 1-2 comorbidities: see above  are also affecting patient's  functional outcome.   REHAB POTENTIAL: Good  CLINICAL DECISION MAKING: Evolving/moderate complexity  EVALUATION COMPLEXITY: Moderate  PLAN:  PT FREQUENCY: 2x/week  PT DURATION: 6 weeks plus eval session, = 7 weeks total POC  PLANNED INTERVENTIONS: Therapeutic exercises, Therapeutic activity, Neuromuscular re-education, Balance training, Gait training, Patient/Family education, Self Care, Stair training, Orthotic/Fit training, DME instructions, and Manual therapy  PLAN FOR NEXT SESSION: Review updates to strength in HEP; Gait training, NMR in standing for RLE, continue to progress stretching (try hip flexor stretch, hamstrings, gastrocs);   Gait with cane, walking pole for optimal gait mechanics/pattern    Gean Maidens., PT 07/19/2022, 11:46 AM  [facility] at [facility] [address] [place], [address] [place], [place] [address] Phone # [phone] Fax # [phone]

## 2022-07-22 ENCOUNTER — Encounter: Payer: Self-pay | Admitting: Physical Therapy

## 2022-07-22 ENCOUNTER — Ambulatory Visit: Payer: Medicare Other | Admitting: Physical Therapy

## 2022-07-22 DIAGNOSIS — R2681 Unsteadiness on feet: Secondary | ICD-10-CM | POA: Diagnosis not present

## 2022-07-22 DIAGNOSIS — M6281 Muscle weakness (generalized): Secondary | ICD-10-CM | POA: Diagnosis not present

## 2022-07-22 DIAGNOSIS — R2689 Other abnormalities of gait and mobility: Secondary | ICD-10-CM | POA: Diagnosis not present

## 2022-07-22 NOTE — Therapy (Signed)
OUTPATIENT PHYSICAL THERAPY NEURO TREATMENT NOTE   Patient Name: Timothy Obrien MRN: [id number] DOB:1960-03-30, 62 y.o., male Today's Date: 07/22/2022   PCP: [name], MD REFERRING PROVIDER: Henreitta Leber, MD   END OF SESSION:  PT End of Session - 07/22/22 0936     Visit Number 6    Number of Visits 13    Date for PT Re-Evaluation 08/30/22    Authorization Type Medicare/[employer] Supplemental    Progress Note Due on Visit 10    PT Start Time 0934    PT Stop Time 1014    PT Time Calculation (min) 40 min    Equipment Utilized During Treatment Gait belt   would benefit from gait belt due to fall risk   Activity Tolerance Patient tolerated treatment well    Behavior During Therapy [facility] for tasks assessed/performed               Past Medical History:  Diagnosis Date   Anxiety    Bronchitis    Chronic fatigue 09/17/2015   Depression 02/11/2016   Hypertension 12/31/2015   states never been told   Lung cancer (HCC) dx'd 2016   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   Pneumonia    Seizures (HCC)    last 12/11/14   Shortness of breath dyspnea    with exertion   Tobacco abuse    Past Surgical History:  Procedure Laterality Date   APPLICATION OF CRANIAL NAVIGATION N/A 12/26/2014   Procedure: APPLICATION OF CRANIAL NAVIGATION;  Surgeon: Loura Halt Ditty, MD;  Location: [facility] NEURO ORS;  Service: Neurosurgery;  Laterality: N/A;   APPLICATION OF CRANIAL NAVIGATION Left 05/12/2017   Procedure: APPLICATION OF CRANIAL NAVIGATION;  Surgeon: Lisbeth Renshaw, MD;  Location: [facility] OR;  Service: Neurosurgery;  Laterality: Left;  APPLICATION OF CRANIAL NAVIGATION   CRANIOTOMY N/A 12/26/2014   Procedure: CRANIOTOMY TUMOR EXCISION with BrainLab;  Surgeon: Loura Halt Ditty, MD;  Location: [facility] NEURO ORS;  Service: Neurosurgery;  Laterality: N/A;  CRANIOTOMY TUMOR EXCISION with Stealth   CRANIOTOMY Left 05/12/2017   Procedure: STEREOTACTIC LEFT FRONTOPERIETIAL CRANIOTOMY FOR  RESECTION OF TUMOR;  Surgeon: Lisbeth Renshaw, MD;  Location: [facility] OR;  Service: Neurosurgery;  Laterality: Left;  STEREOTACTIC LEFT FRONTOPERIETIAL CRANIOTOMY FOR RESECTION OF TUMOR   HERNIA REPAIR     VASECTOMY     VIDEO ASSISTED THORACOSCOPY (VATS)/WEDGE RESECTION Left 01/14/2015   Procedure: VIDEO ASSISTED THORACOSCOPY (VATS)/WEDGE RESECTION, Superior segmentectomy left  lower lobe, wedge resection of left upper lobe, multiple lymph node disection, On Q insertion.;  Surgeon: Delight Ovens, MD;  Location: [facility] OR;  Service: Thoracic;  Laterality: Left;   VIDEO BRONCHOSCOPY Bilateral 12/16/2014   Procedure: VIDEO BRONCHOSCOPY WITH FLUORO;  Surgeon: Leslye Peer, MD;  Location: 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   Brain tumor (HCC) 05/12/2017   Right leg weakness 01/12/2017   Depression 02/11/2016   Hypertension 12/31/2015   Chronic fatigue 09/17/2015   Shoulder pain, left 08/06/2015   Encounter for antineoplastic immunotherapy 03/12/2015   Malnutrition of moderate degree 01/17/2015   S/P craniotomy 12/26/2014   Malignant neoplasm metastatic to brain [facility]) 12/26/2014   Primary cancer of left lower lobe of  lung (HCC) 12/22/2014   Right sided weakness 12/12/2014   Tobacco abuse     ONSET DATE: 06/21/2022 (MD referral)  REFERRING DIAG:  C79.31 (ICD-10-CM) - Malignant neoplasm metastatic to brain (HCC)  R53.1 (ICD-10-CM) - Right sided weakness    THERAPY DIAG:  Other abnormalities of gait and mobility  Unsteadiness on feet  Muscle weakness (generalized)  Rationale for Evaluation and Treatment: Rehabilitation  SUBJECTIVE:                                                                                                                                                                                              SUBJECTIVE STATEMENT: Had to get a crown yesterday; weights came in yesterday. Pt accompanied by: self  PERTINENT HISTORY: lung cancer with brain metastasis s/p crainiotomy 2016; radiation   PAIN:  Are you having pain? No  PRECAUTIONS: Fall  WEIGHT BEARING RESTRICTIONS: No  FALLS: Has patient fallen in last 6 months? No Hx of falls in the past; has had rib fractures LIVING ENVIRONMENT: Lives with: lives alone Lives in: House/apartment Stairs: Yes: Internal: 12 steps; does not go up the steps and External: 1 steps; none Has following equipment at home: Single point cane and walking poles Has foot-up brace, does not use  PLOF: Independent and Leisure: reports not doing as much due to decreased confidence  PATIENT GOALS: Improve R leg strength, balance, improved walking  OBJECTIVE:    TODAY'S TREATMENT: 07/22/2022 Activity Comments  NuStep, Level 3>4, BLEs only  x 8 minutes Clonus noted RLE at times, took several times to rest and stretch  Seated LAQ, 2 x 10, BLE 3# Cues for posture  Seated march, 2 x 10, BLE 3# Less hip external rotation smaller range   Seated hamstring curls/heel slides 2 x 10 Difficulty initiating knee flexion; tried green band resistance and unable to complete  Minisquats x 10, minisquats to up on toes x 10, mini squats to side step x 10 Cues for control, keep R heel on ground  Standing on Airex-forward/back stepping Cues for R knee extension in stance  Wide BOS lateral weightshfiting Cues for full R knee extension for improved weightshift  Wide BOS back weightshifting for BLE hamstring stretch   Gait 50 ft x 3 reps, no device Cues for terminal R knee extension in stance and cues for equal, even step length     Access Code: [email or web address] URL: [email or web address] Date: 07/19/2022 Prepared by: [facility] - Outpatient  Rehab -  [facility]  Exercises - Seated Hamstring stretch  - 2 x daily - 7 x weekly - 1 sets - 3 reps -  30 sec hold - Standing Gastroc Stretch at [employer]  - 2 x daily - 7 x weekly - 1 sets - 3 reps - 30 sec hold - Hip Flexion  - 1 x daily - 5 x weekly - 2 sets - 10 reps - Seated Table Hamstring Stretch  - 1-2 x daily - 7 x weekly - 1 sets - 3 reps - 30 sec hold - Sit to Stand  - 1-2 x daily - 7 x weekly - 2 sets - 5 reps - 3 sec hold - Seated Long Arc Quad with Ankle Weight  - 1 x daily - 5 x weekly - 3 sets - 10 reps - Seated Hip Flexion March with Ankle Weights  - 1 x daily - 5 x weekly - 3 sets - 10 reps  PATIENT EDUCATION: Education details: Updates to HEP Person educated: Patient Education method: Programmer, multimedia, Facilities manager, Verbal cues, and Handouts Education comprehension: verbalized understanding, returned demonstration, and needs further education  -------------------------------------------------- Objective measures below taken at initial evaluation: DIAGNOSTIC FINDINGS: NA   COGNITION: Overall cognitive status: Within functional limits for tasks assessed   SENSATION: Light touch: WFL  MUSCLE TONE: RLE: Moderate and clonus   LOWER EXTREMITY ROM:     Active  Right Eval Left Eval  Hip flexion    Hip extension    Hip abduction    Hip adduction    Hip internal rotation    Hip external rotation    Knee flexion    Knee extension -8   Ankle dorsiflexion Neutral (passive +5)   Ankle plantarflexion    Ankle inversion    Ankle eversion     (Blank rows = not tested)  LOWER EXTREMITY MMT:    MMT Right Eval Left Eval  Hip flexion 3+ 4  Hip extension    Hip abduction    Hip adduction    Hip internal rotation    Hip external rotation    Knee flexion 3+ 4  Knee extension 4 4+  Ankle dorsiflexion 3-   Ankle plantarflexion 3-   Ankle inversion    Ankle eversion    (Blank rows = not tested)   TRANSFERS: Assistive device utilized: None  Sit to stand:  Modified independence and increased weightbearing through LLE Stand to sit: Modified independence and increased weightbearing through LLE  STAIRS: Level of Assistance: CGA Stair Negotiation Technique: Step to Pattern with Single Rail on Left Number of Stairs: 2-3  Height of Stairs: 4-6"  Comments: reports using step to pattern at home; descends with LLE leading, decreased R foot clearance  GAIT: Gait pattern: step to pattern, step through pattern, decreased arm swing- Right, decreased step length- Right, decreased stance time- Right, decreased ankle dorsiflexion- Right, circumduction- Right, Right hip hike, decreased trunk rotation, and poor foot clearance- Right Distance walked: 50 ft Assistive device utilized: None Level of assistance: SBA and CGA   FUNCTIONAL TESTS:  5 times sit to stand: 22.53 sec Timed up and go (TUG): 19.94 sec 2 minute walk test: 200 ft 10 meter walk test: 19.34sec  (1.7 ft/sec)    TODAY'S TREATMENT:  DATE: 06/28/2022    PATIENT EDUCATION: Education details: PT eval results, POC, initial HEP Person educated: Patient Education method: Explanation, Demonstration, and Handouts Education comprehension: verbalized understanding, returned demonstration, and needs further education  HOME EXERCISE PROGRAM: Access Code: [email or web address] URL: [email or web address] Date: 06/28/2022 Prepared by: [facility] - Outpatient  Rehab - [facility]  Exercises - Seated Hamstring stretch  - 2 x daily - 7 x weekly - 1 sets - 3 reps - 30 sec hold - Standing Gastroc Stretch at Counter  - 2 x daily - 7 x weekly - 1 sets - 3 reps - 30 sec hold  GOALS: Goals reviewed with patient? Yes  SHORT TERM GOALS: Target date: 07/29/2022  Pt will be independent with HEP for improved strength, flexibility, balance, gait. Baseline:  Goal status: IN  PROGRESS  2.  Pt will improve 5x sit<>stand to less than or equal to 18 sec to demonstrate improved functional strength and transfer efficiency. Baseline: 22.53 sec Goal status: IN PROGRESS  LONG TERM GOALS: Target date: 08/12/2022  Pt will be independent with HEP for improved strength, flexibility, balance, gait. Baseline:  Goal status: IN PROGRESS  2.  Pt will improve 5x sit<>stand to less than or equal to 12 sec to demonstrate improved functional strength and transfer efficiency. Baseline: 22.53 sec Goal status: IN PROGRESS  3.  Pt will improve TUG score to less than or equal to 15 sec for decreased fall risk. Baseline: 19.94 sec Goal status: IN PROGRESS  4.  Pt will improve gait velocity to at least 2 ft/sec for improved gait efficiency and safety. Baseline: 1.7 ft/sec Goal status: IN PROGRESS  5.  Pt will improve 2 MWT to at least 250 ft for improved gait efficiency and endurance for gait. Baseline: 200 ft Goal status: IN PROGRESS  ASSESSMENT:  CLINICAL IMPRESSION: Skilled PT session today continued to focus on strength for RLE and standing NMR for RLE.  Pt continues to demonstrate R knee flexion throughout gait cycle, not performing terminal knee extension in stance with gait.  He demonstrates weakness in hamstrings with inability to perform seated hamstring curl against resistance of band.   He has just gotten weights for home and will be performing seated exercises with weights.  He will continue to benefit from further skilled PT to work on improved weightbearing, NMR, strength to RLE for improved gait mechanics and functional mobility.    OBJECTIVE IMPAIRMENTS: Abnormal gait, decreased balance, decreased knowledge of use of DME, decreased mobility, difficulty walking, decreased ROM, decreased strength, impaired flexibility, impaired tone, and postural dysfunction.   ACTIVITY LIMITATIONS: carrying, standing, squatting, stairs, transfers, and locomotion level  PARTICIPATION  LIMITATIONS: shopping, community activity, and yard work  PERSONAL FACTORS: 1-2 comorbidities: see above  are also affecting patient's functional outcome.   REHAB POTENTIAL: Good  CLINICAL DECISION MAKING: Evolving/moderate complexity  EVALUATION COMPLEXITY: Moderate  PLAN:  PT FREQUENCY: 2x/week  PT DURATION: 6 weeks plus eval session, = 7 weeks total POC  PLANNED INTERVENTIONS: Therapeutic exercises, Therapeutic activity, Neuromuscular re-education, Balance training, Gait training, Patient/Family education, Self Care, Stair training, Orthotic/Fit training, DME instructions, and Manual therapy  PLAN FOR NEXT SESSION: Check STGs; ask how strength HEP is going; Gait training, NMR in standing for RLE, continue to progress stretching (try hip flexor stretch, hamstrings, gastrocs);   Gait with cane, walking pole for optimal gait mechanics/pattern.  Consider asking about options for RLE tone management   Taiyo Kozma W., PT 07/22/2022, 10:18 AM  [place] [facility] at  [facility] [address] [place], [place] [address] Phone # [phone] Fax # [phone]

## 2022-07-26 ENCOUNTER — Encounter: Payer: Self-pay | Admitting: Physical Therapy

## 2022-07-26 ENCOUNTER — Ambulatory Visit: Payer: Medicare Other | Admitting: Physical Therapy

## 2022-07-26 DIAGNOSIS — R2689 Other abnormalities of gait and mobility: Secondary | ICD-10-CM | POA: Diagnosis not present

## 2022-07-26 DIAGNOSIS — R2681 Unsteadiness on feet: Secondary | ICD-10-CM | POA: Diagnosis not present

## 2022-07-26 DIAGNOSIS — M6281 Muscle weakness (generalized): Secondary | ICD-10-CM | POA: Diagnosis not present

## 2022-07-26 NOTE — Patient Instructions (Signed)
  [facility]  [employer]  [employer]   Look at [employer] for recumbent bike/stationary bike

## 2022-07-26 NOTE — Therapy (Signed)
OUTPATIENT PHYSICAL THERAPY NEURO TREATMENT NOTE   Patient Name: Timothy Obrien MRN: [id number] DOB:03-Sep-1960, 62 y.o., male Today's Date: 07/26/2022   PCP: [name], MD REFERRING PROVIDER: Henreitta Leber, MD   END OF SESSION:  PT End of Session - 07/26/22 0931     Visit Number 7    Number of Visits 13    Date for PT Re-Evaluation 08/30/22    Authorization Type Medicare/[employer] Supplemental    Progress Note Due on Visit 10    PT Start Time 0934    PT Stop Time 1015    PT Time Calculation (min) 41 min    Equipment Utilized During Treatment Gait belt   would benefit from gait belt due to fall risk   Activity Tolerance Patient tolerated treatment well    Behavior During Therapy [facility] 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   Pneumonia    Seizures (HCC)    last 12/11/14   Shortness of breath dyspnea    with exertion   Tobacco abuse    Past Surgical History:  Procedure Laterality Date   APPLICATION OF CRANIAL NAVIGATION N/A 12/26/2014   Procedure: APPLICATION OF CRANIAL NAVIGATION;  Surgeon: Loura Halt Ditty, MD;  Location: [facility] NEURO ORS;  Service: Neurosurgery;  Laterality: N/A;   APPLICATION OF CRANIAL NAVIGATION Left 05/12/2017   Procedure: APPLICATION OF CRANIAL NAVIGATION;  Surgeon: Lisbeth Renshaw, MD;  Location: [facility] OR;  Service: Neurosurgery;  Laterality: Left;  APPLICATION OF CRANIAL NAVIGATION   CRANIOTOMY N/A 12/26/2014   Procedure: CRANIOTOMY TUMOR EXCISION with BrainLab;  Surgeon: Loura Halt Ditty, MD;  Location: [facility] NEURO ORS;  Service: Neurosurgery;  Laterality: N/A;  CRANIOTOMY TUMOR EXCISION with Stealth   CRANIOTOMY Left 05/12/2017   Procedure: STEREOTACTIC LEFT FRONTOPERIETIAL CRANIOTOMY FOR  RESECTION OF TUMOR;  Surgeon: Lisbeth Renshaw, MD;  Location: [facility] OR;  Service: Neurosurgery;  Laterality: Left;  STEREOTACTIC LEFT FRONTOPERIETIAL CRANIOTOMY FOR RESECTION OF TUMOR   HERNIA REPAIR     VASECTOMY     VIDEO ASSISTED THORACOSCOPY (VATS)/WEDGE RESECTION Left 01/14/2015   Procedure: VIDEO ASSISTED THORACOSCOPY (VATS)/WEDGE RESECTION, Superior segmentectomy left  lower lobe, wedge resection of left upper lobe, multiple lymph node disection, On Q insertion.;  Surgeon: Delight Ovens, MD;  Location: [facility] OR;  Service: Thoracic;  Laterality: Left;   VIDEO BRONCHOSCOPY Bilateral 12/16/2014   Procedure: VIDEO BRONCHOSCOPY WITH FLUORO;  Surgeon: Leslye Peer, MD;  Location: [facility] ENDOSCOPY;  Service: Cardiopulmonary;  Laterality: Bilateral;   VIDEO BRONCHOSCOPY N/A 01/14/2015   Procedure: VIDEO BRONCHOSCOPY;  Surgeon: Delight Ovens, MD;  Location: [facility] OR;  Service: Thoracic;  Laterality: N/A;   Patient Active Problem List   Diagnosis Date Noted   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   Brain tumor (HCC) 05/12/2017   Right leg weakness 01/12/2017   Depression 02/11/2016   Hypertension 12/31/2015   Chronic fatigue 09/17/2015   Shoulder pain, left 08/06/2015   Encounter for antineoplastic immunotherapy 03/12/2015   Malnutrition of moderate degree 01/17/2015   S/P craniotomy 12/26/2014   Malignant neoplasm metastatic to brain [facility]) 12/26/2014   Primary cancer of left lower lobe  of lung (HCC) 12/22/2014   Right sided weakness 12/12/2014   Tobacco abuse     ONSET DATE: 06/21/2022 (MD referral)  REFERRING DIAG:  C79.31 (ICD-10-CM) - Malignant neoplasm metastatic to brain (HCC)  R53.1 (ICD-10-CM) - Right sided weakness    THERAPY DIAG:  Muscle weakness (generalized)  Unsteadiness on feet  Other abnormalities of gait and mobility  Rationale for Evaluation and Treatment: Rehabilitation  SUBJECTIVE:                                                                                                                                                                                              SUBJECTIVE STATEMENT: Went for an hour walk with my neighbor last week.  That gave me good encouragement that I can do more things.  Used the walking pole    Pt accompanied by: self  PERTINENT HISTORY: lung cancer with brain metastasis s/p crainiotomy 2016; radiation   PAIN:  Are you having pain? No  PRECAUTIONS: Fall  WEIGHT BEARING RESTRICTIONS: No  FALLS: Has patient fallen in last 6 months? No Hx of falls in the past; has had rib fractures LIVING ENVIRONMENT: Lives with: lives alone Lives in: House/apartment Stairs: Yes: Internal: 12 steps; does not go up the steps and External: 1 steps; none Has following equipment at home: Single point cane and walking poles Has foot-up brace, does not use  PLOF: Independent and Leisure: reports not doing as much due to decreased confidence  PATIENT GOALS: Improve R leg strength, balance, improved walking  OBJECTIVE:    TODAY'S TREATMENT: 07/26/2022 Activity Comments  5x sit<>stand from 20":  22.69 sec From 18" :24.62 sec, 21.38 sec Good equal, even weightshifting  Seated march, 3#  x 10 Seated LAQ 3# x 10 Good return demo  Reviewed seated hamstring and standing gastroc stretch Good return demo  From runner's stretch position-quick R hip/knee flexion 2 x 5 reps For improved timing/coordination  Sidestepping at counter, 3 reps; 2 additional reps RLE 3# Cues for R foot clearance  Forward/back walking 5 reps with 3# at RLE   Forward/back march, 3 reps, RLE 3#   Seated SciFit bike, BLEs only, x 8 minutes (2 min back, 6 min forward), Level 1.5>2        Access Code: [email or web address] URL: [email or web address] Date: 07/19/2022 Prepared by: [facility] - Outpatient  Rehab - [facility]  Exercises - Seated Hamstring stretch  - 2 x Timothy - 7 x weekly - 1 sets - 3 reps - 30 sec hold - Standing Gastroc  Stretch at Counter  - 2 x Timothy - 7 x weekly - 1  sets - 3 reps - 30 sec hold - Hip Flexion  - 1 x Timothy - 5 x weekly - 2 sets - 10 reps - Seated Table Hamstring Stretch  - 1-2 x Timothy - 7 x weekly - 1 sets - 3 reps - 30 sec hold - Sit to Stand  - 1-2 x Timothy - 7 x weekly - 2 sets - 5 reps - 3 sec hold - Seated Long Arc Quad with Ankle Weight  - 1 x Timothy - 5 x weekly - 3 sets - 10 reps - Seated Hip Flexion March with Ankle Weights  - 1 x Timothy - 5 x weekly - 3 sets - 10 reps  PATIENT EDUCATION: Education details: Options for gym use for aerobic machines versus stationary bike for home Person educated: Patient Education method: Programmer, multimedia, Demonstration, Verbal cues, and Handouts Education comprehension: verbalized understanding, returned demonstration, and needs further education  -------------------------------------------------- Objective measures below taken at initial evaluation: DIAGNOSTIC FINDINGS: NA   COGNITION: Overall cognitive status: Within functional limits for tasks assessed   SENSATION: Light touch: WFL  MUSCLE TONE: RLE: Moderate and clonus   LOWER EXTREMITY ROM:     Active  Right Eval Left Eval  Hip flexion    Hip extension    Hip abduction    Hip adduction    Hip internal rotation    Hip external rotation    Knee flexion    Knee extension -8   Ankle dorsiflexion Neutral (passive +5)   Ankle plantarflexion    Ankle inversion    Ankle eversion     (Blank rows = not tested)  LOWER EXTREMITY MMT:    MMT Right Eval Left Eval  Hip flexion 3+ 4  Hip extension    Hip abduction    Hip adduction    Hip internal rotation    Hip external rotation    Knee flexion 3+ 4  Knee extension 4 4+  Ankle dorsiflexion 3-   Ankle plantarflexion 3-   Ankle inversion    Ankle eversion    (Blank rows = not tested)   TRANSFERS: Assistive device utilized: None  Sit to stand: Modified independence and increased weightbearing through LLE Stand to sit: Modified  independence and increased weightbearing through LLE  STAIRS: Level of Assistance: CGA Stair Negotiation Technique: Step to Pattern with Single Rail on Left Number of Stairs: 2-3  Height of Stairs: 4-6"  Comments: reports using step to pattern at home; descends with LLE leading, decreased R foot clearance  GAIT: Gait pattern: step to pattern, step through pattern, decreased arm swing- Right, decreased step length- Right, decreased stance time- Right, decreased ankle dorsiflexion- Right, circumduction- Right, Right hip hike, decreased trunk rotation, and poor foot clearance- Right Distance walked: 50 ft Assistive device utilized: None Level of assistance: SBA and CGA   FUNCTIONAL TESTS:  5 times sit to stand: 22.53 sec Timed up and go (TUG): 19.94 sec 2 minute walk test: 200 ft 10 meter walk test: 19.34sec  (1.7 ft/sec)    TODAY'S TREATMENT:  DATE: 06/28/2022    PATIENT EDUCATION: Education details: PT eval results, POC, initial HEP Person educated: Patient Education method: Explanation, Demonstration, and Handouts Education comprehension: verbalized understanding, returned demonstration, and needs further education  HOME EXERCISE PROGRAM: Access Code: [email or web address] URL: [email or web address] Date: 06/28/2022 Prepared by: [facility] - Outpatient  Rehab - [facility]  Exercises - Seated Hamstring stretch  - 2 x Timothy - 7 x weekly - 1 sets - 3 reps - 30 sec hold - Standing Gastroc Stretch at Counter  - 2 x Timothy - 7 x weekly - 1 sets - 3 reps - 30 sec hold  GOALS: Goals reviewed with patient? Yes  SHORT TERM GOALS: Target date: 07/29/2022  Pt will be independent with HEP for improved strength, flexibility, balance, gait. Baseline:  Goal status: MET 07/26/2022  2.  Pt will improve 5x sit<>stand to less than or equal to 18 sec to  demonstrate improved functional strength and transfer efficiency. Baseline: 22.53 sec Goal status: IN PROGRESS  LONG TERM GOALS: Target date: 08/12/2022  Pt will be independent with HEP for improved strength, flexibility, balance, gait. Baseline:  Goal status: IN PROGRESS  2.  Pt will improve 5x sit<>stand to less than or equal to 12 sec to demonstrate improved functional strength and transfer efficiency. Baseline: 22.53 sec Goal status: IN PROGRESS  3.  Pt will improve TUG score to less than or equal to 15 sec for decreased fall risk. Baseline: 19.94 sec Goal status: IN PROGRESS  4.  Pt will improve gait velocity to at least 2 ft/sec for improved gait efficiency and safety. Baseline: 1.7 ft/sec Goal status: IN PROGRESS  5.  Pt will improve 2 MWT to at least 250 ft for improved gait efficiency and endurance for gait. Baseline: 200 ft Goal status: IN PROGRESS  ASSESSMENT:  CLINICAL IMPRESSION: Worked on strengthening and review of HEP today.  Pt has met STG 1 for HEP.  Worked on sit<>stand today, with improved RLE weightbearing, though he does need to take some extra time to reposition feet for optimal technique.  Pt used 3# weight on RLE with standing dynamic exercises at counter, with better foot placement RLE with use of weight, verus no weight.  Also used recumbent bike at end of session and he was able to perform x 8 minutes without difficulty for full ROM of R knee and hip.  He will continue to benefit from further skilled PT towards improved gait mechanics and functional mobility.    OBJECTIVE IMPAIRMENTS: Abnormal gait, decreased balance, decreased knowledge of use of DME, decreased mobility, difficulty walking, decreased ROM, decreased strength, impaired flexibility, impaired tone, and postural dysfunction.   ACTIVITY LIMITATIONS: carrying, standing, squatting, stairs, transfers, and locomotion level  PARTICIPATION LIMITATIONS: shopping, community activity, and yard  work  PERSONAL FACTORS: 1-2 comorbidities: see above  are also affecting patient's functional outcome.   REHAB POTENTIAL: Good  CLINICAL DECISION MAKING: Evolving/moderate complexity  EVALUATION COMPLEXITY: Moderate  PLAN:  PT FREQUENCY: 2x/week  PT DURATION: 6 weeks plus eval session, = 7 weeks total POC  PLANNED INTERVENTIONS: Therapeutic exercises, Therapeutic activity, Neuromuscular re-education, Balance training, Gait training, Patient/Family education, Self Care, Stair training, Orthotic/Fit training, DME instructions, and Manual therapy  PLAN FOR NEXT SESSION: Check remaining STG; ask how strength HEP is going; Gait training, NMR in standing for RLE, continue to progress stretching (try hip flexor stretch, hamstrings, gastrocs);   Gait with cane, walking pole for optimal gait mechanics/pattern.  Consider asking about options for RLE tone  management   Gean Maidens., PT 07/26/2022, 3:07 PM  [facility] at [facility] [address] [place], [address] [place], [place] [address] Phone # [phone] Fax # [phone]

## 2022-07-29 ENCOUNTER — Encounter: Payer: Self-pay | Admitting: Physical Therapy

## 2022-07-29 ENCOUNTER — Ambulatory Visit: Payer: Medicare Other | Admitting: Physical Therapy

## 2022-07-29 DIAGNOSIS — R2681 Unsteadiness on feet: Secondary | ICD-10-CM | POA: Diagnosis not present

## 2022-07-29 DIAGNOSIS — R2689 Other abnormalities of gait and mobility: Secondary | ICD-10-CM | POA: Diagnosis not present

## 2022-07-29 DIAGNOSIS — M6281 Muscle weakness (generalized): Secondary | ICD-10-CM | POA: Diagnosis not present

## 2022-07-29 NOTE — Therapy (Signed)
OUTPATIENT PHYSICAL THERAPY NEURO TREATMENT NOTE   Patient Name: Timothy Obrien MRN: [id number] DOB:06-11-1960, 62 y.o., male Today's Date: 07/29/2022   PCP: [name], MD REFERRING PROVIDER: Henreitta Leber, MD   END OF SESSION:  PT End of Session - 07/29/22 0929     Visit Number 8    Number of Visits 13    Date for PT Re-Evaluation 08/30/22    Authorization Type Medicare/[employer] Supplemental    Progress Note Due on Visit 10    PT Start Time 0932    PT Stop Time 1013    PT Time Calculation (min) 41 min    Equipment Utilized During Treatment Gait belt   would benefit from gait belt due to fall risk   Activity Tolerance Patient tolerated treatment well    Behavior During Therapy [facility] for tasks assessed/performed                 Past Medical History:  Diagnosis Date   Anxiety    Bronchitis    Chronic fatigue 09/17/2015   Depression 02/11/2016   Hypertension 12/31/2015   states never been told   Lung cancer (HCC) dx'd 2016   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   Pneumonia    Seizures (HCC)    last 12/11/14   Shortness of breath dyspnea    with exertion   Tobacco abuse    Past Surgical History:  Procedure Laterality Date   APPLICATION OF CRANIAL NAVIGATION N/A 12/26/2014   Procedure: APPLICATION OF CRANIAL NAVIGATION;  Surgeon: Loura Halt Ditty, MD;  Location: [facility] NEURO ORS;  Service: Neurosurgery;  Laterality: N/A;   APPLICATION OF CRANIAL NAVIGATION Left 05/12/2017   Procedure: APPLICATION OF CRANIAL NAVIGATION;  Surgeon: Lisbeth Renshaw, MD;  Location: [facility] OR;  Service: Neurosurgery;  Laterality: Left;  APPLICATION OF CRANIAL NAVIGATION   CRANIOTOMY N/A 12/26/2014   Procedure: CRANIOTOMY TUMOR EXCISION with BrainLab;  Surgeon: Loura Halt Ditty, MD;  Location: [facility] NEURO OR[facility];  Service: Neurosurgery;  Laterality: N/A;  CRANIOTOMY TUMOR EXCISION with Stealth   CRANIOTOMY Left 05/12/2017   Procedure: STEREOTACTIC LEFT FRONTOPERIETIAL CRANIOTOMY FOR  RESECTION OF TUMOR;  Surgeon: Lisbeth Renshaw, MD;  Location: [facility] OR;  Service: Neurosurgery;  Laterality: Left;  STEREOTACTIC LEFT FRONTOPERIETIAL CRANIOTOMY FOR RESECTION OF TUMOR   HERNIA REPAIR     VASECTOMY     VIDEO ASSISTED THORACOSCOPY (VATS)/WEDGE RESECTION Left 01/14/2015   Procedure: VIDEO ASSISTED THORACOSCOPY (VATS)/WEDGE RESECTION, Superior segmentectomy left  lower lobe, wedge resection of left upper lobe, multiple lymph node disection, On Q insertion.;  Surgeon: Delight Ovens, MD;  Location: [facility] OR;  Service: Thoracic;  Laterality: Left;   VIDEO BRONCHOSCOPY Bilateral 12/16/2014   Procedure: VIDEO BRONCHOSCOPY WITH FLUORO;  Surgeon: Leslye Peer, MD;  Location: [facility] ENDOSCOPY;  Service: Cardiopulmonary;  Laterality: Bilateral;   VIDEO BRONCHOSCOPY N/A 01/14/2015   Procedure: VIDEO BRONCHOSCOPY;  Surgeon: Delight Ovens, MD;  Location: [facility] OR;  Service: Thoracic;  Laterality: N/A;   Patient Active Problem List   Diagnosis Date Noted   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   Brain tumor (HCC) 05/12/2017   Right leg weakness 01/12/2017   Depression 02/11/2016   Hypertension 12/31/2015   Chronic fatigue 09/17/2015   Shoulder pain, left 08/06/2015   Encounter for antineoplastic immunotherapy 03/12/2015   Malnutrition of moderate degree 01/17/2015   S/P craniotomy 12/26/2014   Malignant neoplasm metastatic to brain [facility]) 12/26/2014   Primary cancer of left lower  lobe of lung (HCC) 12/22/2014   Right sided weakness 12/12/2014   Tobacco abuse     ONSET DATE: 06/21/2022 (MD referral)  REFERRING DIAG:  C79.31 (ICD-10-CM) - Malignant neoplasm metastatic to brain (HCC)  R53.1 (ICD-10-CM) - Right sided weakness    THERAPY DIAG:  Muscle weakness (generalized)  Unsteadiness on feet  Other abnormalities of gait and mobility  Rationale for Evaluation and Treatment: Rehabilitation  SUBJECTIVE:                                                                                                                                                                                              SUBJECTIVE STATEMENT: Looked online for options for bike and haven't yet looked into [facility].   Pt accompanied by: self  PERTINENT HISTORY: lung cancer with brain metastasis s/p crainiotomy 2016; radiation   PAIN:  Are you having pain? No  PRECAUTIONS: Fall  WEIGHT BEARING RESTRICTIONS: No  FALLS: Has patient fallen in last 6 months? No Hx of falls in the past; has had rib fractures LIVING ENVIRONMENT: Lives with: lives alone Lives in: House/apartment Stairs: Yes: Internal: 12 steps; does not go up the steps and External: 1 steps; none Has following equipment at home: Single point cane and walking poles Has foot-up brace, does not use  PLOF: Independent and Leisure: reports not doing as much due to decreased confidence  PATIENT GOALS: Improve R leg strength, balance, improved walking  OBJECTIVE:    TODAY'S TREATMENT: 07/29/2022 Activity Comments  Seated SciFit, Level 2, BLEs x 3 minutes Cues to attend to   5x sit<>stand 22.72 sec arms crossed at chest, then 18.94 sec   Minisquat to stand, holding 4.4 weighted ball, 2 x 10 reps   Suitcase carry, 20 ft x 6 reps 5# weight, min guard  Marching in place 3# 2 x 10   Standing hamstring curls 3#, 10 reps with assistance   Sidestepping at parallel bars 5 reps, 3#   Forward step over obstacle, 3# BLE x 10 reps, the L foot step over, focus on R hip/knee flexion x 10    Access Code: [email or web address] URL: [email or web address] Date: 07/29/2022 Prepared by: [facility] - Outpatient  Rehab - [facility]  Exercises - Seated Hamstring stretch  - 2 x daily - 7 x weekly - 1 sets - 3 reps - 30 sec hold - Standing Gastroc Stretch at Counter  - 2 x daily - 7 x weekly - 1 sets - 3 reps - 30 sec hold - Hip Flexion  - 1 x daily - 5 x weekly - 2 sets - 10 reps - Seated  Table Hamstring Stretch  - 1-2 x daily - 7 x  weekly - 1 sets - 3 reps - 30 sec hold - Sit to Stand  - 1-2 x daily - 7 x weekly - 2 sets - 5 reps - 3 sec hold - Seated Long Arc Quad with Ankle Weight  - 1 x daily - 5 x weekly - 3 sets - 10 reps - Seated Hip Flexion March with Ankle Weights  - 1 x daily - 5 x weekly - 3 sets - 10 reps - Standing Marching  - 1 x daily - 5 x weekly - 2 sets - 10 reps - Side Stepping with Counter Support  - 1 x daily - 5 x weekly - 1 sets - 3-5 reps   PATIENT EDUCATION: Education details: Continued discussion on options for gym use for aerobic machines versus stationary bike for home, HEP additions Person educated: Patient Education method: Programmer, multimedia, Demonstration, Verbal cues, and Handouts Education comprehension: verbalized understanding, returned demonstration, and needs further education  -------------------------------------------------- Objective measures below taken at initial evaluation: DIAGNOSTIC FINDINGS: NA   COGNITION: Overall cognitive status: Within functional limits for tasks assessed   SENSATION: Light touch: WFL  MUSCLE TONE: RLE: Moderate and clonus   LOWER EXTREMITY ROM:     Active  Right Eval Left Eval  Hip flexion    Hip extension    Hip abduction    Hip adduction    Hip internal rotation    Hip external rotation    Knee flexion    Knee extension -8   Ankle dorsiflexion Neutral (passive +5)   Ankle plantarflexion    Ankle inversion    Ankle eversion     (Blank rows = not tested)  LOWER EXTREMITY MMT:    MMT Right Eval Left Eval  Hip flexion 3+ 4  Hip extension    Hip abduction    Hip adduction    Hip internal rotation    Hip external rotation    Knee flexion 3+ 4  Knee extension 4 4+  Ankle dorsiflexion 3-   Ankle plantarflexion 3-   Ankle inversion    Ankle eversion    (Blank rows = not tested)   TRANSFERS: Assistive device utilized: None  Sit to stand: Modified independence and increased weightbearing through LLE Stand to sit: Modified  independence and increased weightbearing through LLE  STAIRS: Level of Assistance: CGA Stair Negotiation Technique: Step to Pattern with Single Rail on Left Number of Stairs: 2-3  Height of Stairs: 4-6"  Comments: reports using step to pattern at home; descends with LLE leading, decreased R foot clearance  GAIT: Gait pattern: step to pattern, step through pattern, decreased arm swing- Right, decreased step length- Right, decreased stance time- Right, decreased ankle dorsiflexion- Right, circumduction- Right, Right hip hike, decreased trunk rotation, and poor foot clearance- Right Distance walked: 50 ft Assistive device utilized: None Level of assistance: SBA and CGA   FUNCTIONAL TESTS:  5 times sit to stand: 22.53 sec Timed up and go (TUG): 19.94 sec 2 minute walk test: 200 ft 10 meter walk test: 19.34sec  (1.7 ft/sec)    TODAY'S TREATMENT:  DATE: 06/28/2022    PATIENT EDUCATION: Education details: PT eval results, POC, initial HEP Person educated: Patient Education method: Explanation, Demonstration, and Handouts Education comprehension: verbalized understanding, returned demonstration, and needs further education  HOME EXERCISE PROGRAM: Access Code: [email or web address] URL: [email or web address] Date: 06/28/2022 Prepared by: [facility] - Outpatient  Rehab - [facility]  Exercises - Seated Hamstring stretch  - 2 x daily - 7 x weekly - 1 sets - 3 reps - 30 sec hold - Standing Gastroc Stretch at Counter  - 2 x daily - 7 x weekly - 1 sets - 3 reps - 30 sec hold  GOALS: Goals reviewed with patient? Yes  SHORT TERM GOALS: Target date: 07/29/2022  Pt will be independent with HEP for improved strength, flexibility, balance, gait. Baseline:  Goal status: MET 07/26/2022  2.  Pt will improve 5x sit<>stand to less than or equal to 18 sec to  demonstrate improved functional strength and transfer efficiency. Baseline: 22.53 sec>18.94 sec 07/29/2022 Goal status: PARTIALLY MET  LONG TERM GOALS: Target date: 08/12/2022  Pt will be independent with HEP for improved strength, flexibility, balance, gait. Baseline:  Goal status: IN PROGRESS  2.  Pt will improve 5x sit<>stand to less than or equal to 12 sec to demonstrate improved functional strength and transfer efficiency. Baseline: 22.53 sec Goal status: IN PROGRESS  3.  Pt will improve TUG score to less than or equal to 15 sec for decreased fall risk. Baseline: 19.94 sec Goal status: IN PROGRESS  4.  Pt will improve gait velocity to at least 2 ft/sec for improved gait efficiency and safety. Baseline: 1.7 ft/sec Goal status: IN PROGRESS  5.  Pt will improve 2 MWT to at least 250 ft for improved gait efficiency and endurance for gait. Baseline: 200 ft Goal status: IN PROGRESS  ASSESSMENT:  CLINICAL IMPRESSION: Pt has partially met LTG 2, with improvements noted in form and speed with 5x sit<>stand, indicating increased functional strength.  Worked on standing strengthening exercises, with focus on timing of R hip/knee flexion.  Pt needs extra time and continues to have decreased R foot clearance.  He will continue to benefit from further skilled PT towards improved gait mechanics and functional mobility.    OBJECTIVE IMPAIRMENTS: Abnormal gait, decreased balance, decreased knowledge of use of DME, decreased mobility, difficulty walking, decreased ROM, decreased strength, impaired flexibility, impaired tone, and postural dysfunction.   ACTIVITY LIMITATIONS: carrying, standing, squatting, stairs, transfers, and locomotion level  PARTICIPATION LIMITATIONS: shopping, community activity, and yard work  PERSONAL FACTORS: 1-2 comorbidities: see above  are also affecting patient's functional outcome.   REHAB POTENTIAL: Good  CLINICAL DECISION MAKING: Evolving/moderate  complexity  EVALUATION COMPLEXITY: Moderate  PLAN:  PT FREQUENCY: 2x/week  PT DURATION: 6 weeks plus eval session, = 7 weeks total POC  PLANNED INTERVENTIONS: Therapeutic exercises, Therapeutic activity, Neuromuscular re-education, Balance training, Gait training, Patient/Family education, Self Care, Stair training, Orthotic/Fit training, DME instructions, and Manual therapy  PLAN FOR NEXT SESSION:  ask how additions to strength HEP are going; Gait training, NMR in standing for RLE, continue to progress stretching (try hip flexor stretch, hamstrings, gastrocs);   Gait with cane, walking pole for optimal gait mechanics/pattern.  Consider asking about options for RLE tone management   Skyelyn Scruggs W., PT 07/29/2022, 10:17 AM  [facility] Re[facility] at [facility] [address] [place], [address] [place], [place] [address] Phone # [phone] Fax # [phone]

## 2022-08-02 ENCOUNTER — Encounter: Payer: Self-pay | Admitting: Physical Therapy

## 2022-08-02 ENCOUNTER — Ambulatory Visit: Payer: Medicare Other | Admitting: Physical Therapy

## 2022-08-02 DIAGNOSIS — M6281 Muscle weakness (generalized): Secondary | ICD-10-CM | POA: Diagnosis not present

## 2022-08-02 DIAGNOSIS — R2681 Unsteadiness on feet: Secondary | ICD-10-CM

## 2022-08-02 DIAGNOSIS — R2689 Other abnormalities of gait and mobility: Secondary | ICD-10-CM | POA: Diagnosis not present

## 2022-08-02 NOTE — Therapy (Signed)
OUTPATIENT PHYSICAL THERAPY NEURO TREATMENT NOTE   Patient Name: Timothy Obrien MRN: [id number] DOB:November 14, 1960, 62 y.o., male Today's Date: 08/02/2022   PCP: [name], MD REFERRING PROVIDER: Henreitta Leber, MD   END OF SESSION:  PT End of Session - 08/02/22 0933     Visit Number 9    Number of Visits 13    Date for PT Re-Evaluation 08/30/22    Authorization Type Medicare/[employer] Supplemental    Progress Note Due on Visit 10    PT Start Time 0933    PT Stop Time 1015    PT Time Calculation (min) 42 min    Equipment Utilized During Treatment --   would benefit from gait belt due to fall risk   Activity Tolerance Patient tolerated treatment well    Behavior During Therapy [facility] for tasks assessed/performed                  Past Medical History:  Diagnosis Date   Anxiety    Bronchitis    Chronic fatigue 09/17/2015   Depression 02/11/2016   Hypertension 12/31/2015   states never been told   Lung cancer (HCC) dx'd 2016   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   Pneumonia    Seizures (HCC)    last 12/11/14   Shortness of breath dyspnea    with exertion   Tobacco abuse    Past Surgical History:  Procedure Laterality Date   APPLICATION OF CRANIAL NAVIGATION N/A 12/26/2014   Procedure: APPLICATION OF CRANIAL NAVIGATION;  Surgeon: Loura Halt Ditty, MD;  Location: [facility] NEURO ORS;  Service: Neurosurgery;  Laterality: N/A;   APPLICATION OF CRANIAL NAVIGATION Left 05/12/2017   Procedure: APPLICATION OF CRANIAL NAVIGATION;  Surgeon: Lisbeth Renshaw, MD;  Location: [facility] OR;  Service: Neurosurgery;  Laterality: Left;  APPLICATION OF CRANIAL NAVIGATION   CRANIOTOMY N/A 12/26/2014   Procedure: CRANIOTOMY TUMOR EXCISION with BrainLab;  Surgeon: Loura Halt Ditty, MD;  Location: [facility] NEURO ORS;  Service: Neurosurgery;  Laterality: N/A;  CRANIOTOMY TUMOR EXCISION with Stealth   CRANIOTOMY Left 05/12/2017   Procedure: STEREOTACTIC LEFT FRONTOPERIETIAL CRANIOTOMY FOR  RESECTION OF TUMOR;  Surgeon: Lisbeth Renshaw, MD;  Location: [facility] OR;  Service: Neurosurgery;  Laterality: Left;  STEREOTACTIC LEFT FRONTOPERIETIAL CRANIOTOMY FOR RESECTION OF TUMOR   HERNIA REPAIR     VASECTOMY     VIDEO ASSISTED THORACOSCOPY (VATS)/WEDGE RESECTION Left 01/14/2015   Procedure: VIDEO ASSISTED THORACOSCOPY (VATS)/WEDGE RESECTION, Superior segmentectomy left  lower lobe, wedge resection of left upper lobe, multiple lymph node disection, On Q insertion.;  Surgeon: Delight Ovens, MD;  Location: [facility] OR;  Service: Thoracic;  Laterality: Left;   VIDEO BRONCHOSCOPY Bilateral 12/16/2014   Procedure: VIDEO BRONCHOSCOPY WITH FLUORO;  Surgeon: Leslye Peer, MD;  Location: [facility] ENDOSCOPY;  Service: Cardiopulmonary;  Laterality: Bilateral;   VIDEO BRONCHOSCOPY N/A 01/14/2015   Procedure: VIDEO BRONCHOSCOPY;  Surgeon: Delight Ovens, MD;  Location: [facility] OR;  Service: Thoracic;  Laterality: N/A;   Patient Active Problem List   Diagnosis Date Noted   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   Brain tumor (HCC) 05/12/2017   Right leg weakness 01/12/2017   Depression 02/11/2016   Hypertension 12/31/2015   Chronic fatigue 09/17/2015   Shoulder pain, left 08/06/2015   Encounter for antineoplastic immunotherapy 03/12/2015   Malnutrition of moderate degree 01/17/2015   S/P craniotomy 12/26/2014   Malignant neoplasm metastatic to brain [facility]) 12/26/2014   Primary cancer of left lower  lobe of lung (HCC) 12/22/2014   Right sided weakness 12/12/2014   Tobacco abuse     ONSET DATE: 06/21/2022 (MD referral)  REFERRING DIAG:  C79.31 (ICD-10-CM) - Malignant neoplasm metastatic to brain (HCC)  R53.1 (ICD-10-CM) - Right sided weakness    THERAPY DIAG:  Muscle weakness (generalized)  Unsteadiness on feet  Other abnormalities of gait and mobility  Rationale for Evaluation and Treatment: Rehabilitation  SUBJECTIVE:                                                                                                                                                                                              SUBJECTIVE STATEMENT: Been busy with projects at home.   Cleaned out closets.  Pt accompanied by: self  PERTINENT HISTORY: lung cancer with brain metastasis s/p crainiotomy 2016; radiation   PAIN:  Are you having pain? No  PRECAUTIONS: Fall  WEIGHT BEARING RESTRICTIONS: No  FALLS: Has patient fallen in last 6 months? No Hx of falls in the past; has had rib fractures LIVING ENVIRONMENT: Lives with: lives alone Lives in: House/apartment Stairs: Yes: Internal: 12 steps; does not go up the steps and External: 1 steps; none Has following equipment at home: Single point cane and walking poles Has foot-up brace, does not use  PLOF: Independent and Leisure: reports not doing as much due to decreased confidence  PATIENT GOALS: Improve R leg strength, balance, improved walking  OBJECTIVE:    TODAY'S TREATMENT: 08/02/2022 Activity Comments  NuStep, Level 5, BLEs, x 6 minutes SPM >60-70 for intensity  Sit<>stand 2 x 10 reps, then feet on Airex x 10 reps   Minisquats 2 x 10 reps, holding weighted ball   Weight rack hamstring curls, 2 x 10, 5#, sidelying hip flexion, 10#, 1 x 10 Assist for positioning  Stagger stance forward/back rocking x 10 reps Cues for full terminal knee extension RLE         Access Code: [email or web address] URL: [email or web address] Date: 07/29/2022 Prepared by: [facility] - Outpatient  Rehab - [facility]  Exercises - Seated Hamstring stretch  - 2 x daily - 7 x weekly - 1 sets - 3 reps - 30 sec hold - Standing Gastroc Stretch at Counter  - 2 x daily - 7 x weekly - 1 sets - 3 reps - 30 sec hold - Hip Flexion  - 1 x daily - 5 x weekly - 2 sets - 10 reps - Seated Table Hamstring Stretch  - 1-2 x daily - 7 x weekly - 1 sets - 3 reps - 30 sec hold - Sit to Stand  - 1-2  x daily - 7 x weekly - 2 sets - 5 reps - 3 sec hold - Seated Long  Arc Quad with Ankle Weight  - 1 x daily - 5 x weekly - 3 sets - 10 reps - Seated Hip Flexion March with Ankle Weights  - 1 x daily - 5 x weekly - 3 sets - 10 reps - Standing Marching  - 1 x daily - 5 x weekly - 2 sets - 10 reps - Side Stepping with Counter Support  - 1 x daily - 5 x weekly - 1 sets - 3-5 reps   PATIENT EDUCATION: Education details: Discussed options for potential of AFO trials and possible discussion about spasticity management in RLE Person educated: Patient Education method: Programmer, multimedia, Demonstration, Verbal cues, and Handouts Education comprehension: verbalized understanding, returned demonstration, and needs further education  -------------------------------------------------- Objective measures below taken at initial evaluation: DIAGNOSTIC FINDINGS: NA   COGNITION: Overall cognitive status: Within functional limits for tasks assessed   SENSATION: Light touch: WFL  MUSCLE TONE: RLE: Moderate and clonus   LOWER EXTREMITY ROM:     Active  Right Eval Left Eval  Hip flexion    Hip extension    Hip abduction    Hip adduction    Hip internal rotation    Hip external rotation    Knee flexion    Knee extension -8   Ankle dorsiflexion Neutral (passive +5)   Ankle plantarflexion    Ankle inversion    Ankle eversion     (Blank rows = not tested)  LOWER EXTREMITY MMT:    MMT Right Eval Left Eval  Hip flexion 3+ 4  Hip extension    Hip abduction    Hip adduction    Hip internal rotation    Hip external rotation    Knee flexion 3+ 4  Knee extension 4 4+  Ankle dorsiflexion 3-   Ankle plantarflexion 3-   Ankle inversion    Ankle eversion    (Blank rows = not tested)   TRANSFERS: Assistive device utilized: None  Sit to stand: Modified independence and increased weightbearing through LLE Stand to sit: Modified independence and increased weightbearing through LLE  STAIRS: Level of Assistance: CGA Stair Negotiation Technique: Step to Pattern  with Single Rail on Left Number of Stairs: 2-3  Height of Stairs: 4-6"  Comments: reports using step to pattern at home; descends with LLE leading, decreased R foot clearance  GAIT: Gait pattern: step to pattern, step through pattern, decreased arm swing- Right, decreased step length- Right, decreased stance time- Right, decreased ankle dorsiflexion- Right, circumduction- Right, Right hip hike, decreased trunk rotation, and poor foot clearance- Right Distance walked: 50 ft Assistive device utilized: None Level of assistance: SBA and CGA   FUNCTIONAL TESTS:  5 times sit to stand: 22.53 sec Timed up and go (TUG): 19.94 sec 2 minute walk test: 200 ft 10 meter walk test: 19.34sec  (1.7 ft/sec)    TODAY'S TREATMENT:  DATE: 06/28/2022    PATIENT EDUCATION: Education details: PT eval results, POC, initial HEP Person educated: Patient Education method: Explanation, Demonstration, and Handouts Education comprehension: verbalized understanding, returned demonstration, and needs further education  HOME EXERCISE PROGRAM: Access Code: [email or web address] URL: [email or web address] Date: 06/28/2022 Prepared by: [facility] - Outpatient  Rehab - [facility]  Exercises - Seated Hamstring stretch  - 2 x daily - 7 x weekly - 1 sets - 3 reps - 30 sec hold - Standing Gastroc Stretch at Counter  - 2 x daily - 7 x weekly - 1 sets - 3 reps - 30 sec hold  GOALS: Goals reviewed with patient? Yes  SHORT TERM GOALS: Target date: 07/29/2022  Pt will be independent with HEP for improved strength, flexibility, balance, gait. Baseline:  Goal status: MET 07/26/2022  2.  Pt will improve 5x sit<>stand to less than or equal to 18 sec to demonstrate improved functional strength and transfer efficiency. Baseline: 22.53 sec>18.94 sec 07/29/2022 Goal status: PARTIALLY MET  LONG  TERM GOALS: Target date: 08/12/2022  Pt will be independent with HEP for improved strength, flexibility, balance, gait. Baseline:  Goal status: IN PROGRESS  2.  Pt will improve 5x sit<>stand to less than or equal to 12 sec to demonstrate improved functional strength and transfer efficiency. Baseline: 22.53 sec Goal status: IN PROGRESS  3.  Pt will improve TUG score to less than or equal to 15 sec for decreased fall risk. Baseline: 19.94 sec Goal status: IN PROGRESS  4.  Pt will improve gait velocity to at least 2 ft/sec for improved gait efficiency and safety. Baseline: 1.7 ft/sec Goal status: IN PROGRESS  5.  Pt will improve 2 MWT to at least 250 ft for improved gait efficiency and endurance for gait. Baseline: 200 ft Goal status: IN PROGRESS  ASSESSMENT:  CLINICAL IMPRESSION: Skilled PT session today continued to focus on RLE for improved RLE strength and NMR.  Pt continues to have decreased R foot clearance with gait, but with carrying weight with suitcase carry, he does demo improved timing and coordination with gait, with slowed pace.  Utilized weight rack for hamstring strength and hip flexion strengthening, with pt fatigueing quickly.  Discussed options for trying AFO options for better foot clearance, and pt is agreeable to trying next session.  He is also looking into bike options for home versus gym options for equipment use.  He will continue to benefit from further skilled PT towards improved gait mechanics and functional mobility.    OBJECTIVE IMPAIRMENTS: Abnormal gait, decreased balance, decreased knowledge of use of DME, decreased mobility, difficulty walking, decreased ROM, decreased strength, impaired flexibility, impaired tone, and postural dysfunction.   ACTIVITY LIMITATIONS: carrying, standing, squatting, stairs, transfers, and locomotion level  PARTICIPATION LIMITATIONS: shopping, community activity, and yard work  PERSONAL FACTORS: 1-2 comorbidities: see above   are also affecting patient's functional outcome.   REHAB POTENTIAL: Good  CLINICAL DECISION MAKING: Evolving/moderate complexity  EVALUATION COMPLEXITY: Moderate  PLAN:  PT FREQUENCY: 2x/week  PT DURATION: 6 weeks plus eval session, = 7 weeks total POC  PLANNED INTERVENTIONS: Therapeutic exercises, Therapeutic activity, Neuromuscular re-education, Balance training, Gait training, Patient/Family education, Self Care, Stair training, Orthotic/Fit training, DME instructions, and Manual therapy  PLAN FOR NEXT SESSION:  Check LTGS and discuss POC (likely recert).  Trial AFOs  Brixon Zhen W., PT 08/02/2022, 10:18 AM  [facility] at [facility] [address] [place], [address] [place], [place] [address] Phone # [phone] Fax # [phone]       

## 2022-08-05 ENCOUNTER — Encounter: Payer: Self-pay | Admitting: Physical Therapy

## 2022-08-05 ENCOUNTER — Ambulatory Visit: Payer: Medicare Other | Admitting: Physical Therapy

## 2022-08-05 DIAGNOSIS — M6281 Muscle weakness (generalized): Secondary | ICD-10-CM | POA: Diagnosis not present

## 2022-08-05 DIAGNOSIS — R2681 Unsteadiness on feet: Secondary | ICD-10-CM | POA: Diagnosis not present

## 2022-08-05 DIAGNOSIS — R2689 Other abnormalities of gait and mobility: Secondary | ICD-10-CM | POA: Diagnosis not present

## 2022-08-05 NOTE — Therapy (Signed)
OUTPATIENT PHYSICAL THERAPY NEURO TREATMENT NOTE/10th Visit PROGRESS NOTE   Patient Name: Timothy Obrien MRN: [id number] DOB:05-26-1960, 62 y.o., male Today's Date: 08/05/2022   PCP: [name], MD REFERRING PROVIDER: Henreitta Leber, MD   Progress Note Reporting Period 06/28/2022 to 08/05/2022  See note below for Objective Data and Assessment of Progress/Goals.     END OF SESSION:  PT End of Session - 08/05/22 0935     Visit Number 10    Number of Visits 13    Date for PT Re-Evaluation 08/30/22    Authorization Type Medicare/[employer] Supplemental    Progress Note Due on Visit 10    PT Start Time 0933    PT Stop Time 1018    PT Time Calculation (min) 45 min    Equipment Utilized During Treatment Gait belt   would benefit from gait belt due to fall risk   Activity Tolerance Patient tolerated treatment well    Behavior During Therapy [facility] for tasks assessed/performed                   Past Medical History:  Diagnosis Date   Anxiety    Bronchitis    Chronic fatigue 09/17/2015   Depression 02/11/2016   Hypertension 12/31/2015   states never been told   Lung cancer (HCC) dx'd 2016   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   Pneumonia    Seizures (HCC)    last 12/11/14   Shortness of breath dyspnea    with exertion   Tobacco abuse    Past Surgical History:  Procedure Laterality Date   APPLICATION OF CRANIAL NAVIGATION N/A 12/26/2014   Procedure: APPLICATION OF CRANIAL NAVIGATION;  Surgeon: Loura Halt Ditty, MD;  Location: [facility] NEURO ORS;  Service: Neurosurgery;  Laterality: N/A;   APPLICATION OF CRANIAL NAVIGATION Left 05/12/2017   Procedure: APPLICATION OF CRANIAL NAVIGATION;  Surgeon: Lisbeth Renshaw, MD;  Location: [facility] OR;  Service: Neurosurgery;  Laterality: Left;  APPLICATION OF CRANIAL NAVIGATION   CRANIOTOMY N/A 12/26/2014   Procedure: CRANIOTOMY TUMOR EXCISION with BrainLab;  Surgeon: Loura Halt Ditty, MD;  Location: [facility] NEURO OR[facility];  Service:  Neurosurgery;  Laterality: N/A;  CRANIOTOMY TUMOR EXCISION with Stealth   CRANIOTOMY Left 05/12/2017   Procedure: STEREOTACTIC LEFT FRONTOPERIETIAL CRANIOTOMY FOR RESECTION OF TUMOR;  Surgeon: Lisbeth Renshaw, MD;  Location: [facility] OR;  Service: Neurosurgery;  Laterality: Left;  STEREOTACTIC LEFT FRONTOPERIETIAL CRANIOTOMY FOR RESECTION OF TUMOR   HERNIA REPAIR     VASECTOMY     VIDEO ASSISTED THORACOSCOPY (VATS)/WEDGE RESECTION Left 01/14/2015   Procedure: VIDEO ASSISTED THORACOSCOPY (VATS)/WEDGE RESECTION, Superior segmentectomy left  lower lobe, wedge resection of left upper lobe, multiple lymph node disection, On Q insertion.;  Surgeon: Delight Ovens, MD;  Location: [facility] OR;  Service: Thoracic;  Laterality: Left;   VIDEO BRONCHOSCOPY Bilateral 12/16/2014   Procedure: VIDEO BRONCHOSCOPY WITH FLUORO;  Surgeon: Leslye Peer, MD;  Location: [facility] ENDOSCOPY;  Service: Cardiopulmonary;  Laterality: Bilateral;   VIDEO BRONCHOSCOPY N/A 01/14/2015   Procedure: VIDEO BRONCHOSCOPY;  Surgeon: Delight Ovens, MD;  Location: [facility] OR;  Service: Thoracic;  Laterality: N/A;   Patient Active Problem List   Diagnosis Date Noted   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   Brain tumor (HCC) 05/12/2017   Right leg weakness 01/12/2017   Depression 02/11/2016   Hypertension 12/31/2015   Chronic fatigue 09/17/2015   Shoulder pain, left 08/06/2015   Encounter for antineoplastic immunotherapy 03/12/2015  Malnutrition of moderate degree 01/17/2015   S/P craniotomy 12/26/2014   Malignant neoplasm metastatic to brain (HCC) 12/26/2014   Primary cancer of left lower lobe of lung (HCC) 12/22/2014   Right sided weakness 12/12/2014   Tobacco abuse     ONSET DATE: 06/21/2022 (MD referral)  REFERRING DIAG:  C79.31 (ICD-10-CM) - Malignant neoplasm metastatic to brain (HCC)  R53.1 (ICD-10-CM) - Right sided weakness    THERAPY DIAG:  Muscle weakness (generalized)  Unsteadiness on feet  Other  abnormalities of gait and mobility  Rationale for Evaluation and Treatment: Rehabilitation  SUBJECTIVE:                                                                                                                                                                                             SUBJECTIVE STATEMENT: Checked into [facility].  Haven't heard back yet.  Pt accompanied by: self  PERTINENT HISTORY: lung cancer with brain metastasis s/p crainiotomy 2016; radiation   PAIN:  Are you having pain? No  PRECAUTIONS: Fall  WEIGHT BEARING RESTRICTIONS: No  FALLS: Has patient fallen in last 6 months? No Hx of falls in the past; has had rib fractures LIVING ENVIRONMENT: Lives with: lives alone Lives in: House/apartment Stairs: Yes: Internal: 12 steps; does not go up the steps and External: 1 steps; none Has following equipment at home: Single point cane and walking poles Has foot-up brace, does not use  PLOF: Independent and Leisure: reports not doing as much due to decreased confidence  PATIENT GOALS: Improve R leg strength, balance, improved walking  OBJECTIVE:    TODAY'S TREATMENT: 08/05/2022 Activity Comments  11M walk:  18.03 sec = 1.81 ft/sec Wearing foot up brace  TUG 17.81 sec Improved from 19.9 sec  5x sit<>stand: 18.82 sec Improved from 22.53 sec     Gait with [place] AFO (lateral strut 11M walk:  17.10 sec (1.92 ft/sec) Pt feels improved R foot clearance; slightly less R hip circumduction, maintains R knee flexion in stance  Tried gait with [place] AFO (medial strut) Not comfortable and pt does not want to walk with it  Squats holding 7# weight, 2 x 10   Suitcase carry 4 x 20 ft 7#  Forward step ups, 6" step, RLE leading BUE support    Access Code: [email or web address] URL: [email or web address] Date: 08/05/2022 Prepared by: [facility] - Outpatient  Rehab - [facility]  Exercises - Seated Hamstring stretch  - 2 x daily - 7 x weekly - 1 sets - 3 reps -  30 sec hold - Standing Gastroc Stretch at Counter  - 2 x daily - 7 x weekly -  1 sets - 3 reps - 30 sec hold - Hip Flexion  - 1 x daily - 5 x weekly - 2 sets - 10 reps - Seated Table Hamstring Stretch  - 1-2 x daily - 7 x weekly - 1 sets - 3 reps - 30 sec hold - Sit to Stand  - 1-2 x daily - 7 x weekly - 2 sets - 5 reps - 3 sec hold - Seated Long Arc Quad with Ankle Weight  - 1 x daily - 5 x weekly - 3 sets - 10 reps - Seated Hip Flexion March with Ankle Weights  - 1 x daily - 5 x weekly - 3 sets - 10 reps - Standing Marching  - 1 x daily - 5 x weekly - 2 sets - 10 reps - Side Stepping with Counter Support  - 1 x daily - 5 x weekly - 1 sets - 3-5 reps - Squat  - 1 x daily - 5 x weekly - 3 sets - 10 reps - Kettlebell Suitcase Carry  - 1 x daily - 5 x weekly - 1 sets - 3-5 reps - Forward Step Up  - 1 x daily - 5 x weekly - 3 sets - 10 reps    PATIENT EDUCATION: Education details: Discussed options for AFO trials and how to obtain; discussed talking to MD about spasticity management in RLE; discussed POC and pt would like to take several weeks out of therapy to work on exercises at home, will plan to return mid-August prior to MD visit with Dr. Barbaraann Cao (will do recert then).  HEP additions Person educated: Patient Education method: Explanation, Demonstration, Verbal cues, and Handouts Education comprehension: verbalized understanding, returned demonstration, and needs further education  -------------------------------------------------- Objective measures below taken at initial evaluation: DIAGNOSTIC FINDINGS: NA   COGNITION: Overall cognitive status: Within functional limits for tasks assessed   SENSATION: Light touch: WFL  MUSCLE TONE: RLE: Moderate and clonus   LOWER EXTREMITY ROM:     Active  Right Eval Left Eval  Hip flexion    Hip extension    Hip abduction    Hip adduction    Hip internal rotation    Hip external rotation    Knee flexion    Knee extension -8   Ankle  dorsiflexion Neutral (passive +5)   Ankle plantarflexion    Ankle inversion    Ankle eversion     (Blank rows = not tested)  LOWER EXTREMITY MMT:    MMT Right Eval Left Eval  Hip flexion 3+ 4  Hip extension    Hip abduction    Hip adduction    Hip internal rotation    Hip external rotation    Knee flexion 3+ 4  Knee extension 4 4+  Ankle dorsiflexion 3-   Ankle plantarflexion 3-   Ankle inversion    Ankle eversion    (Blank rows = not tested)   TRANSFERS: Assistive device utilized: None  Sit to stand: Modified independence and increased weightbearing through LLE Stand to sit: Modified independence and increased weightbearing through LLE  STAIRS: Level of Assistance: CGA Stair Negotiation Technique: Step to Pattern with Single Rail on Left Number of Stairs: 2-3  Height of Stairs: 4-6"  Comments: reports using step to pattern at home; descends with LLE leading, decreased R foot clearance  GAIT: Gait pattern: step to pattern, step through pattern, decreased arm swing- Right, decreased step length- Right, decreased stance time- Right, decreased ankle dorsiflexion- Right, circumduction-  Right, Right hip hike, decreased trunk rotation, and poor foot clearance- Right Distance walked: 50 ft Assistive device utilized: None Level of assistance: SBA and CGA   FUNCTIONAL TESTS:  5 times sit to stand: 22.53 sec Timed up and go (TUG): 19.94 sec 2 minute walk test: 200 ft 10 meter walk test: 19.34sec  (1.7 ft/sec)    TODAY'S TREATMENT:                                                                                                                              DATE: 06/28/2022    PATIENT EDUCATION: Education details: PT eval results, POC, initial HEP Person educated: Patient Education method: Explanation, Demonstration, and Handouts Education comprehension: verbalized understanding, returned demonstration, and needs further education  HOME EXERCISE PROGRAM: Access Code:  [email or web address] URL: [email or web address] Date: 06/28/2022 Prepared by: [facility] - Outpatient  Rehab - [facility]  Exercises - Seated Hamstring stretch  - 2 x daily - 7 x weekly - 1 sets - 3 reps - 30 sec hold - Standing Gastroc Stretch at Counter  - 2 x daily - 7 x weekly - 1 sets - 3 reps - 30 sec hold  GOALS: Goals reviewed with patient? Yes  SHORT TERM GOALS: Target date: 07/29/2022  Pt will be independent with HEP for improved strength, flexibility, balance, gait. Baseline:  Goal status: MET 07/26/2022  2.  Pt will improve 5x sit<>stand to less than or equal to 18 sec to demonstrate improved functional strength and transfer efficiency. Baseline: 22.53 sec>18.94 sec 07/29/2022 Goal status: PARTIALLY MET  LONG TERM GOALS: Target date: 08/12/2022  Pt will be independent with HEP for improved strength, flexibility, balance, gait. Baseline:  Goal status: MET  2.  Pt will improve 5x sit<>stand to less than or equal to 12 sec to demonstrate improved functional strength and transfer efficiency. Baseline: 22.53 sec>18.82 sec 08/05/2022 Goal status: IN PROGRESS  3.  Pt will improve TUG score to less than or equal to 15 sec for decreased fall risk. Baseline: 19.94 sec>17.18 sec 08/05/2022 Goal status: IN PROGRESS  4.  Pt will improve gait velocity to at least 2 ft/sec for improved gait efficiency and safety. Baseline: 1.7 ft/sec>1.81 ft/sec 08/05/2022 Goal status: IN PROGRESS  5.  Pt will improve 2 MWT to at least 250 ft for improved gait efficiency and endurance for gait. Baseline: 200 ft> 210 ft 08/05/2022 Goal status: IN PROGRESS  ASSESSMENT:  CLINICAL IMPRESSION: Began assessing LTGs this visit, with pt meeting LTG for HEP (additions made today).  Pt is progressing towards LTG measures for FTSTS, gait velocity, TUG tests, just not to goal level.  He feels very motivated by progress in PT and notes that he knows he needs to keep working to get stronger.  He is  researching options for exercise equipment at home or possibly joining gym.  He remains at fall risk per objective measures; he will continue to benefit from skilled PT to  address strength, balance and gait for optimal functional mobility.  Did trial AFO today for RLE, which did demonstrate improved R foot clearance and decreased R hip circumduction with gait.  He may benefit from orthotic consult for AFO to improve gait pattern for improved gait efficiency and decreased fall risk.    OBJECTIVE IMPAIRMENTS: Abnormal gait, decreased balance, decreased knowledge of use of DME, decreased mobility, difficulty walking, decreased ROM, decreased strength, impaired flexibility, impaired tone, and postural dysfunction.   ACTIVITY LIMITATIONS: carrying, standing, squatting, stairs, transfers, and locomotion level  PARTICIPATION LIMITATIONS: shopping, community activity, and yard work  PERSONAL FACTORS: 1-2 comorbidities: see above  are also affecting patient's functional outcome.   REHAB POTENTIAL: Good  CLINICAL DECISION MAKING: Evolving/moderate complexity  EVALUATION COMPLEXITY: Moderate  PLAN:  PT FREQUENCY: 2x/week  PT DURATION: 6 weeks plus eval session, = 7 weeks total POC  PLANNED INTERVENTIONS: Therapeutic exercises, Therapeutic activity, Neuromuscular re-education, Balance training, Gait training, Patient/Family education, Self Care, Stair training, Orthotic/Fit training, DME instructions, and Manual therapy  PLAN FOR NEXT SESSION:  *Documentation for AFO. Recert next visit and update goals; check updates to HEP Kirk Basquez W., PT 08/05/2022, 11:58 AM  [facility] : [facility] at [facility] [address] [place], [address] [place], [place] [address] Phone # [phone] Fax # [phone]

## 2022-08-13 ENCOUNTER — Other Ambulatory Visit: Payer: Self-pay | Admitting: Internal Medicine

## 2022-08-25 ENCOUNTER — Encounter: Payer: Self-pay | Admitting: Physical Therapy

## 2022-08-25 ENCOUNTER — Ambulatory Visit: Payer: Medicare Other | Attending: Internal Medicine | Admitting: Physical Therapy

## 2022-08-25 DIAGNOSIS — R2681 Unsteadiness on feet: Secondary | ICD-10-CM | POA: Insufficient documentation

## 2022-08-25 DIAGNOSIS — M6281 Muscle weakness (generalized): Secondary | ICD-10-CM | POA: Diagnosis not present

## 2022-08-25 DIAGNOSIS — R2689 Other abnormalities of gait and mobility: Secondary | ICD-10-CM | POA: Diagnosis not present

## 2022-08-25 NOTE — Therapy (Signed)
OUTPATIENT PHYSICAL THERAPY NEURO TREATMENT NOTE/RECERT  Patient Name: Timothy Obrien MRN: [id number] DOB:March 10, 1960, 62 y.o., male Today's Date: 08/25/2022   PCP: [name], MD REFERRING PROVIDER: Henreitta Leber, MD      END OF SESSION:  PT End of Session - 08/25/22 0934     Visit Number 11    Number of Visits 13    Date for PT Re-Evaluation 10/21/22    Authorization Type Medicare/[employer] Supplemental    Progress Note Due on Visit 10    PT Start Time 0935    PT Stop Time 1016    PT Time Calculation (min) 41 min    Equipment Utilized During Treatment --   would benefit from gait belt due to fall risk   Activity Tolerance Patient tolerated treatment well    Behavior During Therapy [facility] for tasks assessed/performed                    Past Medical History:  Diagnosis Date   Anxiety    Bronchitis    Chronic fatigue 09/17/2015   Depression 02/11/2016   Hypertension 12/31/2015   states never been told   Lung cancer (HCC) dx'd 2016   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   Pneumonia    Seizures (HCC)    last 12/11/14   Shortness of breath dyspnea    with exertion   Tobacco abuse    Past Surgical History:  Procedure Laterality Date   APPLICATION OF CRANIAL NAVIGATION N/A 12/26/2014   Procedure: APPLICATION OF CRANIAL NAVIGATION;  Surgeon: Loura Halt Ditty, MD;  Location: [facility] NEURO ORS;  Service: Neurosurgery;  Laterality: N/A;   APPLICATION OF CRANIAL NAVIGATION Left 05/12/2017   Procedure: APPLICATION OF CRANIAL NAVIGATION;  Surgeon: Lisbeth Renshaw, MD;  Location: [facility] OR;  Service: Neurosurgery;  Laterality: Left;  APPLICATION OF CRANIAL NAVIGATION   CRANIOTOMY N/A 12/26/2014   Procedure: CRANIOTOMY TUMOR EXCISION with BrainLab;  Surgeon: Loura Halt Ditty, MD;  Location: [facility] NEURO ORS;  Service: Neurosurgery;  Laterality: N/A;  CRANIOTOMY TUMOR EXCISION with Stealth   CRANIOTOMY Left 05/12/2017   Procedure: STEREOTACTIC LEFT FRONTOPERIETIAL  CRANIOTOMY FOR RESECTION OF TUMOR;  Surgeon: Lisbeth Renshaw, MD;  Location: [facility] OR;  Service: Neurosurgery;  Laterality: Left;  STEREOTACTIC LEFT FRONTOPERIETIAL CRANIOTOMY FOR RESECTION OF TUMOR   HERNIA REPAIR     VASECTOMY     VIDEO ASSISTED THORACOSCOPY (VATS)/WEDGE RESECTION Left 01/14/2015   Procedure: VIDEO ASSISTED THORACOSCOPY (VATS)/WEDGE RESECTION, Superior segmentectomy left  lower lobe, wedge resection of left upper lobe, multiple lymph node disection, On Q insertion.;  Surgeon: Delight Ovens, MD;  Location: [facility] OR;  Service: Thoracic;  Laterality: Left;   VIDEO BRONCHOSCOPY Bilateral 12/16/2014   Procedure: VIDEO BRONCHOSCOPY WITH FLUORO;  Surgeon: Leslye Peer, MD;  Location[facility] ENDOSCOPY;  Service: Cardiopulmonary;  Laterality: Bilateral;   VIDEO BRONCHOSCOPY N/A 01/14/2015   Procedure: VIDEO BRONCHOSCOPY;  Surgeon: Delight Ovens, MD;  [facility];  Service: Thoracic;  Laterality: N/A;   Patient Active Problem List   Diagnosis Date Noted   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   Brain tumor (HCC) 05/12/2017   Right leg weakness 01/12/2017   Depression 02/11/2016   Hypertension 12/31/2015   Chronic fatigue 09/17/2015   Shoulder pain, left 08/06/2015   Encounter for antineoplastic immunotherapy 03/12/2015   Malnutrition of moderate degree 01/17/2015   S/P craniotomy 12/26/2014   Malignant neoplasm metastatic to brain [facility]) 12/26/2014   Primary  cancer of left lower lobe of lung (HCC) 12/22/2014   Right sided weakness 12/12/2014   Tobacco abuse     ONSET DATE: 06/21/2022 (MD referral)  REFERRING DIAG:  C79.31 (ICD-10-CM) - Malignant neoplasm metastatic to brain (HCC)  R53.1 (ICD-10-CM) - Right sided weakness    THERAPY DIAG:  Muscle weakness (generalized)  Unsteadiness on feet  Other abnormalities of gait and mobility  Rationale for Evaluation and Treatment: Rehabilitation  SUBJECTIVE:                                                                                                                                                                                              SUBJECTIVE STATEMENT: Nothing new.  Things about the same.  Plan to call [facility] tomorrow.  Want to pursue the prescription for the AFO.  Pt accompanied by: self  PERTINENT HISTORY: lung cancer with brain metastasis s/p crainiotomy 2016; radiation   PAIN:  Are you having pain? No  PRECAUTIONS: Fall  WEIGHT BEARING RESTRICTIONS: No  FALLS: Has patient fallen in last 6 months? No Hx of falls in the past; has had rib fractures LIVING ENVIRONMENT: Lives with: lives alone Lives in: House/apartment Stairs: Yes: Internal: 12 steps; does not go up the steps and External: 1 steps; none Has following equipment at home: Single point cane and walking poles Has foot-up brace, does not use  PLOF: Independent and Leisure: reports not doing as much due to decreased confidence  PATIENT GOALS: Improve R leg strength, balance, improved walking  OBJECTIVE:    TODAY'S TREATMENT: 08/25/2022 Activity Comments  Reviewed new exercises as part of HEP: -minisquat with 8# weight -suitcase carry with 8# weight -forward step ups Good return demo  Standing RLE hamstring curls Not full range against gravity  Gait with R Thusane AFO (lateral strut), 200 ft  Cues for increased hip flexion to initiate foot clearance, overall slightly less hip circumduction and improved foot clearance  Gait with plastic R AFO, 100 ft Pushes knee into flexion, even though about 50% improved foot clearance         PATIENT EDUCATION: Education details: Reviewed options for AFO and how to pursue (PT to initiate by requesting MD order, MD to document in next patient visit note); once order received, plan to f/u with [facility] for scheduling.  Discussed POC-pt would like to get started at [facility] for optimal, ongoing fitness routine (PT plans to send referral to [facility]) and then return in 6-8  weeks once he receives brace for additional PT and gait/balance training with AFO Person educated: Patient Education method: Explanation Education comprehension: verbalized understanding   Access  Code: [email or web address] URL: [email or web address] Date: 08/05/2022 Prepared by: [facility] - Outpatient  Rehab - [facility]  Exercises - Seated Hamstring stretch  - 2 x daily - 7 x weekly - 1 sets - 3 reps - 30 sec hold - Standing Gastroc Stretch at Counter  - 2 x daily - 7 x weekly - 1 sets - 3 reps - 30 sec hold - Hip Flexion  - 1 x daily - 5 x weekly - 2 sets - 10 reps - Seated Table Hamstring Stretch  - 1-2 x daily - 7 x weekly - 1 sets - 3 reps - 30 sec hold - Sit to Stand  - 1-2 x daily - 7 x weekly - 2 sets - 5 reps - 3 sec hold - Seated Long Arc Quad with Ankle Weight  - 1 x daily - 5 x weekly - 3 sets - 10 reps - Seated Hip Flexion March with Ankle Weights  - 1 x daily - 5 x weekly - 3 sets - 10 reps - Standing Marching  - 1 x daily - 5 x weekly - 2 sets - 10 reps - Side Stepping with Counter Support  - 1 x daily - 5 x weekly - 1 sets - 3-5 reps - Squat  - 1 x daily - 5 x weekly - 3 sets - 10 reps - Kettlebell Suitcase Carry  - 1 x daily - 5 x weekly - 1 sets - 3-5 reps - Forward Step Up  - 1 x daily - 5 x weekly - 3 sets - 10 reps    -------------------------------------------------- Objective measures below taken at initial evaluation: DIAGNOSTIC FINDINGS: NA   COGNITION: Overall cognitive status: Within functional limits for tasks assessed   SENSATION: Light touch: WFL  MUSCLE TONE: RLE: Moderate and clonus   LOWER EXTREMITY ROM:     Active  Right Eval Left Eval  Hip flexion    Hip extension    Hip abduction    Hip adduction    Hip internal rotation    Hip external rotation    Knee flexion    Knee extension -8   Ankle dorsiflexion Neutral (passive +5)   Ankle plantarflexion    Ankle inversion    Ankle eversion     (Blank rows = not  tested)  LOWER EXTREMITY MMT:    MMT Right Eval Left Eval  Hip flexion 3+ 4  Hip extension    Hip abduction    Hip adduction    Hip internal rotation    Hip external rotation    Knee flexion 3+ 4  Knee extension 4 4+  Ankle dorsiflexion 3-   Ankle plantarflexion 3-   Ankle inversion    Ankle eversion    (Blank rows = not tested)   TRANSFERS: Assistive device utilized: None  Sit to stand: Modified independence and increased weightbearing through LLE Stand to sit: Modified independence and increased weightbearing through LLE  STAIRS: Level of Assistance: CGA Stair Negotiation Technique: Step to Pattern with Single Rail on Left Number of Stairs: 2-3  Height of Stairs: 4-6"  Comments: reports using step to pattern at home; descends with LLE leading, decreased R foot clearance  GAIT: Gait pattern: step to pattern, step through pattern, decreased arm swing- Right, decreased step length- Right, decreased stance time- Right, decreased ankle dorsiflexion- Right, circumduction- Right, Right hip hike, decreased trunk rotation, and poor foot clearance- Right Distance walked: 50 ft Assistive device utilized: None Level of assistance: SBA  and CGA   FUNCTIONAL TESTS:  5 times sit to stand: 22.53 sec Timed up and go (TUG): 19.94 sec 2 minute walk test: 200 ft 10 meter walk test: 19.34sec  (1.7 ft/sec)    TODAY'S TREATMENT:                                                                                                                              DATE: 06/28/2022    PATIENT EDUCATION: Education details: PT eval results, POC, initial HEP Person educated: Patient Education method: Explanation, Demonstration, and Handouts Education comprehension: verbalized understanding, returned demonstration, and needs further education  HOME EXERCISE PROGRAM: Access Code: [email or web address] URL: [email or web address] Date: 06/28/2022 Prepared by: [facility] - Outpatient  Rehab - [facility]  Exercises - Seated Hamstring stretch  - 2 x daily - 7 x weekly - 1 sets - 3 reps - 30 sec hold - Standing Gastroc Stretch at Counter  - 2 x daily - 7 x weekly - 1 sets - 3 reps - 30 sec hold  GOALS: Goals reviewed with patient? Yes  SHORT TERM GOALS: Target date: 07/29/2022  Pt will be independent with HEP for improved strength, flexibility, balance, gait. Baseline:  Goal status: MET 07/26/2022  2.  Pt will improve 5x sit<>stand to less than or equal to 18 sec to demonstrate improved functional strength and transfer efficiency. Baseline: 22.53 sec>18.94 sec 07/29/2022 Goal status: PARTIALLY MET  LONG TERM GOALS: Target date: 08/12/2022  Pt will be independent with HEP for improved strength, flexibility, balance, gait. Baseline:  Goal status: MET  2.  Pt will improve 5x sit<>stand to less than or equal to 12 sec to demonstrate improved functional strength and transfer efficiency. Baseline: 22.53 sec>18.82 sec 08/05/2022 Goal status: IN PROGRESS  3.  Pt will improve TUG score to less than or equal to 15 sec for decreased fall risk. Baseline: 19.94 sec>17.18 sec 08/05/2022 Goal status: IN PROGRESS  4.  Pt will improve gait velocity to at least 2 ft/sec for improved gait efficiency and safety. Baseline: 1.7 ft/sec>1.81 ft/sec 08/05/2022 Goal status: IN PROGRESS  5.  Pt will improve 2 MWT to at least 250 ft for improved gait efficiency and endurance for gait. Baseline: 200 ft> 210 ft 08/05/2022 Goal status: IN PROGRESS  UPDATED GOALS per recert 08/25/2022  SHORT TERM GOALS: Target date: 09/23/2022  Pt will be independent with HEP/community follow up for ongoing fitness at [facility], while waiting for AFO fitting.  Baseline: Goal status: INITIAL  LONG TERM GOALS: Target date: 10/21/2022  Pt will be independent with donning/doffing AFO, as well as wear/care of AFO. Baseline: awaiting AFO consult Goal status: INITIAL  2.  Pt will improve 5x sit<>stand to less than or  equal to 12 sec to demonstrate improved functional strength and transfer efficiency. Baseline: 18.82 sec 08/05/2022 Goal status: INITIAL  3.  Pt will improve TUG score to less than or equal to 13 sec for decreased fall  risk. Baseline: 17.18 sec 08/05/2022 Goal status: INITIAL  4.  Pt will improve gait velocity to at least 2 ft/sec for improved gait efficiency and safety. Baseline: 1.81 ft/sec 08/05/2022 Goal status: INITIAL  5.  Pt will improve 2 MWT to at least 250 ft for improved gait efficiency and endurance for gait. Baseline: 210 ft 08/05/2022 Goal status: INITIAL   ASSESSMENT:  CLINICAL IMPRESSION: Pt returns to [facility] today, reporting plans to meet with and start [employer] (referral sent from PT today), to continue fitness routine for further leg strengthening and balance, in addition to continuing his current HEP.  He has taken several weeks off of therapy to focus on HEP and he reports/demo this continues to challenge him for home.  Discussed, trialed AFOs in clinic again today for RLE, and pt feels he would like to pursue AFO consult.  With trials in clinic, pt demo slightly improved foot clearance and increased step length; decreased R hip flexor strength plays a role in difficulty with initiating hip/knee flexion for optimal foot clearance, but with cues and repetition, he improves.  The AFO on RLE will provide additional stability for RLE for improved knee stability and increased RLE single limb stance for more optimal gait pattern.  He is agreeable to PT requesting order from Dr. Barbaraann Cao and pt will see him next week.  Plan to recert PT, to include this visit and for follow-up reassessment once pt receives his AFO to further improve gait, strength, and balance for optimized functional mobility.    OBJECTIVE IMPAIRMENTS: Abnormal gait, decreased balance, decreased knowledge of use of DME, decreased mobility, difficulty walking, decreased ROM, decreased strength, impaired  flexibility, impaired tone, and postural dysfunction.   ACTIVITY LIMITATIONS: carrying, standing, squatting, stairs, transfers, and locomotion level  PARTICIPATION LIMITATIONS: shopping, community activity, and yard work  PERSONAL FACTORS: 1-2 comorbidities: see above  are also affecting patient's functional outcome.   REHAB POTENTIAL: Good  CLINICAL DECISION MAKING: Evolving/moderate complexity  EVALUATION COMPLEXITY: Moderate  PLAN:  PT FREQUENCY:  3 visits (including today) in next 8 weeks  PT DURATION: 8 weeks   PLANNED INTERVENTIONS: Therapeutic exercises, Therapeutic activity, Neuromuscular re-education, Balance training, Gait training, Patient/Family education, Self Care, Stair training, Orthotic/Fit training, DME instructions, and Manual therapy  PLAN FOR NEXT SESSION:  *Documentation for AFO-send to MD.  Once order received in chart, PT to follow up with [facility].   Pt to work consistently on HEP and at [employer] and on hold for PT until he receives his AFO.  Gean Maidens., PT 08/25/2022, 1:49 PM  [facility] at [facility] [address] [place], [address] [place], [place] [address] Phone # [phone] Fax # [phone]

## 2022-08-25 NOTE — Patient Instructions (Signed)
[facility] [address] [place], [place] [address] Phone: [phone] Fax: [phone]   Once we let you know that your order for AFO has been placed, we will make sure to send your information to [facility].  They will contact you for scheduling.

## 2022-08-26 ENCOUNTER — Telehealth: Payer: Self-pay | Admitting: Physical Therapy

## 2022-08-26 NOTE — Telephone Encounter (Signed)
Hi Dr. Barbaraann Cao,  Timothy Obrien is being treated by physical therapy for R sided weakness due to malignant neoplasm to brain.  He will benefit from use of Right AFO in order to improve safety with functional mobility.    Due to insurance regulations, patients must have a face-to-face visit with a physician within the last 6 months wherein the benefit of bracing has been discussed and documented in the patient file. Timothy Obrien has an upcoming appointment with you.  During this visit in regards to the AFO,  we need the following from you. A Rx stating "Eval and Fit with Orthotic Bracing to improve gait" Indicate in your clinical notes "Discussed orthotic bracing with patient and family, patient will functionally benefit"   If you agree, please submit request in EPIC under MD Order, Other Orders (list Right AFO in comments) and please include the ICD-10 code, MD signature, and NPI. You may also fax to [facility] at [phone].   Thank you, Lonia Blood, PT 08/26/22 11:21 AM Phone: [phone] Fax: [phone]  [facility] at [facility] [address] [place], [address] [place], [place] [address] Phone # [phone] Fax # [phone]    [facility] [address] [place], [place]  [address] Phone:  [phone] Fax:  [phone]

## 2022-08-30 ENCOUNTER — Telehealth: Payer: Self-pay | Admitting: *Deleted

## 2022-08-30 ENCOUNTER — Inpatient Hospital Stay: Payer: Medicare Other | Attending: Internal Medicine | Admitting: Internal Medicine

## 2022-08-30 VITALS — BP 138/88 | HR 61 | Temp 97.8°F | Resp 13 | Wt 163.5 lb

## 2022-08-30 DIAGNOSIS — R531 Weakness: Secondary | ICD-10-CM | POA: Diagnosis not present

## 2022-08-30 DIAGNOSIS — C3432 Malignant neoplasm of lower lobe, left bronchus or lung: Secondary | ICD-10-CM | POA: Diagnosis not present

## 2022-08-30 DIAGNOSIS — Z87891 Personal history of nicotine dependence: Secondary | ICD-10-CM | POA: Diagnosis not present

## 2022-08-30 DIAGNOSIS — F419 Anxiety disorder, unspecified: Secondary | ICD-10-CM | POA: Insufficient documentation

## 2022-08-30 DIAGNOSIS — Z7982 Long term (current) use of aspirin: Secondary | ICD-10-CM | POA: Insufficient documentation

## 2022-08-30 DIAGNOSIS — Z79899 Other long term (current) drug therapy: Secondary | ICD-10-CM | POA: Insufficient documentation

## 2022-08-30 DIAGNOSIS — F32A Depression, unspecified: Secondary | ICD-10-CM | POA: Diagnosis not present

## 2022-08-30 DIAGNOSIS — C7931 Secondary malignant neoplasm of brain: Secondary | ICD-10-CM | POA: Diagnosis not present

## 2022-08-30 NOTE — Progress Notes (Signed)
[facility] [address]  [place], [place] [address] [phone]   Interval Patient Evaluation  Date of Service: 08/30/22 Patient Name: Timothy Obrien Patient MRN: [id number] Patient DOB: 1960-02-04 Provider: Henreitta Leber, MD  Identifying Statement:  Timothy Obrien is a 62 y.o. male with Malignant neoplasm metastatic to brain [facility]) [C79.31]   Primary Cancer: NSCLC Stage IV  Oncologic History: 12/25/14: MRI demonstrates left frontal mestastasis, SRS is performed followed by resection. 12/23/16: Patient describes right leg weakness, some progression noted on MRI 05/12/17: Resection of left frontal progressive lesion.  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  Interval History:  Timothy Obrien presents today for follow up.  No new or progressive changes.  Continues to have some right leg weakness, stable from prior.  Denies seizures or headaches.  Mood has improved since starting the zoloft.  He is more active and energetic, less "down" periods.  He is spending more time with his daughters.  Medications: Current Outpatient Medications on File Prior to Visit  Medication Sig Dispense Refill   acetaminophen (TYLENOL) 500 MG tablet Take 500 mg by mouth 2 (two) times daily as needed.     aspirin EC 81 MG tablet Take 81 mg by mouth daily. Swallow whole.     cetirizine (ZYRTEC) 10 MG tablet Take 10 mg by mouth daily.     ibuprofen (ADVIL,MOTRIN) 600 MG tablet Take 600 mg by mouth 2 (two) times daily as needed.     predniSONE (DELTASONE) 50 MG tablet 1 tablet 14 hours then 7 hours then 2 hours before the CT scan.  Please take 25 of Benadryl with the last dose of the steroid 2 hours before the scan. 3 tablet 0   sertraline (ZOLOFT) 25 MG tablet TAKE 1 TABLET (25 MG TOTAL) BY MOUTH DAILY. 90 tablet 2   No current facility-administered medications on file prior to visit.    Allergies:  Allergies  Allergen Reactions   Iodinated Contrast Media Rash and  Other (See Comments)    Pinpoint rash, facial redness/flushed appearance; PT WILL NEED PREMEDICATION PRIOR TO FUTURE CONTRAST EXPOSURE   Past Medical History:  Past Medical History:  Diagnosis Date   Anxiety    Bronchitis    Chronic fatigue 09/17/2015   Depression 02/11/2016   Hypertension 12/31/2015   states never been told   Lung cancer (HCC) dx'd 2016   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   Pneumonia    Seizures (HCC)    last 12/11/14   Shortness of breath dyspnea    with exertion   Tobacco abuse    Past Surgical History:  Past Surgical History:  Procedure Laterality Date   APPLICATION OF CRANIAL NAVIGATION N/A 12/26/2014   Procedure: APPLICATION OF CRANIAL NAVIGATION;  Surgeon: Loura Halt Ditty, MD;  Location: [facility] NEURO [facility]S;  Service: Neurosurgery;  Laterality: N/A;   APPLICATION OF CRANIAL NAVIGATION Left 05/12/2017   Procedure: APPLICATION OF CRANIAL NAVIGATION;  Surgeon: Lisbeth Renshaw, MD;  Location: [facility] OR;  Service: Neurosurgery;  Laterality: Left;  APPLICATION OF CRANIAL NAVIGATION   CRANIOTOMY N/A 12/26/2014   Procedure: CRANIOTOMY TUMOR EXCISION with BrainLab;  Surgeon: Loura Halt Ditty, MD;  Location: [facility];  Service: Neurosurgery;  Laterality: N/A;  CRANIOTOMY TUMOR EXCISION with Stealth   CRANIOTOMY Left 05/12/2017   Procedure: STEREOTACTIC LEFT FRONTOPERIETIAL CRANIOTOMY FOR RESECTION OF TUMOR;  Surgeon: Lisbeth Renshaw, MD;  Location: [facility] OR;  Service: Neurosurgery;  Laterality: Left;  STEREOTACTIC  LEFT FRONTOPERIETIAL CRANIOTOMY FOR RESECTION OF TUMOR   HERNIA REPAIR     VASECTOMY     VIDEO ASSISTED THORACOSCOPY (VATS)/WEDGE RESECTION Left 01/14/2015   Procedure: VIDEO ASSISTED THORACOSCOPY (VATS)/WEDGE RESECTION, Superior segmentectomy left  lower lobe, wedge resection of left upper lobe, multiple lymph node disection, On Q insertion.;  Surgeon: Delight Ovens, MD;  Location: [facility] OR;  Service: Thoracic;  Laterality: Left;   VIDEO BRONCHOSCOPY  Bilateral 12/16/2014   Procedure: VIDEO BRONCHOSCOPY WITH FLUORO;  Surgeon: Leslye Peer, MD;  Location: [facility] ENDOSCOPY;  Service: Cardiopulmonary;  Laterality: Bilateral;   VIDEO BRONCHOSCOPY N/A 01/14/2015   Procedure: VIDEO BRONCHOSCOPY;  Surgeon: Delight Ovens, MD;  Location: [facility] OR;  Service: Thoracic;  Laterality: N/A;   Social History:  Social History   Socioeconomic History   Marital status: Divorced    Spouse name: Not on file   Number of children: Not on file   Years of education: Not on file   Highest education level: Not on file  Occupational History   Not on file  Tobacco Use   Smoking status: Former    Current packs/day: 0.00    Average packs/day: 1 pack/day for 36.0 years (36.0 ttl pk-yrs)    Types: Cigarettes    Start date: 12/14/1979    Quit date: 10/07/2015    Years since quitting: 6.9   Smokeless tobacco: Current   Tobacco comments:    Vapes: Few cigars a day. Quit cigarettes after easter.  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 He does have a cat but no other home pets. No mold exposure. Recent travel to [place] but with symptoms at the onset of travel.   Social Determinants of Health   Financial Resource Strain: Not on file  Food Insecurity: Not on file  Transportation Needs: Not on file  Physical Activity: Not on file  Stress: Not on file  Social Connections: Not on file  Intimate Partner Violence: Not on file   Family History:  Family History  Problem Relation Age of Onset   Breast cancer Mother    Colon polyps Mother    Arthritis Father      Review of Systems: Constitutional: Denies fevers, chills or abnormal weight loss Eyes: Denies blurriness of vision Ears, nose, mouth, throat, and face: Denies mucositis or sore throat Respiratory: Denies cough, dyspnea or wheezes Cardiovascular: Denies palpitation, chest discomfort or lower extremity swelling Gastrointestinal:  Denies nausea, constipation, diarrhea GU: Denies dysuria or incontinence Skin: Denies abnormal skin rashes Neurological: Per HPI Musculoskeletal: Denies joint pain, back or neck discomfort. No decrease in ROM Behavioral/Psych: Denies anxiety, disturbance in thought content, and mood instability   Physical Exam: There were no vitals filed for this visit.  KPS: 80. General: Alert, cooperative, pleasant, in no acute distress Head: Normal EENT: No conjunctival injection or scleral icterus. Oral mucosa moist Lungs: Resp effort normal Cardiac: Regular rate and rhythm Abdomen: Soft, non-distended abdomen Skin: No rashes cyanosis or petechiae. Extremities: No clubbing  or edema  Neurologic Exam: Mental Status: Awake, alert, attentive to examiner. Oriented to self and environment. Language is fluent with intact comprehension.  Cranial Nerves: Visual acuity is grossly normal. Visual fields are full. Extra-ocular movements intact. No ptosis. Face is symmetric, tongue midline. Motor: Tone and bulk are normal. Pronator drift in right, he is 4/5 in right leg. Reflexes are increased on left with mild spasticity and 10 beats right ankle clonus.  Sensory: Intact to light touch and temperature Gait: Hemiparetic gait  Labs: I have reviewed the data as listed    Component Value Date/Time   NA 130 (L) 06/30/2022 0906   NA 137 01/12/2017 0846   K 4.3 06/30/2022 0906   K 4.4 01/12/2017 0846   CL 99 06/30/2022 0906   CO2 24 06/30/2022 0906   CO2 18 (L) 01/12/2017 0846   GLUCOSE 121 (H) 06/30/2022 0906   GLUCOSE 91 01/12/2017 0846   BUN 8 06/30/2022 0906   BUN 15.3  01/12/2017 0846   CREATININE 0.98 06/30/2022 0906   CREATININE 1.0 01/12/2017 0846   CALCIUM 9.0 06/30/2022 0906   CALCIUM 9.0 01/12/2017 0846   PROT 6.7 06/30/2022 0906   PROT 7.2 01/12/2017 0846   ALBUMIN 4.3 06/30/2022 0906   ALBUMIN 3.9 01/12/2017 0846   AST 39 06/30/2022 0906   AST 23 01/12/2017 0846   ALT 28 06/30/2022 0906   ALT 22 01/12/2017 0846   ALKPHOS 68 06/30/2022 0906   ALKPHOS 99 01/12/2017 0846   BILITOT 0.5 06/30/2022 0906   BILITOT 0.41 01/12/2017 0846   GFRNONAA >60 06/30/2022 0906   GFRAA >60 06/04/2019 1116   Lab Results  Component Value Date   WBC 6.3 06/30/2022   NEUTROABS 4.1 06/30/2022   HGB 15.3 06/30/2022   HCT 44.3 06/30/2022   MCV 94.3 06/30/2022   PLT 300 06/30/2022    Imaging:  No results found.   [facility] Clinician Interpretation: I have personally reviewed the radiological images as listed.  My interpretation, in the context of the patient's clinical presentation, is stable disease pending official read   Assessment/Plan 1. Brain metastasis (HCC)  2. Right leg weakness  Mr. Bergara is clinically stable today.  He continues to experience chronic right leg weakness despite aggressive PT.  Discussed orthotic bracing with patient, patient will functionally benefit   Agreeable to continue zoloft 25mg  daily for depression symptoms.    We ask that Daisy Floro return to clinic in 9 months following next brain MRI, or sooner as needed.  We appreciate the opportunity to participate in the care of Timothy Obrien.    All questions were answered. The patient knows to call the clinic with any problems, questions or concerns. No barriers to learning were detected.  The total time spent in the encounter was 30 minutes and more than 50% was on counseling and review of test results   Henreitta Leber, MD Medical Director of Neuro-Oncology [facility] [place] 08/30/22 10:57 AM

## 2022-08-30 NOTE — Telephone Encounter (Signed)
Prescription faxed to OP rehab at [facility] for "Evaluation and fit with orthotic bracing to improve gait"

## 2022-09-02 ENCOUNTER — Telehealth: Payer: Self-pay | Admitting: Physical Therapy

## 2022-09-02 NOTE — Telephone Encounter (Signed)
Faxed MD script, Face sheet, and MD notes to [facility] for AFO consult.  Lonia Blood, PT 09/02/22 12:14 PM Phone: [phone] Fax: [phone]  [facility] at [facility] [address] [place], [address] [place], [place] [address] Phone # [phone] Fax # [phone]

## 2023-02-28 ENCOUNTER — Other Ambulatory Visit: Payer: Self-pay | Admitting: Internal Medicine

## 2023-03-10 ENCOUNTER — Telehealth: Payer: Self-pay | Admitting: Internal Medicine

## 2023-03-10 NOTE — Telephone Encounter (Signed)
 Rescheduled appointment per provider on call. Left the patient a voicemail with new appointment details.

## 2023-05-10 ENCOUNTER — Other Ambulatory Visit: Payer: Self-pay | Admitting: Radiation Therapy

## 2023-05-11 ENCOUNTER — Encounter: Payer: Self-pay | Admitting: Physical Therapy

## 2023-05-11 NOTE — Therapy (Signed)
 [place] [facility] [address], [address] [place], [place], [address] Phone: [phone]   Fax:  [phone]  Patient Details  Name: Timothy Obrien MRN: [id number] Date of Birth: 1960-07-08 Referring Provider:  No ref. provider found  Encounter Date: 05/11/2023  PHYSICAL THERAPY DISCHARGE SUMMARY  Visits from Start of Care: 11 (pt did not return after 08/25/2022 visit)  Current functional level related to goals / functional outcomes: Not fully assessed, as pt did not return after 08/25/2022 visit   Remaining deficits: Strength, balance, gait   Education / Equipment: HEP, recommendations for and process to obtain AFO   Patient agrees to discharge. Patient goals were not met. Patient is being discharged due to not returning since the last visit.   Arlet Marter W., PT 05/11/2023, 2:39 PM  [facility] [address] [place], [place], [address] Phone: [phone]   Fax:  [phone]

## 2023-05-29 ENCOUNTER — Encounter

## 2023-06-14 ENCOUNTER — Other Ambulatory Visit: Payer: Self-pay | Admitting: Medical Oncology

## 2023-06-14 DIAGNOSIS — Z91041 Radiographic dye allergy status: Secondary | ICD-10-CM

## 2023-06-14 MED ORDER — PREDNISONE 50 MG PO TABS
ORAL_TABLET | ORAL | 2 refills | Status: AC
Start: 2023-06-14 — End: ?

## 2023-06-20 ENCOUNTER — Inpatient Hospital Stay: Payer: Medicare Other | Attending: Internal Medicine

## 2023-06-20 ENCOUNTER — Ambulatory Visit (HOSPITAL_COMMUNITY)
Admission: RE | Admit: 2023-06-20 | Discharge: 2023-06-20 | Disposition: A | Discharge status: Home / Self Care | Payer: Medicare Other | Source: Ambulatory Visit | Attending: Internal Medicine | Admitting: Internal Medicine

## 2023-06-20 DIAGNOSIS — C7931 Secondary malignant neoplasm of brain: Secondary | ICD-10-CM | POA: Diagnosis not present

## 2023-06-20 DIAGNOSIS — C3490 Malignant neoplasm of unspecified part of unspecified bronchus or lung: Secondary | ICD-10-CM | POA: Diagnosis not present

## 2023-06-20 DIAGNOSIS — C3432 Malignant neoplasm of lower lobe, left bronchus or lung: Secondary | ICD-10-CM | POA: Diagnosis not present

## 2023-06-20 DIAGNOSIS — Z87891 Personal history of nicotine dependence: Secondary | ICD-10-CM | POA: Insufficient documentation

## 2023-06-20 DIAGNOSIS — N281 Cyst of kidney, acquired: Secondary | ICD-10-CM | POA: Diagnosis not present

## 2023-06-20 DIAGNOSIS — J432 Centrilobular emphysema: Secondary | ICD-10-CM | POA: Diagnosis not present

## 2023-06-20 DIAGNOSIS — R531 Weakness: Secondary | ICD-10-CM | POA: Insufficient documentation

## 2023-06-20 LAB — CMP (CANCER CENTER ONLY)
ALT: 19 U/L (ref 0–44)
AST: 22 U/L (ref 15–41)
Albumin: 4.6 g/dL (ref 3.5–5.0)
Alkaline Phosphatase: 78 U/L (ref 38–126)
Anion gap: 10 (ref 5–15)
BUN: 19 mg/dL (ref 8–23)
CO2: 22 mmol/L (ref 22–32)
Calcium: 9.2 mg/dL (ref 8.9–10.3)
Chloride: 105 mmol/L (ref 98–111)
Creatinine: 0.92 mg/dL (ref 0.61–1.24)
GFR, Estimated: >60 mL/min (ref >60)
Glucose, Bld: 157 mg/dL — ABNORMAL HIGH (ref 70–99)
Potassium: 4.3 mmol/L (ref 3.5–5.1)
Sodium: 137 mmol/L (ref 135–145)
Total Bilirubin: 0.4 mg/dL (ref 0.0–1.2)
Total Protein: 7.4 g/dL (ref 6.5–8.1)

## 2023-06-20 LAB — CBC WITH DIFFERENTIAL (CANCER CENTER ONLY)
Abs Immature Granulocytes: 0.08 10*3/uL — ABNORMAL HIGH (ref 0.00–0.07)
Basophils Absolute: 0 10*3/uL (ref 0.0–0.1)
Basophils Relative: 0 %
Eosinophils Absolute: 0 10*3/uL (ref 0.0–0.5)
Eosinophils Relative: 0 %
HCT: 42.6 % (ref 39.0–52.0)
Hemoglobin: 14.7 g/dL (ref 13.0–17.0)
Immature Granulocytes: 1 %
Lymphocytes Relative: 12 %
Lymphs Abs: 1.2 10*3/uL (ref 0.7–4.0)
MCH: 32.3 pg (ref 26.0–34.0)
MCHC: 34.5 g/dL (ref 30.0–36.0)
MCV: 93.6 fL (ref 80.0–100.0)
Monocytes Absolute: 0.1 10*3/uL (ref 0.1–1.0)
Monocytes Relative: 1 %
Neutro Abs: 8.7 10*3/uL — ABNORMAL HIGH (ref 1.7–7.7)
Neutrophils Relative %: 86 %
Platelet Count: 297 10*3/uL (ref 150–400)
RBC: 4.55 MIL/uL (ref 4.22–5.81)
RDW: 13.6 % (ref 11.5–15.5)
WBC Count: 10 10*3/uL (ref 4.0–10.5)
nRBC: 0 % (ref 0.0–0.2)

## 2023-06-20 MED ORDER — IOHEXOL 300 MG/ML  SOLN
100.0000 mL | Freq: Once | INTRAMUSCULAR | Status: AC | PRN
  Administered 2023-06-20: 100 mL via INTRAVENOUS

## 2023-06-20 MED ORDER — SODIUM CHLORIDE (PF) 0.9 % IJ SOLN
INTRAMUSCULAR | Status: AC
Start: 2023-06-20 — End: ?
  Filled 2023-06-20: qty 50

## 2023-06-22 ENCOUNTER — Ambulatory Visit
Admission: RE | Admit: 2023-06-22 | Discharge: 2023-06-22 | Disposition: A | Discharge status: Home / Self Care | Source: Ambulatory Visit | Attending: Internal Medicine | Admitting: Internal Medicine

## 2023-06-22 DIAGNOSIS — C3490 Malignant neoplasm of unspecified part of unspecified bronchus or lung: Secondary | ICD-10-CM | POA: Diagnosis not present

## 2023-06-22 DIAGNOSIS — C7931 Secondary malignant neoplasm of brain: Secondary | ICD-10-CM

## 2023-06-22 MED ORDER — GADOPICLENOL 0.5 MMOL/ML IV SOLN
8.0000 mL | Freq: Once | INTRAVENOUS | Status: AC | PRN
  Administered 2023-06-22: 8 mL via INTRAVENOUS

## 2023-06-26 ENCOUNTER — Encounter

## 2023-06-27 ENCOUNTER — Telehealth: Payer: Medicare Other | Admitting: Internal Medicine

## 2023-06-29 ENCOUNTER — Inpatient Hospital Stay (HOSPITAL_BASED_OUTPATIENT_CLINIC_OR_DEPARTMENT_OTHER): Payer: Medicare Other | Admitting: Internal Medicine

## 2023-06-29 DIAGNOSIS — C3490 Malignant neoplasm of unspecified part of unspecified bronchus or lung: Secondary | ICD-10-CM | POA: Diagnosis not present

## 2023-06-29 NOTE — Progress Notes (Signed)
 [facility] Telephone:([phone]   Fax:([phone]) [phone]  PROGRESS NOTE FOR TELEMEDICINE VISITS  [name], MD [address] [place] [address]  I connected withNAME@ on 06/29/23 at  1:30 PM EDT by video enabled telemedicine visit and verified that I am speaking with the correct person using two identifiers.   I discussed the limitations, risks, security and privacy concerns of performing an evaluation and management service by telemedicine and the availability of in-person appointments. I also discussed with the patient that there may be a patient responsible charge related to this service. The patient expressed understanding and agreed to proceed.  Other persons participating in the visit and their role in the encounter:  None  Patient's location:  Home Provider's location: [facility]  DIAGNOSIS:   1) Stage IV (T2a, N1, M1b) non-small cell lung cancer, adenocarcinoma, negative EGFR mutation but equivocal EGFR amplification, negative ALK gene translocation and negative ROS 1 but with PDL-1 expression 100% presented with left lower lobe lung mass in addition to left hilar adenopathy and solitary metastatic brain lesion diagnosed in December 2016. 2) nonocclusive right upper lobe pulmonary artery embolism diagnosed on incidental CT scan of the chest on 06/04/2019   PRIOR THERAPY: 1) status post stereotactic radiotherapy and surgical resection diagnosed in December 2016. 2) status post video bronchoscopy with left VATS and wedge resection of the left upper lobe and left lower lobe superior segmentectomy with lymph node dissection under the care of Dr. Nicanor Barge on 01/14/2015. 3) First-line treatment with immunotherapy with Ketruda (pembrolizumab ) 200 mg IV every 3 weeks status post 35 cycles. First cycle was given 02/18/2015.  Discontinued after the patient completed 2 years of treatment. 4) stereotactic left frontal craniotomy for resection of  tumor and the final pathology was consistent with tumor necrosis.  This was done on 05/12/2017 5) Xarelto  15 mg p.o. twice daily for 3 weeks followed by 20 mg p.o. daily.  First dose started 06/04/2019 and discontinued on June 16, 2020     CURRENT THERAPY: Observation.  INTERVAL HISTORY: Timothy Obrien 63 y.o. male has a MyChart virtual video visit with me today for evaluation and discussion of his discuss results.Discussed the use of AI scribe software for clinical note transcription with the patient, who gave verbal consent to proceed.  History of Present Illness   Timothy Obrien is a 63 year old male with stage 4 non-small cell lung cancer who presents for a virtual video visit for evaluation and discussion of recent imaging results.  He has a history of stage 4 non-small cell lung cancer, adenocarcinoma with positive PD-L1 expression of 100% and negative actionable mutations, diagnosed in December 2016. He underwent stereotactic radiosurgery followed by surgical resection for brain metastasis. Subsequently, he was treated with immunotherapy using Keytruda  every three weeks for two years, completing this regimen in January 2019. Since then, he has been under observation.  During the virtual visit, he reports feeling the same as before, with no new symptoms, changes, or problems. No chest pain or breathing issues. He uses a smartwatch to monitor his sleep, which he finds informative.  Recent imaging of the chest, abdomen, and pelvis was performed last week. He recalls an allergic reaction to contrast during a previous scan but notes no such reaction this time after following the prep protocol with prednisone .       MEDICAL HISTORY: Past Medical History:  Diagnosis Date   Anxiety    Bronchitis  Chronic fatigue 09/17/2015   Depression 02/11/2016   Hypertension 12/31/2015   states never been told   Lung cancer (HCC) dx'd 2016   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   Pneumonia     Seizures (HCC)    last 12/11/14   Shortness of breath dyspnea    with exertion   Tobacco abuse     ALLERGIES:  is allergic to iodinated contrast media.  MEDICATIONS:  Current Outpatient Medications  Medication Sig Dispense Refill   acetaminophen  (TYLENOL ) 500 MG tablet Take 500 mg by mouth 2 (two) times daily as needed.     aspirin EC 81 MG tablet Take 81 mg by mouth daily. Swallow whole.     cetirizine (ZYRTEC) 10 MG tablet Take 10 mg by mouth daily.     ibuprofen (ADVIL,MOTRIN) 600 MG tablet Take 600 mg by mouth 2 (two) times daily as needed.     predniSONE  (DELTASONE ) 50 MG tablet 1 tablet 14 hours then 7 hours then 2 hours before the CT scan.  Please take 25 of Benadryl  with the last dose of the steroid 2 hours before the scan. 3 tablet 2   sertraline  (ZOLOFT ) 25 MG tablet TAKE 1 TABLET (25 MG TOTAL) BY MOUTH DAILY. 90 tablet 2   No current facility-administered medications for this visit.    SURGICAL HISTORY:  Past Surgical History:  Procedure Laterality Date   APPLICATION OF CRANIAL NAVIGATION N/A 12/26/2014   Procedure: APPLICATION OF CRANIAL NAVIGATION;  Surgeon: Raelene Bullocks Ditty, MD;  Location: [facility];  Service: Neurosurgery;  Laterality: N/A;   APPLICATION OF CRANIAL NAVIGATION Left 05/12/2017   Procedure: APPLICATION OF CRANIAL NAVIGATION;  Surgeon: Augusto Blonder, MD;  Location: [facility] OR;  Service: Neurosurgery;  Laterality: Left;  APPLICATION OF CRANIAL NAVIGATION   CRANIOTOMY N/A 12/26/2014   Procedure: CRANIOTOMY TUMOR EXCISION with BrainLab;  Surgeon: Raelene Bullocks Ditty, MD;  Location: [facility];  Service: Neurosurgery;  Laterality: N/A;  CRANIOTOMY TUMOR EXCISION with Stealth   CRANIOTOMY Left 05/12/2017   Procedure: STEREOTACTIC LEFT FRONTOPERIETIAL CRANIOTOMY FOR RESECTION OF TUMOR;  Surgeon: Augusto Blonder, MD;  Location: [facility] OR;  Service: Neurosurgery;  Laterality: Left;  STEREOTACTIC LEFT FRONTOPERIETIAL CRANIOTOMY FOR RESECTION OF TUMOR   HERNIA  REPAIR     VASECTOMY     VIDEO ASSISTED THORACOSCOPY (VATS)/WEDGE RESECTION Left 01/14/2015   Procedure: VIDEO ASSISTED THORACOSCOPY (VATS)/WEDGE RESECTION, Superior segmentectomy left  lower lobe, wedge resection of left upper lobe, multiple lymph node disection, On Q insertion.;  Surgeon: Norita Beauvais, MD;  Location: [facility] OR;  Service: Thoracic;  Laterality: Left;   VIDEO BRONCHOSCOPY Bilateral 12/16/2014   Procedure: VIDEO BRONCHOSCOPY WITH FLUORO;  Surgeon: Denson Flake, MD;  Location: [facility] END[facility];  Service: Cardiopulmonary;  Laterality: Bilateral;   VIDEO BRONCHOSCOPY N/A 01/14/2015   Procedure: VIDEO BRONCHOSCOPY;  Surgeon: Norita Beauvais, MD;  Location: [facility] OR;  Service: Thoracic;  Laterality: N/A;    REVIEW OF SYSTEMS:  A comprehensive review of systems was negative.   LABORATORY DATA: Lab Results  Component Value Date   WBC 10.0 06/20/2023   HGB 14.7 06/20/2023   HCT 42.6 06/20/2023   MCV 93.6 06/20/2023   PLT 297 06/20/2023      Chemistry      Component Value Date/Time   NA 137 06/20/2023 0903   NA 137 01/12/2017 0846   K 4.3 06/20/2023 0903   K 4.4 01/12/2017 0846   CL 105 06/20/2023 0903   CO2  22 06/20/2023 0903   CO2 18 (L) 01/12/2017 0846   BUN 19 06/20/2023 0903   BUN 15.3 01/12/2017 0846   CREATININE 0.92 06/20/2023 0903   CREATININE 1.0 01/12/2017 0846      Component Value Date/Time   CALCIUM 9.2 06/20/2023 0903   CALCIUM 9.0 01/12/2017 0846   ALKPHOS 78 06/20/2023 0903   ALKPHOS 99 01/12/2017 0846   AST 22 06/20/2023 0903   AST 23 01/12/2017 0846   ALT 19 06/20/2023 0903   ALT 22 01/12/2017 0846   BILITOT 0.4 06/20/2023 0903   BILITOT 0.41 01/12/2017 0846       RADIOGRAPHIC STUDIES: CT Chest W Contrast Result Date: 06/24/2023 EXAM: CT CHEST, ABDOMEN AND PELVIS WITH CONTRAST 06/20/2023 10:47:46 AM TECHNIQUE: CT of the chest, abdomen and pelvis was performed with the administration of intravenous contrast. Multiplanar reformatted images are  provided for review. Automated exposure control, iterative reconstruction, and/or weight based adjustment of the mA/kV was utilized to reduce the radiation dose to as low as reasonably achievable. COMPARISON: 06/30/2022 CLINICAL HISTORY: Non-small cell lung cancer (NSCLC), staging. Stage IV (T2a, N1, M1b) non-small cell lung cancer, adenocarcinoma, negative EGFR mutation but equivocal EGFR amplification, negative ALK gene translocation and negative ROS 1 but with PDL-1 expression 100% presented with left lower lobe lung mass in addition to left hilar adenopathy and solitary metastatic brain lesion diagnosed in December 2016. Nonocclusive right upper lobe pulmonary artery embolism diagnosed on incidental CT scan of the chest on 06/04/2019. Status post stereotactic radiotherapy and surgical resection diagnosed in December 2016. Status post video bronchoscopy with left VATS and wedge resection of the left upper lobe and left lower lobe superior segmentectomy with lymph node dissection under the care of Dr. Nicanor Barge on 01/14/2015. Immunotherapy with Ketruda. Stereotactic left frontal craniotomy for resection of tumor and the final pathology was consistent with tumor necrosis. This was done on 05/12/2017. Xarelto  15 mg p.o. twice daily for 3 weeks followed by 20 mg p.o. daily. Current therapy: Observation. FINDINGS: CHEST: MEDIASTINUM: Heart and pericardium are unremarkable. The central airways are clear. THORACIC LYMPH NODES: No mediastinal, hilar or axillary lymphadenopathy. LUNGS AND PLEURA: No focal consolidation or pulmonary edema. No pleural effusion or pneumothorax. Status post superior segment left lower lobe wedge resection. Mild bilateral lower lobe scarring. No suspicious pulmonary nodules. Moderate centrilobular and paraseptal emphysematous changes, upper lung predominant. ABDOMEN AND PELVIS: LIVER: The liver is unremarkable. GALLBLADDER AND BILE DUCTS: Gallbladder is unremarkable. No biliary ductal dilatation.  SPLEEN: No acute abnormality. PANCREAS: No acute abnormality. ADRENAL GLANDS: No acute abnormality. KIDNEYS, URETERS AND BLADDER: No stones in the kidneys or ureters. No hydronephrosis. No perinephric or periureteral stranding. Urinary bladder is unremarkable. Simple 13 mm right upper pole renal cyst (image 70), benign (Bosniak I). No follow-up is recommended. GI AND BOWEL: Stomach demonstrates no acute abnormality. There is no bowel obstruction. Sigmoid diverticulosis, without evidence of diverticulitis. Normal appendix (image 94). REPRODUCTIVE ORGANS: No acute abnormality. PERITONEUM AND RETROPERITONEUM: No ascites. No free air. VASCULATURE: Aorta is normal in caliber. Atherosclerotic calcifications of the abdominal aorta and branch vessels, although patent. ABDOMINAL AND PELVIS LYMPH NODES: No lymphadenopathy. REPRODUCTIVE ORGANS: No acute abnormality. BONES AND SOFT TISSUES: No acute osseous abnormality. No focal soft tissue abnormality. Tiny fat-containing left inguinal hernia. Old right posterior ninth rib fracture deformity. Degenerative changes at L5-S1. IMPRESSION: 1. Status post left lower lobe wedge resection. 2. No evidence of recurrent or metastatic disease in the chest, abdomen, or pelvis. Electronically signed by: Sriyesh Krishnan  MD 06/24/2023 01:43 AM EDT RP Workstation: [id number]   CT Abdomen Pelvis W Contrast Result Date: 06/24/2023 EXAM: CT CHEST, ABDOMEN AND PELVIS WITH CONTRAST 06/20/2023 10:47:46 AM TECHNIQUE: CT of the chest, abdomen and pelvis was performed with the administration of intravenous contrast. Multiplanar reformatted images are provided for review. Automated exposure control, iterative reconstruction, and/or weight based adjustment of the mA/kV was utilized to reduce the radiation dose to as low as reasonably achievable. COMPARISON: 06/30/2022 CLINICAL HISTORY: Non-small cell lung cancer (NSCLC), staging. Stage IV (T2a, N1, M1b) non-small cell lung cancer, adenocarcinoma,  negative EGFR mutation but equivocal EGFR amplification, negative ALK gene translocation and negative ROS 1 but with PDL-1 expression 100% presented with left lower lobe lung mass in addition to left hilar adenopathy and solitary metastatic brain lesion diagnosed in December 2016. Nonocclusive right upper lobe pulmonary artery embolism diagnosed on incidental CT scan of the chest on 06/04/2019. Status post stereotactic radiotherapy and surgical resection diagnosed in December 2016. Status post video bronchoscopy with left VATS and wedge resection of the left upper lobe and left lower lobe superior segmentectomy with lymph node dissection under the care of Dr. Nicanor Barge on 01/14/2015. Immunotherapy with Ketruda. Stereotactic left frontal craniotomy for resection of tumor and the final pathology was consistent with tumor necrosis. This was done on 05/12/2017. Xarelto  15 mg p.o. twice daily for 3 weeks followed by 20 mg p.o. daily. Current therapy: Observation. FINDINGS: CHEST: MEDIASTINUM: Heart and pericardium are unremarkable. The central airways are clear. THORACIC LYMPH NODES: No mediastinal, hilar or axillary lymphadenopathy. LUNGS AND PLEURA: No focal consolidation or pulmonary edema. No pleural effusion or pneumothorax. Status post superior segment left lower lobe wedge resection. Mild bilateral lower lobe scarring. No suspicious pulmonary nodules. Moderate centrilobular and paraseptal emphysematous changes, upper lung predominant. ABDOMEN AND PELVIS: LIVER: The liver is unremarkable. GALLBLADDER AND BILE DUCTS: Gallbladder is unremarkable. No biliary ductal dilatation. SPLEEN: No acute abnormality. PANCREAS: No acute abnormality. ADRENAL GLANDS: No acute abnormality. KIDNEYS, URETERS AND BLADDER: No stones in the kidneys or ureters. No hydronephrosis. No perinephric or periureteral stranding. Urinary bladder is unremarkable. Simple 13 mm right upper pole renal cyst (image 70), benign (Bosniak I). No follow-up is  recommended. GI AND BOWEL: Stomach demonstrates no acute abnormality. There is no bowel obstruction. Sigmoid diverticulosis, without evidence of diverticulitis. Normal appendix (image 94). REPRODUCTIVE ORGANS: No acute abnormality. PERITONEUM AND RETROPERITONEUM: No ascites. No free air. VASCULATURE: Aorta is normal in caliber. Atherosclerotic calcifications of the abdominal aorta and branch vessels, although patent. ABDOMINAL AND PELVIS LYMPH NODES: No lymphadenopathy. REPRODUCTIVE ORGANS: No acute abnormality. BONES AND SOFT TISSUES: No acute osseous abnormality. No focal soft tissue abnormality. Tiny fat-containing left inguinal hernia. Old right posterior ninth rib fracture deformity. Degenerative changes at L5-S1. IMPRESSION: 1. Status post left lower lobe wedge resection. 2. No evidence of recurrent or metastatic disease in the chest, abdomen, or pelvis. Electronically signed by: Zadie Herter MD 06/24/2023 01:43 AM EDT RP Workstation: [id number]   MR BRAIN W WO CONTRAST Result Date: 06/22/2023 CLINICAL DATA:  Brain/CNS neoplasm, assess treatment response. Metastatic lung cancer. EXAM: MRI HEAD WITHOUT AND WITH CONTRAST TECHNIQUE: Multiplanar, multiecho pulse sequences of the brain and surrounding structures were obtained without and with intravenous contrast. CONTRAST:  8 mL Vueway  COMPARISON:  Head MRI 06/14/2022 FINDINGS: Brain: The resection cavity in the high posterior left frontal lobe is unchanged in appearance, with both curvilinear enhancement as well as more rounded enhancement inferiorly measuring up to 5 mm on axial images  again noted and being unchanged. Mild surrounding T2 hyperintensity is unchanged. No new enhancing intracranial lesion, acute infarct, midline shift, hydrocephalus, or extra-axial fluid collection is evident. There is mild cerebral atrophy. Vascular: Major intracranial vascular flow voids are preserved. Skull and upper cervical spine: Left frontoparietal craniotomy. No  suspicious marrow lesion. Sinuses/Orbits: Unremarkable orbits. Small mucous retention cyst in the right maxillary sinus. Clear mastoid air cells. Other: None. IMPRESSION: Stable post treatment changes in the left frontal lobe. No evidence of new intracranial metastases. Electronically Signed   By: Aundra Lee M.D.   On: 06/22/2023 15:26    ASSESSMENT AND PLAN:  This is a very pleasant 63 years old white male with metastatic non-small cell lung cancer, adenocarcinoma with positive PDL 1 expression of 100%.  This was diagnosed in December 2016 and presented with left lower lobe lung mass in addition to left hilar adenopathy and solitary brain metastasis. He completed treatment with Ketruda (pembrolizumab ) 200 mg IV every 3 weeks, status post 35 cycles, a total of 2 years. The patient has been on observation since that time and he is feeling fine with no concerning complaints. He had repeat CT scan of the chest, abdomen and pelvis performed recently.  I personally and independently reviewed the scan and discussed the result with the patient today.  His scan showed no concerning findings for disease recurrence or metastasis. Assessment and Plan    Stage 4 non-small cell lung cancer Stage 4 non-small cell lung cancer, adenocarcinoma with positive PD-L1 expression of 100% and negative actionable mutations. Diagnosed in December 2016. Status post SRS and surgical resection for brain metastasis, followed by two years of immunotherapy with Keytruda , completed in January 2019. Currently no evidence of recurrent or metastatic disease in recent chest, abdomen, and pelvis scans. - Arrange follow-up in one year to monitor for recurrence or metastasis.  Brain metastasis Treated with SRS and surgical resection.  Allergic reaction to contrast Previous allergic reaction to contrast. No allergic reaction occurred with recent imaging after premedication with prednisone . - Continue premedication with prednisone  for  future contrast studies.   The patient was advised to call immediately if he has any concerning symptoms in the interval. I discussed the assessment and treatment plan with the patient. The patient was provided an opportunity to ask questions and all were answered. The patient agreed with the plan and demonstrated an understanding of the instructions.   The patient was advised to call back or seek an in-person evaluation if the symptoms worsen or if the condition fails to improve as anticipated.  I provided 20 minutes of face-to-face video visit time during this encounter, and > 50% was spent counseling as documented under my assessment & plan.  Aurelio Blower, MD 06/29/2023 1:38 PM  Disclaimer: This note was dictated with voice recognition software. Similar sounding words can inadvertently be transcribed and may not be corrected upon review.

## 2023-07-04 ENCOUNTER — Inpatient Hospital Stay (HOSPITAL_BASED_OUTPATIENT_CLINIC_OR_DEPARTMENT_OTHER): Admitting: Internal Medicine

## 2023-07-04 ENCOUNTER — Ambulatory Visit: Payer: Medicare Other | Admitting: Internal Medicine

## 2023-07-04 DIAGNOSIS — C7931 Secondary malignant neoplasm of brain: Secondary | ICD-10-CM | POA: Diagnosis not present

## 2023-07-04 NOTE — Progress Notes (Signed)
 I connected with Timothy Obrien on 07/04/23 at 10:30 AM EDT by telephone visit and verified that I am speaking with the correct person using two identifiers.  I discussed the limitations, risks, security and privacy concerns of performing an evaluation and management service by telemedicine and the availability of in-person appointments. I also discussed with the patient that there may be a patient responsible charge related to this service. The patient expressed understanding and agreed to proceed.  Other persons participating in the visit and their role in the encounter:  n/a   Patient's location:  Home Provider's location:  Office Chief Complaint:  Malignant neoplasm metastatic to brain [facility])  History of Present Ilness: Timothy Obrien reports no clinical changes today.  He has not been working with PT though he is motivated to re-engage.  He is doing some walking and mild exercise on his own.    Observations: Language and cognition at baseline  Imaging:  [facility] Clinician Interpretation: I have personally reviewed the CNS images as listed.  My interpretation, in the context of the patient's clinical presentation, is stable disease  CT Chest W Contrast Result Date: 06/24/2023 EXAM: CT CHEST, ABDOMEN AND PELVIS WITH CONTRAST 06/20/2023 10:47:46 AM TECHNIQUE: CT of the chest, abdomen and pelvis was performed with the administration of intravenous contrast. Multiplanar reformatted images are provided for review. Automated exposure control, iterative reconstruction, and/or weight based adjustment of the mA/kV was utilized to reduce the radiation dose to as low as reasonably achievable. COMPARISON: 06/30/2022 CLINICAL HISTORY: Non-small cell lung cancer (NSCLC), staging. Stage IV (T2a, N1, M1b) non-small cell lung cancer, adenocarcinoma, negative EGFR mutation but equivocal EGFR amplification, negative ALK gene translocation and negative ROS 1 but with PDL-1 expression 100% presented with left lower  lobe lung mass in addition to left hilar adenopathy and solitary metastatic brain lesion diagnosed in December 2016. Nonocclusive right upper lobe pulmonary artery embolism diagnosed on incidental CT scan of the chest on 06/04/2019. Status post stereotactic radiotherapy and surgical resection diagnosed in December 2016. Status post video bronchoscopy with left VATS and wedge resection of the left upper lobe and left lower lobe superior segmentectomy with lymph node dissection under the care of Dr. Army on 01/14/2015. Immunotherapy with Ketruda. Stereotactic left frontal craniotomy for resection of tumor and the final pathology was consistent with tumor necrosis. This was done on 05/12/2017. Xarelto  15 mg p.o. twice daily for 3 weeks followed by 20 mg p.o. daily. Current therapy: Observation. FINDINGS: CHEST: MEDIASTINUM: Heart and pericardium are unremarkable. The central airways are clear. THORACIC LYMPH NODES: No mediastinal, hilar or axillary lymphadenopathy. LUNGS AND PLEURA: No focal consolidation or pulmonary edema. No pleural effusion or pneumothorax. Status post superior segment left lower lobe wedge resection. Mild bilateral lower lobe scarring. No suspicious pulmonary nodules. Moderate centrilobular and paraseptal emphysematous changes, upper lung predominant. ABDOMEN AND PELVIS: LIVER: The liver is unremarkable. GALLBLADDER AND BILE DUCTS: Gallbladder is unremarkable. No biliary ductal dilatation. SPLEEN: No acute abnormality. PANCREAS: No acute abnormality. ADRENAL GLANDS: No acute abnormality. KIDNEYS, URETERS AND BLADDER: No stones in the kidneys or ureters. No hydronephrosis. No perinephric or periureteral stranding. Urinary bladder is unremarkable. Simple 13 mm right upper pole renal cyst (image 70), benign (Bosniak I). No follow-up is recommended. GI AND BOWEL: Stomach demonstrates no acute abnormality. There is no bowel obstruction. Sigmoid diverticulosis, without evidence of diverticulitis.  Normal appendix (image 94). REPRODUCTIVE ORGANS: No acute abnormality. PERITONEUM AND RETROPERITONEUM: No ascites. No free air. VASCULATURE: Aorta is normal in  caliber. Atherosclerotic calcifications of the abdominal aorta and branch vessels, although patent. ABDOMINAL AND PELVIS LYMPH NODES: No lymphadenopathy. REPRODUCTIVE ORGANS: No acute abnormality. BONES AND SOFT TISSUES: No acute osseous abnormality. No focal soft tissue abnormality. Tiny fat-containing left inguinal hernia. Old right posterior ninth rib fracture deformity. Degenerative changes at L5-S1. IMPRESSION: 1. Status post left lower lobe wedge resection. 2. No evidence of recurrent or metastatic disease in the chest, abdomen, or pelvis. Electronically signed by: Pinkie Pebbles MD 06/24/2023 01:43 AM EDT RP Workstation: [id number]   CT Abdomen Pelvis W Contrast Result Date: 06/24/2023 EXAM: CT CHEST, ABDOMEN AND PELVIS WITH CONTRAST 06/20/2023 10:47:46 AM TECHNIQUE: CT of the chest, abdomen and pelvis was performed with the administration of intravenous contrast. Multiplanar reformatted images are provided for review. Automated exposure control, iterative reconstruction, and/or weight based adjustment of the mA/kV was utilized to reduce the radiation dose to as low as reasonably achievable. COMPARISON: 06/30/2022 CLINICAL HISTORY: Non-small cell lung cancer (NSCLC), staging. Stage IV (T2a, N1, M1b) non-small cell lung cancer, adenocarcinoma, negative EGFR mutation but equivocal EGFR amplification, negative ALK gene translocation and negative ROS 1 but with PDL-1 expression 100% presented with left lower lobe lung mass in addition to left hilar adenopathy and solitary metastatic brain lesion diagnosed in December 2016. Nonocclusive right upper lobe pulmonary artery embolism diagnosed on incidental CT scan of the chest on 06/04/2019. Status post stereotactic radiotherapy and surgical resection diagnosed in December 2016. Status post video  bronchoscopy with left VATS and wedge resection of the left upper lobe and left lower lobe superior segmentectomy with lymph node dissection under the care of Dr. Army on 01/14/2015. Immunotherapy with Ketruda. Stereotactic left frontal craniotomy for resection of tumor and the final pathology was consistent with tumor necrosis. This was done on 05/12/2017. Xarelto  15 mg p.o. twice daily for 3 weeks followed by 20 mg p.o. daily. Current therapy: Observation. FINDINGS: CHEST: MEDIASTINUM: Heart and pericardium are unremarkable. The central airways are clear. THORACIC LYMPH NODES: No mediastinal, hilar or axillary lymphadenopathy. LUNGS AND PLEURA: No focal consolidation or pulmonary edema. No pleural effusion or pneumothorax. Status post superior segment left lower lobe wedge resection. Mild bilateral lower lobe scarring. No suspicious pulmonary nodules. Moderate centrilobular and paraseptal emphysematous changes, upper lung predominant. ABDOMEN AND PELVIS: LIVER: The liver is unremarkable. GALLBLADDER AND BILE DUCTS: Gallbladder is unremarkable. No biliary ductal dilatation. SPLEEN: No acute abnormality. PANCREAS: No acute abnormality. ADRENAL GLANDS: No acute abnormality. KIDNEYS, URETERS AND BLADDER: No stones in the kidneys or ureters. No hydronephrosis. No perinephric or periureteral stranding. Urinary bladder is unremarkable. Simple 13 mm right upper pole renal cyst (image 70), benign (Bosniak I). No follow-up is recommended. GI AND BOWEL: Stomach demonstrates no acute abnormality. There is no bowel obstruction. Sigmoid diverticulosis, without evidence of diverticulitis. Normal appendix (image 94). REPRODUCTIVE ORGANS: No acute abnormality. PERITONEUM AND RETROPERITONEUM: No ascites. No free air. VASCULATURE: Aorta is normal in caliber. Atherosclerotic calcifications of the abdominal aorta and branch vessels, although patent. ABDOMINAL AND PELVIS LYMPH NODES: No lymphadenopathy. REPRODUCTIVE ORGANS: No acute  abnormality. BONES AND SOFT TISSUES: No acute osseous abnormality. No focal soft tissue abnormality. Tiny fat-containing left inguinal hernia. Old right posterior ninth rib fracture deformity. Degenerative changes at L5-S1. IMPRESSION: 1. Status post left lower lobe wedge resection. 2. No evidence of recurrent or metastatic disease in the chest, abdomen, or pelvis. Electronically signed by: Pinkie Pebbles MD 06/24/2023 01:43 AM EDT RP Workstation: [id number]   MR BRAIN W WO CONTRAST Result Date: 06/22/2023  CLINICAL DATA:  Brain/CNS neoplasm, assess treatment response. Metastatic lung cancer. EXAM: MRI HEAD WITHOUT AND WITH CONTRAST TECHNIQUE: Multiplanar, multiecho pulse sequences of the brain and surrounding structures were obtained without and with intravenous contrast. CONTRAST:  8 mL Vueway  COMPARISON:  Head MRI 06/14/2022 FINDINGS: Brain: The resection cavity in the high posterior left frontal lobe is unchanged in appearance, with both curvilinear enhancement as well as more rounded enhancement inferiorly measuring up to 5 mm on axial images again noted and being unchanged. Mild surrounding T2 hyperintensity is unchanged. No new enhancing intracranial lesion, acute infarct, midline shift, hydrocephalus, or extra-axial fluid collection is evident. There is mild cerebral atrophy. Vascular: Major intracranial vascular flow voids are preserved. Skull and upper cervical spine: Left frontoparietal craniotomy. No suspicious marrow lesion. Sinuses/Orbits: Unremarkable orbits. Small mucous retention cyst in the right maxillary sinus. Clear mastoid air cells. Other: None. IMPRESSION: Stable post treatment changes in the left frontal lobe. No evidence of new intracranial metastases. Electronically Signed   By: Dasie Hamburg M.D.   On: 06/22/2023 15:26   Assessment and Plan: Malignant neoplasm metastatic to brain [facility])  Clinically stable from CNS standpoint, no concerning changes.  Follow Up Instructions: We ask  that Timothy Obrien return to clinic in 18 months following next brain MRI, or sooner as needed.  I discussed the assessment and treatment plan with the patient.  The patient was provided an opportunity to ask questions and all were answered.  The patient agreed with the plan and demonstrated understanding of the instructions.    The patient was advised to call back or seek an in-person evaluation if the symptoms worsen or if the condition fails to improve as anticipated.    Timothy Enck K Gulianna Hornsby, MD   I provided 20 minutes of non face-to-face telephone visit time during this encounter, and > 50% was spent counseling as documented under my assessment & plan.

## 2023-12-06 ENCOUNTER — Telehealth: Payer: Self-pay

## 2023-12-06 ENCOUNTER — Other Ambulatory Visit: Payer: Self-pay | Admitting: Internal Medicine

## 2023-12-06 NOTE — Telephone Encounter (Signed)
 Sertraline  25 mg tablets refilled per OV note (09/03/22).

## 2024-01-23 NOTE — Telephone Encounter (Signed)
 left message regarding counseling options
